# Patient Record
Sex: Female | Born: 1935 | Race: White | Hispanic: No | Marital: Married | Smoking: Never smoker
Health system: Southern US, Community
[De-identification: ages and names within clinical notes are randomized; demographics above are authoritative.]

## PROBLEM LIST (undated history)

## (undated) DIAGNOSIS — I341 Nonrheumatic mitral (valve) prolapse: Secondary | ICD-10-CM

## (undated) DIAGNOSIS — I499 Cardiac arrhythmia, unspecified: Secondary | ICD-10-CM

## (undated) DIAGNOSIS — E785 Hyperlipidemia, unspecified: Secondary | ICD-10-CM

## (undated) DIAGNOSIS — K5790 Diverticulosis of intestine, part unspecified, without perforation or abscess without bleeding: Secondary | ICD-10-CM

## (undated) DIAGNOSIS — K5792 Diverticulitis of intestine, part unspecified, without perforation or abscess without bleeding: Secondary | ICD-10-CM

## (undated) DIAGNOSIS — M1990 Unspecified osteoarthritis, unspecified site: Secondary | ICD-10-CM

## (undated) DIAGNOSIS — I480 Paroxysmal atrial fibrillation: Secondary | ICD-10-CM

## (undated) HISTORY — PX: CATARACT EXTRACTION, BILATERAL: SHX1313

## (undated) HISTORY — DX: Paroxysmal atrial fibrillation: I48.0

## (undated) HISTORY — PX: TRANSTHORACIC ECHOCARDIOGRAM: SHX275

---

## 1993-01-04 DIAGNOSIS — K5792 Diverticulitis of intestine, part unspecified, without perforation or abscess without bleeding: Secondary | ICD-10-CM

## 1993-01-04 HISTORY — DX: Diverticulitis of intestine, part unspecified, without perforation or abscess without bleeding: K57.92

## 1997-07-19 ENCOUNTER — Encounter: Admission: RE | Admit: 1997-07-19 | Discharge: 1997-10-17 | Payer: Self-pay | Admitting: Orthopaedic Surgery

## 1997-10-23 ENCOUNTER — Encounter: Admission: RE | Admit: 1997-10-23 | Discharge: 1998-01-21 | Payer: Self-pay | Admitting: Orthopaedic Surgery

## 1998-05-16 ENCOUNTER — Other Ambulatory Visit: Admission: RE | Admit: 1998-05-16 | Discharge: 1998-05-16 | Payer: Self-pay | Admitting: *Deleted

## 1999-05-19 ENCOUNTER — Other Ambulatory Visit: Admission: RE | Admit: 1999-05-19 | Discharge: 1999-05-19 | Payer: Self-pay | Admitting: *Deleted

## 2001-01-16 ENCOUNTER — Other Ambulatory Visit: Admission: RE | Admit: 2001-01-16 | Discharge: 2001-01-16 | Payer: Self-pay

## 2001-01-18 ENCOUNTER — Encounter: Admission: RE | Admit: 2001-01-18 | Discharge: 2001-01-18 | Payer: Self-pay

## 2001-03-08 ENCOUNTER — Encounter: Admission: RE | Admit: 2001-03-08 | Discharge: 2001-03-08 | Payer: Self-pay | Admitting: Obstetrics and Gynecology

## 2001-03-08 ENCOUNTER — Encounter: Payer: Self-pay | Admitting: Obstetrics and Gynecology

## 2003-01-07 ENCOUNTER — Emergency Department (HOSPITAL_COMMUNITY): Admission: EM | Admit: 2003-01-07 | Discharge: 2003-01-07 | Payer: Self-pay | Admitting: Family Medicine

## 2003-03-28 ENCOUNTER — Emergency Department (HOSPITAL_COMMUNITY): Admission: AD | Admit: 2003-03-28 | Discharge: 2003-03-28 | Payer: Self-pay | Admitting: Family Medicine

## 2003-09-17 ENCOUNTER — Other Ambulatory Visit: Admission: RE | Admit: 2003-09-17 | Discharge: 2003-09-17 | Payer: Self-pay | Admitting: Family

## 2004-01-26 ENCOUNTER — Emergency Department (HOSPITAL_COMMUNITY): Admission: EM | Admit: 2004-01-26 | Discharge: 2004-01-26 | Payer: Self-pay | Admitting: Family Medicine

## 2004-03-01 ENCOUNTER — Emergency Department (HOSPITAL_COMMUNITY): Admission: EM | Admit: 2004-03-01 | Discharge: 2004-03-01 | Payer: Self-pay | Admitting: Family Medicine

## 2004-07-31 ENCOUNTER — Emergency Department (HOSPITAL_COMMUNITY): Admission: EM | Admit: 2004-07-31 | Discharge: 2004-07-31 | Payer: Self-pay | Admitting: Family Medicine

## 2004-12-27 ENCOUNTER — Emergency Department (HOSPITAL_COMMUNITY): Admission: EM | Admit: 2004-12-27 | Discharge: 2004-12-27 | Payer: Self-pay | Admitting: Emergency Medicine

## 2005-01-30 ENCOUNTER — Emergency Department (HOSPITAL_COMMUNITY): Admission: EM | Admit: 2005-01-30 | Discharge: 2005-01-30 | Payer: Self-pay | Admitting: Emergency Medicine

## 2005-05-15 ENCOUNTER — Emergency Department (HOSPITAL_COMMUNITY): Admission: EM | Admit: 2005-05-15 | Discharge: 2005-05-15 | Payer: Self-pay | Admitting: Family Medicine

## 2007-04-30 ENCOUNTER — Emergency Department (HOSPITAL_COMMUNITY): Admission: EM | Admit: 2007-04-30 | Discharge: 2007-04-30 | Payer: Self-pay | Admitting: Family Medicine

## 2008-05-02 ENCOUNTER — Emergency Department (HOSPITAL_COMMUNITY): Admission: EM | Admit: 2008-05-02 | Discharge: 2008-05-02 | Payer: Self-pay | Admitting: Emergency Medicine

## 2008-07-21 ENCOUNTER — Emergency Department (HOSPITAL_COMMUNITY): Admission: EM | Admit: 2008-07-21 | Discharge: 2008-07-21 | Payer: Self-pay | Admitting: Emergency Medicine

## 2008-11-29 ENCOUNTER — Emergency Department (HOSPITAL_COMMUNITY): Admission: EM | Admit: 2008-11-29 | Discharge: 2008-11-29 | Payer: Self-pay | Admitting: Family Medicine

## 2010-04-07 ENCOUNTER — Encounter (HOSPITAL_COMMUNITY): Payer: Self-pay | Admitting: Radiology

## 2010-04-07 ENCOUNTER — Emergency Department (HOSPITAL_COMMUNITY)
Admission: EM | Admit: 2010-04-07 | Discharge: 2010-04-07 | Disposition: A | Discharge status: Home / Self Care | Payer: Medicare Other | Attending: Emergency Medicine | Admitting: Emergency Medicine

## 2010-04-07 ENCOUNTER — Emergency Department (HOSPITAL_COMMUNITY): Payer: Medicare Other

## 2010-04-07 DIAGNOSIS — N200 Calculus of kidney: Secondary | ICD-10-CM | POA: Insufficient documentation

## 2010-04-07 DIAGNOSIS — E7800 Pure hypercholesterolemia, unspecified: Secondary | ICD-10-CM | POA: Insufficient documentation

## 2010-04-07 DIAGNOSIS — R109 Unspecified abdominal pain: Secondary | ICD-10-CM | POA: Insufficient documentation

## 2010-04-07 HISTORY — DX: Diverticulitis of intestine, part unspecified, without perforation or abscess without bleeding: K57.92

## 2010-04-07 LAB — URINE MICROSCOPIC-ADD ON

## 2010-04-07 LAB — URINALYSIS, ROUTINE W REFLEX MICROSCOPIC
Leukocytes, UA: NEGATIVE
Nitrite: NEGATIVE
Specific Gravity, Urine: 1.018 (ref 1.005–1.030)
Urobilinogen, UA: 0.2 mg/dL (ref 0.0–1.0)

## 2010-04-08 LAB — URINE CULTURE
Colony Count: 25000
Culture  Setup Time: 201204030936

## 2010-04-12 LAB — POCT URINALYSIS DIP (DEVICE)
Glucose, UA: NEGATIVE mg/dL
Nitrite: NEGATIVE
Urobilinogen, UA: 0.2 mg/dL (ref 0.0–1.0)

## 2010-04-15 LAB — COMPREHENSIVE METABOLIC PANEL
AST: 33 U/L (ref 0–37)
Albumin: 3.6 g/dL (ref 3.5–5.2)
Calcium: 9.7 mg/dL (ref 8.4–10.5)
Chloride: 105 mEq/L (ref 96–112)
Creatinine, Ser: 0.78 mg/dL (ref 0.4–1.2)
GFR calc Af Amer: >60 mL/min (ref >60)
Total Protein: 6 g/dL (ref 6.0–8.3)

## 2010-04-15 LAB — DIFFERENTIAL
Eosinophils Relative: 1 % (ref 0–5)
Lymphocytes Relative: 14 % (ref 12–46)
Lymphs Abs: 0.9 10*3/uL (ref 0.7–4.0)
Monocytes Absolute: 0.6 10*3/uL (ref 0.1–1.0)
Monocytes Relative: 9 % (ref 3–12)

## 2010-04-15 LAB — CBC
MCV: 93 fL (ref 78.0–100.0)
Platelets: 178 10*3/uL (ref 150–400)
WBC: 6.5 10*3/uL (ref 4.0–10.5)

## 2010-04-15 LAB — URINALYSIS, ROUTINE W REFLEX MICROSCOPIC
Hgb urine dipstick: NEGATIVE
Protein, ur: NEGATIVE mg/dL
Urobilinogen, UA: 0.2 mg/dL (ref 0.0–1.0)

## 2011-01-11 DIAGNOSIS — E559 Vitamin D deficiency, unspecified: Secondary | ICD-10-CM | POA: Diagnosis not present

## 2011-01-11 DIAGNOSIS — Z0000 Encounter for general adult medical examination without abnormal findings: Secondary | ICD-10-CM | POA: Diagnosis not present

## 2011-01-11 DIAGNOSIS — E785 Hyperlipidemia, unspecified: Secondary | ICD-10-CM | POA: Diagnosis not present

## 2011-01-11 DIAGNOSIS — Z1331 Encounter for screening for depression: Secondary | ICD-10-CM | POA: Diagnosis not present

## 2011-03-24 DIAGNOSIS — L0291 Cutaneous abscess, unspecified: Secondary | ICD-10-CM | POA: Diagnosis not present

## 2011-04-21 DIAGNOSIS — H04129 Dry eye syndrome of unspecified lacrimal gland: Secondary | ICD-10-CM | POA: Diagnosis not present

## 2011-04-21 DIAGNOSIS — H43819 Vitreous degeneration, unspecified eye: Secondary | ICD-10-CM | POA: Diagnosis not present

## 2011-04-21 DIAGNOSIS — H259 Unspecified age-related cataract: Secondary | ICD-10-CM | POA: Diagnosis not present

## 2011-04-21 DIAGNOSIS — H02059 Trichiasis without entropian unspecified eye, unspecified eyelid: Secondary | ICD-10-CM | POA: Diagnosis not present

## 2011-06-15 DIAGNOSIS — Z1231 Encounter for screening mammogram for malignant neoplasm of breast: Secondary | ICD-10-CM | POA: Diagnosis not present

## 2011-06-17 DIAGNOSIS — R928 Other abnormal and inconclusive findings on diagnostic imaging of breast: Secondary | ICD-10-CM | POA: Diagnosis not present

## 2011-08-20 DIAGNOSIS — S92309A Fracture of unspecified metatarsal bone(s), unspecified foot, initial encounter for closed fracture: Secondary | ICD-10-CM | POA: Diagnosis not present

## 2011-08-27 DIAGNOSIS — S92309A Fracture of unspecified metatarsal bone(s), unspecified foot, initial encounter for closed fracture: Secondary | ICD-10-CM | POA: Diagnosis not present

## 2011-09-17 DIAGNOSIS — S92309A Fracture of unspecified metatarsal bone(s), unspecified foot, initial encounter for closed fracture: Secondary | ICD-10-CM | POA: Diagnosis not present

## 2011-09-23 DIAGNOSIS — Z23 Encounter for immunization: Secondary | ICD-10-CM | POA: Diagnosis not present

## 2011-10-12 DIAGNOSIS — S92309A Fracture of unspecified metatarsal bone(s), unspecified foot, initial encounter for closed fracture: Secondary | ICD-10-CM | POA: Diagnosis not present

## 2011-11-15 DIAGNOSIS — S92309A Fracture of unspecified metatarsal bone(s), unspecified foot, initial encounter for closed fracture: Secondary | ICD-10-CM | POA: Diagnosis not present

## 2012-01-10 DIAGNOSIS — N6009 Solitary cyst of unspecified breast: Secondary | ICD-10-CM | POA: Diagnosis not present

## 2012-01-17 DIAGNOSIS — E559 Vitamin D deficiency, unspecified: Secondary | ICD-10-CM | POA: Diagnosis not present

## 2012-01-17 DIAGNOSIS — H259 Unspecified age-related cataract: Secondary | ICD-10-CM | POA: Diagnosis not present

## 2012-01-17 DIAGNOSIS — M810 Age-related osteoporosis without current pathological fracture: Secondary | ICD-10-CM | POA: Diagnosis not present

## 2012-01-17 DIAGNOSIS — H52209 Unspecified astigmatism, unspecified eye: Secondary | ICD-10-CM | POA: Diagnosis not present

## 2012-01-17 DIAGNOSIS — H04129 Dry eye syndrome of unspecified lacrimal gland: Secondary | ICD-10-CM | POA: Diagnosis not present

## 2012-01-17 DIAGNOSIS — H43819 Vitreous degeneration, unspecified eye: Secondary | ICD-10-CM | POA: Diagnosis not present

## 2012-01-17 DIAGNOSIS — Z0000 Encounter for general adult medical examination without abnormal findings: Secondary | ICD-10-CM | POA: Diagnosis not present

## 2012-01-17 DIAGNOSIS — Z1331 Encounter for screening for depression: Secondary | ICD-10-CM | POA: Diagnosis not present

## 2012-01-17 DIAGNOSIS — E785 Hyperlipidemia, unspecified: Secondary | ICD-10-CM | POA: Diagnosis not present

## 2012-02-01 DIAGNOSIS — H25049 Posterior subcapsular polar age-related cataract, unspecified eye: Secondary | ICD-10-CM | POA: Diagnosis not present

## 2012-02-01 DIAGNOSIS — H269 Unspecified cataract: Secondary | ICD-10-CM | POA: Diagnosis not present

## 2012-02-01 DIAGNOSIS — H25019 Cortical age-related cataract, unspecified eye: Secondary | ICD-10-CM | POA: Diagnosis not present

## 2012-02-01 DIAGNOSIS — H2510 Age-related nuclear cataract, unspecified eye: Secondary | ICD-10-CM | POA: Diagnosis not present

## 2012-02-28 DIAGNOSIS — M810 Age-related osteoporosis without current pathological fracture: Secondary | ICD-10-CM | POA: Diagnosis not present

## 2012-03-07 DIAGNOSIS — H52209 Unspecified astigmatism, unspecified eye: Secondary | ICD-10-CM | POA: Diagnosis not present

## 2012-03-07 DIAGNOSIS — H269 Unspecified cataract: Secondary | ICD-10-CM | POA: Diagnosis not present

## 2012-03-07 DIAGNOSIS — H25049 Posterior subcapsular polar age-related cataract, unspecified eye: Secondary | ICD-10-CM | POA: Diagnosis not present

## 2012-03-07 DIAGNOSIS — H25039 Anterior subcapsular polar age-related cataract, unspecified eye: Secondary | ICD-10-CM | POA: Diagnosis not present

## 2012-03-07 DIAGNOSIS — H2510 Age-related nuclear cataract, unspecified eye: Secondary | ICD-10-CM | POA: Diagnosis not present

## 2012-04-04 DIAGNOSIS — M76899 Other specified enthesopathies of unspecified lower limb, excluding foot: Secondary | ICD-10-CM | POA: Diagnosis not present

## 2012-07-04 DIAGNOSIS — L821 Other seborrheic keratosis: Secondary | ICD-10-CM | POA: Diagnosis not present

## 2012-08-10 DIAGNOSIS — M47812 Spondylosis without myelopathy or radiculopathy, cervical region: Secondary | ICD-10-CM | POA: Diagnosis not present

## 2012-08-10 DIAGNOSIS — M542 Cervicalgia: Secondary | ICD-10-CM | POA: Diagnosis not present

## 2012-08-16 DIAGNOSIS — M47817 Spondylosis without myelopathy or radiculopathy, lumbosacral region: Secondary | ICD-10-CM | POA: Diagnosis not present

## 2012-08-16 DIAGNOSIS — M47812 Spondylosis without myelopathy or radiculopathy, cervical region: Secondary | ICD-10-CM | POA: Diagnosis not present

## 2012-08-21 ENCOUNTER — Encounter (HOSPITAL_COMMUNITY): Payer: Self-pay

## 2012-08-21 ENCOUNTER — Emergency Department (HOSPITAL_COMMUNITY)
Admission: EM | Admit: 2012-08-21 | Discharge: 2012-08-22 | Disposition: A | Discharge status: Home / Self Care | Payer: Medicare Other | Attending: Emergency Medicine | Admitting: Emergency Medicine

## 2012-08-21 DIAGNOSIS — R112 Nausea with vomiting, unspecified: Secondary | ICD-10-CM | POA: Diagnosis not present

## 2012-08-21 DIAGNOSIS — R197 Diarrhea, unspecified: Secondary | ICD-10-CM | POA: Insufficient documentation

## 2012-08-21 DIAGNOSIS — Z8719 Personal history of other diseases of the digestive system: Secondary | ICD-10-CM | POA: Insufficient documentation

## 2012-08-21 DIAGNOSIS — Z79899 Other long term (current) drug therapy: Secondary | ICD-10-CM | POA: Diagnosis not present

## 2012-08-21 DIAGNOSIS — R1110 Vomiting, unspecified: Secondary | ICD-10-CM

## 2012-08-21 DIAGNOSIS — R109 Unspecified abdominal pain: Secondary | ICD-10-CM | POA: Insufficient documentation

## 2012-08-21 DIAGNOSIS — Z7982 Long term (current) use of aspirin: Secondary | ICD-10-CM | POA: Insufficient documentation

## 2012-08-21 NOTE — ED Notes (Signed)
Pt complains of feeling nauseated around 730 and vomiting about 12 times since then and she states that she's had some diarrhea

## 2012-08-22 LAB — CBC WITH DIFFERENTIAL/PLATELET
Basophils Absolute: 0 10*3/uL (ref 0.0–0.1)
Basophils Relative: 0 % (ref 0–1)
Eosinophils Relative: 1 % (ref 0–5)
HCT: 41.4 % (ref 36.0–46.0)
MCHC: 34.3 g/dL (ref 30.0–36.0)
MCV: 93.5 fL (ref 78.0–100.0)
Monocytes Absolute: 0.5 10*3/uL (ref 0.1–1.0)
RDW: 12.8 % (ref 11.5–15.5)

## 2012-08-22 LAB — URINE MICROSCOPIC-ADD ON

## 2012-08-22 LAB — COMPREHENSIVE METABOLIC PANEL
AST: 36 U/L (ref 0–37)
Albumin: 3.8 g/dL (ref 3.5–5.2)
BUN: 28 mg/dL — ABNORMAL HIGH (ref 6–23)
CO2: 27 mEq/L (ref 19–32)
Calcium: 9.6 mg/dL (ref 8.4–10.5)
Creatinine, Ser: 0.83 mg/dL (ref 0.50–1.10)
GFR calc non Af Amer: 63 mL/min — ABNORMAL LOW (ref >90)

## 2012-08-22 LAB — URINALYSIS, ROUTINE W REFLEX MICROSCOPIC
Glucose, UA: NEGATIVE mg/dL
Nitrite: NEGATIVE
Protein, ur: NEGATIVE mg/dL

## 2012-08-22 LAB — LIPASE, BLOOD: Lipase: 84 U/L — ABNORMAL HIGH (ref 11–59)

## 2012-08-22 MED ORDER — DIPHENOXYLATE-ATROPINE 2.5-0.025 MG PO TABS
1.0000 | ORAL_TABLET | Freq: Once | ORAL | Status: AC
  Administered 2012-08-22: 1 via ORAL
  Filled 2012-08-22: qty 1

## 2012-08-22 MED ORDER — LOPERAMIDE HCL 2 MG PO CAPS: 2.0000 mg | ORAL_CAPSULE | Freq: Four times a day (QID) | ORAL | Status: DC | PRN

## 2012-08-22 MED ORDER — SODIUM CHLORIDE 0.9 % IV BOLUS (SEPSIS)
500.0000 mL | Freq: Once | INTRAVENOUS | Status: AC
  Administered 2012-08-22: 500 mL via INTRAVENOUS

## 2012-08-22 MED ORDER — ONDANSETRON HCL 4 MG/2ML IJ SOLN
4.0000 mg | Freq: Once | INTRAMUSCULAR | Status: AC
  Administered 2012-08-22: 4 mg via INTRAVENOUS
  Filled 2012-08-22: qty 2

## 2012-08-22 MED ORDER — ONDANSETRON HCL 4 MG PO TABS: 4.0000 mg | ORAL_TABLET | Freq: Four times a day (QID) | ORAL | Status: DC

## 2012-08-22 NOTE — ED Provider Notes (Signed)
CSN: [id number]     Arrival date & time 08/21/12  2336 History     First MD Initiated Contact with Patient 08/21/12 2354     Chief Complaint  Patient presents with  . Emesis   (Consider location/radiation/quality/duration/timing/severity/associated sxs/prior Treatment) HPI Comments: Patient comes to the ER for evaluation of nausea, vomiting and diarrhea. Patient reports onset of nausea and vomiting earlier today. She has vomited approximately 14 times. Patient also reports that she had onset of watery diarrhea this evening. Patient reports lower abdominal cramping prior to diarrhea, but the pain was relieved after the bowel movement. Currently no pain. She has not been able to eat or drink anything because of the nausea and vomiting. She has not had any fever.  Patient is a 79 y.o. female presenting with vomiting.  Emesis Associated symptoms: abdominal pain (resolved) and diarrhea     Past Medical History  Diagnosis Date  . Diverticulitis    History reviewed. No pertinent past surgical history. History reviewed. No pertinent family history. History  Substance Use Topics  . Smoking status: Never Smoker   . Smokeless tobacco: Not on file  . Alcohol Use: No   OB History   Grav Para Term Preterm Abortions TAB SAB Ect Mult Living                 Review of Systems  Constitutional: Negative for fever.  Gastrointestinal: Positive for nausea, vomiting, abdominal pain (resolved) and diarrhea.  All other systems reviewed and are negative.    Allergies  Amoxicillin; Ciprofloxacin; and Sulfa antibiotics  Home Medications   Current Outpatient Rx  Name  Route  Sig  Dispense  Refill  . aspirin 81 MG tablet   Oral   Take 81 mg by mouth daily.         . calcium carbonate (TUMS - DOSED IN MG ELEMENTAL CALCIUM) 500 MG chewable tablet   Oral   Chew 1 tablet by mouth daily.         . rosuvastatin (CRESTOR) 10 MG tablet   Oral   Take 10 mg by mouth daily.          BP  139/62  Pulse 73  Temp(Src) 98.4 F (36.9 C) (Oral)  Resp 20  Ht 5\' 3"  (1.6 m)  Wt 115 lb (52.164 kg)  BMI 20.38 kg/m2  SpO2 96% Physical Exam  Constitutional: She is oriented to person, place, and time. She appears well-developed and well-nourished. No distress.  HENT:  Head: Normocephalic and atraumatic.  Right Ear: Hearing normal.  Left Ear: Hearing normal.  Nose: Nose normal.  Mouth/Throat: Oropharynx is clear and moist and mucous membranes are normal.  Eyes: Conjunctivae and EOM are normal. Pupils are equal, round, and reactive to light.  Neck: Normal range of motion. Neck supple.  Cardiovascular: Regular rhythm, S1 normal and S2 normal.  Exam reveals no gallop and no friction rub.   No murmur heard. Pulmonary/Chest: Effort normal and breath sounds normal. No respiratory distress. She exhibits no tenderness.  Abdominal: Soft. Normal appearance and bowel sounds are normal. There is no hepatosplenomegaly. There is no tenderness. There is no rebound, no guarding, no tenderness at McBurney's point and negative Murphy's sign. No hernia.  Musculoskeletal: Normal range of motion.  Neurological: She is alert and oriented to person, place, and time. She has normal strength. No cranial nerve deficit or sensory deficit. Coordination normal. GCS eye subscore is 4. GCS verbal subscore is 5. GCS motor subscore is 6.  Skin: Skin is warm, dry and intact. No rash noted. No cyanosis.  Psychiatric: She has a normal mood and affect. Her speech is normal and behavior is normal. Thought content normal.    ED Course   Procedures (including critical care time)  Labs Reviewed  CBC WITH DIFFERENTIAL  COMPREHENSIVE METABOLIC PANEL  LIPASE, BLOOD  URINALYSIS, ROUTINE W REFLEX MICROSCOPIC   No results found.  Diagnosis: 1. Nausea and vomiting 2. Diarrhea  MDM  Patient presented with nausea, vomiting and diarrhea that began earlier today. She has an entirely benign, nontender abdominal exam. Lab  work is unremarkable. No suspicion for acute surgical process. Patient can be hydrated and administered medication with improvement. Patient will be discharged with continued symptomatic treatment, he'll primary doctor. Return to the ER if symptoms worsen.  Gilda Crease, MD 08/22/12 [phone]

## 2012-08-22 NOTE — ED Notes (Signed)
Pt is awake and alert, pleasant and cooperative. Patient denies pain, N/V Discharge vitals 126/50 HR 69 RR 16 and unlabored. Pt is advised to follow-up with PCP. Will continue to monitor for safety. Patient escorted to lobby without incident. T.Melvyn Neth RN

## 2012-08-24 DIAGNOSIS — R112 Nausea with vomiting, unspecified: Secondary | ICD-10-CM | POA: Diagnosis not present

## 2012-08-24 DIAGNOSIS — R197 Diarrhea, unspecified: Secondary | ICD-10-CM | POA: Diagnosis not present

## 2012-09-26 DIAGNOSIS — Z23 Encounter for immunization: Secondary | ICD-10-CM | POA: Diagnosis not present

## 2012-10-18 DIAGNOSIS — N949 Unspecified condition associated with female genital organs and menstrual cycle: Secondary | ICD-10-CM | POA: Diagnosis not present

## 2013-01-16 DIAGNOSIS — Z1231 Encounter for screening mammogram for malignant neoplasm of breast: Secondary | ICD-10-CM | POA: Diagnosis not present

## 2013-01-26 DIAGNOSIS — L659 Nonscarring hair loss, unspecified: Secondary | ICD-10-CM | POA: Diagnosis not present

## 2013-01-26 DIAGNOSIS — L989 Disorder of the skin and subcutaneous tissue, unspecified: Secondary | ICD-10-CM | POA: Diagnosis not present

## 2013-01-26 DIAGNOSIS — Z79899 Other long term (current) drug therapy: Secondary | ICD-10-CM | POA: Diagnosis not present

## 2013-01-26 DIAGNOSIS — E785 Hyperlipidemia, unspecified: Secondary | ICD-10-CM | POA: Diagnosis not present

## 2013-01-26 DIAGNOSIS — Z1331 Encounter for screening for depression: Secondary | ICD-10-CM | POA: Diagnosis not present

## 2013-02-05 DIAGNOSIS — J069 Acute upper respiratory infection, unspecified: Secondary | ICD-10-CM | POA: Diagnosis not present

## 2013-04-27 DIAGNOSIS — H52209 Unspecified astigmatism, unspecified eye: Secondary | ICD-10-CM | POA: Diagnosis not present

## 2013-04-27 DIAGNOSIS — Z961 Presence of intraocular lens: Secondary | ICD-10-CM | POA: Diagnosis not present

## 2013-04-27 DIAGNOSIS — H2640 Unspecified secondary cataract: Secondary | ICD-10-CM | POA: Diagnosis not present

## 2013-04-27 DIAGNOSIS — H43819 Vitreous degeneration, unspecified eye: Secondary | ICD-10-CM | POA: Diagnosis not present

## 2013-09-24 DIAGNOSIS — N952 Postmenopausal atrophic vaginitis: Secondary | ICD-10-CM | POA: Diagnosis not present

## 2013-09-24 DIAGNOSIS — N362 Urethral caruncle: Secondary | ICD-10-CM | POA: Diagnosis not present

## 2013-09-24 DIAGNOSIS — N950 Postmenopausal bleeding: Secondary | ICD-10-CM | POA: Diagnosis not present

## 2013-10-01 DIAGNOSIS — N952 Postmenopausal atrophic vaginitis: Secondary | ICD-10-CM | POA: Diagnosis not present

## 2013-10-01 DIAGNOSIS — N950 Postmenopausal bleeding: Secondary | ICD-10-CM | POA: Diagnosis not present

## 2013-11-09 DIAGNOSIS — R197 Diarrhea, unspecified: Secondary | ICD-10-CM | POA: Diagnosis not present

## 2013-11-09 DIAGNOSIS — K5792 Diverticulitis of intestine, part unspecified, without perforation or abscess without bleeding: Secondary | ICD-10-CM | POA: Diagnosis not present

## 2013-11-26 DIAGNOSIS — N952 Postmenopausal atrophic vaginitis: Secondary | ICD-10-CM | POA: Diagnosis not present

## 2014-02-08 DIAGNOSIS — Z803 Family history of malignant neoplasm of breast: Secondary | ICD-10-CM | POA: Diagnosis not present

## 2014-02-08 DIAGNOSIS — Z1231 Encounter for screening mammogram for malignant neoplasm of breast: Secondary | ICD-10-CM | POA: Diagnosis not present

## 2014-02-25 DIAGNOSIS — Z23 Encounter for immunization: Secondary | ICD-10-CM | POA: Diagnosis not present

## 2014-02-25 DIAGNOSIS — I499 Cardiac arrhythmia, unspecified: Secondary | ICD-10-CM | POA: Diagnosis not present

## 2014-02-25 DIAGNOSIS — Z1389 Encounter for screening for other disorder: Secondary | ICD-10-CM | POA: Diagnosis not present

## 2014-02-25 DIAGNOSIS — E780 Pure hypercholesterolemia: Secondary | ICD-10-CM | POA: Diagnosis not present

## 2014-02-25 DIAGNOSIS — M810 Age-related osteoporosis without current pathological fracture: Secondary | ICD-10-CM | POA: Diagnosis not present

## 2014-02-25 DIAGNOSIS — Z0000 Encounter for general adult medical examination without abnormal findings: Secondary | ICD-10-CM | POA: Diagnosis not present

## 2014-06-07 DIAGNOSIS — Z961 Presence of intraocular lens: Secondary | ICD-10-CM | POA: Diagnosis not present

## 2014-06-28 DIAGNOSIS — L570 Actinic keratosis: Secondary | ICD-10-CM | POA: Diagnosis not present

## 2014-06-28 DIAGNOSIS — X32XXXD Exposure to sunlight, subsequent encounter: Secondary | ICD-10-CM | POA: Diagnosis not present

## 2014-06-28 DIAGNOSIS — D0462 Carcinoma in situ of skin of left upper limb, including shoulder: Secondary | ICD-10-CM | POA: Diagnosis not present

## 2014-06-28 DIAGNOSIS — L821 Other seborrheic keratosis: Secondary | ICD-10-CM | POA: Diagnosis not present

## 2014-08-23 ENCOUNTER — Ambulatory Visit
Admission: RE | Admit: 2014-08-23 | Discharge: 2014-08-23 | Disposition: A | Discharge status: Home / Self Care | Payer: Medicare Other | Source: Ambulatory Visit | Attending: Internal Medicine | Admitting: Internal Medicine

## 2014-08-23 ENCOUNTER — Other Ambulatory Visit: Payer: Self-pay | Admitting: Internal Medicine

## 2014-08-23 DIAGNOSIS — R109 Unspecified abdominal pain: Principal | ICD-10-CM

## 2014-08-23 DIAGNOSIS — I700 Atherosclerosis of aorta: Secondary | ICD-10-CM | POA: Diagnosis not present

## 2014-08-23 DIAGNOSIS — R1030 Lower abdominal pain, unspecified: Secondary | ICD-10-CM | POA: Diagnosis not present

## 2014-08-23 DIAGNOSIS — G8929 Other chronic pain: Secondary | ICD-10-CM

## 2014-08-23 DIAGNOSIS — K5900 Constipation, unspecified: Secondary | ICD-10-CM | POA: Diagnosis not present

## 2014-08-23 DIAGNOSIS — R110 Nausea: Secondary | ICD-10-CM | POA: Diagnosis not present

## 2014-10-17 DIAGNOSIS — Z85828 Personal history of other malignant neoplasm of skin: Secondary | ICD-10-CM | POA: Diagnosis not present

## 2014-10-17 DIAGNOSIS — Z08 Encounter for follow-up examination after completed treatment for malignant neoplasm: Secondary | ICD-10-CM | POA: Diagnosis not present

## 2014-10-17 DIAGNOSIS — D0461 Carcinoma in situ of skin of right upper limb, including shoulder: Secondary | ICD-10-CM | POA: Diagnosis not present

## 2014-11-14 DIAGNOSIS — S29011A Strain of muscle and tendon of front wall of thorax, initial encounter: Secondary | ICD-10-CM | POA: Diagnosis not present

## 2014-12-02 DIAGNOSIS — N952 Postmenopausal atrophic vaginitis: Secondary | ICD-10-CM | POA: Diagnosis not present

## 2014-12-02 DIAGNOSIS — Z01419 Encounter for gynecological examination (general) (routine) without abnormal findings: Secondary | ICD-10-CM | POA: Diagnosis not present

## 2015-02-12 DIAGNOSIS — L820 Inflamed seborrheic keratosis: Secondary | ICD-10-CM | POA: Diagnosis not present

## 2015-02-12 DIAGNOSIS — X32XXXD Exposure to sunlight, subsequent encounter: Secondary | ICD-10-CM | POA: Diagnosis not present

## 2015-02-12 DIAGNOSIS — D225 Melanocytic nevi of trunk: Secondary | ICD-10-CM | POA: Diagnosis not present

## 2015-02-12 DIAGNOSIS — L570 Actinic keratosis: Secondary | ICD-10-CM | POA: Diagnosis not present

## 2015-02-27 DIAGNOSIS — R636 Underweight: Secondary | ICD-10-CM | POA: Diagnosis not present

## 2015-02-27 DIAGNOSIS — Z1389 Encounter for screening for other disorder: Secondary | ICD-10-CM | POA: Diagnosis not present

## 2015-02-27 DIAGNOSIS — E7800 Pure hypercholesterolemia, unspecified: Secondary | ICD-10-CM | POA: Diagnosis not present

## 2015-02-27 DIAGNOSIS — M810 Age-related osteoporosis without current pathological fracture: Secondary | ICD-10-CM | POA: Diagnosis not present

## 2015-03-11 DIAGNOSIS — Z1231 Encounter for screening mammogram for malignant neoplasm of breast: Secondary | ICD-10-CM | POA: Diagnosis not present

## 2015-03-13 DIAGNOSIS — Z1239 Encounter for other screening for malignant neoplasm of breast: Secondary | ICD-10-CM | POA: Diagnosis not present

## 2015-03-13 DIAGNOSIS — Z1231 Encounter for screening mammogram for malignant neoplasm of breast: Secondary | ICD-10-CM | POA: Diagnosis not present

## 2015-03-13 DIAGNOSIS — R922 Inconclusive mammogram: Secondary | ICD-10-CM | POA: Diagnosis not present

## 2015-04-03 DIAGNOSIS — M810 Age-related osteoporosis without current pathological fracture: Secondary | ICD-10-CM | POA: Diagnosis not present

## 2015-04-10 DIAGNOSIS — M810 Age-related osteoporosis without current pathological fracture: Secondary | ICD-10-CM | POA: Diagnosis not present

## 2015-05-08 DIAGNOSIS — H5310 Unspecified subjective visual disturbances: Secondary | ICD-10-CM | POA: Diagnosis not present

## 2015-06-09 DIAGNOSIS — H26493 Other secondary cataract, bilateral: Secondary | ICD-10-CM | POA: Diagnosis not present

## 2015-06-09 DIAGNOSIS — Z0100 Encounter for examination of eyes and vision without abnormal findings: Secondary | ICD-10-CM | POA: Diagnosis not present

## 2015-06-09 DIAGNOSIS — H5310 Unspecified subjective visual disturbances: Secondary | ICD-10-CM | POA: Diagnosis not present

## 2015-06-09 DIAGNOSIS — Z961 Presence of intraocular lens: Secondary | ICD-10-CM | POA: Diagnosis not present

## 2015-10-01 DIAGNOSIS — H938X1 Other specified disorders of right ear: Secondary | ICD-10-CM | POA: Diagnosis not present

## 2015-10-01 DIAGNOSIS — S81811A Laceration without foreign body, right lower leg, initial encounter: Secondary | ICD-10-CM | POA: Diagnosis not present

## 2015-12-10 DIAGNOSIS — L570 Actinic keratosis: Secondary | ICD-10-CM | POA: Diagnosis not present

## 2015-12-10 DIAGNOSIS — X32XXXD Exposure to sunlight, subsequent encounter: Secondary | ICD-10-CM | POA: Diagnosis not present

## 2016-02-20 ENCOUNTER — Emergency Department (HOSPITAL_COMMUNITY)
Admission: EM | Admit: 2016-02-20 | Discharge: 2016-02-20 | Disposition: A | Discharge status: Home / Self Care | Payer: Medicare Other | Attending: Emergency Medicine | Admitting: Emergency Medicine

## 2016-02-20 ENCOUNTER — Encounter (HOSPITAL_COMMUNITY): Payer: Self-pay | Admitting: Emergency Medicine

## 2016-02-20 ENCOUNTER — Emergency Department (HOSPITAL_COMMUNITY): Payer: Medicare Other

## 2016-02-20 DIAGNOSIS — Z79899 Other long term (current) drug therapy: Secondary | ICD-10-CM | POA: Diagnosis not present

## 2016-02-20 DIAGNOSIS — S0181XA Laceration without foreign body of other part of head, initial encounter: Secondary | ICD-10-CM | POA: Insufficient documentation

## 2016-02-20 DIAGNOSIS — Z7982 Long term (current) use of aspirin: Secondary | ICD-10-CM | POA: Diagnosis not present

## 2016-02-20 DIAGNOSIS — Y929 Unspecified place or not applicable: Secondary | ICD-10-CM | POA: Insufficient documentation

## 2016-02-20 DIAGNOSIS — R51 Headache: Secondary | ICD-10-CM | POA: Insufficient documentation

## 2016-02-20 DIAGNOSIS — W01198A Fall on same level from slipping, tripping and stumbling with subsequent striking against other object, initial encounter: Secondary | ICD-10-CM | POA: Diagnosis not present

## 2016-02-20 DIAGNOSIS — Y999 Unspecified external cause status: Secondary | ICD-10-CM | POA: Insufficient documentation

## 2016-02-20 DIAGNOSIS — W19XXXA Unspecified fall, initial encounter: Secondary | ICD-10-CM

## 2016-02-20 DIAGNOSIS — M542 Cervicalgia: Secondary | ICD-10-CM | POA: Diagnosis not present

## 2016-02-20 DIAGNOSIS — Y939 Activity, unspecified: Secondary | ICD-10-CM | POA: Diagnosis not present

## 2016-02-20 DIAGNOSIS — S199XXA Unspecified injury of neck, initial encounter: Secondary | ICD-10-CM | POA: Diagnosis not present

## 2016-02-20 DIAGNOSIS — S80212A Abrasion, left knee, initial encounter: Secondary | ICD-10-CM | POA: Diagnosis not present

## 2016-02-20 DIAGNOSIS — S0990XA Unspecified injury of head, initial encounter: Secondary | ICD-10-CM | POA: Diagnosis not present

## 2016-02-20 HISTORY — DX: Unspecified osteoarthritis, unspecified site: M19.90

## 2016-02-20 HISTORY — DX: Nonrheumatic mitral (valve) prolapse: I34.1

## 2016-02-20 LAB — CBC WITH DIFFERENTIAL/PLATELET
BASOS ABS: 0 10*3/uL (ref 0.0–0.1)
BASOS PCT: 1 %
EOS ABS: 0.1 10*3/uL (ref 0.0–0.7)
EOS PCT: 1 %
HCT: 41.1 % (ref 36.0–46.0)
HEMOGLOBIN: 13.7 g/dL (ref 12.0–15.0)
LYMPHS ABS: 1 10*3/uL (ref 0.7–4.0)
Lymphocytes Relative: 16 %
MCH: 30.9 pg (ref 26.0–34.0)
MCHC: 33.3 g/dL (ref 30.0–36.0)
MCV: 92.6 fL (ref 78.0–100.0)
Monocytes Absolute: 0.7 10*3/uL (ref 0.1–1.0)
Monocytes Relative: 11 %
NEUTROS PCT: 71 %
Neutro Abs: 4.6 10*3/uL (ref 1.7–7.7)
PLATELETS: 182 10*3/uL (ref 150–400)
RBC: 4.44 MIL/uL (ref 3.87–5.11)
RDW: 12.5 % (ref 11.5–15.5)
WBC: 6.4 10*3/uL (ref 4.0–10.5)

## 2016-02-20 MED ORDER — LIDOCAINE-EPINEPHRINE (PF) 2 %-1:200000 IJ SOLN
20.0000 mL | Freq: Once | INTRAMUSCULAR | Status: AC
  Administered 2016-02-20: 20 mL
  Filled 2016-02-20: qty 20

## 2016-02-20 MED ORDER — ACETAMINOPHEN 325 MG PO TABS
650.0000 mg | ORAL_TABLET | Freq: Once | ORAL | Status: AC
  Administered 2016-02-20: 650 mg via ORAL
  Filled 2016-02-20: qty 2

## 2016-02-20 NOTE — ED Triage Notes (Signed)
Pt stated that she tripped and fell on a neighbors side walk. L/knee stuck cement, abrasion noted. Face steuck cement, causing abrasions and laceration above l/eye. Pt takes one ASA 81 mg daily. Denies LOC. Denies dizziness.Eye glasses did not break on impact. Pt is alert, oriented and ambulatory. Denies pain.

## 2016-02-20 NOTE — Discharge Instructions (Signed)
Your CT scan of your neck showed abnormalities of the bone that will need to be followed up by Dr. Laurann Montana.

## 2016-02-20 NOTE — ED Notes (Signed)
Rees at bedside suturing another small area to head laceration to limit bleeding.

## 2016-02-20 NOTE — ED Notes (Signed)
Rees at bedside. 

## 2016-02-20 NOTE — ED Provider Notes (Signed)
[facility] DEPT Provider Note   CSN: [id number] Arrival date & time: 02/20/16  1126     History   Chief Complaint Chief Complaint  Patient presents with  . Fall  . Laceration    HPI Lisa Lang is a 83 y.o. female.  The history is provided by the patient. No language interpreter was used.    Lisa Lang is a 83 y.o. female who presents to the Emergency Department complaining of fall.  She was walking outside on concrete when she tripped over an uneven piece of concrete and fell, striking her left knee and then striking her face. No loss of consciousness. She was able to get back up and a bili back to her house. She reports significant bleeding from her facial wound. She takes an aspirin daily and her last dose was this morning. No additional blood thinners. She denies any headache, weakness, extremity pain, neck pain.  Past Medical History:  Diagnosis Date  . Arthritis   . Diverticulitis   . Mitral valve prolapse     There are no active problems to display for this patient.   Past Surgical History:  Procedure Laterality Date  . CATARACT EXTRACTION, BILATERAL      OB History    No data available       Home Medications    Prior to Admission medications   Medication Sig Start Date End Date Taking? Authorizing Provider  aspirin 81 MG tablet Take 81 mg by mouth daily.    Historical Provider, MD  calcium carbonate (OS-CAL - DOSED IN MG OF ELEMENTAL CALCIUM) 1250 MG tablet Take 1 tablet by mouth daily with breakfast.    Historical Provider, MD  calcium carbonate (TUMS - DOSED IN MG ELEMENTAL CALCIUM) 500 MG chewable tablet Chew 1 tablet by mouth daily.    Historical Provider, MD  cholestyramine Lucrezia Starch) 4 G packet Take 1 packet by mouth daily.    Historical Provider, MD  loperamide (IMODIUM) 2 MG capsule Take 1 capsule (2 mg total) by mouth 4 (four) times daily as needed for diarrhea or loose stools. 08/22/12   Orpah Greek, MD  Multiple  Vitamins-Minerals (MULTIVITAMIN PO) Take 1 tablet by mouth daily.    Historical Provider, MD  ondansetron (ZOFRAN) 4 MG tablet Take 1 tablet (4 mg total) by mouth every 6 (six) hours. 08/22/12   Orpah Greek, MD  rosuvastatin (CRESTOR) 10 MG tablet Take 10 mg by mouth daily.    Historical Provider, MD  Vitamin D, Ergocalciferol, (DRISDOL) 50000 UNITS CAPS capsule Take 50,000 Units by mouth every 14 (fourteen) days. Twice a month    Historical Provider, MD    Family History No family history on file.  Social History Social History  Substance Use Topics  . Smoking status: Never Smoker  . Smokeless tobacco: Never Used  . Alcohol use No     Allergies   Amoxicillin; Ciprofloxacin; and Sulfa antibiotics   Review of Systems Review of Systems  All other systems reviewed and are negative.    Physical Exam Updated Vital Signs BP 142/75 (BP Location: Right Arm)   Pulse (!) 123   Temp 97.9 F (36.6 C) (Oral)   Resp 20   Wt 105 lb (47.6 kg)   SpO2 95%   BMI 18.60 kg/m   Physical Exam  Constitutional: She is oriented to person, place, and time. She appears well-developed and well-nourished.  HENT:  Head: Normocephalic.  Irregular laceration to the left temple with maceration  Eyes: EOM are normal. Pupils are equal, round, and reactive to light.  Neck: Neck supple.  Cardiovascular: Regular rhythm.   No murmur heard. Tachycardic  Pulmonary/Chest: Effort normal and breath sounds normal. No respiratory distress.  Abdominal: Soft. There is no tenderness. There is no rebound and no guarding.  Musculoskeletal:  Abrasion over the left anterior knee with flexion and extension intact in the knee and no local tenderness.  Neurological: She is alert and oriented to person, place, and time. No cranial nerve deficit.  5 out of 5 strength in all 4 extremities.  Skin: Skin is warm and dry.  Psychiatric: She has a normal mood and affect. Her behavior is normal.  Nursing note and  vitals reviewed.    ED Treatments / Results  Labs (all labs ordered are listed, but only abnormal results are displayed) Labs Reviewed - No data to display  EKG  EKG Interpretation None       Radiology No results found.  Procedures Procedures (including critical care time) LACERATION REPAIR Performed by: Quintella Reichert Authorized by: Quintella Reichert Consent: Verbal consent obtained. Risks and benefits: risks, benefits and alternatives were discussed Consent given by: patient Patient identity confirmed: provided demographic data Prepped and Draped in normal sterile fashion Wound explored  Laceration Location: left forehead  Laceration Length: 2.5cm  No Foreign Bodies seen or palpated  Anesthesia: local infiltration  Local anesthetic: lidocaine 1% with epinephrine  Anesthetic total: 5 ml  Irrigation method: syringe Amount of cleaning: standard  Skin closure: 5-0 prolene  Number of sutures: 5  Technique: simple interrupted  Patient tolerance: Patient tolerated the procedure well with no immediate complications.   Medications Ordered in ED Medications  lidocaine-EPINEPHrine (XYLOCAINE W/EPI) 2 %-1:200000 (PF) injection 20 mL (not administered)     Initial Impression / Assessment and Plan / ED Course  I have reviewed the triage vital signs and the nursing notes.  Pertinent labs & imaging results that were available during my care of the patient were reviewed by me and considered in my medical decision making (see chart for details).     Patient here for evaluation of injuries following a mechanical fall. She had a large and irregular laceration to the left forehead that was repaired per procedure note. CT C-spine with lesions on her neck that are concerning for metastatic disease process. Patient currently is asymptomatic and is up-to-date with her preventative health screenings. Discussed with patient findings of CT scan and recommendation for close  follow-up with her PCP, she may require additional testing. D/w pt home care for her facial laceration as well as knee abrasion. Discussed return precautions. Recommend suture removal in 5-7 days.  Final Clinical Impressions(s) / ED Diagnoses   Final diagnoses:  Fall, initial encounter  Facial laceration, initial encounter    New Prescriptions New Prescriptions   No medications on file     Quintella Reichert, MD 02/20/16 1724

## 2016-02-20 NOTE — ED Notes (Signed)
Pt transported to CT ?

## 2016-02-20 NOTE — ED Notes (Signed)
Pt face, neck, hands, and arms cleaned post suturing.

## 2016-02-23 DIAGNOSIS — S0512XA Contusion of eyeball and orbital tissues, left eye, initial encounter: Secondary | ICD-10-CM | POA: Diagnosis not present

## 2016-02-23 DIAGNOSIS — R937 Abnormal findings on diagnostic imaging of other parts of musculoskeletal system: Secondary | ICD-10-CM | POA: Diagnosis not present

## 2016-02-23 DIAGNOSIS — W19XXXA Unspecified fall, initial encounter: Secondary | ICD-10-CM | POA: Diagnosis not present

## 2016-03-02 DIAGNOSIS — Z0000 Encounter for general adult medical examination without abnormal findings: Secondary | ICD-10-CM | POA: Diagnosis not present

## 2016-03-02 DIAGNOSIS — M810 Age-related osteoporosis without current pathological fracture: Secondary | ICD-10-CM | POA: Diagnosis not present

## 2016-03-02 DIAGNOSIS — E7800 Pure hypercholesterolemia, unspecified: Secondary | ICD-10-CM | POA: Diagnosis not present

## 2016-03-02 DIAGNOSIS — Z1389 Encounter for screening for other disorder: Secondary | ICD-10-CM | POA: Diagnosis not present

## 2016-05-14 DIAGNOSIS — Z1231 Encounter for screening mammogram for malignant neoplasm of breast: Secondary | ICD-10-CM | POA: Diagnosis not present

## 2016-05-18 ENCOUNTER — Emergency Department (HOSPITAL_COMMUNITY)
Admission: EM | Admit: 2016-05-18 | Discharge: 2016-05-19 | Disposition: A | Discharge status: Home / Self Care | Payer: Medicare Other | Attending: Emergency Medicine | Admitting: Emergency Medicine

## 2016-05-18 ENCOUNTER — Encounter (HOSPITAL_COMMUNITY): Payer: Self-pay

## 2016-05-18 DIAGNOSIS — Y93H2 Activity, gardening and landscaping: Secondary | ICD-10-CM | POA: Insufficient documentation

## 2016-05-18 DIAGNOSIS — Z7982 Long term (current) use of aspirin: Secondary | ICD-10-CM | POA: Insufficient documentation

## 2016-05-18 DIAGNOSIS — Y999 Unspecified external cause status: Secondary | ICD-10-CM | POA: Diagnosis not present

## 2016-05-18 DIAGNOSIS — Y92007 Garden or yard of unspecified non-institutional (private) residence as the place of occurrence of the external cause: Secondary | ICD-10-CM | POA: Diagnosis not present

## 2016-05-18 DIAGNOSIS — W268XXA Contact with other sharp object(s), not elsewhere classified, initial encounter: Secondary | ICD-10-CM | POA: Insufficient documentation

## 2016-05-18 DIAGNOSIS — Z79899 Other long term (current) drug therapy: Secondary | ICD-10-CM | POA: Insufficient documentation

## 2016-05-18 DIAGNOSIS — S81811A Laceration without foreign body, right lower leg, initial encounter: Secondary | ICD-10-CM | POA: Insufficient documentation

## 2016-05-18 NOTE — ED Notes (Signed)
Combat gauze placed per PA request

## 2016-05-18 NOTE — Discharge Instructions (Signed)
Return here as needed. Follow up with your primary doctor. Keep the area clean and dry.

## 2016-05-18 NOTE — ED Triage Notes (Signed)
PT C/O A LACERATION TO THE RIGHT LOWER LEG FROM A METAL CAN SINCE YESTERDAY. PT DENIES PAIN, BUT STS THE WOUND WILL NOT STOP BLEEDING.

## 2016-05-19 NOTE — ED Notes (Signed)
Pt ambulatory and independent at discharge.  Verbalized understanding of discharge instructions 

## 2016-05-24 NOTE — ED Provider Notes (Signed)
[facility] DEPT Provider Note   CSN: [id number] Arrival date & time: 05/18/16  2137     History   Chief Complaint Chief Complaint  Patient presents with  . Extremity Laceration    HPI Lisa Lang is a 83 y.o. female.  HPI Patient presents to the emergency department with a skin tear to the lower leg.  The patient states that it occurred yesterday while she was working in the yard.  She scraped against a metal bucket.  The patient states that he has been bleeding since that time.  She states that she had applied pressure without success stopping the bleeding.  Patient states nothing seems make the condition better or worse.  She states that she did not take any medications prior to arrival for her symptoms Past Medical History:  Diagnosis Date  . Arthritis   . Diverticulitis   . Mitral valve prolapse     There are no active problems to display for this patient.   Past Surgical History:  Procedure Laterality Date  . CATARACT EXTRACTION, BILATERAL      OB History    No data available       Home Medications    Prior to Admission medications   Medication Sig Start Date End Date Taking? Authorizing Provider  aspirin 81 MG tablet Take 81 mg by mouth daily.    [provider]  calcium carbonate (OS-CAL - DOSED IN MG OF ELEMENTAL CALCIUM) 1250 MG tablet Take 1 tablet by mouth daily with breakfast.    [provider]  calcium carbonate (TUMS - DOSED IN MG ELEMENTAL CALCIUM) 500 MG chewable tablet Chew 1 tablet by mouth daily.    [provider]  cholestyramine Lucrezia Starch) 4 G packet Take 1 packet by mouth daily.    [provider]  loperamide (IMODIUM) 2 MG capsule Take 1 capsule (2 mg total) by mouth 4 (four) times daily as needed for diarrhea or loose stools. 08/22/12   Orpah Greek, MD  Multiple Vitamins-Minerals (MULTIVITAMIN PO) Take 1 tablet by mouth daily.    [provider]  ondansetron (ZOFRAN) 4 MG tablet  Take 1 tablet (4 mg total) by mouth every 6 (six) hours. 08/22/12   Orpah Greek, MD  rosuvastatin (CRESTOR) 10 MG tablet Take 10 mg by mouth daily.    [provider]  Vitamin D, Ergocalciferol, (DRISDOL) 50000 UNITS CAPS capsule Take 50,000 Units by mouth every 14 (fourteen) days. Twice a month    [provider]    Family History History reviewed. No pertinent family history.  Social History Social History  Substance Use Topics  . Smoking status: Never Smoker  . Smokeless tobacco: Never Used  . Alcohol use No     Allergies   Amoxicillin; Ciprofloxacin; and Sulfa antibiotics   Review of Systems Review of Systems All other systems negative except as documented in the HPI. All pertinent positives and negatives as reviewed in the HPI.  Physical Exam Updated Vital Signs BP (!) 151/75 (BP Location: Left Arm)   Pulse 71   Temp 98 F (36.7 C) (Oral)   Resp 17   Ht 5' 3.5" (1.613 m)   Wt 105 lb (47.6 kg)   SpO2 97%   BMI 18.31 kg/m   Physical Exam  Constitutional: She is oriented to person, place, and time. She appears well-developed and well-nourished. No distress.  HENT:  Head: Normocephalic and atraumatic.  Eyes: Pupils are equal, round, and reactive to light.  Pulmonary/Chest:  Effort normal.  Neurological: She is alert and oriented to person, place, and time.  Skin: Skin is warm and dry.     Psychiatric: She has a normal mood and affect.  Nursing note and vitals reviewed.    ED Treatments / Results  Labs (all labs ordered are listed, but only abnormal results are displayed) Labs Reviewed - No data to display  EKG  EKG Interpretation None       Radiology No results found.  Procedures Procedures (including critical care time)  Medications Ordered in ED Medications - No data to display   Initial Impression / Assessment and Plan / ED Course  I have reviewed the triage vital signs and the nursing notes.  Pertinent labs  & imaging results that were available during my care of the patient were reviewed by me and considered in my medical decision making (see chart for details).     , Gauze is placed over there with a pressure dressing.  The bleeding has completely stopped.  The patient is advised follow-up with her primary care Dr. told keep the area clean and dry, apply pressure dressing on.  The patient was discharged, told to remove the dressing in the morning  Final Clinical Impressions(s) / ED Diagnoses   Final diagnoses:  Skin tear of right lower leg without complication, initial encounter    New Prescriptions Discharge Medication List as of 05/19/2016 12:00 AM       Dalia Heading, PA-C 05/24/16 0011    Varney Biles, MD 05/25/16 0177

## 2016-06-01 DIAGNOSIS — S81811A Laceration without foreign body, right lower leg, initial encounter: Secondary | ICD-10-CM | POA: Diagnosis not present

## 2016-06-08 DIAGNOSIS — H524 Presbyopia: Secondary | ICD-10-CM | POA: Diagnosis not present

## 2016-06-08 DIAGNOSIS — H26493 Other secondary cataract, bilateral: Secondary | ICD-10-CM | POA: Diagnosis not present

## 2016-08-11 DIAGNOSIS — K5790 Diverticulosis of intestine, part unspecified, without perforation or abscess without bleeding: Secondary | ICD-10-CM | POA: Diagnosis not present

## 2017-03-03 DIAGNOSIS — M810 Age-related osteoporosis without current pathological fracture: Secondary | ICD-10-CM | POA: Diagnosis not present

## 2017-03-03 DIAGNOSIS — E7800 Pure hypercholesterolemia, unspecified: Secondary | ICD-10-CM | POA: Diagnosis not present

## 2017-03-03 DIAGNOSIS — R0789 Other chest pain: Secondary | ICD-10-CM | POA: Diagnosis not present

## 2017-03-03 DIAGNOSIS — Z1389 Encounter for screening for other disorder: Secondary | ICD-10-CM | POA: Diagnosis not present

## 2017-03-03 DIAGNOSIS — Z0000 Encounter for general adult medical examination without abnormal findings: Secondary | ICD-10-CM | POA: Diagnosis not present

## 2017-03-04 ENCOUNTER — Telehealth (HOSPITAL_COMMUNITY): Payer: Self-pay | Admitting: Internal Medicine

## 2017-03-04 ENCOUNTER — Other Ambulatory Visit: Payer: Self-pay | Admitting: Internal Medicine

## 2017-03-04 DIAGNOSIS — R079 Chest pain, unspecified: Secondary | ICD-10-CM

## 2017-03-08 NOTE — Telephone Encounter (Signed)
User: Lisa Lang A Date/time: 03/04/17 2:14 PM  Comment: Called pt and lmsg for her to CB to get sch for echo.  Context:  Outcome: Left Message  Phone number: [phone] Phone Type: Home Phone  Comm. type: Telephone Call type: Outgoing  Contact: Barkley Boards P Relation to patient: Self

## 2017-03-11 ENCOUNTER — Other Ambulatory Visit: Payer: Self-pay

## 2017-03-11 ENCOUNTER — Ambulatory Visit (HOSPITAL_COMMUNITY): Payer: Medicare Other | Attending: Cardiology

## 2017-03-11 DIAGNOSIS — Z8249 Family history of ischemic heart disease and other diseases of the circulatory system: Secondary | ICD-10-CM | POA: Diagnosis not present

## 2017-03-11 DIAGNOSIS — I080 Rheumatic disorders of both mitral and aortic valves: Secondary | ICD-10-CM | POA: Insufficient documentation

## 2017-03-11 DIAGNOSIS — I272 Pulmonary hypertension, unspecified: Secondary | ICD-10-CM | POA: Insufficient documentation

## 2017-03-11 DIAGNOSIS — R079 Chest pain, unspecified: Secondary | ICD-10-CM | POA: Insufficient documentation

## 2017-03-11 DIAGNOSIS — E785 Hyperlipidemia, unspecified: Secondary | ICD-10-CM | POA: Insufficient documentation

## 2017-06-16 DIAGNOSIS — H26493 Other secondary cataract, bilateral: Secondary | ICD-10-CM | POA: Diagnosis not present

## 2017-06-16 DIAGNOSIS — H524 Presbyopia: Secondary | ICD-10-CM | POA: Diagnosis not present

## 2017-07-27 DIAGNOSIS — K122 Cellulitis and abscess of mouth: Secondary | ICD-10-CM | POA: Diagnosis not present

## 2017-08-26 DIAGNOSIS — D225 Melanocytic nevi of trunk: Secondary | ICD-10-CM | POA: Diagnosis not present

## 2017-08-26 DIAGNOSIS — L570 Actinic keratosis: Secondary | ICD-10-CM | POA: Diagnosis not present

## 2017-08-26 DIAGNOSIS — X32XXXD Exposure to sunlight, subsequent encounter: Secondary | ICD-10-CM | POA: Diagnosis not present

## 2017-08-26 DIAGNOSIS — D485 Neoplasm of uncertain behavior of skin: Secondary | ICD-10-CM | POA: Diagnosis not present

## 2017-08-26 DIAGNOSIS — D2261 Melanocytic nevi of right upper limb, including shoulder: Secondary | ICD-10-CM | POA: Diagnosis not present

## 2017-09-13 DIAGNOSIS — L988 Other specified disorders of the skin and subcutaneous tissue: Secondary | ICD-10-CM | POA: Diagnosis not present

## 2017-09-13 DIAGNOSIS — D485 Neoplasm of uncertain behavior of skin: Secondary | ICD-10-CM | POA: Diagnosis not present

## 2017-09-26 ENCOUNTER — Inpatient Hospital Stay (HOSPITAL_COMMUNITY)
Admission: EM | Admit: 2017-09-26 | Discharge: 2017-09-28 | DRG: 309 | Disposition: A | Discharge status: Home / Self Care | Payer: Medicare Other | Attending: Family Medicine | Admitting: Family Medicine

## 2017-09-26 ENCOUNTER — Emergency Department (HOSPITAL_COMMUNITY): Payer: Medicare Other

## 2017-09-26 ENCOUNTER — Encounter (HOSPITAL_COMMUNITY): Payer: Self-pay | Admitting: Emergency Medicine

## 2017-09-26 DIAGNOSIS — Z881 Allergy status to other antibiotic agents status: Secondary | ICD-10-CM

## 2017-09-26 DIAGNOSIS — R000 Tachycardia, unspecified: Secondary | ICD-10-CM | POA: Diagnosis present

## 2017-09-26 DIAGNOSIS — R0602 Shortness of breath: Secondary | ICD-10-CM | POA: Diagnosis not present

## 2017-09-26 DIAGNOSIS — E860 Dehydration: Secondary | ICD-10-CM | POA: Diagnosis present

## 2017-09-26 DIAGNOSIS — R7989 Other specified abnormal findings of blood chemistry: Secondary | ICD-10-CM | POA: Diagnosis present

## 2017-09-26 DIAGNOSIS — Z880 Allergy status to penicillin: Secondary | ICD-10-CM

## 2017-09-26 DIAGNOSIS — I481 Persistent atrial fibrillation: Secondary | ICD-10-CM | POA: Diagnosis not present

## 2017-09-26 DIAGNOSIS — I499 Cardiac arrhythmia, unspecified: Secondary | ICD-10-CM | POA: Diagnosis not present

## 2017-09-26 DIAGNOSIS — M1990 Unspecified osteoarthritis, unspecified site: Secondary | ICD-10-CM | POA: Diagnosis present

## 2017-09-26 DIAGNOSIS — Z79899 Other long term (current) drug therapy: Secondary | ICD-10-CM

## 2017-09-26 DIAGNOSIS — N83202 Unspecified ovarian cyst, left side: Secondary | ICD-10-CM | POA: Diagnosis present

## 2017-09-26 DIAGNOSIS — N83209 Unspecified ovarian cyst, unspecified side: Secondary | ICD-10-CM | POA: Diagnosis present

## 2017-09-26 DIAGNOSIS — Z7983 Long term (current) use of bisphosphonates: Secondary | ICD-10-CM

## 2017-09-26 DIAGNOSIS — N83292 Other ovarian cyst, left side: Secondary | ICD-10-CM | POA: Diagnosis not present

## 2017-09-26 DIAGNOSIS — I480 Paroxysmal atrial fibrillation: Secondary | ICD-10-CM | POA: Diagnosis present

## 2017-09-26 DIAGNOSIS — Z882 Allergy status to sulfonamides status: Secondary | ICD-10-CM

## 2017-09-26 DIAGNOSIS — I959 Hypotension, unspecified: Secondary | ICD-10-CM | POA: Diagnosis present

## 2017-09-26 DIAGNOSIS — K5730 Diverticulosis of large intestine without perforation or abscess without bleeding: Secondary | ICD-10-CM | POA: Diagnosis present

## 2017-09-26 DIAGNOSIS — R946 Abnormal results of thyroid function studies: Secondary | ICD-10-CM | POA: Diagnosis present

## 2017-09-26 DIAGNOSIS — E785 Hyperlipidemia, unspecified: Secondary | ICD-10-CM | POA: Diagnosis present

## 2017-09-26 DIAGNOSIS — Z8049 Family history of malignant neoplasm of other genital organs: Secondary | ICD-10-CM

## 2017-09-26 DIAGNOSIS — I4891 Unspecified atrial fibrillation: Secondary | ICD-10-CM | POA: Diagnosis present

## 2017-09-26 DIAGNOSIS — R42 Dizziness and giddiness: Secondary | ICD-10-CM | POA: Diagnosis not present

## 2017-09-26 DIAGNOSIS — I483 Typical atrial flutter: Secondary | ICD-10-CM | POA: Diagnosis not present

## 2017-09-26 DIAGNOSIS — R778 Other specified abnormalities of plasma proteins: Secondary | ICD-10-CM | POA: Diagnosis present

## 2017-09-26 DIAGNOSIS — Z7982 Long term (current) use of aspirin: Secondary | ICD-10-CM

## 2017-09-26 DIAGNOSIS — Z9842 Cataract extraction status, left eye: Secondary | ICD-10-CM

## 2017-09-26 DIAGNOSIS — I248 Other forms of acute ischemic heart disease: Secondary | ICD-10-CM | POA: Diagnosis not present

## 2017-09-26 DIAGNOSIS — R0902 Hypoxemia: Secondary | ICD-10-CM | POA: Diagnosis not present

## 2017-09-26 DIAGNOSIS — Z801 Family history of malignant neoplasm of trachea, bronchus and lung: Secondary | ICD-10-CM

## 2017-09-26 DIAGNOSIS — K828 Other specified diseases of gallbladder: Secondary | ICD-10-CM | POA: Diagnosis not present

## 2017-09-26 DIAGNOSIS — Z888 Allergy status to other drugs, medicaments and biological substances status: Secondary | ICD-10-CM

## 2017-09-26 DIAGNOSIS — I083 Combined rheumatic disorders of mitral, aortic and tricuspid valves: Secondary | ICD-10-CM | POA: Diagnosis present

## 2017-09-26 DIAGNOSIS — Z8249 Family history of ischemic heart disease and other diseases of the circulatory system: Secondary | ICD-10-CM

## 2017-09-26 DIAGNOSIS — I4892 Unspecified atrial flutter: Secondary | ICD-10-CM | POA: Diagnosis present

## 2017-09-26 DIAGNOSIS — K5790 Diverticulosis of intestine, part unspecified, without perforation or abscess without bleeding: Secondary | ICD-10-CM | POA: Diagnosis not present

## 2017-09-26 DIAGNOSIS — Z9841 Cataract extraction status, right eye: Secondary | ICD-10-CM

## 2017-09-26 DIAGNOSIS — I272 Pulmonary hypertension, unspecified: Secondary | ICD-10-CM | POA: Diagnosis present

## 2017-09-26 HISTORY — DX: Hyperlipidemia, unspecified: E78.5

## 2017-09-26 HISTORY — DX: Diverticulosis of intestine, part unspecified, without perforation or abscess without bleeding: K57.90

## 2017-09-26 LAB — I-STAT TROPONIN, ED: Troponin i, poc: 0.07 ng/mL (ref 0.00–0.08)

## 2017-09-26 LAB — COMPREHENSIVE METABOLIC PANEL
ALK PHOS: 85 U/L (ref 38–126)
ALT: 17 U/L (ref 0–44)
AST: 35 U/L (ref 15–41)
Albumin: 3.9 g/dL (ref 3.5–5.0)
Anion gap: 12 (ref 5–15)
BILIRUBIN TOTAL: 1.3 mg/dL — AB (ref 0.3–1.2)
BUN: 22 mg/dL (ref 8–23)
CALCIUM: 9.5 mg/dL (ref 8.9–10.3)
CO2: 25 mmol/L (ref 22–32)
Chloride: 102 mmol/L (ref 98–111)
Creatinine, Ser: 1.05 mg/dL — ABNORMAL HIGH (ref 0.44–1.00)
GFR, EST AFRICAN AMERICAN: 53 mL/min — AB (ref >60)
GFR, EST NON AFRICAN AMERICAN: 46 mL/min — AB (ref >60)
Glucose, Bld: 110 mg/dL — ABNORMAL HIGH (ref 70–99)
Potassium: 4.6 mmol/L (ref 3.5–5.1)
Sodium: 139 mmol/L (ref 135–145)
TOTAL PROTEIN: 6.9 g/dL (ref 6.5–8.1)

## 2017-09-26 LAB — TROPONIN I: Troponin I: 0.16 ng/mL (ref <0.03)

## 2017-09-26 LAB — CBC
HCT: 46.1 % — ABNORMAL HIGH (ref 36.0–46.0)
Hemoglobin: 14.3 g/dL (ref 12.0–15.0)
MCH: 30.4 pg (ref 26.0–34.0)
MCHC: 31 g/dL (ref 30.0–36.0)
MCV: 98.1 fL (ref 78.0–100.0)
PLATELETS: 192 10*3/uL (ref 150–400)
RBC: 4.7 MIL/uL (ref 3.87–5.11)
RDW: 12.6 % (ref 11.5–15.5)
WBC: 9.9 10*3/uL (ref 4.0–10.5)

## 2017-09-26 LAB — URINALYSIS, ROUTINE W REFLEX MICROSCOPIC
Bilirubin Urine: NEGATIVE
Glucose, UA: NEGATIVE mg/dL
Hgb urine dipstick: NEGATIVE
Ketones, ur: NEGATIVE mg/dL
LEUKOCYTES UA: NEGATIVE
Nitrite: NEGATIVE
PROTEIN: NEGATIVE mg/dL
Specific Gravity, Urine: 1.016 (ref 1.005–1.030)
pH: 8 (ref 5.0–8.0)

## 2017-09-26 LAB — LIPASE, BLOOD: LIPASE: 29 U/L (ref 11–51)

## 2017-09-26 MED ORDER — DILTIAZEM LOAD VIA INFUSION
10.0000 mg | Freq: Once | INTRAVENOUS | Status: AC
  Administered 2017-09-26: 10 mg via INTRAVENOUS
  Filled 2017-09-26: qty 10

## 2017-09-26 MED ORDER — SODIUM CHLORIDE 0.9 % IV BOLUS
500.0000 mL | Freq: Once | INTRAVENOUS | Status: AC
  Administered 2017-09-26: 500 mL via INTRAVENOUS

## 2017-09-26 MED ORDER — ACETAMINOPHEN 325 MG PO TABS: 650.0000 mg | ORAL_TABLET | ORAL | Status: DC | PRN

## 2017-09-26 MED ORDER — HEPARIN BOLUS VIA INFUSION
2450.0000 [IU] | Freq: Once | INTRAVENOUS | Status: AC
  Administered 2017-09-26: 2450 [IU] via INTRAVENOUS
  Filled 2017-09-26: qty 2450

## 2017-09-26 MED ORDER — HEPARIN (PORCINE) IN NACL 100-0.45 UNIT/ML-% IJ SOLN
700.0000 [IU]/h | INTRAMUSCULAR | Status: DC
  Administered 2017-09-26: 700 [IU]/h via INTRAVENOUS
  Filled 2017-09-26: qty 250

## 2017-09-26 MED ORDER — IOHEXOL 300 MG/ML  SOLN
100.0000 mL | Freq: Once | INTRAMUSCULAR | Status: AC | PRN
  Administered 2017-09-26: 100 mL via INTRAVENOUS

## 2017-09-26 MED ORDER — DILTIAZEM HCL-DEXTROSE 100-5 MG/100ML-% IV SOLN (PREMIX)
5.0000 mg/h | INTRAVENOUS | Status: DC
  Administered 2017-09-26: 5 mg/h via INTRAVENOUS
  Administered 2017-09-27: 2.5 mg/h via INTRAVENOUS
  Filled 2017-09-26 (×2): qty 100

## 2017-09-26 MED ORDER — ASPIRIN 81 MG PO CHEW
81.0000 mg | CHEWABLE_TABLET | Freq: Every day | ORAL | Status: DC
  Administered 2017-09-27: 81 mg via ORAL
  Filled 2017-09-26: qty 1

## 2017-09-26 MED ORDER — ONDANSETRON HCL 4 MG/2ML IJ SOLN: 4.0000 mg | Freq: Four times a day (QID) | INTRAMUSCULAR | Status: DC | PRN

## 2017-09-26 MED ORDER — SODIUM CHLORIDE 0.9 % IV SOLN
INTRAVENOUS | Status: DC
  Administered 2017-09-27: 01:00:00 via INTRAVENOUS

## 2017-09-26 MED ORDER — ATORVASTATIN CALCIUM 40 MG PO TABS
40.0000 mg | ORAL_TABLET | Freq: Every day | ORAL | Status: DC
  Administered 2017-09-27: 40 mg via ORAL
  Filled 2017-09-26: qty 1

## 2017-09-26 NOTE — ED Notes (Signed)
Patient transported to CT 

## 2017-09-26 NOTE — ED Triage Notes (Addendum)
Per EMS- the pt was at PCP from abdominal pain, she thought it was her diverticular pain. Pt also was having dizziness and had a HR of 120, EKG showed aflutter or afib. No palpitations or chest pain, negative for orthostatics. HR 110-140s. Denies abdominal pain or dizziness at present. 18G PIV to [place] placed by EMS. Pt rates pain 0/10 but when it comes it is to the LLQ. Has hx of mitral valve prolapse.

## 2017-09-26 NOTE — ED Provider Notes (Signed)
[facility] EMERGENCY DEPARTMENT Provider Note   CSN: [id number] Arrival date & time: 09/26/17  1244   History   Chief Complaint Chief Complaint  Patient presents with  . Abdominal Pain  . Atrial Flutter    HPI Lisa Lang is a 84 y.o. female with a hx of arthritis, mitral valve prolapse, and diverticulitis who arrives to the ED via EMS from PCP office due to abdominal pain and what appears to be new onset afib/aflutter. Patient states last night she went to bed feeling in her normal state of health, she woke up this AM with abdominal pain as well as some lightheadedness with standing. She states her abdominal pain is located in the LLQ, it is more with movement, alleviated with rest, and feels similar to prior diverticulitis, currently pain free laying in stretcher.  Denies fever, chills, dysuria, nausea, vomiting, diarrhea, melena, or hematochezia. She states she also noted some lightheadedness when transitioning from sitting to standing, resolved with sitting. She initially attributed this to being related to her abdominal pain. She states that she arrived to PCP office and there was concern for new onset afib/aflutter. Per EMS report patient has HR in the 110-140s, negative orthostatic vitals, and was transported to the ER for further evaluation. Patient denies chest pain, dyspnea, palpitations, or syncope throughout any point in time today including at present. Upon my assessment she has no complaints. She denies hx of afib/flutter. She is not anticoagulated, no prior head bleed/GI bleed. Denies leg pain/swelling, hemoptysis, recent surgery/trauma, recent long travel, hormone use, personal hx of cancer, or hx of DVT/PE.   HPI  Past Medical History:  Diagnosis Date  . Arthritis   . Diverticulitis   . Mitral valve prolapse     There are no active problems to display for this patient.   Past Surgical History:  Procedure Laterality Date  . CATARACT EXTRACTION,  BILATERAL       OB History   None      Home Medications    Prior to Admission medications   Medication Sig Start Date End Date Taking? Authorizing Provider  aspirin 81 MG tablet Take 81 mg by mouth daily.    [provider]  calcium carbonate (OS-CAL - DOSED IN MG OF ELEMENTAL CALCIUM) 1250 MG tablet Take 1 tablet by mouth daily with breakfast.    [provider]  calcium carbonate (TUMS - DOSED IN MG ELEMENTAL CALCIUM) 500 MG chewable tablet Chew 1 tablet by mouth daily.    [provider]  cholestyramine Lucrezia Starch) 4 G packet Take 1 packet by mouth daily.    [provider]  loperamide (IMODIUM) 2 MG capsule Take 1 capsule (2 mg total) by mouth 4 (four) times daily as needed for diarrhea or loose stools. 08/22/12   Orpah Greek, MD  Multiple Vitamins-Minerals (MULTIVITAMIN PO) Take 1 tablet by mouth daily.    [provider]  ondansetron (ZOFRAN) 4 MG tablet Take 1 tablet (4 mg total) by mouth every 6 (six) hours. 08/22/12   Orpah Greek, MD  rosuvastatin (CRESTOR) 10 MG tablet Take 10 mg by mouth daily.    [provider]  Vitamin D, Ergocalciferol, (DRISDOL) 50000 UNITS CAPS capsule Take 50,000 Units by mouth every 14 (fourteen) days. Twice a month    [provider]    Family History No family history on file.  Social History Social History   Tobacco Use  . Smoking status: Never Smoker  . Smokeless  tobacco: Never Used  Substance Use Topics  . Alcohol use: No  . Drug use: No    Allergies   Amoxicillin; Ciprofloxacin; Lidocaine; and Sulfa antibiotics   Review of Systems Review of Systems  Constitutional: Negative for chills and fever.  Respiratory: Negative for shortness of breath.   Cardiovascular: Negative for chest pain, palpitations and leg swelling.  Gastrointestinal: Positive for abdominal pain (resolved at present). Negative for blood in stool, constipation, diarrhea, nausea and  vomiting.  Genitourinary: Negative for dysuria.  Neurological: Positive for light-headedness (resolved at present). Negative for syncope, speech difficulty, weakness and numbness.  All other systems reviewed and are negative.  Physical Exam Updated Vital Signs BP 105/70 (BP Location: Right Arm)   Pulse (!) 107   Temp 97.8 F (36.6 C) (Oral)   Resp 16   SpO2 100%   Physical Exam  Constitutional: She appears well-developed and well-nourished.  Non-toxic appearance. No distress.  HENT:  Head: Normocephalic and atraumatic.  Eyes: Conjunctivae are normal. Right eye exhibits no discharge. Left eye exhibits no discharge.  Neck: Neck supple.  Cardiovascular: An irregularly irregular rhythm present. Tachycardia present.  Pulmonary/Chest: Effort normal and breath sounds normal. No respiratory distress. She has no wheezes. She has no rhonchi. She has no rales.  Respiration even and unlabored  Abdominal: Soft. She exhibits no distension. There is tenderness in the left lower quadrant. There is no rigidity, no rebound, no guarding, no CVA tenderness, no tenderness at McBurney's point and negative Murphy's sign.  Musculoskeletal: She exhibits no edema or tenderness.  Neurological: She is alert.  Clear speech.   Skin: Skin is warm and dry. No rash noted.  Psychiatric: She has a normal mood and affect. Her behavior is normal.  Nursing note and vitals reviewed.   ED Treatments / Results  Labs (all labs ordered are listed, but only abnormal results are displayed) Labs Reviewed  COMPREHENSIVE METABOLIC PANEL - Abnormal; Notable for the following components:      Result Value   Glucose, Bld 110 (*)    Creatinine, Ser 1.05 (*)    Total Bilirubin 1.3 (*)    GFR calc non Af Amer 46 (*)    GFR calc Af Amer 53 (*)    All other components within normal limits  CBC - Abnormal; Notable for the following components:   HCT 46.1 (*)    All other components within normal limits  URINALYSIS, ROUTINE W  REFLEX MICROSCOPIC - Abnormal; Notable for the following components:   Color, Urine STRAW (*)    All other components within normal limits  LIPASE, BLOOD  TROPONIN I  TROPONIN I  TROPONIN I  I-STAT TROPONIN, ED    EKG EKG Interpretation  Date/Time:  Monday September 26 2017 14:06:46 EDT Ventricular Rate:  119 PR Interval:    QRS Duration: 88 QT Interval:  344 QTC Calculation: 486 R Axis:   90 Text Interpretation:  Atrial flutter Anteroseptal infarct, age indeterminate No STEMI.  Confirmed by Nanda Quinton [phone]) on 09/26/2017 2:12:37 PM   Radiology Dg Chest 2 View  Result Date: 09/26/2017 CLINICAL DATA:  Shortness of breath. EXAM: CHEST - 2 VIEW COMPARISON:  Radiographs of May 02, 2008. FINDINGS: Stable cardiomediastinal silhouette. No pneumothorax or pleural effusion is noted. Both lungs are clear. The visualized skeletal structures are unremarkable. IMPRESSION: No active cardiopulmonary disease. Electronically Signed   By: Marijo Conception, M.D.   On: 09/26/2017 13:28   US Transvaginal Non-ob  Result Date: 09/26/2017 CLINICAL DATA:  Left lower quadrant pain, no prior surgery, cyst seen on same day CT. EXAM: TRANSABDOMINAL AND TRANSVAGINAL ULTRASOUND OF PELVIS TECHNIQUE: Both transabdominal and transvaginal ultrasound examinations of the pelvis were performed. Transabdominal technique was performed for global imaging of the pelvis including uterus, ovaries, adnexal regions, and pelvic cul-de-sac. It was necessary to proceed with endovaginal exam following the transabdominal exam to visualize the ovaries. COMPARISON:  Pelvic CT from earlier on the same day. FINDINGS: Uterus Measurements: 7.5 x 2.1 x 4.2 cm. No fibroids or other mass visualized. Endometrium Thickness: 2.1 mm.  No focal abnormality visualized. Right ovary Unable to be visualized due to adjacent bowel bowel gas. Left ovary There is an anechoic left adnexal cyst likely of ovarian etiology measuring 3.9 x 2.9 x 3 cm. It is  anechoic in appearance without mural nodularity nor septations. What appears represent ovarian tissue measures at least 3.6 x 4.9 cm AP by transverse dimension. Endovaginal imaging for better characterization was attempted but was unsuccessful due to patient pain and difficulty advancing the probe. Other findings No abnormal free fluid. IMPRESSION: Anechoic simple appearing left adnexal cyst likely of ovarian etiology without worrisome features measuring 3.9 x 2.9 x 3 cm. Consensus guidelines recommend annual ultrasound evaluation given size of the cyst, postmenopausal status of the patient and without concerning features identified. Electronically Signed   By: Ashley Royalty M.D.   On: 09/26/2017 19:15   US Pelvis Complete  Result Date: 09/26/2017 CLINICAL DATA:  Left lower quadrant pain, no prior surgery, cyst seen on same day CT. EXAM: TRANSABDOMINAL AND TRANSVAGINAL ULTRASOUND OF PELVIS TECHNIQUE: Both transabdominal and transvaginal ultrasound examinations of the pelvis were performed. Transabdominal technique was performed for global imaging of the pelvis including uterus, ovaries, adnexal regions, and pelvic cul-de-sac. It was necessary to proceed with endovaginal exam following the transabdominal exam to visualize the ovaries. COMPARISON:  Pelvic CT from earlier on the same day. FINDINGS: Uterus Measurements: 7.5 x 2.1 x 4.2 cm. No fibroids or other mass visualized. Endometrium Thickness: 2.1 mm.  No focal abnormality visualized. Right ovary Unable to be visualized due to adjacent bowel bowel gas. Left ovary There is an anechoic left adnexal cyst likely of ovarian etiology measuring 3.9 x 2.9 x 3 cm. It is anechoic in appearance without mural nodularity nor septations. What appears represent ovarian tissue measures at least 3.6 x 4.9 cm AP by transverse dimension. Endovaginal imaging for better characterization was attempted but was unsuccessful due to patient pain and difficulty advancing the probe. Other  findings No abnormal free fluid. IMPRESSION: Anechoic simple appearing left adnexal cyst likely of ovarian etiology without worrisome features measuring 3.9 x 2.9 x 3 cm. Consensus guidelines recommend annual ultrasound evaluation given size of the cyst, postmenopausal status of the patient and without concerning features identified. Electronically Signed   By: Ashley Royalty M.D.   On: 09/26/2017 19:15   Ct Abdomen Pelvis W Contrast  Result Date: 09/26/2017 CLINICAL DATA:  84 year old with lower abdominal pain. History of diverticulosis. EXAM: CT ABDOMEN AND PELVIS WITH CONTRAST TECHNIQUE: Multidetector CT imaging of the abdomen and pelvis was performed using the standard protocol following bolus administration of intravenous contrast. CONTRAST:  142mL OMNIPAQUE IOHEXOL 300 MG/ML  SOLN COMPARISON:  CT 04/07/2010 FINDINGS: Lower chest: Stable 4 mm pleural-based nodule in left lower lobe. Otherwise, the lung bases are clear. Hepatobiliary: Common bile duct is mildly dilated measuring up to 8 mm. No clear evidence for an obstructing stone or lesion. Gallbladder is mildly distended without inflammatory  changes. Normal appearance of the liver. Portal venous system is patent. Pancreas: Minimal dilatation of the main pancreatic duct without inflammatory changes. No suspicious pancreatic lesion. Spleen: Normal in size without focal abnormality. Adrenals/Urinary Tract: Normal adrenal glands. Normal appearance both kidneys. Urinary bladder is unremarkable. Stomach/Bowel: Wall thickening involving the sigmoid colon on sequence 3, image 55. Wall thickening has not significantly changed since 2012. No evidence for acute colonic inflammation. Mild wall thickening of the stomach but the stomach is also decompressed. No gross abnormality to the small bowel. Vascular/Lymphatic: Atherosclerotic calcifications in the thoracic and abdominal aorta. Main visceral arteries are patent. No significant lymph node enlargement. Prominent  bilateral ovarian veins, left side greater than right. Reproductive: Low-density structure involving the left adnexa has enlarged since 2012. This structure measures 3.6 x 2.9 cm and previously measured up to 1.6 cm. No gross abnormality to the uterus or right adnexa. Other: Negative for free fluid.  Negative for free air. Musculoskeletal: Chronic disc space narrowing at L5-S1. No suspicious bone lesions. IMPRESSION: 1. No acute abnormality in the abdomen or pelvis. 2. Wall thickening of the sigmoid colon appears chronic and compatible with diverticulosis. No evidence for acute colonic inflammation 3. **An incidental finding of potential clinical significance has been found. Enlargement of a low-density left adnexal structure. This is probably a cystic structure. Recommend non-emergent pelvic ultrasound to confirm that this is a simple cystic structure.** 4. Biliary dilatation without evidence for an obstructing stone, lesion or gallbladder inflammation. 5. Nonspecific gastric wall thickening which may be related to under distention. 6.  Aortic Atherosclerosis (ICD10-I70.0). Electronically Signed   By: Markus Daft M.D.   On: 09/26/2017 16:19    Procedures Procedures (including critical care time) CRITICAL CARE Performed by: Kennith Maes   Total critical care time: 35 minutes  Critical care time was exclusive of separately billable procedures and treating other patients.  Critical care was necessary to treat or prevent imminent or life-threatening deterioration.  Critical care was time spent personally by me on the following activities: development of treatment plan with patient and/or surrogate as well as nursing, discussions with consultants, evaluation of patient's response to treatment, examination of patient, obtaining history from patient or surrogate, ordering and performing treatments and interventions, ordering and review of laboratory studies, ordering and review of radiographic  studies, pulse oximetry and re-evaluation of patient's condition. Medications Ordered in ED Medications  diltiazem (CARDIZEM) 1 mg/mL load via infusion 10 mg (10 mg Intravenous Bolus from Bag 09/26/17 1444)    And  diltiazem (CARDIZEM) 100 mg in dextrose 5% 157mL (1 mg/mL) infusion (5 mg/hr Intravenous New Bag/Given 09/26/17 1443)  heparin ADULT infusion 100 units/mL (25000 units/248mL sodium chloride 0.45%) (has no administration in time range)  heparin bolus via infusion 2,450 Units (has no administration in time range)  iohexol (OMNIPAQUE) 300 MG/ML solution 100 mL (100 mLs Intravenous Contrast Given 09/26/17 1527)  sodium chloride 0.9 % bolus 500 mL (500 mLs Intravenous New Bag/Given 09/26/17 1637)    Initial Impression / Assessment and Plan / ED Course  I have reviewed the triage vital signs and the nursing notes.  Pertinent labs & imaging results that were available during my care of the patient were reviewed by me and considered in my medical decision making (see chart for details).   Patient presents to the emergency department from PCP office due to abdominal pain with what appears to be new onset atrial fibrillation versus atrial flutter with variable block.  Patient nontoxic-appearing, no apparent distress,  resting comfortably.  Patient is tachycardic with heart rate ranging in the 120s, EKG unclear with atrial fib vs. Flutter with variable block.  Patient not currently hypotensive.  She does not have any specific/clear symptoms related to abnormal heart rhythm, unclear onset.  Will attempt to slow rate with Cardizem load and infusion with reassessment. CHA2DS2 VASc Score of 3 for age and sex, she has no prior hx of traumatic head bleed, hemorrhagic stroke, or GI bleeds, will likely require anticoagulation.   Regarding her abdominal discomfort she does have tenderness in the left lower quadrant with known diverticulosis, suspect this is diverticulitis in origin, however given the fact that  she has new onset A. Fib/flutter feel this warrants further evaluation with CT abdomen pelvis with contrast.  No peritoneal signs on exam.  Work-up thus far is fairly reassuring.  Her initial troponin is negative.  She is without leukocytosis or anemia.  Renal function slightly elevated compared to prior on record creatinine of 1.05, however last labs on chart review are from 4 years ago.  Urinalysis without infection. Lipase and LFTs WNL. Chest x-ray negative for acute cardiopulmonary disease, no evidence of pneumothorax, effusion, or infiltrate.  CT scan without findings of acute colonic inflammation. There is an in an incidental finding of potential clinical significance has been found- enlargement of a low-density left adnexal structure. This is probably a cystic structure. Recommend non-emergent pelvic ultrasound to confirm that this is a simple cystic structure- given this is area of patient's pain will obtain ultrasound in the ER. Additional findings of biliary dilatation without evidence for an obstructing stone, lesion or gallbladder inflammation, nonspecific gastric wall thickening which may be related to under distention, and aortic atherosclerosis. Ultrasound ordered and pending  16:00: Patient HR ranging 100-115 on Cardizem infusion at rate of 5, pressures are a bit soft, will order 500 cc of fluids and continue to monitor.   18:56: Spoke with radiology- patient unable to tolerate transvaginal exam therefore unable to obtain doppler US, this was discontinued.   Ultrasound with anechoic simple appearing left adnexal cyst likely of ovarian etiology without worrisome features measuring 3.9 x 2.9 x 3 cm. Consensus guidelines recommend annual ultrasound evaluation given size of the cyst, postmenopausal status of the patient and without concerning features identified.  Patient HR 95-115, remains irregularly irregular, will order heparin per pharmacy and consult hospitalist service for admission.     I discussed results and treatment plan with patient and her family at bedside, provided opportunity for questions, confirmed understanding and in agreement with plan.   20:20: CONSULT: Discussed with hospitalist Dr. Roel Cluck- accepts admission.   Findings and plan of care discussed with supervising physician Dr. Laverta Baltimore who is in agreement.    Final Clinical Impressions(s) / ED Diagnoses   Final diagnoses:  Atrial fibrillation with RVR [facility])  Cyst of left ovary    ED Discharge Orders    None       Leafy Kindle 09/26/17 2033    Margette Fast, MD 09/27/17 1115

## 2017-09-26 NOTE — Progress Notes (Signed)
ANTICOAGULATION CONSULT NOTE - Initial Consult  Pharmacy Consult for heparin Indication: atrial fibrillation  Allergies  Allergen Reactions  . Amoxicillin   . Ciprofloxacin   . Lidocaine   . Sulfa Antibiotics     Patient Measurements:   Heparin Dosing Weight: 49  Vital Signs: Temp: 97.8 F (36.6 C) (09/23 1250) Temp Source: Oral (09/23 1250) BP: 105/66 (09/23 1636) Pulse Rate: 115 (09/23 1636)  Labs: Recent Labs    09/26/17 1255  HGB 14.3  HCT 46.1*  PLT 192  CREATININE 1.05*    CrCl cannot be calculated (Unknown ideal weight.).   Medical History: Past Medical History:  Diagnosis Date  . Arthritis   . Diverticulitis   . Mitral valve prolapse     Medications:    Assessment: 84 yo F admitted with abdominal pain, found to have new onset Afib.  Pharmacy consulted for heparin dosing. CBC WNL.  Not on anticoagulatoin PTA. No bleeding per patient   Goal of Therapy:  Heparin level 0.3-0.7 units/ml Monitor platelets by anticoagulation protocol: Yes   Plan:  Heparin bolus 2450 units IV, then 700 units/hr IV infusion 8 hr Heparin level Daily heparin level, CBC  Harrietta Guardian, PharmD PGY1 Pharmacy Resident 09/26/2017    8:27 PM

## 2017-09-26 NOTE — ED Notes (Signed)
Patient transported to Ultrasound 

## 2017-09-26 NOTE — H&P (Addendum)
Lisa Lang:[id number] DOB: 07-29-1927 DOA: 09/26/2017     PCP: Lavone Orn, MD   Outpatient Specialists:  NONE    Patient arrived to ER on 09/26/17 at 1244  Patient coming from: home Lives With family husband     Chief Complaint:  Chief Complaint  Patient presents with  . Abdominal Pain  . Atrial Flutter    HPI: Lisa Lang is a 84 y.o. female with medical history significant of mitral valve prolapse, diverticulosis/litis,   Presented with LLQ Abd pain went to PCP HR 110-140 orthostatics were negative in the office. PCP suspected a.fib and EMS was called to transfer to ER.  Denies any chest  pain no palpitations. She endorses lightheadedness when standing. ABD pain was worse with movement.  NO fever or chills. No N/V/D, denies palpitations.  No hx of prior GI bleeds no blood in stool no black stools.  NO recent travel   Regarding pertinent Chronic problems: Hyperlipidemia on Crestor   While in ER: A.fib/flutter with RVR HR 120's initially now down to 90's on Diltiazem drip and heparin drip CT abd ovarian  Cyst will need to have follow up.  The following Work up has been ordered so far:  Orders Placed This Encounter  Procedures  . DG Chest 2 View  . CT Abdomen Pelvis W Contrast  . US Pelvis Complete  . US Transvaginal Non-OB  . Lipase, blood  . Comprehensive metabolic panel  . CBC  . Urinalysis, Routine w reflex microscopic  . Diet NPO time specified  . Saline Lock IV, Maintain IV access  . heparin per pharmacy consult  . Consult to hospitalist  . I-Stat Troponin, ED (not at [facility])  . ED EKG  . EKG 12-Lead  . Repeat EKG  . EKG 12-Lead     Following Medications were ordered in ER: Medications  diltiazem (CARDIZEM) 1 mg/mL load via infusion 10 mg (10 mg Intravenous Bolus from Bag 09/26/17 1444)    And  diltiazem (CARDIZEM) 100 mg in dextrose 5% 148mL (1 mg/mL) infusion (5 mg/hr Intravenous New Bag/Given 09/26/17 1443)  heparin ADULT infusion 100  units/mL (25000 units/261mL sodium chloride 0.45%) (has no administration in time range)  heparin bolus via infusion 2,450 Units (has no administration in time range)  iohexol (OMNIPAQUE) 300 MG/ML solution 100 mL (100 mLs Intravenous Contrast Given 09/26/17 1527)  sodium chloride 0.9 % bolus 500 mL (500 mLs Intravenous New Bag/Given 09/26/17 1637)    Significant initial  Findings: Abnormal Labs Reviewed  COMPREHENSIVE METABOLIC PANEL - Abnormal; Notable for the following components:      Result Value   Glucose, Bld 110 (*)    Creatinine, Ser 1.05 (*)    Total Bilirubin 1.3 (*)    GFR calc non Af Amer 46 (*)    GFR calc Af Amer 53 (*)    All other components within normal limits  CBC - Abnormal; Notable for the following components:   HCT 46.1 (*)    All other components within normal limits  URINALYSIS, ROUTINE W REFLEX MICROSCOPIC - Abnormal; Notable for the following components:   Color, Urine STRAW (*)    All other components within normal limits     Na 139 K 4.6  Cr   Up from baseline see below Lab Results  Component Value Date   CREATININE 1.05 (H) 09/26/2017   CREATININE 0.83 08/22/2012   CREATININE 0.78 05/02/2008      WBC  9.9  HG/HCT  Stable     Component Value Date/Time   HGB 14.3 09/26/2017 1255   HCT 46.1 (H) 09/26/2017 1255       Troponin (Point of Care Test) Recent Labs    09/26/17 1308  TROPIPOC 0.07       UA   NO UTI    CXR -  NON acute  CTabd/pelvis - left adnexal cyst likely of ovarian etiology without worrisome features measuring 3.9 x 2.9 x 3 cm  [place] - Anechoic simple appearing left adnexal cyst likely of ovarian etiology without worrisome features measuring 3.9 x 2.9 x 3 cm.  ECG:  Personally reviewed by me showing: HR : 119 Rhythm: A.fib/flutter   no evidence of ischemic changes QTC 486       ED Triage Vitals  Enc Vitals Group     BP 09/26/17 1250 105/70     Pulse Rate 09/26/17 1250 (!) 107     Resp 09/26/17 1250 16       Temp 09/26/17 1250 97.8 F (36.6 C)     Temp Source 09/26/17 1250 Oral     SpO2 09/26/17 1250 100 %     Weight 09/26/17 2000 108 lb (49 kg)     Height 09/26/17 2000 5\' 2"  (1.575 m)     Head Circumference --      Peak Flow --      Pain Score 09/26/17 1252 0     Pain Loc --      Pain Edu? --      Excl. in [place]? --   TMAX(24)@       Latest  Blood pressure 105/66, pulse (!) 115, temperature 97.8 F (36.6 C), temperature source Oral, resp. rate 16, height 5\' 2"  (1.575 m), weight 49 kg, SpO2 96 %.     Hospitalist was called for admission for new onset A.fib/flutter   Review of Systems:    Pertinent positives include:  abdominal pain,  Constitutional:  No weight loss, night sweats, Fevers, chills, fatigue, weight loss  HEENT:  No headaches, Difficulty swallowing,Tooth/dental problems,Sore throat,  No sneezing, itching, ear ache, nasal congestion, post nasal drip,  Cardio-vascular:  No chest pain, Orthopnea, PND, anasarca, dizziness, palpitations.no Bilateral lower extremity swelling  GI:  No heartburn, indigestion, nausea, vomiting, diarrhea, change in bowel habits, loss of appetite, melena, blood in stool, hematemesis Resp:  no shortness of breath at rest. No dyspnea on exertion, No excess mucus, no productive cough, No non-productive cough, No coughing up of blood.No change in color of mucus.No wheezing. Skin:  no rash or lesions. No jaundice GU:  no dysuria, change in color of urine, no urgency or frequency. No straining to urinate.  No flank pain.  Musculoskeletal:  No joint pain or no joint swelling. No decreased range of motion. No back pain.  Psych:  No change in mood or affect. No depression or anxiety. No memory loss.  Neuro: no localizing neurological complaints, no tingling, no weakness, no double vision, no gait abnormality, no slurred speech, no confusion  All systems reviewed and apart from [place] all are negative  Past Medical History:   Past Medical  History:  Diagnosis Date  . Arthritis   . Diverticulitis   . Mitral valve prolapse       Past Surgical History:  Procedure Laterality Date  . CATARACT EXTRACTION, BILATERAL      Social History:  Ambulatory  independently       reports that she has never smoked. She has never used  smokeless tobacco. She reports that she does not drink alcohol or use drugs.   Family History:   Family History  Problem Relation Age of Onset  . Cervical cancer Mother   . CAD Father   . Lung cancer Sister   . Stroke Neg Hx   . Diabetes Neg Hx     Allergies: Allergies  Allergen Reactions  . Amoxicillin   . Ciprofloxacin   . Lidocaine   . Sulfa Antibiotics      Prior to Admission medications   Medication Sig Start Date End Date Taking? Authorizing Provider  alendronate (FOSAMAX) 70 MG tablet Take 1 tablet by mouth once a week. 09/10/17  Yes [provider]  aspirin 81 MG tablet Take 81 mg by mouth daily.   Yes [provider]  atorvastatin (LIPITOR) 40 MG tablet Take 40 mg by mouth at bedtime. 09/05/17  Yes [provider]  calcium carbonate (OS-CAL - DOSED IN MG OF ELEMENTAL CALCIUM) 1250 MG tablet Take 1 tablet by mouth daily with breakfast.   Yes [provider]  Multiple Vitamins-Minerals (MULTIVITAMIN PO) Take 1 tablet by mouth daily.   Yes [provider]  Vitamin D, Ergocalciferol, 2000 units CAPS Take 2,000 Units by mouth daily. Twice a month    Yes [provider]  loperamide (IMODIUM) 2 MG capsule Take 1 capsule (2 mg total) by mouth 4 (four) times daily as needed for diarrhea or loose stools. Patient not taking: Reported on 09/26/2017 08/22/12   Orpah Greek, MD  ondansetron (ZOFRAN) 4 MG tablet Take 1 tablet (4 mg total) by mouth every 6 (six) hours. Patient not taking: Reported on 09/26/2017 08/22/12   Orpah Greek, MD   Physical Exam: Blood pressure 105/66, pulse (!) 115, temperature 97.8 F (36.6 C),  temperature source Oral, resp. rate 16, height 5\' 2"  (1.575 m), weight 49 kg, SpO2 96 %. 1. General:  in No Acute distress  well  -appearing 2. Psychological: Alert and  Oriented 3. Head/ENT:    Dry Mucous Membranes                          Head Non traumatic, neck supple                          Poor Dentition 4. SKIN:   decreased Skin turgor,  Skin clean Dry and intact no rash 5. Heart: Regular rate and rhythm mild Murmur, no Rub or gallop 6. Lungs:   no wheezes or crackles   7. Abdomen: Soft, non-tender, Non distended   bowel sounds present 8. Lower extremities: no clubbing, cyanosis, or  edema 9. Neurologically Grossly intact, moving all 4 extremities equally   10. MSK: Normal range of motion   LABS:     Recent Labs  Lab 09/26/17 1255  WBC 9.9  HGB 14.3  HCT 46.1*  MCV 98.1  PLT 035   Basic Metabolic Panel: Recent Labs  Lab 09/26/17 1255  NA 139  K 4.6  CL 102  CO2 25  GLUCOSE 110*  BUN 22  CREATININE 1.05*  CALCIUM 9.5      Recent Labs  Lab 09/26/17 1255  AST 35  ALT 17  ALKPHOS 85  BILITOT 1.3*  PROT 6.9  ALBUMIN 3.9   Recent Labs  Lab 09/26/17 1255  LIPASE 29   No results for input(s): AMMONIA in the last 168 hours.  HbA1C: No results for input(s): HGBA1C in the last 72 hours. CBG: No results for input(s): GLUCAP in the last 168 hours.    Urine analysis:    Component Value Date/Time   COLORURINE STRAW (A) 09/26/2017 1545   APPEARANCEUR CLEAR 09/26/2017 1545   LABSPEC 1.016 09/26/2017 1545   PHURINE 8.0 09/26/2017 1545   GLUCOSEU NEGATIVE 09/26/2017 1545   HGBUR NEGATIVE 09/26/2017 1545   BILIRUBINUR NEGATIVE 09/26/2017 1545   KETONESUR NEGATIVE 09/26/2017 1545   PROTEINUR NEGATIVE 09/26/2017 1545   UROBILINOGEN 0.2 08/22/2012 0056   NITRITE NEGATIVE 09/26/2017 1545   LEUKOCYTESUR NEGATIVE 09/26/2017 1545       Cultures:    Component Value Date/Time   SDES URINE, CLEAN CATCH 04/07/2010 0600   SPECREQUEST NONE 04/07/2010  0600   CULT  04/07/2010 0600    Multiple bacterial morphotypes present, none predominant. Suggest appropriate recollection if clinically indicated.   REPTSTATUS 04/08/2010 FINAL 04/07/2010 0600     Radiological Exams on Admission: Dg Chest 2 View  Result Date: 09/26/2017 CLINICAL DATA:  Shortness of breath. EXAM: CHEST - 2 VIEW COMPARISON:  Radiographs of May 02, 2008. FINDINGS: Stable cardiomediastinal silhouette. No pneumothorax or pleural effusion is noted. Both lungs are clear. The visualized skeletal structures are unremarkable. IMPRESSION: No active cardiopulmonary disease. Electronically Signed   By: Marijo Conception, M.D.   On: 09/26/2017 13:28   US Transvaginal Non-ob  Result Date: 09/26/2017 CLINICAL DATA:  Left lower quadrant pain, no prior surgery, cyst seen on same day CT. EXAM: TRANSABDOMINAL AND TRANSVAGINAL ULTRASOUND OF PELVIS TECHNIQUE: Both transabdominal and transvaginal ultrasound examinations of the pelvis were performed. Transabdominal technique was performed for global imaging of the pelvis including uterus, ovaries, adnexal regions, and pelvic cul-de-sac. It was necessary to proceed with endovaginal exam following the transabdominal exam to visualize the ovaries. COMPARISON:  Pelvic CT from earlier on the same day. FINDINGS: Uterus Measurements: 7.5 x 2.1 x 4.2 cm. No fibroids or other mass visualized. Endometrium Thickness: 2.1 mm.  No focal abnormality visualized. Right ovary Unable to be visualized due to adjacent bowel bowel gas. Left ovary There is an anechoic left adnexal cyst likely of ovarian etiology measuring 3.9 x 2.9 x 3 cm. It is anechoic in appearance without mural nodularity nor septations. What appears represent ovarian tissue measures at least 3.6 x 4.9 cm AP by transverse dimension. Endovaginal imaging for better characterization was attempted but was unsuccessful due to patient pain and difficulty advancing the probe. Other findings No abnormal free fluid.  IMPRESSION: Anechoic simple appearing left adnexal cyst likely of ovarian etiology without worrisome features measuring 3.9 x 2.9 x 3 cm. Consensus guidelines recommend annual ultrasound evaluation given size of the cyst, postmenopausal status of the patient and without concerning features identified. Electronically Signed   By: Ashley Royalty M.D.   On: 09/26/2017 19:15   US Pelvis Complete  Result Date: 09/26/2017 CLINICAL DATA:  Left lower quadrant pain, no prior surgery, cyst seen on same day CT. EXAM: TRANSABDOMINAL AND TRANSVAGINAL ULTRASOUND OF PELVIS TECHNIQUE: Both transabdominal and transvaginal ultrasound examinations of the pelvis were performed. Transabdominal technique was performed for global imaging of the pelvis including uterus, ovaries, adnexal regions, and pelvic cul-de-sac. It was necessary to proceed with endovaginal exam following the transabdominal exam to visualize the ovaries. COMPARISON:  Pelvic CT from earlier on the same day. FINDINGS: Uterus Measurements: 7.5 x 2.1 x 4.2 cm. No fibroids or other mass visualized. Endometrium Thickness: 2.1 mm.  No focal abnormality  visualized. Right ovary Unable to be visualized due to adjacent bowel bowel gas. Left ovary There is an anechoic left adnexal cyst likely of ovarian etiology measuring 3.9 x 2.9 x 3 cm. It is anechoic in appearance without mural nodularity nor septations. What appears represent ovarian tissue measures at least 3.6 x 4.9 cm AP by transverse dimension. Endovaginal imaging for better characterization was attempted but was unsuccessful due to patient pain and difficulty advancing the probe. Other findings No abnormal free fluid. IMPRESSION: Anechoic simple appearing left adnexal cyst likely of ovarian etiology without worrisome features measuring 3.9 x 2.9 x 3 cm. Consensus guidelines recommend annual ultrasound evaluation given size of the cyst, postmenopausal status of the patient and without concerning features identified.  Electronically Signed   By: Ashley Royalty M.D.   On: 09/26/2017 19:15   Ct Abdomen Pelvis W Contrast  Result Date: 09/26/2017 CLINICAL DATA:  84 year old with lower abdominal pain. History of diverticulosis. EXAM: CT ABDOMEN AND PELVIS WITH CONTRAST TECHNIQUE: Multidetector CT imaging of the abdomen and pelvis was performed using the standard protocol following bolus administration of intravenous contrast. CONTRAST:  131mL OMNIPAQUE IOHEXOL 300 MG/ML  SOLN COMPARISON:  CT 04/07/2010 FINDINGS: Lower chest: Stable 4 mm pleural-based nodule in left lower lobe. Otherwise, the lung bases are clear. Hepatobiliary: Common bile duct is mildly dilated measuring up to 8 mm. No clear evidence for an obstructing stone or lesion. Gallbladder is mildly distended without inflammatory changes. Normal appearance of the liver. Portal venous system is patent. Pancreas: Minimal dilatation of the main pancreatic duct without inflammatory changes. No suspicious pancreatic lesion. Spleen: Normal in size without focal abnormality. Adrenals/Urinary Tract: Normal adrenal glands. Normal appearance both kidneys. Urinary bladder is unremarkable. Stomach/Bowel: Wall thickening involving the sigmoid colon on sequence 3, image 55. Wall thickening has not significantly changed since 2012. No evidence for acute colonic inflammation. Mild wall thickening of the stomach but the stomach is also decompressed. No gross abnormality to the small bowel. Vascular/Lymphatic: Atherosclerotic calcifications in the thoracic and abdominal aorta. Main visceral arteries are patent. No significant lymph node enlargement. Prominent bilateral ovarian veins, left side greater than right. Reproductive: Low-density structure involving the left adnexa has enlarged since 2012. This structure measures 3.6 x 2.9 cm and previously measured up to 1.6 cm. No gross abnormality to the uterus or right adnexa. Other: Negative for free fluid.  Negative for free air.  Musculoskeletal: Chronic disc space narrowing at L5-S1. No suspicious bone lesions. IMPRESSION: 1. No acute abnormality in the abdomen or pelvis. 2. Wall thickening of the sigmoid colon appears chronic and compatible with diverticulosis. No evidence for acute colonic inflammation 3. **An incidental finding of potential clinical significance has been found. Enlargement of a low-density left adnexal structure. This is probably a cystic structure. Recommend non-emergent pelvic ultrasound to confirm that this is a simple cystic structure.** 4. Biliary dilatation without evidence for an obstructing stone, lesion or gallbladder inflammation. 5. Nonspecific gastric wall thickening which may be related to under distention. 6.  Aortic Atherosclerosis (ICD10-I70.0). Electronically Signed   By: Markus Daft M.D.   On: 09/26/2017 16:19    Chart has been reviewed    Assessment/Plan   84 y.o. female with medical history significant of mitral valve prolapse, diverticulosis/litis,  Admitted for new onset A.fib/flutter   Present on Admission: . Atrial fibrillation with RVR (HCC) -  - Admit to step down on Cardizem drip       CHA2D-VASC score 3  Will start on  Heparin,                  Check TSH      Cycle cardiac enzymes      Obtain ECHO      Cardiology consult in AM  . Ovarian cyst - will need to follow up as an outpatient.  Elevated troponin - likely demand ischemia no chest pain, no ischemic ECg changes, ECHO in AM and CArdiology consult  Other plan as per orders.  DVT prophylaxis: heparin    Code Status:  FULL CODE as per patient   I had personally discussed CODE STATUS with patient     Family Communication:   Family not  at  Bedside    Disposition Plan:        To home once workup is complete and patient is stable                      Consults called: email cardiology    Admission status:    Obs    Level of care     SDU tele indefinitely please discontinue once patient no longer  qualifies       Rafiel Mecca 09/26/2017, 9:04 PM    [facility]  Pager [phone]   after 2 AM please page floor coverage PA If 7AM-7PM, please contact the day team taking care of the patient  [email or web address]  Password [email or web address]

## 2017-09-27 ENCOUNTER — Encounter (HOSPITAL_COMMUNITY): Payer: Self-pay | Admitting: Physician Assistant

## 2017-09-27 ENCOUNTER — Other Ambulatory Visit: Payer: Self-pay

## 2017-09-27 ENCOUNTER — Ambulatory Visit (HOSPITAL_COMMUNITY): Payer: Medicare Other

## 2017-09-27 DIAGNOSIS — I248 Other forms of acute ischemic heart disease: Secondary | ICD-10-CM | POA: Diagnosis present

## 2017-09-27 DIAGNOSIS — Z880 Allergy status to penicillin: Secondary | ICD-10-CM | POA: Diagnosis not present

## 2017-09-27 DIAGNOSIS — Z8049 Family history of malignant neoplasm of other genital organs: Secondary | ICD-10-CM | POA: Diagnosis not present

## 2017-09-27 DIAGNOSIS — R748 Abnormal levels of other serum enzymes: Secondary | ICD-10-CM

## 2017-09-27 DIAGNOSIS — Z801 Family history of malignant neoplasm of trachea, bronchus and lung: Secondary | ICD-10-CM | POA: Diagnosis not present

## 2017-09-27 DIAGNOSIS — R946 Abnormal results of thyroid function studies: Secondary | ICD-10-CM | POA: Diagnosis present

## 2017-09-27 DIAGNOSIS — Z7983 Long term (current) use of bisphosphonates: Secondary | ICD-10-CM | POA: Diagnosis not present

## 2017-09-27 DIAGNOSIS — Z9841 Cataract extraction status, right eye: Secondary | ICD-10-CM | POA: Diagnosis not present

## 2017-09-27 DIAGNOSIS — I361 Nonrheumatic tricuspid (valve) insufficiency: Secondary | ICD-10-CM | POA: Diagnosis not present

## 2017-09-27 DIAGNOSIS — I340 Nonrheumatic mitral (valve) insufficiency: Secondary | ICD-10-CM | POA: Diagnosis not present

## 2017-09-27 DIAGNOSIS — N83202 Unspecified ovarian cyst, left side: Secondary | ICD-10-CM | POA: Diagnosis not present

## 2017-09-27 DIAGNOSIS — Z79899 Other long term (current) drug therapy: Secondary | ICD-10-CM | POA: Diagnosis not present

## 2017-09-27 DIAGNOSIS — I4891 Unspecified atrial fibrillation: Secondary | ICD-10-CM

## 2017-09-27 DIAGNOSIS — I4892 Unspecified atrial flutter: Secondary | ICD-10-CM | POA: Diagnosis present

## 2017-09-27 DIAGNOSIS — R778 Other specified abnormalities of plasma proteins: Secondary | ICD-10-CM | POA: Diagnosis present

## 2017-09-27 DIAGNOSIS — R000 Tachycardia, unspecified: Secondary | ICD-10-CM | POA: Diagnosis present

## 2017-09-27 DIAGNOSIS — R7989 Other specified abnormal findings of blood chemistry: Secondary | ICD-10-CM

## 2017-09-27 DIAGNOSIS — E785 Hyperlipidemia, unspecified: Secondary | ICD-10-CM | POA: Diagnosis present

## 2017-09-27 DIAGNOSIS — E860 Dehydration: Secondary | ICD-10-CM | POA: Diagnosis present

## 2017-09-27 DIAGNOSIS — I481 Persistent atrial fibrillation: Secondary | ICD-10-CM | POA: Diagnosis present

## 2017-09-27 DIAGNOSIS — I272 Pulmonary hypertension, unspecified: Secondary | ICD-10-CM | POA: Diagnosis present

## 2017-09-27 DIAGNOSIS — I480 Paroxysmal atrial fibrillation: Secondary | ICD-10-CM | POA: Diagnosis present

## 2017-09-27 DIAGNOSIS — I959 Hypotension, unspecified: Secondary | ICD-10-CM

## 2017-09-27 DIAGNOSIS — Z7982 Long term (current) use of aspirin: Secondary | ICD-10-CM | POA: Diagnosis not present

## 2017-09-27 DIAGNOSIS — Z882 Allergy status to sulfonamides status: Secondary | ICD-10-CM | POA: Diagnosis not present

## 2017-09-27 DIAGNOSIS — Z8249 Family history of ischemic heart disease and other diseases of the circulatory system: Secondary | ICD-10-CM | POA: Diagnosis not present

## 2017-09-27 DIAGNOSIS — M1990 Unspecified osteoarthritis, unspecified site: Secondary | ICD-10-CM | POA: Diagnosis present

## 2017-09-27 DIAGNOSIS — Z888 Allergy status to other drugs, medicaments and biological substances status: Secondary | ICD-10-CM | POA: Diagnosis not present

## 2017-09-27 DIAGNOSIS — I083 Combined rheumatic disorders of mitral, aortic and tricuspid valves: Secondary | ICD-10-CM | POA: Diagnosis present

## 2017-09-27 DIAGNOSIS — I341 Nonrheumatic mitral (valve) prolapse: Secondary | ICD-10-CM | POA: Diagnosis not present

## 2017-09-27 DIAGNOSIS — Z9842 Cataract extraction status, left eye: Secondary | ICD-10-CM | POA: Diagnosis not present

## 2017-09-27 DIAGNOSIS — Z881 Allergy status to other antibiotic agents status: Secondary | ICD-10-CM | POA: Diagnosis not present

## 2017-09-27 LAB — CBC
HCT: 45.4 % (ref 36.0–46.0)
Hemoglobin: 14.5 g/dL (ref 12.0–15.0)
MCH: 31.2 pg (ref 26.0–34.0)
MCHC: 31.9 g/dL (ref 30.0–36.0)
MCV: 97.6 fL (ref 78.0–100.0)
Platelets: 207 10*3/uL (ref 150–400)
RBC: 4.65 MIL/uL (ref 3.87–5.11)
RDW: 12.8 % (ref 11.5–15.5)
WBC: 9.4 10*3/uL (ref 4.0–10.5)

## 2017-09-27 LAB — LIPID PANEL
Cholesterol: 221 mg/dL — ABNORMAL HIGH (ref 0–200)
HDL: 56 mg/dL (ref >40)
LDL Cholesterol: 152 mg/dL — ABNORMAL HIGH (ref 0–99)
Total CHOL/HDL Ratio: 3.9 RATIO
Triglycerides: 66 mg/dL (ref <150)
VLDL: 13 mg/dL (ref 0–40)

## 2017-09-27 LAB — TROPONIN I
TROPONIN I: 0.14 ng/mL — AB (ref <0.03)
TROPONIN I: 0.12 ng/mL — AB (ref <0.03)

## 2017-09-27 LAB — BASIC METABOLIC PANEL
ANION GAP: 12 (ref 5–15)
BUN: 13 mg/dL (ref 8–23)
CALCIUM: 8.8 mg/dL — AB (ref 8.9–10.3)
CO2: 22 mmol/L (ref 22–32)
CREATININE: 0.85 mg/dL (ref 0.44–1.00)
Chloride: 105 mmol/L (ref 98–111)
GFR calc Af Amer: >60 mL/min (ref >60)
GFR, EST NON AFRICAN AMERICAN: 59 mL/min — AB (ref >60)
GLUCOSE: 96 mg/dL (ref 70–99)
Potassium: 3.7 mmol/L (ref 3.5–5.1)
Sodium: 139 mmol/L (ref 135–145)

## 2017-09-27 LAB — HEPARIN LEVEL (UNFRACTIONATED)
HEPARIN UNFRACTIONATED: 0.59 [IU]/mL (ref 0.30–0.70)
HEPARIN UNFRACTIONATED: 0.55 [IU]/mL (ref 0.30–0.70)

## 2017-09-27 LAB — ECHOCARDIOGRAM COMPLETE
HEIGHTINCHES: 62 in
Weight: 1728 oz

## 2017-09-27 LAB — MAGNESIUM: MAGNESIUM: 1.9 mg/dL (ref 1.7–2.4)

## 2017-09-27 LAB — TSH: TSH: 6.839 u[IU]/mL — AB (ref 0.350–4.500)

## 2017-09-27 MED ORDER — APIXABAN 2.5 MG PO TABS
2.5000 mg | ORAL_TABLET | Freq: Two times a day (BID) | ORAL | Status: DC
  Administered 2017-09-27 – 2017-09-28 (×3): 2.5 mg via ORAL
  Filled 2017-09-27 (×4): qty 1

## 2017-09-27 MED ORDER — METOPROLOL TARTRATE 5 MG/5ML IV SOLN: 2.5000 mg | Freq: Four times a day (QID) | INTRAVENOUS | Status: DC | PRN

## 2017-09-27 MED ORDER — METOPROLOL TARTRATE 12.5 MG HALF TABLET
12.5000 mg | ORAL_TABLET | Freq: Four times a day (QID) | ORAL | Status: DC
  Administered 2017-09-27 – 2017-09-28 (×4): 12.5 mg via ORAL
  Filled 2017-09-27 (×4): qty 1

## 2017-09-27 MED ORDER — DIGOXIN 0.25 MG/ML IJ SOLN
0.1250 mg | Freq: Once | INTRAMUSCULAR | Status: AC
  Administered 2017-09-27: 0.125 mg via INTRAVENOUS
  Filled 2017-09-27: qty 2

## 2017-09-27 MED ORDER — SODIUM CHLORIDE 0.9 % IV SOLN
INTRAVENOUS | Status: AC
  Administered 2017-09-27: 10:00:00 via INTRAVENOUS

## 2017-09-27 MED ORDER — SODIUM CHLORIDE 0.9 % IV BOLUS
500.0000 mL | Freq: Once | INTRAVENOUS | Status: AC
  Administered 2017-09-27: 500 mL via INTRAVENOUS

## 2017-09-27 MED ORDER — METOPROLOL TARTRATE 5 MG/5ML IV SOLN
5.0000 mg | Freq: Four times a day (QID) | INTRAVENOUS | Status: DC
  Administered 2017-09-27: 5 mg via INTRAVENOUS
  Filled 2017-09-27: qty 5

## 2017-09-27 MED ORDER — SODIUM CHLORIDE 0.9 % IV SOLN
INTRAVENOUS | Status: DC
  Administered 2017-09-27 – 2017-09-28 (×2): via INTRAVENOUS

## 2017-09-27 MED ORDER — APIXABAN 5 MG PO TABS
5.0000 mg | ORAL_TABLET | Freq: Two times a day (BID) | ORAL | Status: DC
  Filled 2017-09-27: qty 1

## 2017-09-27 NOTE — ED Notes (Addendum)
Pt placed on hospital bed

## 2017-09-27 NOTE — Consult Note (Addendum)
Cardiology Consultation:   Patient ID: Lisa Lang; [id number]; [birth year]   Admit date: 09/26/2017 Date of Consult: 09/27/2017  Primary Care Provider: Lavone Orn, MD Primary Cardiologist: new, Dr Ellyn Hack Primary Electrophysiologist:  none   Patient Profile:   Lisa Lang is a 84 y.o. female with a hx of HLD, MVP, OA, diverticulitis, palpitations w/ PVCs on ECG in the setting of dehydration, who is being seen today for the evaluation of rapid atrial fib at the request of Dr Lisa Lang.  History of Present Illness:   Ms. Lisa Lang was in her USOH till yesterday. She had abdomnnal pain, but no N&V or diarrhea. She felt a little light-headed when she first got up in the morning, but not presyncopal or in danger of falling.   No awareness of tachycardia or irregular HR. No palpitations, no awareness of rapid or irregular HR. No chest pain or sob.   She went to her family physician yesterday because of the abdominal pain, her heart rate was elevated at up to 140 and she was in atrial fibrillation.  When her abdomen was evaluated, she had a left adnexal cyst, repeat evaluation in a year.  Her abdominal pain has improved.  She has gotten IV fluids for pressure support.  She was started on IV Cardizem.  She got a 0.25 mg dose of IV digoxin.  She also got metoprolol 5 mg IV.  Heart rate control has improved.  The IV Cardizem has been discontinued.  Heart rate is currently 90-100 most of the time.  She is resting quietly.  She currently feels well.  She is completely asymptomatic.   Past Medical History:  Diagnosis Date  . Arthritis   . Diverticulitis 1995   And 2007  . Diverticulosis   . Hyperlipidemia LDL goal <100   . Mitral valve prolapse    Past Surgical History:  Procedure Laterality Date  . CATARACT EXTRACTION, BILATERAL       Prior to Admission medications   Medication Sig Start Date End Date Taking? Authorizing Provider  alendronate (FOSAMAX) 70 MG tablet Take 1 tablet by  mouth once a week. 09/10/17  Yes [provider]  aspirin 81 MG tablet Take 81 mg by mouth daily.   Yes [provider]  atorvastatin (LIPITOR) 40 MG tablet Take 40 mg by mouth at bedtime. 09/05/17  Yes [provider]  calcium carbonate (OS-CAL - DOSED IN MG OF ELEMENTAL CALCIUM) 1250 MG tablet Take 1 tablet by mouth daily with breakfast.   Yes [provider]  Multiple Vitamins-Minerals (MULTIVITAMIN PO) Take 1 tablet by mouth daily.   Yes [provider]  Vitamin D, Ergocalciferol, 2000 units CAPS Take 2,000 Units by mouth daily. Twice a month    Yes [provider]  loperamide (IMODIUM) 2 MG capsule Take 1 capsule (2 mg total) by mouth 4 (four) times daily as needed for diarrhea or loose stools. Patient not taking: Reported on 09/26/2017 08/22/12   Orpah Greek, MD  ondansetron (ZOFRAN) 4 MG tablet Take 1 tablet (4 mg total) by mouth every 6 (six) hours. Patient not taking: Reported on 09/26/2017 08/22/12   Orpah Greek, MD    Inpatient Medications: Scheduled Meds: . atorvastatin  40 mg Oral QHS  . metoprolol tartrate  5 mg Intravenous Q6H   Continuous Infusions: . sodium chloride 100 mL/hr at 09/27/17 1011  . sodium chloride    . heparin 700 Units/hr (09/26/17 2035)   PRN Meds: acetaminophen, ondansetron (ZOFRAN) IV  Allergies:    Allergies  Allergen Reactions  . Amoxicillin   . Ciprofloxacin   . Lidocaine   . Sulfa Antibiotics     Social History:   Social History   Socioeconomic History  . Marital status: Married    Spouse name: Not on file  . Number of children: Not on file  . Years of education: Not on file  . Highest education level: Not on file  Occupational History  . Occupation: Retired  Scientific laboratory technician  . Financial resource strain: Not on file  . Food insecurity:    Worry: Not on file    Inability: Not on file  . Transportation needs:    Medical: Not on file    Non-medical: Not on file    Tobacco Use  . Smoking status: Never Smoker  . Smokeless tobacco: Never Used  Substance and Sexual Activity  . Alcohol use: No  . Drug use: No  . Sexual activity: Not on file  Lifestyle  . Physical activity:    Days per week: Not on file    Minutes per session: Not on file  . Stress: Not on file  Relationships  . Social connections:    Talks on phone: Not on file    Gets together: Not on file    Attends religious service: Not on file    Active member of club or organization: Not on file    Attends meetings of clubs or organizations: Not on file    Relationship status: Not on file  . Intimate partner violence:    Fear of current or ex partner: Not on file    Emotionally abused: Not on file    Physically abused: Not on file    Forced sexual activity: Not on file  Other Topics Concern  . Not on file  Social History Narrative   Pt lives w/ husband, she is his caregiver, he has dementia.    Family History:   Family History  Problem Relation Age of Onset  . Cervical cancer Mother   . CAD Father   . Lung cancer Sister   . Stroke Neg Hx   . Diabetes Neg Hx    Family Status:  Family Status  Relation Name Status  . Mother  Deceased  . Father  Deceased  . Sister  Deceased  . Neg Hx  (Not Specified)    ROS:  Please see the history of present illness.  All other ROS reviewed and negative.     Physical Exam/Data:   Vitals:   09/27/17 1115 09/27/17 1145 09/27/17 1215 09/27/17 1230  BP: 113/74 119/71 118/74 118/77  Pulse: (!) 102 (!) 117 (!) 117 (!) 121  Resp: 12 16 18 15   Temp:      TempSrc:      SpO2: 95% 97% 95% 95%  Weight:      Height:        Intake/Output Summary (Last 24 hours) at 09/27/2017 1416 Last data filed at 09/27/2017 1249 Gross per 24 hour  Intake 1858.75 ml  Output -  Net 1858.75 ml   Filed Weights   09/26/17 2000  Weight: 49 kg   Body mass index is 19.75 kg/m.  General:  Well nourished, well developed, in no acute distress HEENT:  normal Lymph: no adenopathy Neck: no JVD Endocrine:  No thryomegaly Vascular: No carotid bruits; 4/4 extremity pulses 2+, without bruits  Cardiac:  normal S1, S2; irregular rate and rhythm; 2/6 murmur  Lungs:  clear to  auscultation bilaterally, no wheezing, rhonchi, few scattered rales  Abd: soft, nontender, no hepatomegaly  Ext: no edema Musculoskeletal:  No deformities, BUE and BLE strength normal and equal Skin: warm and dry  Neuro:  CNs 2-12 intact, no focal abnormalities noted Psych:  Normal affect   EKG:  The EKG was personally reviewed and demonstrates: 9/23 EMS ECG with atrial fib, heart rate 121.  ER ECG, 9/23 is coarse atrial fib versus atypical atrial flutter, heart rate 120 Telemetry:  Telemetry was personally reviewed and demonstrates: Atrial fib and possible atrial flutter.  Relevant CV Studies:  ECHO: 03/11/2017 - Left ventricle: The cavity size was normal. Wall thickness was   normal. Systolic function was normal. The estimated ejection   fraction was in the range of 60% to 65%. Wall motion was normal;   there were no regional wall motion abnormalities. Doppler   parameters are consistent with abnormal left ventricular   relaxation (grade 1 diastolic dysfunction). Doppler parameters   are consistent with high ventricular filling pressure. - Aortic valve: There was mild regurgitation. - Mitral valve: Prolapse, involving the anterior leaflet and the   posterior leaflet. There was mild regurgitation. - Left atrium: The atrium was mildly dilated. - Right atrium: The atrium was mildly dilated. - Pulmonary arteries: Systolic pressure was mildly increased.  Impressions:  - Normal LV sysolic function; mild diastolic dysfunction; mild AI;   bileaflet MVP with mild MR; mild biatrial enlargement; mild TR   with mild pulmonary hypertension.  Laboratory Data:  Chemistry Recent Labs  Lab 09/26/17 1255 09/27/17 0829  NA 139 139  K 4.6 3.7  CL 102 105  CO2 25 22   GLUCOSE 110* 96  BUN 22 13  CREATININE 1.05* 0.85  CALCIUM 9.5 8.8*  GFRNONAA 46* 59*  GFRAA 53* >60  ANIONGAP 12 12    Lab Results  Component Value Date   ALT 17 09/26/2017   AST 35 09/26/2017   ALKPHOS 85 09/26/2017   BILITOT 1.3 (H) 09/26/2017   Hematology Recent Labs  Lab 09/26/17 1255 09/27/17 0829  WBC 9.9 9.4  RBC 4.70 4.65  HGB 14.3 14.5  HCT 46.1* 45.4  MCV 98.1 97.6  MCH 30.4 31.2  MCHC 31.0 31.9  RDW 12.6 12.8  PLT 192 207   Cardiac Enzymes Recent Labs  Lab 09/26/17 2030 09/27/17 0423 09/27/17 0829  TROPONINI 0.16* 0.14* 0.12*    Recent Labs  Lab 09/26/17 1308  TROPIPOC 0.07    TSH:  Lab Results  Component Value Date   TSH 6.839 (H) 09/27/2017   Lipids: Lab Results  Component Value Date   CHOL 221 (H) 09/27/2017   HDL 56 09/27/2017   LDLCALC 152 (H) 09/27/2017   TRIG 66 09/27/2017   CHOLHDL 3.9 09/27/2017    Radiology/Studies:  Dg Chest 2 View  Result Date: 09/26/2017 CLINICAL DATA:  Shortness of breath. EXAM: CHEST - 2 VIEW COMPARISON:  Radiographs of May 02, 2008. FINDINGS: Stable cardiomediastinal silhouette. No pneumothorax or pleural effusion is noted. Both lungs are clear. The visualized skeletal structures are unremarkable. IMPRESSION: No active cardiopulmonary disease. Electronically Signed   By: Marijo Conception, M.D.   On: 09/26/2017 13:28   US Transvaginal Non-ob  Result Date: 09/26/2017 CLINICAL DATA:  Left lower quadrant pain, no prior surgery, cyst seen on same day CT. EXAM: TRANSABDOMINAL AND TRANSVAGINAL ULTRASOUND OF PELVIS TECHNIQUE: Both transabdominal and transvaginal ultrasound examinations of the pelvis were performed. Transabdominal technique was performed for global imaging of the  pelvis including uterus, ovaries, adnexal regions, and pelvic cul-de-sac. It was necessary to proceed with endovaginal exam following the transabdominal exam to visualize the ovaries. COMPARISON:  Pelvic CT from earlier on the same day.  FINDINGS: Uterus Measurements: 7.5 x 2.1 x 4.2 cm. No fibroids or other mass visualized. Endometrium Thickness: 2.1 mm.  No focal abnormality visualized. Right ovary Unable to be visualized due to adjacent bowel bowel gas. Left ovary There is an anechoic left adnexal cyst likely of ovarian etiology measuring 3.9 x 2.9 x 3 cm. It is anechoic in appearance without mural nodularity nor septations. What appears represent ovarian tissue measures at least 3.6 x 4.9 cm AP by transverse dimension. Endovaginal imaging for better characterization was attempted but was unsuccessful due to patient pain and difficulty advancing the probe. Other findings No abnormal free fluid. IMPRESSION: Anechoic simple appearing left adnexal cyst likely of ovarian etiology without worrisome features measuring 3.9 x 2.9 x 3 cm. Consensus guidelines recommend annual ultrasound evaluation given size of the cyst, postmenopausal status of the patient and without concerning features identified. Electronically Signed   By: Ashley Royalty M.D.   On: 09/26/2017 19:15   US Pelvis Complete  Result Date: 09/26/2017 CLINICAL DATA:  Left lower quadrant pain, no prior surgery, cyst seen on same day CT. EXAM: TRANSABDOMINAL AND TRANSVAGINAL ULTRASOUND OF PELVIS TECHNIQUE: Both transabdominal and transvaginal ultrasound examinations of the pelvis were performed. Transabdominal technique was performed for global imaging of the pelvis including uterus, ovaries, adnexal regions, and pelvic cul-de-sac. It was necessary to proceed with endovaginal exam following the transabdominal exam to visualize the ovaries. COMPARISON:  Pelvic CT from earlier on the same day. FINDINGS: Uterus Measurements: 7.5 x 2.1 x 4.2 cm. No fibroids or other mass visualized. Endometrium Thickness: 2.1 mm.  No focal abnormality visualized. Right ovary Unable to be visualized due to adjacent bowel bowel gas. Left ovary There is an anechoic left adnexal cyst likely of ovarian etiology  measuring 3.9 x 2.9 x 3 cm. It is anechoic in appearance without mural nodularity nor septations. What appears represent ovarian tissue measures at least 3.6 x 4.9 cm AP by transverse dimension. Endovaginal imaging for better characterization was attempted but was unsuccessful due to patient pain and difficulty advancing the probe. Other findings No abnormal free fluid. IMPRESSION: Anechoic simple appearing left adnexal cyst likely of ovarian etiology without worrisome features measuring 3.9 x 2.9 x 3 cm. Consensus guidelines recommend annual ultrasound evaluation given size of the cyst, postmenopausal status of the patient and without concerning features identified. Electronically Signed   By: Ashley Royalty M.D.   On: 09/26/2017 19:15   Ct Abdomen Pelvis W Contrast  Result Date: 09/26/2017 CLINICAL DATA:  84 year old with lower abdominal pain. History of diverticulosis. EXAM: CT ABDOMEN AND PELVIS WITH CONTRAST TECHNIQUE: Multidetector CT imaging of the abdomen and pelvis was performed using the standard protocol following bolus administration of intravenous contrast. CONTRAST:  13mL OMNIPAQUE IOHEXOL 300 MG/ML  SOLN COMPARISON:  CT 04/07/2010 FINDINGS: Lower chest: Stable 4 mm pleural-based nodule in left lower lobe. Otherwise, the lung bases are clear. Hepatobiliary: Common bile duct is mildly dilated measuring up to 8 mm. No clear evidence for an obstructing stone or lesion. Gallbladder is mildly distended without inflammatory changes. Normal appearance of the liver. Portal venous system is patent. Pancreas: Minimal dilatation of the main pancreatic duct without inflammatory changes. No suspicious pancreatic lesion. Spleen: Normal in size without focal abnormality. Adrenals/Urinary Tract: Normal adrenal glands. Normal appearance both  kidneys. Urinary bladder is unremarkable. Stomach/Bowel: Wall thickening involving the sigmoid colon on sequence 3, image 55. Wall thickening has not significantly changed since  2012. No evidence for acute colonic inflammation. Mild wall thickening of the stomach but the stomach is also decompressed. No gross abnormality to the small bowel. Vascular/Lymphatic: Atherosclerotic calcifications in the thoracic and abdominal aorta. Main visceral arteries are patent. No significant lymph node enlargement. Prominent bilateral ovarian veins, left side greater than right. Reproductive: Low-density structure involving the left adnexa has enlarged since 2012. This structure measures 3.6 x 2.9 cm and previously measured up to 1.6 cm. No gross abnormality to the uterus or right adnexa. Other: Negative for free fluid.  Negative for free air. Musculoskeletal: Chronic disc space narrowing at L5-S1. No suspicious bone lesions. IMPRESSION: 1. No acute abnormality in the abdomen or pelvis. 2. Wall thickening of the sigmoid colon appears chronic and compatible with diverticulosis. No evidence for acute colonic inflammation 3. **An incidental finding of potential clinical significance has been found. Enlargement of a low-density left adnexal structure. This is probably a cystic structure. Recommend non-emergent pelvic ultrasound to confirm that this is a simple cystic structure.** 4. Biliary dilatation without evidence for an obstructing stone, lesion or gallbladder inflammation. 5. Nonspecific gastric wall thickening which may be related to under distention. 6.  Aortic Atherosclerosis (ICD10-I70.0). Electronically Signed   By: Markus Daft M.D.   On: 09/26/2017 16:19    Assessment and Plan:   Active Problems: 1.   Atrial fibrillation with RVR (HCC) -TSH is mildly elevated, do not feel that this is significantly contributing - MVP may be contributing, explained that this is also a disease of aging. - HR was easily controlled with low dose Cardizem, BB and dig. - Can give her another 0.25 mg dig and start 0.125 mg qd tomorrow - Metoprolol 12.5 mg qid for now, consolidate to 25 mg bid if BP tolerates -  CHA2DS2-VASc = 3 (age x 2, female) - Start Eliquis per pharmacy, can DC heparin - Atria are only mildly dilated on echo, she may convert spontaneously.  If not, consider cardioversion in 3 weeks as outpatient  2.  Elevated troponin: - EF is normal on echo with no wall motion abnormalities -Patient has a good activity and regularly climbs stairs without chest pain or shortness of breath - Feel the elevated troponin is demand ischemia secondary to elevated heart rate. -Reserve ischemic evaluation for recurrent symptoms  Otherwise, per IM.  From a cardiology standpoint, possible DC in a.m. if she continues to do well.  Okay for telemetry, do not think stepdown needed   Ovarian cyst   Elevated troponin   A-fib (HCC)    Signed, Rosaria Ferries, PA-C  09/27/2017 2:16 PM   I have seen, examined and evaluated the patient this PM along with Rosaria Ferries, PA-C.  After reviewing all the available data and chart, we discussed the patients laboratory, study & physical findings as well as symptoms in detail. I agree with her findings, examination as well as impression recommendations as per our discussion.     Very pleasant healthy 84 year old woman who presents here now with persistent atrial fibrillation that is likely new diagnosis.  She was initially RVR and did not tolerate diltiazem drip.  She has been treated in the ER with IV digoxin as well as metoprolol.  Has not tolerated significant rate control because of hypotension. Her main symptom was dizziness and lightheadedness, no chest tightness pressure or significant dyspnea.  Nothing  therefore to suggest heart failure.  I discussed with her the pathophysiology of atrial fibrillation and the concept of anticoagulation.  Based on her age and weight we will use 2.5 mg Eliquis twice daily. Plan would be to repeat challenge with IV digoxin and then start low-dose oral beta-blocker therapy can be consolidated once her rate controls or she  converts. Since we are not sure how long she has been in A. fib, would not be safe to cardiovert without a TEE.  I think we will try to hold off on cardioversion unless she becomes overly symptomatic.  Thankfully, her echocardiogram was relatively normal.  Therefore, the elevated troponin is probably more related to demand ischemia from A. fib and hypotension.  We will avoid ischemic evaluation in the absence of any active anginal symptoms.  Will follow-up in AM    Glenetta Hew, M.D., M.S. Interventional Cardiologist   Pager # [phone] Phone # [phone] [address]. [place], [place] [address]    For questions or updates, please contact [facility] Please consult [email or web address] for contact info under Cardiology/STEMI.

## 2017-09-27 NOTE — Progress Notes (Signed)
  Echocardiogram 2D Echocardiogram has been performed.  Lisa Lang M 09/27/2017, 10:09 AM

## 2017-09-27 NOTE — CV Procedure (Signed)
Echocardiogram not completed, patient wanted to eat breakfast first. Echo will be re-attempted  later this morning.  Darlina Sicilian RDCS

## 2017-09-27 NOTE — Progress Notes (Signed)
[facility] TEAM 1 - Stepdown/ICU TEAM  JAVERIA BRISKI  FGH:[id number] DOB: 19-Feb-1927 DOA: 09/26/2017 PCP: Lavone Orn, MD    Brief Narrative:  84 y.o. female with a hx of mitral valve prolapse and diverticulosis/litis who presented to her PCP w/ abdom pain and was found to be in Afib w/ RVR and was sent to the ED.  In the ED Afib was confirmed w/ a HR of 120.  CTabd noted an ovarian cyst.    Significant Events: 9/23 admit  9/24 TTE  Subjective: Resting comfortably in bed.  Denies cp, sob, n/v, or abdom pain at this time. Relates no awareness of palpitations or tachycardia.    Assessment & Plan:  Newly diagnosed Afib w/ RVR CHA2D-VASC score 3 - TSH is not suppressed - appears mildly volume depleted - correct volume depletion and follow - check Mg - currently off cardizem due to persisting hypotension - being dosed w/ digoxin x1 but do not intend to utilize this long term - will as Cardiology to see to determine if attempts at rapid return to NSR are warranted - check Mg  Elevated troponin Pattern not c/w ACS and is likely rate related - no chest pain whatsoever - no DOE - no indication for further evaluation   L Ovarian cyst - abdom pain anechoic left adnexal cyst likely of ovarian etiology measuring 3.9 x 2.9 x 3 cm noted on CT abdom and further defined on f/u US - annual f/u imaging is suggested   Mitral Valve Prolapse w/ mild regurg per TTE March 2019 TTE pending to re-evaluate, though I doubt this will have changed signif since her last TTE March 2019  DVT prophylaxis: IV heparin Code Status: FULL CODE Family Communication: no family present at time of exam  Disposition Plan: SDU (hypotension and persisting tachycardia)  Consultants:  none  Antimicrobials:  none   Objective: Blood pressure (!) 90/54, pulse (!) 117, temperature (!) 97.5 F (36.4 C), temperature source Oral, resp. rate (!) 21, height 5\' 2"  (1.575 m), weight 49 kg, SpO2 94 %.  Intake/Output Summary (Last  24 hours) at 09/27/2017 1035 Last data filed at 09/26/2017 1738 Gross per 24 hour  Intake 500 ml  Output -  Net 500 ml   Filed Weights   09/26/17 2000  Weight: 49 kg    Examination: General: No acute respiratory distress Lungs: Clear to auscultation bilaterally without wheezes or crackles Cardiovascular: irreg irreg w/ varying rates from 90-130 during exam  Abdomen: Nontender, nondistended, soft, bowel sounds positive, no rebound, no ascites, no appreciable mass Extremities: No significant cyanosis, clubbing, or edema bilateral lower extremities  CBC: Recent Labs  Lab 09/26/17 1255  WBC 9.9  HGB 14.3  HCT 46.1*  MCV 98.1  PLT 678   Basic Metabolic Panel: Recent Labs  Lab 09/26/17 1255 09/27/17 0829  NA 139 139  K 4.6 3.7  CL 102 105  CO2 25 22  GLUCOSE 110* 96  BUN 22 13  CREATININE 1.05* 0.85  CALCIUM 9.5 8.8*   GFR: Estimated Creatinine Clearance: 35.4 mL/min (by C-G formula based on SCr of 0.85 mg/dL).  Liver Function Tests: Recent Labs  Lab 09/26/17 1255  AST 35  ALT 17  ALKPHOS 85  BILITOT 1.3*  PROT 6.9  ALBUMIN 3.9   Recent Labs  Lab 09/26/17 1255  LIPASE 29    Cardiac Enzymes: Recent Labs  Lab 09/26/17 2030 09/27/17 0423 09/27/17 0829  TROPONINI 0.16* 0.14* 0.12*    Scheduled Meds: .  aspirin  81 mg Oral Daily  . atorvastatin  40 mg Oral QHS  . digoxin  0.125 mg Intravenous Once   Continuous Infusions: . sodium chloride 100 mL/hr at 09/27/17 1011  . heparin 700 Units/hr (09/26/17 2035)  . sodium chloride 500 mL (09/27/17 1012)     LOS: 0 days   Cherene Altes, MD [facility] Office  [phone] Pager - Text Page per Amion  If 7PM-7AM, please contact night-coverage per Amion 09/27/2017, 10:35 AM

## 2017-09-27 NOTE — Progress Notes (Addendum)
ANTICOAGULATION CONSULT NOTE - Follow Up Consult  Pharmacy Consult for apixaban Indication: atrial fibrillation  Labs: Recent Labs    09/26/17 1255 09/26/17 2030 09/27/17 0423 09/27/17 0829 09/27/17 1159  HGB 14.3  --   --  14.5  --   HCT 46.1*  --   --  45.4  --   PLT 192  --   --  207  --   HEPARINUNFRC  --   --  0.59  --  0.55  CREATININE 1.05*  --   --  0.85  --   TROPONINI  --  0.16* 0.14* 0.12*  --     Assessment:  84yo female therapeutic currently on IV heparin with initial dosing for new Afib. Initial HL this AM was therapeutic. Now transitioning patient to apixaban. H/H and Plt wnl. SCr 0.85. Wt 49 kg.   Plan: -Stop IV heparin  -Start apixaban 2.5 mg twice daily -Monitor CBC and s/s of bleeding   Albertina Parr, PharmD., BCPS Clinical Pharmacist Clinical phone for 09/27/17 until 11pm: [phone] If after 3:30pm, please refer to [facility] for unit-specific pharmacist

## 2017-09-27 NOTE — ED Notes (Signed)
Paged md about SBP 90s. Turned cardizem down to 2.5. Will cont to mon BP

## 2017-09-27 NOTE — Progress Notes (Signed)
ANTICOAGULATION CONSULT NOTE - Follow Up Consult  Pharmacy Consult for heparin Indication: atrial fibrillation  Labs: Recent Labs    09/26/17 1255 09/26/17 2030 09/27/17 0423  HGB 14.3  --   --   HCT 46.1*  --   --   PLT 192  --   --   HEPARINUNFRC  --   --  0.59  CREATININE 1.05*  --   --   TROPONINI  --  0.16*  --     Assessment/Plan:  84yo female therapeutic on heparin with initial dosing for new Afib. Will continue gtt at current rate and confirm stable with additional level.   Wynona Neat, PharmD, BCPS  09/27/2017,5:19 AM

## 2017-09-27 NOTE — ED Notes (Signed)
This RN paged attending re: BP and HR with Cardiazem infusing at 5mg /hr

## 2017-09-28 DIAGNOSIS — I341 Nonrheumatic mitral (valve) prolapse: Secondary | ICD-10-CM

## 2017-09-28 DIAGNOSIS — I340 Nonrheumatic mitral (valve) insufficiency: Secondary | ICD-10-CM

## 2017-09-28 DIAGNOSIS — N83202 Unspecified ovarian cyst, left side: Secondary | ICD-10-CM

## 2017-09-28 LAB — COMPREHENSIVE METABOLIC PANEL
ALT: 15 U/L (ref 0–44)
ANION GAP: 7 (ref 5–15)
AST: 29 U/L (ref 15–41)
Albumin: 2.8 g/dL — ABNORMAL LOW (ref 3.5–5.0)
Alkaline Phosphatase: 64 U/L (ref 38–126)
BUN: 16 mg/dL (ref 8–23)
CO2: 25 mmol/L (ref 22–32)
Calcium: 8.3 mg/dL — ABNORMAL LOW (ref 8.9–10.3)
Chloride: 110 mmol/L (ref 98–111)
Creatinine, Ser: 0.99 mg/dL (ref 0.44–1.00)
GFR calc non Af Amer: 49 mL/min — ABNORMAL LOW (ref >60)
GFR, EST AFRICAN AMERICAN: 57 mL/min — AB (ref >60)
Glucose, Bld: 98 mg/dL (ref 70–99)
POTASSIUM: 4.1 mmol/L (ref 3.5–5.1)
Sodium: 142 mmol/L (ref 135–145)
Total Bilirubin: 1.1 mg/dL (ref 0.3–1.2)
Total Protein: 5.2 g/dL — ABNORMAL LOW (ref 6.5–8.1)

## 2017-09-28 LAB — CBC
HCT: 39.6 % (ref 36.0–46.0)
Hemoglobin: 12.6 g/dL (ref 12.0–15.0)
MCH: 31.3 pg (ref 26.0–34.0)
MCHC: 31.8 g/dL (ref 30.0–36.0)
MCV: 98.3 fL (ref 78.0–100.0)
PLATELETS: 162 10*3/uL (ref 150–400)
RBC: 4.03 MIL/uL (ref 3.87–5.11)
RDW: 12.7 % (ref 11.5–15.5)
WBC: 6 10*3/uL (ref 4.0–10.5)

## 2017-09-28 MED ORDER — METOPROLOL TARTRATE 25 MG PO TABS: 25.0000 mg | ORAL_TABLET | Freq: Two times a day (BID) | ORAL | 0 refills | Status: DC

## 2017-09-28 MED ORDER — APIXABAN 2.5 MG PO TABS: 2.5000 mg | ORAL_TABLET | Freq: Two times a day (BID) | ORAL | 0 refills | Status: DC

## 2017-09-28 MED ORDER — METOPROLOL TARTRATE 25 MG PO TABS: 25.0000 mg | ORAL_TABLET | Freq: Two times a day (BID) | ORAL | Status: DC

## 2017-09-28 NOTE — Progress Notes (Addendum)
Progress Note  Patient Name: Lisa Lang Date of Encounter: 09/28/2017  Primary Cardiologist: Glenetta Hew, MD    Subjective   Doing much better this morning. Spontaneous conversion back to NSR overnight. No symptoms.   Inpatient Medications    Scheduled Meds: . apixaban  2.5 mg Oral BID  . atorvastatin  40 mg Oral QHS  . metoprolol tartrate  12.5 mg Oral Q6H   Continuous Infusions: . sodium chloride 50 mL/hr at 09/28/17 0629   PRN Meds: acetaminophen, metoprolol tartrate, ondansetron (ZOFRAN) IV   Vital Signs    Vitals:   09/27/17 1809 09/27/17 2004 09/28/17 0017 09/28/17 0626  BP: 111/68 112/62 (!) 103/58 (!) 102/51  Pulse: (!) 118 87  69  Resp: (!) 24 20  (!) 25  Temp:  98.6 F (37 C)    TempSrc:  Oral    SpO2: 97% 95%    Weight:    49.9 kg  Height:        Intake/Output Summary (Last 24 hours) at 09/28/2017 0725 Last data filed at 09/28/2017 0650 Gross per 24 hour  Intake 1958.75 ml  Output 700 ml  Net 1258.75 ml   Filed Weights   09/26/17 2000 09/28/17 0626  Weight: 49 kg 49.9 kg    Telemetry    NSR 60s-70s - Personally Reviewed  ECG    Admit EKG Afib/flutter- Personally Reviewed  Physical Exam   GEN: Elderly, thin WF, in No acute distress.   Neck: No JVD Cardiac: RRR, no murmurs, rubs, or gallops.  Respiratory: Clear to auscultation bilaterally. GI: Soft, nontender, non-distended  MS: No edema; No deformity. Neuro:  Nonfocal  Psych: Normal affect   Labs    Chemistry Recent Labs  Lab 09/26/17 1255 09/27/17 0829 09/28/17 0413  NA 139 139 142  K 4.6 3.7 4.1  CL 102 105 110  CO2 25 22 25   GLUCOSE 110* 96 98  BUN 22 13 16   CREATININE 1.05* 0.85 0.99  CALCIUM 9.5 8.8* 8.3*  PROT 6.9  --  5.2*  ALBUMIN 3.9  --  2.8*  AST 35  --  29  ALT 17  --  15  ALKPHOS 85  --  64  BILITOT 1.3*  --  1.1  GFRNONAA 46* 59* 49*  GFRAA 53* >60 57*  ANIONGAP 12 12 7      Hematology Recent Labs  Lab 09/26/17 1255 09/27/17 0829  09/28/17 0413  WBC 9.9 9.4 6.0  RBC 4.70 4.65 4.03  HGB 14.3 14.5 12.6  HCT 46.1* 45.4 39.6  MCV 98.1 97.6 98.3  MCH 30.4 31.2 31.3  MCHC 31.0 31.9 31.8  RDW 12.6 12.8 12.7  PLT 192 207 162    Cardiac Enzymes Recent Labs  Lab 09/26/17 2030 09/27/17 0423 09/27/17 0829  TROPONINI 0.16* 0.14* 0.12*    Recent Labs  Lab 09/26/17 1308  TROPIPOC 0.07     BNPNo results for input(s): BNP, PROBNP in the last 168 hours.   DDimer No results for input(s): DDIMER in the last 168 hours.   Radiology    Dg Chest 2 View  Result Date: 09/26/2017 CLINICAL DATA:  Shortness of breath. EXAM: CHEST - 2 VIEW COMPARISON:  Radiographs of May 02, 2008. FINDINGS: Stable cardiomediastinal silhouette. No pneumothorax or pleural effusion is noted. Both lungs are clear. The visualized skeletal structures are unremarkable. IMPRESSION: No active cardiopulmonary disease. Electronically Signed   By: Marijo Conception, M.D.   On: 09/26/2017 13:28   US Transvaginal Non-ob  Result Date: 09/26/2017 CLINICAL DATA:  Left lower quadrant pain, no prior surgery, cyst seen on same day CT. EXAM: TRANSABDOMINAL AND TRANSVAGINAL ULTRASOUND OF PELVIS TECHNIQUE: Both transabdominal and transvaginal ultrasound examinations of the pelvis were performed. Transabdominal technique was performed for global imaging of the pelvis including uterus, ovaries, adnexal regions, and pelvic cul-de-sac. It was necessary to proceed with endovaginal exam following the transabdominal exam to visualize the ovaries. COMPARISON:  Pelvic CT from earlier on the same day. FINDINGS: Uterus Measurements: 7.5 x 2.1 x 4.2 cm. No fibroids or other mass visualized. Endometrium Thickness: 2.1 mm.  No focal abnormality visualized. Right ovary Unable to be visualized due to adjacent bowel bowel gas. Left ovary There is an anechoic left adnexal cyst likely of ovarian etiology measuring 3.9 x 2.9 x 3 cm. It is anechoic in appearance without mural nodularity nor  septations. What appears represent ovarian tissue measures at least 3.6 x 4.9 cm AP by transverse dimension. Endovaginal imaging for better characterization was attempted but was unsuccessful due to patient pain and difficulty advancing the probe. Other findings No abnormal free fluid. IMPRESSION: Anechoic simple appearing left adnexal cyst likely of ovarian etiology without worrisome features measuring 3.9 x 2.9 x 3 cm. Consensus guidelines recommend annual ultrasound evaluation given size of the cyst, postmenopausal status of the patient and without concerning features identified. Electronically Signed   By: Ashley Royalty M.D.   On: 09/26/2017 19:15   US Pelvis Complete  Result Date: 09/26/2017 CLINICAL DATA:  Left lower quadrant pain, no prior surgery, cyst seen on same day CT. EXAM: TRANSABDOMINAL AND TRANSVAGINAL ULTRASOUND OF PELVIS TECHNIQUE: Both transabdominal and transvaginal ultrasound examinations of the pelvis were performed. Transabdominal technique was performed for global imaging of the pelvis including uterus, ovaries, adnexal regions, and pelvic cul-de-sac. It was necessary to proceed with endovaginal exam following the transabdominal exam to visualize the ovaries. COMPARISON:  Pelvic CT from earlier on the same day. FINDINGS: Uterus Measurements: 7.5 x 2.1 x 4.2 cm. No fibroids or other mass visualized. Endometrium Thickness: 2.1 mm.  No focal abnormality visualized. Right ovary Unable to be visualized due to adjacent bowel bowel gas. Left ovary There is an anechoic left adnexal cyst likely of ovarian etiology measuring 3.9 x 2.9 x 3 cm. It is anechoic in appearance without mural nodularity nor septations. What appears represent ovarian tissue measures at least 3.6 x 4.9 cm AP by transverse dimension. Endovaginal imaging for better characterization was attempted but was unsuccessful due to patient pain and difficulty advancing the probe. Other findings No abnormal free fluid. IMPRESSION:  Anechoic simple appearing left adnexal cyst likely of ovarian etiology without worrisome features measuring 3.9 x 2.9 x 3 cm. Consensus guidelines recommend annual ultrasound evaluation given size of the cyst, postmenopausal status of the patient and without concerning features identified. Electronically Signed   By: Ashley Royalty M.D.   On: 09/26/2017 19:15   Ct Abdomen Pelvis W Contrast  Result Date: 09/26/2017 CLINICAL DATA:  84 year old with lower abdominal pain. History of diverticulosis. EXAM: CT ABDOMEN AND PELVIS WITH CONTRAST TECHNIQUE: Multidetector CT imaging of the abdomen and pelvis was performed using the standard protocol following bolus administration of intravenous contrast. CONTRAST:  15mL OMNIPAQUE IOHEXOL 300 MG/ML  SOLN COMPARISON:  CT 04/07/2010 FINDINGS: Lower chest: Stable 4 mm pleural-based nodule in left lower lobe. Otherwise, the lung bases are clear. Hepatobiliary: Common bile duct is mildly dilated measuring up to 8 mm. No clear evidence for an obstructing stone or lesion.  Gallbladder is mildly distended without inflammatory changes. Normal appearance of the liver. Portal venous system is patent. Pancreas: Minimal dilatation of the main pancreatic duct without inflammatory changes. No suspicious pancreatic lesion. Spleen: Normal in size without focal abnormality. Adrenals/Urinary Tract: Normal adrenal glands. Normal appearance both kidneys. Urinary bladder is unremarkable. Stomach/Bowel: Wall thickening involving the sigmoid colon on sequence 3, image 55. Wall thickening has not significantly changed since 2012. No evidence for acute colonic inflammation. Mild wall thickening of the stomach but the stomach is also decompressed. No gross abnormality to the small bowel. Vascular/Lymphatic: Atherosclerotic calcifications in the thoracic and abdominal aorta. Main visceral arteries are patent. No significant lymph node enlargement. Prominent bilateral ovarian veins, left side greater than  right. Reproductive: Low-density structure involving the left adnexa has enlarged since 2012. This structure measures 3.6 x 2.9 cm and previously measured up to 1.6 cm. No gross abnormality to the uterus or right adnexa. Other: Negative for free fluid.  Negative for free air. Musculoskeletal: Chronic disc space narrowing at L5-S1. No suspicious bone lesions. IMPRESSION: 1. No acute abnormality in the abdomen or pelvis. 2. Wall thickening of the sigmoid colon appears chronic and compatible with diverticulosis. No evidence for acute colonic inflammation 3. **An incidental finding of potential clinical significance has been found. Enlargement of a low-density left adnexal structure. This is probably a cystic structure. Recommend non-emergent pelvic ultrasound to confirm that this is a simple cystic structure.** 4. Biliary dilatation without evidence for an obstructing stone, lesion or gallbladder inflammation. 5. Nonspecific gastric wall thickening which may be related to under distention. 6.  Aortic Atherosclerosis (ICD10-I70.0). Electronically Signed   By: Markus Daft M.D.   On: 09/26/2017 16:19    Cardiac Studies   2D Echo 09/27/17 Study Conclusions  - Left ventricle: The cavity size was normal. There was moderate   concentric hypertrophy. Systolic function was normal. The   estimated ejection fraction was in the range of 60% to 65%. Wall   motion was normal; there were no regional wall motion   abnormalities. The study is not technically sufficient to allow   evaluation of LV diastolic function. Doppler parameters are   consistent with high ventricular filling pressure. - Aortic valve: Moderately calcified annulus. Trileaflet; mildly   thickened, moderately calcified leaflets. There was moderate   regurgitation. Regurgitation pressure half-time: 389 ms. - Mitral valve: Calcified annulus. Diffuse thickening of the   anterior leaflet, consistent with myxomatous proliferation.   Moderate,  holosystolicprolapse, involving the anterior leaflet   and the posterior leaflet. There was mild to moderate   regurgitation directed eccentrically and toward the free wall. - Right atrium: The atrium was severely dilated. - Tricuspid valve: There was mild-moderate regurgitation. - Pulmonic valve: There was trivial regurgitation. - Pulmonary arteries: PA peak pressure: 39 mm Hg (S). Left atrium:  The atrium was normal in size.  Patient Profile     84 y.o. female with a hx of HLD, MVP, OA, diverticulitis, palpitations w/ PVCs on ECG in the setting of dehydration, who is being seen today for the evaluation of rapid atrial fib at the request of Dr Thereasa Solo.  Assessment & Plan    1. Atrial Fibrillation w/ RVR: spontaneous conversion back to NSR overnight. HR in the 60s-70s. BP in the 765Y systolic. Asymptomatic.  TSH low. H/H and WBC WNL. K normal. SCr/BUN normal. CXR and UA negative. Continue rate control with low dose metoprolol -converted from 12.5 mg 4 times daily to 25 mg BID.  Eliquis  initiated for stroke prophylaxis given CHA2DS2 VASc score > 3 (Age >23 and female sex), low dose 2.5 mg BID given age >69 and wt < 60 kg. Continue on discharge. Stop home ASA. We will arrange post hospital f/u in our office with f/u BMP and CBC in a week.   2. Elevated Troponin: mildly elevated but relatively flat trend, 0.16>>0.14>>0.12. Echo shows normal LVEF and normal wall motion. No CP. Suspect demand ischemia from rapid afib and hypotension. No plans for ischemic evaluation at this time, given absence of chest pain.   3. Valvular Disease: - can be followed outpatient. Defer to primary cardiologist. Essentially asymptomatic until in Afib.  Aortic Insuffiencey: moderate on echo. Normal Aortic root size  MVP/ MR: moderate MVP and mild to moderate MR noted on echo   Plan for now will be to monitor symptoms & reassess Echo in ~6-12 months  TR: mild to moderate   4. HLD: LDL elevated at 152 mg/dL. She is  on Lipitor 40 mg and reports full compliance. No h/o CAD. Will defer to Dr. Ellyn Hack further titration to 80 mg in this elderly pt. We also discussed low fat diet.   5. Abnormal Thyroid Function Test: TSH elevated at 6.83. Will defer further work-up/ management to primary team. Echo shows no effusion.      Signed, Lyda Jester, PA-C  09/28/2017, 7:25 AM     I have seen, examined and evaluated the patient this PM along with Ellen Henri, PA-C.  After reviewing all the available data and chart, we discussed the patients laboratory, study & physical findings as well as symptoms in detail. I agree with her findings, examination as well as impression recommendations as per our discussion.    Attending adjustments noted in italics.   Feels well after spontaneous conversion to sinus rhythm.  Rate borderline, but still stable. Consolidated beta-blocker to 25 mg twice daily metoprolol. Started on Eliquis.  Aspirin stopped.  Valve disease is somewhat concerning, but as she is relatively asymptomatic 84 years old, I think we will simply follow and recheck echocardiogram in 6 to 12 months.  She has not had any signs or symptoms of heart failure issues prior to this.  I do suspect that the reason why her troponin levels are elevated in the setting of A. fib RVR that was prolonged.  I think she stable for discharge from a cardiac standpoint.  [facility] will sign off.   Medication Recommendations: Consolidate metoprolol r 25 mg p.o. twice daily; Eliquis 2.5 mg twice daily Other recommendations (labs, testing, etc): None prior to follow-up Follow up as an outpatient: Will be arranged at [facility] [place] with Glenetta Hew, MD or APP      Glenetta Hew, M.D., M.S. Interventional Cardiologist   Pager # [phone] Phone # [phone] [address] [place], [place] [address]  For questions or updates, please contact [place] Please consult [email or web address] for  contact info under

## 2017-09-28 NOTE — Plan of Care (Signed)
  Problem: Education: Goal: Knowledge of General Education information will improve Description Including pain rating scale, medication(s)/side effects and non-pharmacologic comfort measures Outcome: Progressing Note:  POC reviewed with pt.   

## 2017-09-28 NOTE — Care Management (Signed)
09-28-17  BENEFIT CHECK :  # 1.  S/W  MONIA  @ [employer] # [phone]  ELIQUIS  2.5 MG BID COVER- YES CO--PAY- $ 28.00   TIER- 1 DRUG PRIOR APPROVAL- NO  APIXABAN; NONE FORMULARY  NO DEDUCTIBLE  PREFERRED PHARMACY : YES [facility] AND [facility] 90 DAY SUPPLY  FOR M/O $ 24.00 TWO REFILL AT RETAIL THEN M/O

## 2017-09-28 NOTE — Progress Notes (Signed)
Pt converted to NSR with rate of 70 overnight.

## 2017-09-28 NOTE — Discharge Summary (Signed)
Physician Discharge Summary  Lisa Lang  [facility]:[id number]  DOB: [birth year]  DOA: 09/26/2017 PCP: Lavone Orn, MD  Admit date: 09/26/2017 Discharge date: 09/28/2017  Admitted From: Home  Disposition: Home   Recommendations for Outpatient Follow-up:  1. Follow up with PCP in 1 week  2. Follow up with cardiology, they will arrange appointment.  Discharge Condition: Stable CODE STATUS: Full code Diet recommendation: Heart Healthy   Brief/Interim Summary: For full details see H&P/Progress note, but in brief, Lisa Lang is a 84 year old female with medical history of mitral bladder prolapse and diverticulosis who was sent by her PCP after being evaluated for abdominal pain and found to be in A. fib with RVR.  Upon ED evaluation EKG confirmed A. fib with RVR, CT of the abdomen shows ovarian cyst and patient was admitted with working diagnosis of new A. fib.  Cardiology was consulted, patient spontaneously converted to NSR.  Troponins were found to be elevated however felt to be demand ischemia and no ischemic work-up was recommended at this point.  Patient clinically improved was deemed stable for discharge and follow-up with PCP.  Subjective: She is seen and examined, she is comfortably in bed, denies chest pain, shortness of breath, abdominal pain and palpitations.  Remains in normal sinus rhythm.  No acute events overnight.  Discharge Diagnoses/Hospital Course:  A. fib with RVR Patient initially started on Cardizem drip, subsequently spontaneously converted to NSR, cardiology was consulted.  Patient was started on low-dose metoprolol, will discharge on 25 mg twice daily.  Patient was also started on Eliquis for stroke prophylaxis ChadsVasc score > 3.  Cardiogram was obtained and shows EF of 60 to 65% with no wall motion abnormality.  Patient currently doing well and asymptomatic.  Patient deemed stable for discharge and follow-up with cardiology as an outpatient.  Elevated  troponin Pattern not consistent with ACS unlikely to be demand ischemia from A. fib.  Echocardiogram reassuring. Cardiology did not recommend further ischemic work-up at this point.  Patient asymptomatic.  Left ovarian cyst Felt to be causing abdominal pain, CT of the abdomen shows anechoic left adnexal cyst likely ovarian from etiology measuring 3.9 x 2.9 x 3 cm.  Recommended yearly follow-up.  Advised to follow-up with PCP  Mitral valve prolapse with mild regurgitation Relative asymptomatic, per cardiology recommending to check echocardiogram in 6 to 12 months.  No signs of heart failure at this point.  Follow-up as an outpatient.  On the day of the discharge the patient's vitals were stable, and no other acute medical condition were reported by patient. the patient was felt safe to be discharge to home.   Discharge Instructions  You were cared for by a hospitalist during your hospital stay. If you have any questions about your discharge medications or the care you received while you were in the hospital after you are discharged, you can call the unit and asked to speak with the hospitalist on call if the hospitalist that took care of you is not available. Once you are discharged, your primary care physician will handle any further medical issues. Please note that NO REFILLS for any discharge medications will be authorized once you are discharged, as it is imperative that you return to your primary care physician (or establish a relationship with a primary care physician if you do not have one) for your aftercare needs so that they can reassess your need for medications and monitor your lab values.  Discharge Instructions    Call MD for:  difficulty breathing, headache or visual disturbances   Complete by:  As directed    Call MD for:  extreme fatigue   Complete by:  As directed    Call MD for:  hives   Complete by:  As directed    Call MD for:  persistant dizziness or light-headedness    Complete by:  As directed    Call MD for:  persistant nausea and vomiting   Complete by:  As directed    Call MD for:  redness, tenderness, or signs of infection (pain, swelling, redness, odor or green/yellow discharge around incision site)   Complete by:  As directed    Call MD for:  severe uncontrolled pain   Complete by:  As directed    Call MD for:  temperature >100.4   Complete by:  As directed    Diet - low sodium heart healthy   Complete by:  As directed    Increase activity slowly   Complete by:  As directed      Allergies as of 09/28/2017      Reactions   Amoxicillin    Ciprofloxacin    Lidocaine    Sulfa Antibiotics       Medication List    STOP taking these medications   aspirin 81 MG tablet   loperamide 2 MG capsule Commonly known as:  IMODIUM   ondansetron 4 MG tablet Commonly known as:  ZOFRAN     TAKE these medications   alendronate 70 MG tablet Commonly known as:  FOSAMAX Take 1 tablet by mouth once a week.   apixaban 2.5 MG Tabs tablet Commonly known as:  ELIQUIS Take 1 tablet (2.5 mg total) by mouth 2 (two) times daily.   atorvastatin 40 MG tablet Commonly known as:  LIPITOR Take 40 mg by mouth at bedtime.   calcium carbonate 1250 (500 Ca) MG tablet Commonly known as:  OS-CAL - dosed in mg of elemental calcium Take 1 tablet by mouth daily with breakfast.   metoprolol tartrate 25 MG tablet Commonly known as:  LOPRESSOR Take 1 tablet (25 mg total) by mouth 2 (two) times daily.   MULTIVITAMIN PO Take 1 tablet by mouth daily.   Vitamin D (Ergocalciferol) 2000 units Caps Take 2,000 Units by mouth daily. Twice a month      Follow-up Information    Lavone Orn, MD. Schedule an appointment as soon as possible for a visit in 1 week(s).   Specialty:  Internal Medicine Why:  Hospital follow-up Contact information: [address] [place] [address] [phone]        Leonie Man, MD. Call in 3 day(s).    Specialty:  Cardiology Why:  Call in the next 3 days if you have not received an appointment from cardiology office. Contact information: [address] [place] [address] [phone]          Allergies  Allergen Reactions  . Amoxicillin   . Ciprofloxacin   . Lidocaine   . Sulfa Antibiotics     Consultations: Cardiology   Procedures/Studies: Dg Chest 2 View  Result Date: 09/26/2017 CLINICAL DATA:  Shortness of breath. EXAM: CHEST - 2 VIEW COMPARISON:  Radiographs of May 02, 2008. FINDINGS: Stable cardiomediastinal silhouette. No pneumothorax or pleural effusion is noted. Both lungs are clear. The visualized skeletal structures are unremarkable. IMPRESSION: No active cardiopulmonary disease. Electronically Signed   By: Marijo Conception, M.D.   On: 09/26/2017 13:28  US Transvaginal Non-ob  Result Date: 09/26/2017 CLINICAL DATA:  Left lower quadrant pain, no prior surgery, cyst seen on same day CT. EXAM: TRANSABDOMINAL AND TRANSVAGINAL ULTRASOUND OF PELVIS TECHNIQUE: Both transabdominal and transvaginal ultrasound examinations of the pelvis were performed. Transabdominal technique was performed for global imaging of the pelvis including uterus, ovaries, adnexal regions, and pelvic cul-de-sac. It was necessary to proceed with endovaginal exam following the transabdominal exam to visualize the ovaries. COMPARISON:  Pelvic CT from earlier on the same day. FINDINGS: Uterus Measurements: 7.5 x 2.1 x 4.2 cm. No fibroids or other mass visualized. Endometrium Thickness: 2.1 mm.  No focal abnormality visualized. Right ovary Unable to be visualized due to adjacent bowel bowel gas. Left ovary There is an anechoic left adnexal cyst likely of ovarian etiology measuring 3.9 x 2.9 x 3 cm. It is anechoic in appearance without mural nodularity nor septations. What appears represent ovarian tissue measures at least 3.6 x 4.9 cm AP by transverse dimension. Endovaginal imaging for better  characterization was attempted but was unsuccessful due to patient pain and difficulty advancing the probe. Other findings No abnormal free fluid. IMPRESSION: Anechoic simple appearing left adnexal cyst likely of ovarian etiology without worrisome features measuring 3.9 x 2.9 x 3 cm. Consensus guidelines recommend annual ultrasound evaluation given size of the cyst, postmenopausal status of the patient and without concerning features identified. Electronically Signed   By: Ashley Royalty M.D.   On: 09/26/2017 19:15   US Pelvis Complete  Result Date: 09/26/2017 CLINICAL DATA:  Left lower quadrant pain, no prior surgery, cyst seen on same day CT. EXAM: TRANSABDOMINAL AND TRANSVAGINAL ULTRASOUND OF PELVIS TECHNIQUE: Both transabdominal and transvaginal ultrasound examinations of the pelvis were performed. Transabdominal technique was performed for global imaging of the pelvis including uterus, ovaries, adnexal regions, and pelvic cul-de-sac. It was necessary to proceed with endovaginal exam following the transabdominal exam to visualize the ovaries. COMPARISON:  Pelvic CT from earlier on the same day. FINDINGS: Uterus Measurements: 7.5 x 2.1 x 4.2 cm. No fibroids or other mass visualized. Endometrium Thickness: 2.1 mm.  No focal abnormality visualized. Right ovary Unable to be visualized due to adjacent bowel bowel gas. Left ovary There is an anechoic left adnexal cyst likely of ovarian etiology measuring 3.9 x 2.9 x 3 cm. It is anechoic in appearance without mural nodularity nor septations. What appears represent ovarian tissue measures at least 3.6 x 4.9 cm AP by transverse dimension. Endovaginal imaging for better characterization was attempted but was unsuccessful due to patient pain and difficulty advancing the probe. Other findings No abnormal free fluid. IMPRESSION: Anechoic simple appearing left adnexal cyst likely of ovarian etiology without worrisome features measuring 3.9 x 2.9 x 3 cm. Consensus guidelines  recommend annual ultrasound evaluation given size of the cyst, postmenopausal status of the patient and without concerning features identified. Electronically Signed   By: Ashley Royalty M.D.   On: 09/26/2017 19:15   Ct Abdomen Pelvis W Contrast  Result Date: 09/26/2017 CLINICAL DATA:  84 year old with lower abdominal pain. History of diverticulosis. EXAM: CT ABDOMEN AND PELVIS WITH CONTRAST TECHNIQUE: Multidetector CT imaging of the abdomen and pelvis was performed using the standard protocol following bolus administration of intravenous contrast. CONTRAST:  151mL OMNIPAQUE IOHEXOL 300 MG/ML  SOLN COMPARISON:  CT 04/07/2010 FINDINGS: Lower chest: Stable 4 mm pleural-based nodule in left lower lobe. Otherwise, the lung bases are clear. Hepatobiliary: Common bile duct is mildly dilated measuring up to 8 mm. No clear evidence for an  obstructing stone or lesion. Gallbladder is mildly distended without inflammatory changes. Normal appearance of the liver. Portal venous system is patent. Pancreas: Minimal dilatation of the main pancreatic duct without inflammatory changes. No suspicious pancreatic lesion. Spleen: Normal in size without focal abnormality. Adrenals/Urinary Tract: Normal adrenal glands. Normal appearance both kidneys. Urinary bladder is unremarkable. Stomach/Bowel: Wall thickening involving the sigmoid colon on sequence 3, image 55. Wall thickening has not significantly changed since 2012. No evidence for acute colonic inflammation. Mild wall thickening of the stomach but the stomach is also decompressed. No gross abnormality to the small bowel. Vascular/Lymphatic: Atherosclerotic calcifications in the thoracic and abdominal aorta. Main visceral arteries are patent. No significant lymph node enlargement. Prominent bilateral ovarian veins, left side greater than right. Reproductive: Low-density structure involving the left adnexa has enlarged since 2012. This structure measures 3.6 x 2.9 cm and previously  measured up to 1.6 cm. No gross abnormality to the uterus or right adnexa. Other: Negative for free fluid.  Negative for free air. Musculoskeletal: Chronic disc space narrowing at L5-S1. No suspicious bone lesions. IMPRESSION: 1. No acute abnormality in the abdomen or pelvis. 2. Wall thickening of the sigmoid colon appears chronic and compatible with diverticulosis. No evidence for acute colonic inflammation 3. **An incidental finding of potential clinical significance has been found. Enlargement of a low-density left adnexal structure. This is probably a cystic structure. Recommend non-emergent pelvic ultrasound to confirm that this is a simple cystic structure.** 4. Biliary dilatation without evidence for an obstructing stone, lesion or gallbladder inflammation. 5. Nonspecific gastric wall thickening which may be related to under distention. 6.  Aortic Atherosclerosis (ICD10-I70.0). Electronically Signed   By: Markus Daft M.D.   On: 09/26/2017 16:19   ECHO 9/24 ------------------------------------------------------------------- Study Conclusions  - Left ventricle: The cavity size was normal. There was moderate   concentric hypertrophy. Systolic function was normal. The   estimated ejection fraction was in the range of 60% to 65%. Wall   motion was normal; there were no regional wall motion   abnormalities. The study is not technically sufficient to allow   evaluation of LV diastolic function. Doppler parameters are   consistent with high ventricular filling pressure. - Aortic valve: Moderately calcified annulus. Trileaflet; mildly   thickened, moderately calcified leaflets. There was moderate   regurgitation. Regurgitation pressure half-time: 389 ms. - Mitral valve: Calcified annulus. Diffuse thickening of the   anterior leaflet, consistent with myxomatous proliferation.   Moderate, holosystolicprolapse, involving the anterior leaflet   and the posterior leaflet. There was mild to moderate    regurgitation directed eccentrically and toward the free wall. - Right atrium: The atrium was severely dilated. - Tricuspid valve: There was mild-moderate regurgitation. - Pulmonic valve: There was trivial regurgitation. - Pulmonary arteries: PA peak pressure: 39 mm Hg (S).   Discharge Exam: Vitals:   09/28/17 1151 09/28/17 1322  BP: 110/60 (!) 108/56  Pulse: 67 (!) 53  Resp:  (!) 22  Temp:  97.7 F (36.5 C)  SpO2:  98%   Vitals:   09/28/17 0626 09/28/17 0817 09/28/17 1151 09/28/17 1322  BP: (!) 102/51  110/60 (!) 108/56  Pulse: 69 65 67 (!) 53  Resp: (!) 25   (!) 22  Temp:  98 F (36.7 C)  97.7 F (36.5 C)  TempSrc:  Oral  Oral  SpO2:  92%  98%  Weight: 49.9 kg     Height:        General: Pt is alert, awake, not  in acute distress Cardiovascular: RRR, S1/S2 +, no rubs, no gallops Respiratory: CTA bilaterally, no wheezing, no rhonchi Abdominal: Soft Extremities: no edema    The results of significant diagnostics from this hospitalization (including imaging, microbiology, ancillary and laboratory) are listed below for reference.     Microbiology: No results found for this or any previous visit (from the past 240 hour(s)).   Labs: BNP (last 3 results) No results for input(s): BNP in the last 8760 hours. Basic Metabolic Panel: Recent Labs  Lab 09/26/17 1255 09/27/17 0829 09/28/17 0413  NA 139 139 142  K 4.6 3.7 4.1  CL 102 105 110  CO2 25 22 25   GLUCOSE 110* 96 98  BUN 22 13 16   CREATININE 1.05* 0.85 0.99  CALCIUM 9.5 8.8* 8.3*  MG  --  1.9  --    Liver Function Tests: Recent Labs  Lab 09/26/17 1255 09/28/17 0413  AST 35 29  ALT 17 15  ALKPHOS 85 64  BILITOT 1.3* 1.1  PROT 6.9 5.2*  ALBUMIN 3.9 2.8*   Recent Labs  Lab 09/26/17 1255  LIPASE 29   No results for input(s): AMMONIA in the last 168 hours. CBC: Recent Labs  Lab 09/26/17 1255 09/27/17 0829 09/28/17 0413  WBC 9.9 9.4 6.0  HGB 14.3 14.5 12.6  HCT 46.1* 45.4 39.6  MCV 98.1  97.6 98.3  PLT 192 207 162   Cardiac Enzymes: Recent Labs  Lab 09/26/17 2030 09/27/17 0423 09/27/17 0829  TROPONINI 0.16* 0.14* 0.12*   BNP: Invalid input(s): POCBNP CBG: No results for input(s): GLUCAP in the last 168 hours. D-Dimer No results for input(s): DDIMER in the last 72 hours. Hgb A1c No results for input(s): HGBA1C in the last 72 hours. Lipid Profile Recent Labs    09/27/17 0423  CHOL 221*  HDL 56  LDLCALC 152*  TRIG 66  CHOLHDL 3.9   Thyroid function studies Recent Labs    09/27/17 0423  TSH 6.839*   Anemia work up No results for input(s): VITAMINB12, FOLATE, FERRITIN, TIBC, IRON, RETICCTPCT in the last 72 hours. Urinalysis    Component Value Date/Time   COLORURINE STRAW (A) 09/26/2017 [place] 09/26/2017 1545   LABSPEC 1.016 09/26/2017 1545   PHURINE 8.0 09/26/2017 1545   GLUCOSEU NEGATIVE 09/26/2017 1545   HGBUR NEGATIVE 09/26/2017 1545   BILIRUBINUR NEGATIVE 09/26/2017 1545   KETONESUR NEGATIVE 09/26/2017 1545   PROTEINUR NEGATIVE 09/26/2017 1545   UROBILINOGEN 0.2 08/22/2012 0056   NITRITE NEGATIVE 09/26/2017 1545   LEUKOCYTESUR NEGATIVE 09/26/2017 1545   Sepsis Labs Invalid input(s): PROCALCITONIN,  WBC,  LACTICIDVEN Microbiology No results found for this or any previous visit (from the past 240 hour(s)).  Time coordinating discharge: 32 minutes  SIGNED:  Chipper Oman, MD  [facility] 09/28/2017, 3:16 PM  Pager please text page via  [email or web address]  Note - This record has been created using [employer]. Chart creation errors have been sought, but may not always have been located. Such creation errors do not reflect on the standard of medical care.

## 2017-09-29 NOTE — Consult Note (Signed)
            [facility] CM Primary Care Navigator  09/29/2017  Lisa Lang 1927/03/30 [id number]   Went to seepatient at the bedside to identify possible discharge needs butshe wasalready dischargedhomeper staff report.  Per MD note,patient was sent by her primary care provider after being evaluated for abdominal pain and found to be in atrial fibrillation with rapid ventricular response. She was found with left ovarian cyst felt to be causing abdominal pain. Cardiology was consulted.  Primary care provider's office is listed as providing transition of care (TOC) follow-up.   Patient has discharge instruction to follow-up withprimary care provider in 1 week and cardiology follow-up (office will arrange appointment).    For additional questions please contact:  Edwena Felty A. Nashid Pellum, BSN, RN-BC [facility] PRIMARY CARE Navigator Cell: [phone]

## 2017-10-06 DIAGNOSIS — I480 Paroxysmal atrial fibrillation: Secondary | ICD-10-CM | POA: Diagnosis not present

## 2017-10-06 DIAGNOSIS — Z23 Encounter for immunization: Secondary | ICD-10-CM | POA: Diagnosis not present

## 2017-10-10 ENCOUNTER — Other Ambulatory Visit: Payer: Self-pay | Admitting: Internal Medicine

## 2017-10-10 DIAGNOSIS — N83202 Unspecified ovarian cyst, left side: Secondary | ICD-10-CM

## 2017-10-14 ENCOUNTER — Ambulatory Visit
Admission: RE | Admit: 2017-10-14 | Discharge: 2017-10-14 | Disposition: A | Discharge status: Home / Self Care | Payer: Medicare Other | Source: Ambulatory Visit | Attending: Internal Medicine | Admitting: Internal Medicine

## 2017-10-14 DIAGNOSIS — N83202 Unspecified ovarian cyst, left side: Secondary | ICD-10-CM

## 2017-10-14 DIAGNOSIS — N838 Other noninflammatory disorders of ovary, fallopian tube and broad ligament: Secondary | ICD-10-CM | POA: Diagnosis not present

## 2017-10-29 ENCOUNTER — Telehealth: Payer: Self-pay | Admitting: Internal Medicine

## 2017-10-29 ENCOUNTER — Other Ambulatory Visit: Payer: Self-pay | Admitting: Cardiology

## 2017-10-29 MED ORDER — APIXABAN 2.5 MG PO TABS: 2.5000 mg | ORAL_TABLET | Freq: Two times a day (BID) | ORAL | 0 refills | Status: DC

## 2017-10-29 NOTE — Telephone Encounter (Signed)
Cardiology Moonlighter Note  Received page from patient.  Patient stating that she ran out of her metoprolol and apixaban yesterday.  Has not taken any doses since that time.  These were both new medications prescribed to her after her recent hospitalization in September 2019.  She is requesting refill on these.  She does not have outpatient follow-up until the first week of November.  The pharmacy that the patient is requesting to pick up medications from is currently closed.  The patient understands this and states that she does not want to go to a 24-hour pharmacy because she does not drive after dark.  It appears as though her apixaban was prescribed electronically by 1 of her outpatient providers today.  I contacted her local [facility] and called in a 30-day prescriptions for both metoprolol tartrate and apixaban at her current doses.  The patient confirmed that she will pick up these medications tomorrow morning.  Marcie Mowers, MD Cardiology Fellow, PGY-6

## 2017-11-04 DIAGNOSIS — I480 Paroxysmal atrial fibrillation: Secondary | ICD-10-CM

## 2017-11-04 HISTORY — DX: Paroxysmal atrial fibrillation: I48.0

## 2017-11-06 DIAGNOSIS — R05 Cough: Secondary | ICD-10-CM | POA: Diagnosis not present

## 2017-11-09 ENCOUNTER — Ambulatory Visit (INDEPENDENT_AMBULATORY_CARE_PROVIDER_SITE_OTHER): Payer: Medicare Other | Admitting: Cardiology

## 2017-11-09 ENCOUNTER — Encounter: Payer: Self-pay | Admitting: Cardiology

## 2017-11-09 VITALS — BP 112/60 | HR 68 | Ht 63.0 in | Wt 102.6 lb

## 2017-11-09 DIAGNOSIS — E785 Hyperlipidemia, unspecified: Secondary | ICD-10-CM | POA: Insufficient documentation

## 2017-11-09 DIAGNOSIS — R5383 Other fatigue: Secondary | ICD-10-CM

## 2017-11-09 DIAGNOSIS — I480 Paroxysmal atrial fibrillation: Secondary | ICD-10-CM

## 2017-11-09 DIAGNOSIS — I341 Nonrheumatic mitral (valve) prolapse: Secondary | ICD-10-CM | POA: Insufficient documentation

## 2017-11-09 MED ORDER — APIXABAN 2.5 MG PO TABS: 2.5000 mg | ORAL_TABLET | Freq: Two times a day (BID) | ORAL | 3 refills | Status: DC

## 2017-11-09 MED ORDER — METOPROLOL TARTRATE 25 MG PO TABS: ORAL_TABLET | ORAL | 3 refills | Status: DC

## 2017-11-09 NOTE — Patient Instructions (Signed)
Medication Instructions:   decrease morning metoprolol tartrate to 12.5 mg , the evening dose to 25 mg. May take an extra 12.5 mg in th emorning if needed for palpations   If you need a refill on your cardiac medications before your next appointment, please call your pharmacy.   Lab work: Not needed If you have labs (blood work) drawn today and your tests are completely normal, you will receive your results only by: [name] Kitchen MyChart Message (if you have MyChart) OR . A paper copy in the mail If you have any lab test that is abnormal or we need to change your treatment, we will call you to review the results.  Testing/Procedures: Not needed  Follow-Up: At [facility], you and your health needs are our priority.  As part of our continuing mission to provide you with exceptional heart care, we have created designated Provider Care Teams.  These Care Teams include your primary Cardiologist (physician) and Advanced Practice Providers (APPs -  Physician Assistants and Nurse Practitioners) who all work together to provide you with the care you need, when you need it. You will need a follow up appointment in 6 months.  Please call our office 2 months in advance to schedule this appointment.  You may see Glenetta Hew, MD or one of the following Advanced Practice Providers on your designated Care Team:   Rosaria Ferries, PA-C . Jory Sims, DNP, ANP  Any Other Special Instructions Will Be Listed Below (If Applicable).

## 2017-11-09 NOTE — Progress Notes (Signed)
PCP: Lavone Orn, MD  Clinic Note: Chief Complaint  Patient presents with  . Hospitalization Follow-up    Feels tired in the day  . Atrial Fibrillation    No breakthrough symptoms.    HPI: Lisa Lang is a 84 y.o. female with a PMH HLD, MVP, OA, diverticulitis, palpitations w/ PVCs on ECG in the setting of dehydration below who presents today for hospital f/u with new Dx of Afib-RVR.  MAKENZEY NANNI was last seen in consultation on 9/23-25 hospital admission.  Recent Hospitalizations:   9/23-25/2019: -> ER for abdominal pain (? Ovarian Cyst on CT) --> also noted to be in Afib RVR (140s).  Spontaneous Conversion to NSR.  Mildly elevated Troponin levels (0.16).Rudie Meyer VASc score > 3 (Age >85 and female sex), --> Eliquis & Metoprolol   Studies Personally Reviewed - (if available, images/films reviewed: From Epic Chart or Care Everywhere)  ECHO 03/2017: Normal LVET (60-65%).  Gr 1 DD. Mild AI.  Mild MR with bileaflet MVP.  Mild pulm HTN.  ECHO 09/27/2017: Normal LVEF w/o RWMA. Diastolic dysfxn with elevated LVEDP.  AoV sclerosis with Mod AI.  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  Severe RA dilation.    Interval History: Lisa Lang presents here today overall doing fairly well.  She is been doing fine since her discharge.  Not really noticing complaints at all.  Has not had any further recurrence of rapid irregular heartbeats palpitations.  What she notes is being a little bit tired and fatigued during the day.  No chest pain or pressure with rest or exertion.  No PND, orthopnea or edema.  No palpitations, lightheadedness, dizziness, weakness or syncope/near syncope. No TIA/amaurosis fugax symptoms. No melena, hematochezia, hematuria, or epstaxis. No claudication.  ROS: A comprehensive was performed. Review of Systems  Constitutional: Positive for malaise/fatigue (Feels tired during the day). Negative for chills and fever.  HENT: Negative for congestion  and nosebleeds.   Respiratory: Negative for cough, shortness of breath and wheezing.   Gastrointestinal: Negative for blood in stool and melena.  Genitourinary: Negative for hematuria.  Musculoskeletal: Negative for back pain and falls.  Neurological: Negative for dizziness, focal weakness, weakness and headaches.  Endo/Heme/Allergies: Does not bruise/bleed easily.  Psychiatric/Behavioral: Negative for memory loss. The patient is not nervous/anxious and does not have insomnia.   All other systems reviewed and are negative.  I have reviewed and (if needed) personally updated the patient's problem list, medications, allergies, past medical and surgical history, social and family history.   Past Medical History:  Diagnosis Date  . Arthritis   . Diverticulitis 1995   And 2007  . Diverticulosis   . Hyperlipidemia LDL goal <100   . Mitral valve prolapse    Bileaflet MVP with mild-mod MR.    Past Surgical History:  Procedure Laterality Date  . CATARACT EXTRACTION, BILATERAL    . TRANSTHORACIC ECHOCARDIOGRAM  3/'19, 9/'19   03/2017: Normal LVET (60-65%).  Gr 1 DD. Mild AI.  Mild MR with bileaflet MVP.  Mild pulm HTN; ECHO 09/27/2017: Normal LVEF w/o RWMA. Diastolic dysfxn with elevated LVEDP.  AoV sclerosis with Mod AI.  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  Severe RA dilation.      Current Meds  Medication Sig  . alendronate (FOSAMAX) 70 MG tablet Take 1 tablet by mouth once a week.  [name] Kitchen apixaban (ELIQUIS) 2.5 MG TABS tablet Take 1 tablet (2.5 mg total) by mouth 2 (  two) times daily.  [name] Kitchen atorvastatin (LIPITOR) 40 MG tablet Take 40 mg by mouth at bedtime.  . benzonatate (TESSALON) 200 MG capsule Take 200 mg by mouth 3 (three) times daily.  . calcium carbonate (OS-CAL - DOSED IN MG OF ELEMENTAL CALCIUM) 1250 MG tablet Take 1 tablet by mouth daily with breakfast.  . metoprolol tartrate (LOPRESSOR) 25 MG tablet Take 12.5 mg in the morning , and 25 mg in the evening, may take  an additional 12.5 mg tablet if needed daily for palpations  . Multiple Vitamins-Minerals (MULTIVITAMIN PO) Take 1 tablet by mouth daily.  . Vitamin D, Ergocalciferol, 2000 units CAPS Take 2,000 Units by mouth daily. Twice a month   . [DISCONTINUED] apixaban (ELIQUIS) 2.5 MG TABS tablet Take 1 tablet (2.5 mg total) by mouth 2 (two) times daily.  . [DISCONTINUED] metoprolol tartrate (LOPRESSOR) 25 MG tablet Take 1 tablet (25 mg total) by mouth 2 (two) times daily.    Allergies  Allergen Reactions  . Amoxicillin   . Ciprofloxacin   . Lidocaine   . Sulfa Antibiotics     Social History   Tobacco Use  . Smoking status: Never Smoker  . Smokeless tobacco: Never Used  Substance Use Topics  . Alcohol use: No  . Drug use: No   Social History   Social History Narrative   Pt lives w/ husband, she is his caregiver, he has dementia.    family history includes CAD in her father; Cervical cancer in her mother; Lung cancer in her sister.  Wt Readings from Last 3 Encounters:  11/09/17 102 lb 9.6 oz (46.5 kg)  09/28/17 109 lb 14.4 oz (49.9 kg)  05/18/16 105 lb (47.6 kg)    PHYSICAL EXAM BP 112/60   Pulse 68   Ht 5\' 3"  (1.6 m)   Wt 102 lb 9.6 oz (46.5 kg)   BMI 18.17 kg/m  Physical Exam  Constitutional: She is oriented to person, place, and time. She appears well-developed and well-nourished. No distress.  Relatively healthy-appearing for age.  HENT:  Head: Normocephalic and atraumatic.  Neck: Normal range of motion. Neck supple. No JVD present.  Cardiovascular: Normal rate, regular rhythm, intact distal pulses and normal pulses.  Occasional extrasystoles are present. PMI is not displaced. Exam reveals an opening snap. Exam reveals no gallop and no friction rub.  Murmur heard.  Medium-pitched blowing holosystolic murmur is present at the lower left sternal border and apex. Pulmonary/Chest: Effort normal and breath sounds normal. No respiratory distress. She has no wheezes. She has  no rales.  Abdominal: Soft. Bowel sounds are normal. She exhibits no distension. There is no tenderness. There is no rebound.  Musculoskeletal: Normal range of motion. She exhibits no edema.  Neurological: She is alert and oriented to person, place, and time.  Psychiatric: She has a normal mood and affect. Her behavior is normal. Judgment and thought content normal.  Vitals reviewed.    Adult ECG Report  Rate: 68 ;  Rhythm: normal sinus rhythm and premature atrial contractions (PAC); rightward axis of 92 degrees.  Cannot rule out anteroseptal MI, age undetermined.  Incomplete RBBB.  Narrative Interpretation: Rhythm is now sinus with PACs as opposed to A. fib.   Other studies Reviewed: H additional studies/ records that were reviewed today include:  Recent Labs:   Lab Results  Component Value Date   CREATININE 0.99 09/28/2017   BUN 16 09/28/2017   NA 142 09/28/2017   K 4.1 09/28/2017   CL 110 09/28/2017  CO2 25 09/28/2017    Lab Results  Component Value Date   WBC 6.0 09/28/2017   HGB 12.6 09/28/2017   HCT 39.6 09/28/2017   MCV 98.3 09/28/2017   PLT 162 09/28/2017     ASSESSMENT / PLAN: Problem List Items Addressed This Visit    Fatigue due to treatment    Notes fatigue secondary to being on beta-blocker.  Will reduce morning dose to see if this will allow more heart rate responsiveness.      MVP (mitral valve prolapse) (Chronic)    Relatively asymptomatic from her MVP.  Consider rechecking echocardiogram next year just to reassess, but if stable would only address if symptoms warrant.      Relevant Medications   metoprolol tartrate (LOPRESSOR) 25 MG tablet   apixaban (ELIQUIS) 2.5 MG TABS tablet   Other Relevant Orders   EKG 12-Lead (Completed)   PAF (paroxysmal atrial fibrillation) (HCC) - Primary (Chronic)    Thankfully, no breakthrough episodes.  I am little bit concerned with her fatigue so we will back off on her morning dose of metoprolol.  This patients  CHA2DS2-VASc Score and unadjusted Ischemic Stroke Rate (% per year) is equal to 4.8 % stroke rate/year from a score of 4  Above score calculated as 1 point each if present [CHF, HTN, DM, Vascular=MI/PAD/Aortic Plaque, Age if 65-74, or Female] Above score calculated as 2 points each if present [Age > 75, or Stroke/TIA/TE]  --On rate based Eliquis dose.       Relevant Medications   metoprolol tartrate (LOPRESSOR) 25 MG tablet   apixaban (ELIQUIS) 2.5 MG TABS tablet   Other Relevant Orders   EKG 12-Lead (Completed)      I spent a total of 25 minutes with the patient and chart review. >  50% of the time was spent in direct patient consultation.   Current medicines are reviewed at length with the patient today.  (+/- concerns) a little tired during the day  The following changes have been made:  reduce AM dose of BB.   Patient Instructions  Medication Instructions:   decrease morning metoprolol tartrate to 12.5 mg , the evening dose to 25 mg. May take an extra 12.5 mg in th emorning if needed for palpations   If you need a refill on your cardiac medications before your next appointment, please call your pharmacy.   Lab work: Not needed If you have labs (blood work) drawn today and your tests are completely normal, you will receive your results only by: [name] Kitchen MyChart Message (if you have MyChart) OR . A paper copy in the mail If you have any lab test that is abnormal or we need to change your treatment, we will call you to review the results.  Testing/Procedures: Not needed  Follow-Up: At [facility], you and your health needs are our priority.  As part of our continuing mission to provide you with exceptional heart care, we have created designated Provider Care Teams.  These Care Teams include your primary Cardiologist (physician) and Advanced Practice Providers (APPs -  Physician Assistants and Nurse Practitioners) who all work together to provide you with the care you need, when  you need it. You will need a follow up appointment in 6 months.  Please call our office 2 months in advance to schedule this appointment.  You may see Glenetta Hew, MD or one of the following Advanced Practice Providers on your designated Care Team:   Rosaria Ferries, PA-C . Jory Sims,  DNP, ANP  Any Other Special Instructions Will Be Listed Below (If Applicable).    Studies Ordered:   Orders Placed This Encounter  Procedures  . EKG 12-Lead      Glenetta Hew, M.D., M.S. Interventional Cardiologist   Pager # [phone] Phone # [phone] [address] [place], [place] [address]   Thank you for choosing Heartcare at [facility]!!

## 2017-11-14 ENCOUNTER — Encounter: Payer: Self-pay | Admitting: Cardiology

## 2017-11-14 DIAGNOSIS — R5383 Other fatigue: Secondary | ICD-10-CM | POA: Insufficient documentation

## 2017-11-14 NOTE — Assessment & Plan Note (Signed)
Thankfully, no breakthrough episodes.  I am little bit concerned with her fatigue so we will back off on her morning dose of metoprolol.  This patients CHA2DS2-VASc Score and unadjusted Ischemic Stroke Rate (% per year) is equal to 4.8 % stroke rate/year from a score of 4  Above score calculated as 1 point each if present [CHF, HTN, DM, Vascular=MI/PAD/Aortic Plaque, Age if 65-74, or Female] Above score calculated as 2 points each if present [Age > 75, or Stroke/TIA/TE]  --On rate based Eliquis dose.

## 2017-11-14 NOTE — Assessment & Plan Note (Signed)
Relatively asymptomatic from her MVP.  Consider rechecking echocardiogram next year just to reassess, but if stable would only address if symptoms warrant.

## 2017-11-14 NOTE — Assessment & Plan Note (Signed)
Notes fatigue secondary to being on beta-blocker.  Will reduce morning dose to see if this will allow more heart rate responsiveness.

## 2018-02-21 DIAGNOSIS — R609 Edema, unspecified: Secondary | ICD-10-CM | POA: Diagnosis not present

## 2018-03-06 ENCOUNTER — Other Ambulatory Visit: Payer: Self-pay | Admitting: Internal Medicine

## 2018-03-06 DIAGNOSIS — N839 Noninflammatory disorder of ovary, fallopian tube and broad ligament, unspecified: Secondary | ICD-10-CM | POA: Diagnosis not present

## 2018-03-06 DIAGNOSIS — E7800 Pure hypercholesterolemia, unspecified: Secondary | ICD-10-CM | POA: Diagnosis not present

## 2018-03-06 DIAGNOSIS — Z1389 Encounter for screening for other disorder: Secondary | ICD-10-CM | POA: Diagnosis not present

## 2018-03-06 DIAGNOSIS — Z0000 Encounter for general adult medical examination without abnormal findings: Secondary | ICD-10-CM | POA: Diagnosis not present

## 2018-03-06 DIAGNOSIS — M810 Age-related osteoporosis without current pathological fracture: Secondary | ICD-10-CM

## 2018-03-06 DIAGNOSIS — R609 Edema, unspecified: Secondary | ICD-10-CM | POA: Diagnosis not present

## 2018-03-06 DIAGNOSIS — I480 Paroxysmal atrial fibrillation: Secondary | ICD-10-CM | POA: Diagnosis not present

## 2018-03-13 ENCOUNTER — Encounter: Payer: Self-pay | Admitting: Cardiology

## 2018-03-13 ENCOUNTER — Ambulatory Visit (INDEPENDENT_AMBULATORY_CARE_PROVIDER_SITE_OTHER): Payer: Medicare Other | Admitting: Cardiology

## 2018-03-13 VITALS — BP 137/71 | HR 65 | Ht 63.0 in | Wt 113.2 lb

## 2018-03-13 DIAGNOSIS — I341 Nonrheumatic mitral (valve) prolapse: Secondary | ICD-10-CM

## 2018-03-13 DIAGNOSIS — R5383 Other fatigue: Secondary | ICD-10-CM | POA: Diagnosis not present

## 2018-03-13 DIAGNOSIS — E785 Hyperlipidemia, unspecified: Secondary | ICD-10-CM

## 2018-03-13 DIAGNOSIS — I480 Paroxysmal atrial fibrillation: Secondary | ICD-10-CM

## 2018-03-13 NOTE — Progress Notes (Signed)
PCP: Lavone Orn, MD  Clinic Note: Chief Complaint  Patient presents with  . Follow-up    No complaints  . Atrial Fibrillation    Nothing prolonged short spells    HPI: Lisa Lang is a 85 y.o. female with a recent Dx of PAF (along with a PMH of HLD, MVP, OA, diverticulitis, palpitations w/ PVCs on ECG) for 3 month f/u.   Lisa Lang was initially seen in consultation on 9/23-25 hospital admission. -->  Lisa Lang abdominal pain at that time with ovarian cyst.  No to be in A. fib RVR spontaneously converted to sinus rhythm.  Mildly elevated Troponin levels (0.16).Rudie Meyer VASc score > 3 (Age >24 and female sex), --> Eliquis & Metoprolol   I last saw Lisa Lang on November 09, 2017--Lisa Lang was doing very well with no issues since discharge.  No recurrent episodes of A. fib.  No bleeding issues on Lisa Lang DOAC.  I had Lisa Lang reduce Lisa Lang morning dose of beta-blocker to help avoid dizziness and fatigue.  Recent Hospitalizations:   None  Studies Personally Reviewed - (if available, images/films reviewed: From Epic Chart or Care Everywhere)  None  Interval History: Lisa Lang presents here today feeling great.  Lisa Lang actually states that shortly after I saw Lisa Lang, Lisa Lang husband finally died after prolonged course of worsening dementia and failure to thrive.  He fell, hit his head and ended up with his subarachnoid hemorrhage that he did not recover from.  Interestingly, Lisa Lang feels much more relieved after his death and that Lisa Lang now no longer has the responsibility of being the primary caregiver and can start living back for herself.  Lisa Lang misses Lisa Lang husband, but he has been a far cry from Lisa Lang husband for several years and I think Lisa Lang is handling the loss very well. Lisa Lang said that Lisa Lang is no longer really tired in the day.  Lisa Lang still takes a nap every now and then, but has been feeling better overall.  Lisa Lang says that every now and then Lisa Lang has had a couple spells of tachycardia.  One that Lisa Lang can mentions where Lisa Lang  had some palpitations neck be feeling for couple minutes but nothing like half an hour or anything longer.  Since then Lisa Lang is not really had any tachycardia spells. Lisa Lang is otherwise asymptomatic from a cardiac standpoint.  No chest pain or pressure with rest or exertion.  No PND, orthopnea or edema.  No syncope or near syncope, but does occasionally noticed orthostatic dizziness.  No claudication.  No bleeding issues on low-dose Eliquis.   ROS: A comprehensive was performed. Review of Systems  Constitutional: Negative for chills, fever, malaise/fatigue (Feels tired during the day) and weight loss (finally putting on some weight).  HENT: Negative for congestion and nosebleeds.   Respiratory: Negative for cough, shortness of breath and wheezing.   Gastrointestinal: Negative for blood in stool and melena.  Genitourinary: Negative for hematuria.  Musculoskeletal: Negative for back pain and falls.  Neurological: Negative for dizziness, focal weakness, weakness and headaches.  Endo/Heme/Allergies: Does not bruise/bleed easily.  Psychiatric/Behavioral: Negative for memory loss. The patient is not nervous/anxious and does not have insomnia.   All other systems reviewed and are negative.  I have reviewed and (if needed) personally updated the patient's problem list, medications, allergies, past medical and surgical history, social and family history.   Past Medical History:  Diagnosis Date  . Arthritis   . Diverticulitis 1995   And 2007  . Diverticulosis   .  Hyperlipidemia LDL goal <100   . Mitral valve prolapse    Bileaflet MVP with mild-mod MR.  [name] Kitchen PAF (paroxysmal atrial fibrillation) ([place]) 11/2017   CHA2DS2 VASc score > 3 (Age >2 and female sex), --> Eliquis & Metoprolol   Tingling  Past Surgical History:  Procedure Laterality Date  . CATARACT EXTRACTION, BILATERAL    . TRANSTHORACIC ECHOCARDIOGRAM  3/'19, 9/'19   03/2017: Normal LVET (60-65%).  Gr 1 DD. Mild AI.  Mild MR with bileaflet  MVP.  Mild pulm HTN; ECHO 09/27/2017: Normal LVEF w/o RWMA. Diastolic dysfxn with elevated LVEDP.  AoV sclerosis with Mod AI.  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  Severe RA dilation.      Current Meds  Medication Sig  . alendronate (FOSAMAX) 70 MG tablet Take 1 tablet by mouth once a week.  [name] Kitchen apixaban (ELIQUIS) 2.5 MG TABS tablet Take 1 tablet (2.5 mg total) by mouth 2 (two) times daily.  [name] Kitchen atorvastatin (LIPITOR) 40 MG tablet Take 40 mg by mouth at bedtime.  . calcium carbonate (OS-CAL - DOSED IN MG OF ELEMENTAL CALCIUM) 1250 MG tablet Take 1 tablet by mouth daily with breakfast.  . metoprolol tartrate (LOPRESSOR) 25 MG tablet Take 12.5 mg in the morning , and 25 mg in the evening, may take an additional 12.5 mg tablet if needed daily for palpations  . Multiple Vitamins-Minerals (MULTIVITAMIN PO) Take 1 tablet by mouth daily.  . Vitamin D, Ergocalciferol, 2000 units CAPS Take 2,000 Units by mouth daily. Twice a month     Allergies  Allergen Reactions  . Amoxicillin   . Ciprofloxacin   . Lidocaine   . Sulfa Antibiotics     Social History   Tobacco Use  . Smoking status: Never Smoker  . Smokeless tobacco: Never Used  Substance Use Topics  . Alcohol use: No  . Drug use: No   Social History   Social History Narrative   Recently widowed November 2019--Lisa Lang had been the primary caregiver for Lisa Lang husband who had progressively worsening dementia.   Now lives alone and is planning to move into a retirement community potentially.    family history includes CAD in Lisa Lang father; Cervical cancer in Lisa Lang mother; Lung cancer in Lisa Lang sister.  Wt Readings from Last 3 Encounters:  03/13/18 113 lb 3.2 oz (51.3 kg)  11/09/17 102 lb 9.6 oz (46.5 kg)  09/28/17 109 lb 14.4 oz (49.9 kg)    PHYSICAL EXAM BP 137/71   Pulse 65   Ht 5\' 3"  (1.6 m)   Wt 113 lb 3.2 oz (51.3 kg)   SpO2 99%   BMI 20.05 kg/m  Physical Exam  Constitutional: Lisa Lang is oriented to person, place, and  time. Lisa Lang appears well-developed and well-nourished. No distress.  Relatively healthy-appearing for age.  HENT:  Head: Normocephalic and atraumatic.  Neck: Normal range of motion. Neck supple. No JVD present.  Cardiovascular: Normal rate, regular rhythm, S1 normal, intact distal pulses and normal pulses. Frequent extrasystoles are present. PMI is not displaced. Exam reveals an opening snap. Exam reveals no gallop and no friction rub.  Murmur heard.  Harsh crescendo-decrescendo early systolic murmur is present at the upper right sternal border radiating to the neck. High-pitched blowing holosystolic murmur is also present at the apex. Widely split S2  Pulmonary/Chest: Effort normal and breath sounds normal. No respiratory distress. Lisa Lang has no wheezes. Lisa Lang has no rales.  Abdominal: Soft. Bowel sounds are normal. Lisa Lang exhibits no distension. There is  no abdominal tenderness. There is no rebound.  No HJR.  Musculoskeletal: Normal range of motion.        General: No edema.  Neurological: Lisa Lang is alert and oriented to person, place, and time.  Psychiatric: Lisa Lang has a normal mood and affect. Lisa Lang behavior is normal. Judgment and thought content normal.  Vitals reviewed.    Adult ECG Report Not checked   Other studies Reviewed: H additional studies/ records that were reviewed today include:  Recent Labs:   Lab Results  Component Value Date   CREATININE 0.99 09/28/2017   BUN 16 09/28/2017   NA 142 09/28/2017   K 4.1 09/28/2017   CL 110 09/28/2017   CO2 25 09/28/2017    Lab Results  Component Value Date   WBC 6.0 09/28/2017   HGB 12.6 09/28/2017   HCT 39.6 09/28/2017   MCV 98.3 09/28/2017   PLT 162 09/28/2017    ASSESSMENT / PLAN: Problem List Items Addressed This Visit    Fatigue due to treatment    Better with reduced dose of metoprolol in the morning.      Hyperlipidemia LDL goal <100    Lisa Lang is on atorvastatin as of Lisa Lang most recent labs did not seem to be working very well.   If next set of labs looks the same, I would just simply stop the statin tolerating Lisa Lang hyperlipidemia.      MVP (mitral valve prolapse) (Chronic)    Echo did show moderate mitral prolapse but did not comment much by mitral regurgitation.  Probably reevaluate around this time next year to see if there is any worsening, but otherwise would not recheck..  Could be related to Lisa Lang palpitations.      PAF (paroxysmal atrial fibrillation) - CHA2DS2Vasc = 4. Low dose Eliquis - Primary (Chronic)    Maybe a couple shorter spells, does not sound like A. fib.  Remains on low-dose metoprolol with 12.5 mg in the morning and 25 mg.  Lisa Lang is not having to use any as needed doses.           I spent a total of 25 minutes with the patient and chart review. >  50% of the time was spent in direct patient consultation.   Current medicines are reviewed at length with the patient today.  (+/- concerns) a little tired during the day  The following changes have been made:  reduce AM dose of BB.   Patient Instructions  Medication Instructions:  NO CHANGES EXCEPT -- IF YOU HAVE IN EXTRA HEARTBEAT  MAY TAKE AN ADDITIONAL METOPROLOL - IF DOES NT  SUBSIDE CALL OFFICE FOR MORE INSTRUCTIONS If you need a refill on your cardiac medications before your next appointment, please call your pharmacy.   Lab work: Not needed If you have labs (blood work) drawn today and your tests are completely normal, you will receive your results only by: [name] Kitchen MyChart Message (if you have MyChart) OR . A paper copy in the mail If you have any lab test that is abnormal or we need to change your treatment, we will call you to review the results.  Testing/Procedures: Not needed  Follow-Up: At [facility], you and your health needs are our priority.  As part of our continuing mission to provide you with exceptional heart care, we have created designated Provider Care Teams.  These Care Teams include your primary Cardiologist (physician)  and Advanced Practice Providers (APPs -  Physician Assistants and Nurse Practitioners) who all work together  to provide you with the care you need, when you need it. You will need a follow up appointment in 6 months SEPT 2021.  Please call our office 2 months in advance to schedule this appointment.  You may see Glenetta Hew, MD or one of the following Advanced Practice Providers on your designated Care Team:   Rosaria Ferries, PA-C . Jory Sims, DNP, ANP  Any Other Special Instructions Will Be Listed Below (If Applicable).   Studies Ordered:   No orders of the defined types were placed in this encounter.     Glenetta Hew, M.D., M.S. Interventional Cardiologist   Pager # [phone] Phone # [phone] [address] [place], [place] [address]   Thank you for choosing Heartcare at [facility]!!

## 2018-03-13 NOTE — Patient Instructions (Signed)
Medication Instructions:  NO CHANGES EXCEPT -- IF YOU HAVE IN EXTRA HEARTBEAT  MAY TAKE AN ADDITIONAL METOPROLOL - IF DOES NT  SUBSIDE CALL OFFICE FOR MORE INSTRUCTIONS If you need a refill on your cardiac medications before your next appointment, please call your pharmacy.   Lab work: Not needed If you have labs (blood work) drawn today and your tests are completely normal, you will receive your results only by: [name] Kitchen MyChart Message (if you have MyChart) OR . A paper copy in the mail If you have any lab test that is abnormal or we need to change your treatment, we will call you to review the results.  Testing/Procedures: Not needed  Follow-Up: At [facility], you and your health needs are our priority.  As part of our continuing mission to provide you with exceptional heart care, we have created designated Provider Care Teams.  These Care Teams include your primary Cardiologist (physician) and Advanced Practice Providers (APPs -  Physician Assistants and Nurse Practitioners) who all work together to provide you with the care you need, when you need it. You will need a follow up appointment in 6 months SEPT 2021.  Please call our office 2 months in advance to schedule this appointment.  You may see Glenetta Hew, MD or one of the following Advanced Practice Providers on your designated Care Team:   Rosaria Ferries, PA-C . Jory Sims, DNP, ANP  Any Other Special Instructions Will Be Listed Below (If Applicable).

## 2018-03-15 ENCOUNTER — Encounter: Payer: Self-pay | Admitting: Cardiology

## 2018-03-15 NOTE — Assessment & Plan Note (Signed)
She is on atorvastatin as of her most recent labs did not seem to be working very well.  If next set of labs looks the same, I would just simply stop the statin tolerating her hyperlipidemia.

## 2018-03-15 NOTE — Assessment & Plan Note (Signed)
Echo did show moderate mitral prolapse but did not comment much by mitral regurgitation.  Probably reevaluate around this time next year to see if there is any worsening, but otherwise would not recheck..  Could be related to her palpitations.

## 2018-03-15 NOTE — Assessment & Plan Note (Signed)
Better with reduced dose of metoprolol in the morning.

## 2018-03-15 NOTE — Assessment & Plan Note (Signed)
Maybe a couple shorter spells, does not sound like A. fib.  Remains on low-dose metoprolol with 12.5 mg in the morning and 25 mg.  She is not having to use any as needed doses.

## 2018-04-03 ENCOUNTER — Other Ambulatory Visit: Payer: Medicare Other

## 2018-05-02 DIAGNOSIS — L820 Inflamed seborrheic keratosis: Secondary | ICD-10-CM | POA: Diagnosis not present

## 2018-06-19 DIAGNOSIS — H26493 Other secondary cataract, bilateral: Secondary | ICD-10-CM | POA: Diagnosis not present

## 2018-06-19 DIAGNOSIS — H5201 Hypermetropia, right eye: Secondary | ICD-10-CM | POA: Diagnosis not present

## 2018-06-19 DIAGNOSIS — H5212 Myopia, left eye: Secondary | ICD-10-CM | POA: Diagnosis not present

## 2018-08-23 ENCOUNTER — Telehealth: Payer: Self-pay | Admitting: *Deleted

## 2018-08-23 NOTE — Telephone Encounter (Signed)
Left message for patient to call and reschedule 10/02/18 10:40 am appointment with Dr. Ellyn Hack (Dr. Ellyn Hack is out of the office)

## 2018-10-02 ENCOUNTER — Ambulatory Visit: Payer: Medicare Other | Admitting: Cardiology

## 2018-10-17 ENCOUNTER — Other Ambulatory Visit: Payer: Self-pay | Admitting: Cardiology

## 2018-10-19 ENCOUNTER — Ambulatory Visit (INDEPENDENT_AMBULATORY_CARE_PROVIDER_SITE_OTHER): Payer: Medicare Other | Admitting: Cardiology

## 2018-10-19 ENCOUNTER — Other Ambulatory Visit: Payer: Self-pay

## 2018-10-19 ENCOUNTER — Encounter: Payer: Self-pay | Admitting: Cardiology

## 2018-10-19 VITALS — BP 120/68 | HR 60 | Ht 63.0 in | Wt 116.0 lb

## 2018-10-19 DIAGNOSIS — I341 Nonrheumatic mitral (valve) prolapse: Secondary | ICD-10-CM | POA: Diagnosis not present

## 2018-10-19 DIAGNOSIS — I480 Paroxysmal atrial fibrillation: Secondary | ICD-10-CM | POA: Diagnosis not present

## 2018-10-19 DIAGNOSIS — R5383 Other fatigue: Secondary | ICD-10-CM | POA: Diagnosis not present

## 2018-10-19 NOTE — Progress Notes (Signed)
PCP: Lavone Orn, MD  Clinic Note: Chief Complaint  Patient presents with  . Follow-up    Doing well  . Atrial Fibrillation    No recurrence     HPI:   Lisa Lang is a 85 y.o. female with a relatively recent diagnosis of A. fib (along with hyperlipidemia, & " MVP") who presents today for delayed 25-month follow-up for PAF.   Lisa Lang was initially seen in consultation on 9/23-25 hospital admission. -->    She was admitted for significant abdominal pain at that time with ovarian cyst.  No to be in A. fib RVR spontaneously converted to sinus rhythm.  Mildly elevated Troponin levels (0.16).Lisa Lang VASc score>3(Age >16 and female sex), --> Eliquis & Metoprolol   I last saw her on November 09, 2017--she was doing very well with no issues since discharge.  No recurrent episodes of A. fib.  No bleeding issues on her DOAC.  I had her reduce her morning dose of beta-blocker to help avoid dizziness and fatigue.  Lisa Lang was last seen on in March 2020 just prior to COVID-19 shutdown.  She indicated that her husband had just died after prolonged episode of dementia.  Apparently suffered a fall with subarachnoid hemorrhage and did not recover.  She actually seems relieved that he is now no longer suffering with dementia and that she does not have the burden of being his caregiver --indicating that he has been a far cry from his normal self.  Recent Hospitalizations: none  Reviewed  CV studies:   The following studies were reviewed today: (if available, images/films reviewed: From Epic Chart or Care Everywhere) . none:  Interval History:   Lisa Lang returns today overall doing quite well.  She seems to be very stable and happy in good spirits.  Her only complaint is having some seasonal allergies with some congestion and coughing. She has not had any symptoms to suggest any recurrence of atrial fibrillation.  She is more active has more energy now without the burden  of being the caregiver for her husband.  She seems to be somewhat liberated.  She does pretty well getting out and about doing her shopping online having them delivered.  She does go for walks and does gardening etc.  Cardiovascular review of symptoms: no chest pain or dyspnea on exertion positive for - Mild end of day swelling negative for - irregular heartbeat, orthopnea, palpitations, paroxysmal nocturnal dyspnea, rapid heart rate, shortness of breath or TIA/amaurosis fugax, syncope/near syncope, claudication  The patient does not have symptoms concerning for COVID-19 infection (fever, chills, cough, or new shortness of breath).  The patient is practicing social distancing. ++ Masking. Has groceries delivered.   REVIEWED OF SYSTEMS   ROS: A comprehensive was performed. Review of Systems  Constitutional: Negative for malaise/fatigue and weight loss.  HENT: Positive for congestion (From allergies). Negative for nosebleeds.   Respiratory: Positive for cough (Related to postnasal drip, allergies). Negative for shortness of breath.   Gastrointestinal: Negative for blood in stool, heartburn, melena, nausea and vomiting.  Genitourinary: Negative for hematuria.  Musculoskeletal: Negative for falls and joint pain.  Neurological: Negative for dizziness.  Endo/Heme/Allergies: Positive for environmental allergies.  Psychiatric/Behavioral: Negative.    I have reviewed and (if needed) personally updated the patient's problem list, medications, allergies, past medical and surgical history, social and family history.   PAST MEDICAL HISTORY  Past Medical History:  Diagnosis Date  . Arthritis   . Diverticulitis 1995   And 2007  . Diverticulosis   . Hyperlipidemia LDL goal <100   . Mitral valve prolapse    Bileaflet MVP with mild-mod MR.  [name] Kitchen PAF (paroxysmal atrial fibrillation) ([place]) 11/2017   CHA2DS2 VASc score > 3 (Age >20 and female sex), --> Eliquis & Metoprolol      PAST SURGICAL  HISTORY   Past Surgical History:  Procedure Laterality Date  . CATARACT EXTRACTION, BILATERAL    . TRANSTHORACIC ECHOCARDIOGRAM  3/'19, 9/'19   03/2017: Normal LVET (60-65%).  Gr 1 DD. Mild AI.  Mild MR with bileaflet MVP.  Mild pulm HTN; ECHO 09/27/2017: Normal LVEF w/o RWMA. Diastolic dysfxn with elevated LVEDP.  AoV sclerosis with Mod AI.  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  Severe RA dilation.       MEDICATIONS/ALLERGIES   Current Meds  Medication Sig  . alendronate (FOSAMAX) 70 MG tablet Take 1 tablet by mouth once a week.  [name] Kitchen atorvastatin (LIPITOR) 40 MG tablet Take 40 mg by mouth at bedtime.  . calcium carbonate (OS-CAL - DOSED IN MG OF ELEMENTAL CALCIUM) 1250 MG tablet Take 1 tablet by mouth daily with breakfast.  . ELIQUIS 2.5 MG TABS tablet TAKE 1 TABLET TWICE A DAY  . metoprolol tartrate (LOPRESSOR) 25 MG tablet TAKE AS INSTRUCTED BY YOUR PRESCRIBER  . Multiple Vitamins-Minerals (MULTIVITAMIN PO) Take 1 tablet by mouth daily.  . Vitamin D, Ergocalciferol, 2000 units CAPS Take 2,000 Units by mouth daily. Twice a month     Allergies  Allergen Reactions  . Amoxicillin   . Ciprofloxacin   . Lidocaine   . Sulfa Antibiotics      SOCIAL HISTORY/FAMILY HISTORY   Social History   Tobacco Use  . Smoking status: Never Smoker  . Smokeless tobacco: Never Used  Substance Use Topics  . Alcohol use: No  . Drug use: No   Social History   Social History Narrative   Recently widowed November 2019--she had been the primary caregiver for her husband who had progressively worsening dementia.   Now lives alone and is planning to move into a retirement community potentially.    family history includes CAD in her father; Cervical cancer in her mother; Lung cancer in her sister.   OBJCTIVE -PE, EKG, labs   Wt Readings from Last 3 Encounters:  10/19/18 116 lb (52.6 kg)  03/13/18 113 lb 3.2 oz (51.3 kg)  11/09/17 102 lb 9.6 oz (46.5 kg)    Physical Exam:  BP 120/68   Pulse 60   Ht 5\' 3"  (1.6 m)   Wt 116 lb (52.6 kg)   SpO2 97%   BMI 20.55 kg/m  Physical Exam  Constitutional: She is oriented to person, place, and time. She appears well-developed and well-nourished. No distress.  HENT:  Head: Normocephalic and atraumatic.  Neck: Normal range of motion. Neck supple. No hepatojugular reflux and no JVD present. Carotid bruit is not present.  Cardiovascular: Normal rate, normal heart sounds and intact distal pulses.  No extrasystoles are present. PMI is not displaced. Exam reveals no gallop and no friction rub.  No murmur heard. Pulmonary/Chest: Effort normal and breath sounds normal. No respiratory distress. She has no wheezes. She has no rales.  Musculoskeletal: Normal range of motion.        General: No edema.  Neurological: She is alert and oriented to person, place, and time.  Psychiatric:  She has a normal mood and affect. Her behavior is normal. Judgment and thought content normal.  Vitals reviewed.   Adult ECG Report none  Recent Labs:  03/06/2018: TC 256, HDL of 58, LDL 172, TG 128.  CR 0.94  ASSESSMENT/PLAN    Problem List Items Addressed This Visit    PAF (paroxysmal atrial fibrillation) - CHA2DS2Vasc = 4. Low dose Eliquis - Primary (Chronic)    Probably triggered episode that occurred while she was sick and she has not had any further recurrence. Remains on Eliquis at low-dose with no complications. Remains on low-dose beta-blocker.  Continue to monitor for recurrence.  No signs of bradycardia.      Relevant Orders   EKG 12-Lead (Completed)   MVP (mitral valve prolapse) (Chronic)    Moderate moderate growth valve prolapse but no significant regurgitation noted on the most recent echo.  In the absence of any significant murmur.  We will simply monitor.      Fatigue due to treatment    No longer an issue since we reduced her beta-blocker dose.  I think a lot of it had to do with the fact that she is now getting more  rest after her husband passed.          COVID-19 Education: The signs and symptoms of COVID-19 were discussed with the patient and how to seek care for testing (follow up with PCP or arrange E-visit).   The importance of social distancing was discussed today.  I spent a total of 57minutes with the patient and chart review. >  50% of the time was spent in direct patient consultation.  Additional time spent with chart review (studies, outside notes, etc): 3 Total Time: 18 min   Current medicines are reviewed at length with the patient today.  (+/- concerns) none   Patient Instructions / Medication Changes & Studies & Tests Ordered   Patient Instructions  Medication Instructions:  No changes   Lab Work:  Not needed  Testing/Procedures:   not needed Follow-Up: At [facility], you and your health needs are our priority.  As part of our continuing mission to provide you with exceptional heart care, we have created designated Provider Care Teams.  These Care Teams include your primary Cardiologist (physician) and Advanced Practice Providers (APPs -  Physician Assistants and Nurse Practitioners) who all work together to provide you with the care you need, when you need it.  Your next appointment:   12 months  The format for your next appointment:   In Person  Provider:   Glenetta Hew, MD  Other Instructions     Studies Ordered:   Orders Placed This Encounter  Procedures  . EKG 12-Lead     Glenetta Hew, M.D., M.S. Interventional Cardiologist   Pager # [phone] Phone # [phone] [address] [place], [place] [address]   Thank you for choosing [facility]!!

## 2018-10-19 NOTE — Patient Instructions (Addendum)
Medication Instructions:  No changes   Lab Work:  Not needed  Testing/Procedures:   not needed Follow-Up: At [facility], you and your health needs are our priority.  As part of our continuing mission to provide you with exceptional heart care, we have created designated Provider Care Teams.  These Care Teams include your primary Cardiologist (physician) and Advanced Practice Providers (APPs -  Physician Assistants and Nurse Practitioners) who all work together to provide you with the care you need, when you need it.  Your next appointment:   12 months  The format for your next appointment:   In Person  Provider:   Glenetta Hew, MD  Other Instructions

## 2018-10-21 ENCOUNTER — Encounter: Payer: Self-pay | Admitting: Cardiology

## 2018-10-21 NOTE — Assessment & Plan Note (Signed)
Probably triggered episode that occurred while she was sick and she has not had any further recurrence. Remains on Eliquis at low-dose with no complications. Remains on low-dose beta-blocker.  Continue to monitor for recurrence.  No signs of bradycardia.

## 2018-10-21 NOTE — Assessment & Plan Note (Signed)
No longer an issue since we reduced her beta-blocker dose.  I think a lot of it had to do with the fact that she is now getting more rest after her husband passed.

## 2018-10-21 NOTE — Assessment & Plan Note (Signed)
Moderate moderate growth valve prolapse but no significant regurgitation noted on the most recent echo.  In the absence of any significant murmur.  We will simply monitor.

## 2018-11-09 ENCOUNTER — Emergency Department (HOSPITAL_COMMUNITY): Payer: Medicare Other

## 2018-11-09 ENCOUNTER — Other Ambulatory Visit: Payer: Self-pay

## 2018-11-09 ENCOUNTER — Emergency Department (HOSPITAL_COMMUNITY)
Admission: EM | Admit: 2018-11-09 | Discharge: 2018-11-09 | Disposition: A | Discharge status: Home / Self Care | Payer: Medicare Other | Attending: Emergency Medicine | Admitting: Emergency Medicine

## 2018-11-09 ENCOUNTER — Encounter (HOSPITAL_COMMUNITY): Payer: Self-pay | Admitting: Emergency Medicine

## 2018-11-09 DIAGNOSIS — W19XXXA Unspecified fall, initial encounter: Secondary | ICD-10-CM | POA: Diagnosis not present

## 2018-11-09 DIAGNOSIS — Y9301 Activity, walking, marching and hiking: Secondary | ICD-10-CM | POA: Insufficient documentation

## 2018-11-09 DIAGNOSIS — S60512A Abrasion of left hand, initial encounter: Secondary | ICD-10-CM | POA: Diagnosis not present

## 2018-11-09 DIAGNOSIS — Z79899 Other long term (current) drug therapy: Secondary | ICD-10-CM | POA: Diagnosis not present

## 2018-11-09 DIAGNOSIS — W010XXA Fall on same level from slipping, tripping and stumbling without subsequent striking against object, initial encounter: Secondary | ICD-10-CM | POA: Diagnosis not present

## 2018-11-09 DIAGNOSIS — R220 Localized swelling, mass and lump, head: Secondary | ICD-10-CM | POA: Diagnosis not present

## 2018-11-09 DIAGNOSIS — S01112A Laceration without foreign body of left eyelid and periocular area, initial encounter: Secondary | ICD-10-CM

## 2018-11-09 DIAGNOSIS — Z7901 Long term (current) use of anticoagulants: Secondary | ICD-10-CM | POA: Diagnosis not present

## 2018-11-09 DIAGNOSIS — I959 Hypotension, unspecified: Secondary | ICD-10-CM | POA: Diagnosis not present

## 2018-11-09 DIAGNOSIS — Y999 Unspecified external cause status: Secondary | ICD-10-CM | POA: Diagnosis not present

## 2018-11-09 DIAGNOSIS — S80212A Abrasion, left knee, initial encounter: Secondary | ICD-10-CM | POA: Insufficient documentation

## 2018-11-09 DIAGNOSIS — Y9289 Other specified places as the place of occurrence of the external cause: Secondary | ICD-10-CM | POA: Insufficient documentation

## 2018-11-09 DIAGNOSIS — I6782 Cerebral ischemia: Secondary | ICD-10-CM | POA: Diagnosis not present

## 2018-11-09 DIAGNOSIS — S0993XA Unspecified injury of face, initial encounter: Secondary | ICD-10-CM | POA: Diagnosis present

## 2018-11-09 DIAGNOSIS — S80211A Abrasion, right knee, initial encounter: Secondary | ICD-10-CM | POA: Diagnosis not present

## 2018-11-09 DIAGNOSIS — S60511A Abrasion of right hand, initial encounter: Secondary | ICD-10-CM | POA: Diagnosis not present

## 2018-11-09 DIAGNOSIS — S51012A Laceration without foreign body of left elbow, initial encounter: Secondary | ICD-10-CM | POA: Diagnosis not present

## 2018-11-09 DIAGNOSIS — T07XXXA Unspecified multiple injuries, initial encounter: Secondary | ICD-10-CM

## 2018-11-09 MED ORDER — BACITRACIN ZINC 500 UNIT/GM EX OINT
TOPICAL_OINTMENT | Freq: Two times a day (BID) | CUTANEOUS | Status: DC
  Filled 2018-11-09: qty 1.8

## 2018-11-09 MED ORDER — LIDOCAINE-EPINEPHRINE (PF) 2 %-1:200000 IJ SOLN
10.0000 mL | Freq: Once | INTRAMUSCULAR | Status: AC
  Administered 2018-11-09: 10 mL
  Filled 2018-11-09: qty 10

## 2018-11-09 NOTE — ED Provider Notes (Signed)
  Face-to-face evaluation   History: She is here for evaluation of injury, she tripped on an uneven sidewalk, falling forward, injuring her left hand and forehead.  She denies loss of consciousness, neck or back pain.  Physical exam: Alert elderly female, calm and cooperative.  Moves arms and legs easily.  No significant limitation of movement of the left hand or wrist.  There are abrasions on the palm of the left hand.  There is a gaping laceration of her left eyebrow.  She is lucid.  Medical screening examination/treatment/procedure(s) were conducted as a shared visit with non-physician practitioner(s) and myself.  I personally evaluated the patient during the encounter    Daleen Bo, MD 11/13/18 [phone]

## 2018-11-09 NOTE — ED Notes (Signed)
Wound irrigated.

## 2018-11-09 NOTE — Discharge Instructions (Addendum)
You were seen in the ER for fall   CT of head does not show signs of trauma or injuries  Laceration was repaired with 4 sutures  You reported tetanus was up to date. Your sutures need to come out within 5-7 days.   The original dressing should be left in place for 24 hours.  If your laceration is small enough, you can remove original dressing after 24 hours after which laceration can be opened to air. Laceration can then be gently cleaned with mild soap and water after 24 hours of laceration repair to prevent crusting over the suture knots. An antibiotic ointment can be applied to the wound as well, twice daily until suture removal. If your laceration is large or can be contaminated you can cover it with a dressing throughout the day.   You may shower or wash the wound with soap and water. Avoid prolonged soaking of stitches including swimming in chlorinated water, pools, hot tubs. Do not swim or soak in natural bodies of water because of a potential increased risk of infection.   Return for swelling, pain, redness, pus, fevers.

## 2018-11-09 NOTE — ED Provider Notes (Signed)
[facility] DEPT Provider Note   CSN: [id number] Arrival date & time: 11/09/18  1506     History   Chief Complaint Chief Complaint  Patient presents with  . Fall    HPI Lisa Lang is a 85 y.o. female brought to the ER by EMS for evaluation of mechanical fall that occurred immediately PTA.  Patient was on her usual walk when she tripped over the side of a curb and fell forward landing on her palms and knees.  She bumped the left side of her face/eyebrow and has a laceration there.  She was able to stand up on her own and has been ambulatory since.  She is on Eliquis for previous history of atrial fibrillation.  Denies any other physical injuries from the fall.  Denies any significant headache, vision changes, neck pain, chest pain, abdominal pain or back pain, other extremity injury.  No interventions.  No alleviating factors.     HPI  Past Medical History:  Diagnosis Date  . Arthritis   . Diverticulitis 1995   And 2007  . Diverticulosis   . Hyperlipidemia LDL goal <100   . Mitral valve prolapse    Bileaflet MVP with mild-mod MR.  [name] Kitchen PAF (paroxysmal atrial fibrillation) ([place]) 11/2017   CHA2DS2 VASc score > 3 (Age >62 and female sex), --> Eliquis & Metoprolol     Patient Active Problem List   Diagnosis Date Noted  . Fatigue due to treatment 11/14/2017  . Hyperlipidemia LDL goal <100 11/09/2017  . MVP (mitral valve prolapse) 11/09/2017  . PAF (paroxysmal atrial fibrillation) - CHA2DS2Vasc = 4. Low dose Eliquis 09/27/2017  . Ovarian cyst 09/26/2017    Past Surgical History:  Procedure Laterality Date  . CATARACT EXTRACTION, BILATERAL    . TRANSTHORACIC ECHOCARDIOGRAM  3/'19, 9/'19   03/2017: Normal LVET (60-65%).  Gr 1 DD. Mild AI.  Mild MR with bileaflet MVP.  Mild pulm HTN; ECHO 09/27/2017: Normal LVEF w/o RWMA. Diastolic dysfxn with elevated LVEDP.  AoV sclerosis with Mod AI.  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  Severe  RA dilation.       OB History   No obstetric history on file.      Home Medications    Prior to Admission medications   Medication Sig Start Date End Date Taking? Authorizing Provider  alendronate (FOSAMAX) 70 MG tablet Take 1 tablet by mouth once a week. 09/10/17   [provider]  atorvastatin (LIPITOR) 40 MG tablet Take 40 mg by mouth at bedtime. 09/05/17   [provider]  calcium carbonate (OS-CAL - DOSED IN MG OF ELEMENTAL CALCIUM) 1250 MG tablet Take 1 tablet by mouth daily with breakfast.    [provider]  ELIQUIS 2.5 MG TABS tablet TAKE 1 TABLET TWICE A DAY 10/17/18   Leonie Man, MD  metoprolol tartrate (LOPRESSOR) 25 MG tablet TAKE AS INSTRUCTED BY YOUR PRESCRIBER 10/17/18   Leonie Man, MD  Multiple Vitamins-Minerals (MULTIVITAMIN PO) Take 1 tablet by mouth daily.    [provider]  Vitamin D, Ergocalciferol, 2000 units CAPS Take 2,000 Units by mouth daily. Twice a month     [provider]    Family History Family History  Problem Relation Age of Onset  . Cervical cancer Mother   . CAD Father   . Lung cancer Sister   . Stroke Neg Hx   . Diabetes Neg Hx     Social History Social  History   Tobacco Use  . Smoking status: Never Smoker  . Smokeless tobacco: Never Used  Substance Use Topics  . Alcohol use: No  . Drug use: No     Allergies   Amoxicillin, Ciprofloxacin, Lidocaine, and Sulfa antibiotics   Review of Systems Review of Systems  Skin: Positive for wound.  Hematological: Bruises/bleeds easily.  All other systems reviewed and are negative.    Physical Exam Updated Vital Signs BP (!) 145/57   Pulse 69   Temp 98.2 F (36.8 C) (Oral)   Resp 17   SpO2 97%   Physical Exam Vitals signs and nursing note reviewed.  Constitutional:      General: She is not in acute distress.    Appearance: She is well-developed.     Comments: NAD.  HENT:     Head: Normocephalic. Laceration present.      Comments: 2 cm laceration immediately below the left eyebrow, locally tender with surrounding bruising but no significant edema.  No surrounding tenderness, crepitus.    Right Ear: External ear normal.     Left Ear: External ear normal.     Nose: Nose normal.     Comments: No nasal bone tenderness.  Midface is stable and nontender.    Mouth/Throat:     Comments: No intraoral or tongue injury or bleed.  Dentition is stable, lower dentures noted. Eyes:     General: No scleral icterus.    Conjunctiva/sclera: Conjunctivae normal.  Neck:     Musculoskeletal: Normal range of motion and neck supple.     Comments: No midline or paraspinal muscle tenderness.  Forward motion of the neck without pain. Cardiovascular:     Rate and Rhythm: Normal rate and regular rhythm.     Heart sounds: Normal heart sounds.  Pulmonary:     Effort: Pulmonary effort is normal.     Breath sounds: Normal breath sounds.  Musculoskeletal: Normal range of motion.        General: Signs of injury present. No deformity.     Comments: TL spine: No midline or paraspinal muscle tenderness. Abrasion noted in thenar/hypothenar prominences bilaterally, locally tender.  Full range of motion of wrists without pain.  No scaphoid tenderness.  No distal forearm tenderness bilaterally. Small abrasions bilateral patellas without significant tenderness.  Full range of motion of knees without pain.  No joint laxity.  No focal bony tenderness to knees.  Skin:    General: Skin is warm and dry.     Capillary Refill: Capillary refill takes less than 2 seconds.  Neurological:     Mental Status: She is alert and oriented to person, place, and time.     Comments:  Mental Status: Patient is awake, alert, oriented to person, place, year, and situation. Patient is able to give a clear and coherent history.  Speech is fluent and clear without dysarthria or aphasia.   Cranial Nerves: I not tested. II - XII intact bilaterally   Motor: Strength  5/5 in upper/lower extremities. Sensation to light touch intact in face, upper/lower extremities.  No pronator drift. No leg drop.  Cerebellar: No ataxia with finger to nose. Sits up on bed without truncal sway.  Psychiatric:        Behavior: Behavior normal.        Thought Content: Thought content normal.        Judgment: Judgment normal.      ED Treatments / Results  Labs (all labs ordered are listed, but only abnormal  results are displayed) Labs Reviewed - No data to display  EKG None  Radiology Ct Head Wo Contrast  Result Date: 11/09/2018 CLINICAL DATA:  Left eyebrow laceration EXAM: CT HEAD WITHOUT CONTRAST TECHNIQUE: Contiguous axial images were obtained from the base of the skull through the vertex without intravenous contrast. COMPARISON:  CT brain 05/02/2008 FINDINGS: Brain: No acute territorial infarction, hemorrhage, or intracranial mass. Moderate atrophy. Minimal small vessel ischemic change of the white matter. Stable ventricle size Vascular: No hyperdense vessels.  Carotid vascular calcification Skull: Normal. Negative for fracture or focal lesion. Sinuses/Orbits: Mucosal thickening in the ethmoid sinuses Other: Left periorbital soft tissue swelling IMPRESSION: 1. No CT evidence for acute intracranial abnormality 2. Atrophy and mild small vessel ischemic change of the white matter Electronically Signed   By: Donavan Foil M.D.   On: 11/09/2018 18:16    Procedures .[name] KitchenLaceration Repair  Date/Time: 11/10/2018 12:52 AM Performed by: Kinnie Feil, PA-C Authorized by: Kinnie Feil, PA-C   Consent:    Consent obtained:  Verbal   Consent given by:  Patient   Risks discussed:  Infection, need for additional repair, pain, poor cosmetic result and poor wound healing   Alternatives discussed:  No treatment and delayed treatment Universal protocol:    Procedure explained and questions answered to patient or proxy's satisfaction: yes     Relevant documents present and  verified: yes     Test results available and properly labeled: yes     Imaging studies available: yes     Required blood products, implants, devices, and special equipment available: yes     Site/side marked: yes     Immediately prior to procedure, a time out was called: yes     Patient identity confirmed:  Verbally with patient Anesthesia (see MAR for exact dosages):    Anesthesia method:  Local infiltration   Local anesthetic:  Lidocaine 2% WITH epi Laceration details:    Location:  Face   Face location:  L eyebrow   Length (cm):  2 Pre-procedure details:    Preparation:  Patient was prepped and draped in usual sterile fashion and imaging obtained to evaluate for foreign bodies Exploration:    Hemostasis achieved with:  Epinephrine and direct pressure   Wound exploration: wound explored through full range of motion and entire depth of wound probed and visualized     Wound extent: no foreign bodies/material noted, no muscle damage noted, no nerve damage noted, no underlying fracture noted and no vascular damage noted     Contaminated: no   Treatment:    Area cleansed with:  Betadine   Amount of cleaning:  Standard   Irrigation solution:  Sterile saline   Irrigation method:  Tap   Visualized foreign bodies/material removed: no   Skin repair:    Repair method:  Sutures   Suture size:  4-0   Wound skin closure material used: ethilon.   Suture technique:  Simple interrupted   Number of sutures:  4 Approximation:    Approximation:  Close Post-procedure details:    Dressing:  Antibiotic ointment and non-adherent dressing   Patient tolerance of procedure:  Tolerated well, no immediate complications   (including critical care time)  Medications Ordered in ED Medications  lidocaine-EPINEPHrine (XYLOCAINE W/EPI) 2 %-1:200000 (PF) injection 10 mL (10 mLs Infiltration Given by Other 11/09/18 1602)     Initial Impression / Assessment and Plan / ED Course  I have reviewed the triage  vital signs and the nursing  notes.  Pertinent labs & imaging results that were available during my care of the patient were reviewed by me and considered in my medical decision making (see chart for details).  85 yo F here after mechanical fall.   Exam reveals left eyebrow laceration with surrounding contusion, otherwise exam benign not revealing other signs to suggest significant CTL spine, extremity injury.   CT obtained negative for traumatic injuries.   Laceration repaired in ER without immediate complications. Full sensation and movement of affected face before laceration repair. Full movement of affected face after procedure.   Appropriate for dc with tylenol, ice, wound care instructions and PCP f/u for suture removal.  Return precautions discussed. Patient in agreement, ambulatory in ER.   Final Clinical Impressions(s) / ED Diagnoses   Final diagnoses:  Fall, initial encounter  Laceration of left eyebrow, initial encounter  Multiple abrasions    ED Discharge Orders    None       Kinnie Feil, PA-C 11/10/18 2300    Daleen Bo, MD 11/13/18 [id number]

## 2018-11-09 NOTE — ED Triage Notes (Signed)
Per EMS, patient from home, reports trip and fall outside, laceration above left eye. Denies LOC. Takes Eliquis. Denies pain.

## 2018-11-09 NOTE — ED Notes (Signed)
Pt was verbalized discharge instructions. Pt had no further questions at this time. NAD,

## 2018-11-10 DIAGNOSIS — S60222A Contusion of left hand, initial encounter: Secondary | ICD-10-CM | POA: Diagnosis not present

## 2018-11-10 DIAGNOSIS — S60519A Abrasion of unspecified hand, initial encounter: Secondary | ICD-10-CM | POA: Diagnosis not present

## 2018-11-10 DIAGNOSIS — S0512XA Contusion of eyeball and orbital tissues, left eye, initial encounter: Secondary | ICD-10-CM | POA: Diagnosis not present

## 2018-11-10 DIAGNOSIS — S0100XA Unspecified open wound of scalp, initial encounter: Secondary | ICD-10-CM | POA: Diagnosis not present

## 2019-01-25 ENCOUNTER — Ambulatory Visit: Payer: Medicare Other | Attending: Internal Medicine

## 2019-01-25 DIAGNOSIS — Z23 Encounter for immunization: Secondary | ICD-10-CM | POA: Insufficient documentation

## 2019-01-25 NOTE — Progress Notes (Signed)
   Covid-19 Vaccination Clinic  Name:  Lisa Lang    MRN: [id number] DOB: April 05, 1927  01/25/2019  [name] was observed post Covid-19 immunization for 15 minutes without incidence. She was provided with Vaccine Information Sheet and instruction to access the V-Safe system.   [name] was instructed to call 911 with any severe reactions post vaccine: [name] Kitchen Difficulty breathing  . Swelling of your face and throat  . A fast heartbeat  . A bad rash all over your body  . Dizziness and weakness    Immunizations Administered    Name Date Dose VIS Date Route   Pfizer COVID-19 Vaccine 01/25/2019  3:30 PM 0.3 mL 12/15/2018 Intramuscular   Manufacturer: [place]   Lot: [id number]   [place]: [id number]

## 2019-02-15 ENCOUNTER — Ambulatory Visit: Payer: Medicare Other | Attending: Internal Medicine

## 2019-02-15 DIAGNOSIS — Z23 Encounter for immunization: Secondary | ICD-10-CM | POA: Insufficient documentation

## 2019-02-15 NOTE — Progress Notes (Signed)
   Covid-19 Vaccination Clinic  Name:  Lisa Lang    MRN: [id number] DOB: 01/29/1927  02/15/2019  [name] was observed post Covid-19 immunization for 15 minutes without incidence. She was provided with Vaccine Information Sheet and instruction to access the V-Safe system.   [name] was instructed to call 911 with any severe reactions post vaccine: [name] Kitchen Difficulty breathing  . Swelling of your face and throat  . A fast heartbeat  . A bad rash all over your body  . Dizziness and weakness    Immunizations Administered    Name Date Dose VIS Date Route   Pfizer COVID-19 Vaccine 02/15/2019  4:43 PM 0.3 mL 12/15/2018 Intramuscular   Manufacturer: [place]   Lot: [id number]   [place]: [id number]

## 2019-03-08 DIAGNOSIS — M5441 Lumbago with sciatica, right side: Secondary | ICD-10-CM | POA: Diagnosis not present

## 2019-03-08 DIAGNOSIS — E7800 Pure hypercholesterolemia, unspecified: Secondary | ICD-10-CM | POA: Diagnosis not present

## 2019-03-08 DIAGNOSIS — M810 Age-related osteoporosis without current pathological fracture: Secondary | ICD-10-CM | POA: Diagnosis not present

## 2019-03-08 DIAGNOSIS — Z1389 Encounter for screening for other disorder: Secondary | ICD-10-CM | POA: Diagnosis not present

## 2019-03-08 DIAGNOSIS — Z5181 Encounter for therapeutic drug level monitoring: Secondary | ICD-10-CM | POA: Diagnosis not present

## 2019-03-08 DIAGNOSIS — Z0000 Encounter for general adult medical examination without abnormal findings: Secondary | ICD-10-CM | POA: Diagnosis not present

## 2019-03-08 DIAGNOSIS — I480 Paroxysmal atrial fibrillation: Secondary | ICD-10-CM | POA: Diagnosis not present

## 2019-05-01 DIAGNOSIS — C44319 Basal cell carcinoma of skin of other parts of face: Secondary | ICD-10-CM | POA: Diagnosis not present

## 2019-05-01 DIAGNOSIS — I781 Nevus, non-neoplastic: Secondary | ICD-10-CM | POA: Diagnosis not present

## 2019-05-01 DIAGNOSIS — L821 Other seborrheic keratosis: Secondary | ICD-10-CM | POA: Diagnosis not present

## 2019-06-22 DIAGNOSIS — H26493 Other secondary cataract, bilateral: Secondary | ICD-10-CM | POA: Diagnosis not present

## 2019-06-22 DIAGNOSIS — H524 Presbyopia: Secondary | ICD-10-CM | POA: Diagnosis not present

## 2019-08-21 DIAGNOSIS — Z85828 Personal history of other malignant neoplasm of skin: Secondary | ICD-10-CM | POA: Diagnosis not present

## 2019-08-21 DIAGNOSIS — Z08 Encounter for follow-up examination after completed treatment for malignant neoplasm: Secondary | ICD-10-CM | POA: Diagnosis not present

## 2019-08-21 DIAGNOSIS — L821 Other seborrheic keratosis: Secondary | ICD-10-CM | POA: Diagnosis not present

## 2019-08-21 DIAGNOSIS — D225 Melanocytic nevi of trunk: Secondary | ICD-10-CM | POA: Diagnosis not present

## 2019-08-21 DIAGNOSIS — C4441 Basal cell carcinoma of skin of scalp and neck: Secondary | ICD-10-CM | POA: Diagnosis not present

## 2019-10-02 DIAGNOSIS — Z08 Encounter for follow-up examination after completed treatment for malignant neoplasm: Secondary | ICD-10-CM | POA: Diagnosis not present

## 2019-10-02 DIAGNOSIS — Z85828 Personal history of other malignant neoplasm of skin: Secondary | ICD-10-CM | POA: Diagnosis not present

## 2019-10-17 ENCOUNTER — Ambulatory Visit: Payer: Medicare Other | Admitting: Cardiology

## 2019-11-06 ENCOUNTER — Ambulatory Visit: Payer: Medicare Other | Admitting: Cardiology

## 2019-11-08 ENCOUNTER — Other Ambulatory Visit: Payer: Self-pay

## 2019-11-08 ENCOUNTER — Encounter: Payer: Self-pay | Admitting: Cardiology

## 2019-11-08 ENCOUNTER — Ambulatory Visit (INDEPENDENT_AMBULATORY_CARE_PROVIDER_SITE_OTHER): Payer: Medicare Other | Admitting: Cardiology

## 2019-11-08 VITALS — BP 130/61 | HR 60 | Temp 97.5°F | Ht 61.0 in | Wt 109.0 lb

## 2019-11-08 DIAGNOSIS — I341 Nonrheumatic mitral (valve) prolapse: Secondary | ICD-10-CM | POA: Diagnosis not present

## 2019-11-08 DIAGNOSIS — I480 Paroxysmal atrial fibrillation: Secondary | ICD-10-CM

## 2019-11-08 DIAGNOSIS — R5383 Other fatigue: Secondary | ICD-10-CM

## 2019-11-08 MED ORDER — APIXABAN 2.5 MG PO TABS: 2.5000 mg | ORAL_TABLET | Freq: Two times a day (BID) | ORAL | 3 refills | Status: DC

## 2019-11-08 MED ORDER — METOPROLOL TARTRATE 25 MG PO TABS: ORAL_TABLET | ORAL | 3 refills | Status: DC

## 2019-11-08 NOTE — Progress Notes (Signed)
Primary Care Provider: Lavone Orn, MD Cardiologist: Glenetta Hew, MD Electrophysiologist: None  Clinic Note: Chief Complaint  Patient presents with  . Follow-up    Annual  . Atrial Fibrillation    Has not had any symptoms.    HPI:    Lisa Lang is a 86 y.o. female with PAROXYSMAL ATRIAL FIBRILLATION (and previous evaluation for hyperlipidemia and questionable mitral prolapse) below who presents today for annual follow-up.   Her initial consult was in September 2019 admitted with abdominal pain secondary to ovarian cyst.  Abdomen A. fib RVR and spontaneously converted to sinus rhythm.  CHA2DS2 VASc score>3(Age >75 and female sex), --> Eliquis & Metoprolol25 mg twice daily  Problem List Items Addressed This Visit    PAF (paroxysmal atrial fibrillation) - CHA2DS2Vasc = 4. Low dose Eliquis - Primary (Chronic)   Relevant Medications   metoprolol tartrate (LOPRESSOR) 25 MG tablet   apixaban (ELIQUIS) 2.5 MG TABS tablet   Other Relevant Orders   EKG 12-Lead (Completed)     Lisa Lang was last seen in October 2020-she was in great spirits.  Doing well.  Only bothered by seasonal use with congestion coughing.  No sensation of recurrence of A. fib.  Felt somewhat liberated after her husband passed away.  No longer having such responsibilities as his caregiver.  She was sad, but psychologically felt better.  Very active, but does not like heat.  Only mild end of day edema.  Recent Hospitalizations: None  Reviewed  CV studies:    The following studies were reviewed today: (if available, images/films reviewed: From Epic Chart or Care Everywhere) . None:   Interval History:   Lisa Lang returns here today for annual follow-up again doing great.  She is smiling as usual.  She says that every now and then she loses her footing or her balance.  She says she feels somewhat clumsy.  This is not related to loss of balance or any vertigo or dizziness.  She just has a hard  time sometimes figuring out what affects her feet.  She is active, and likes to go outside and doing yard work and gardening.  She just does not like going out in the heat.  She enjoys walking, but only when it is cool.  No sense of recurrence of A. fib.  I do not think she really would know what it feels like baseline her lack of knowledge that she was in A. fib while in hospital.  She denies any sudden onset dyspnea or chest discomfort.  No heart failure symptoms.  CV Review of Symptoms (Summary): no chest pain or dyspnea on exertion positive for - Mildly day swelling of the goes down with foot elevation.  Also somewhat unsteady gait negative for - chest pain, dyspnea on exertion, irregular heartbeat, orthopnea, palpitations, paroxysmal nocturnal dyspnea, rapid heart rate, shortness of breath or Lightheadedness or dizziness, syncope/near syncope or TIA/amaurosis fugax, claudication  The patient does not have symptoms concerning for COVID-19 infection (fever, chills, cough, or new shortness of breath).   REVIEWED OF SYSTEMS   Review of Systems  Constitutional: Negative for malaise/fatigue and weight loss.  HENT: Positive for congestion. Negative for nosebleeds.   Respiratory: Positive for cough (And sneezing).   Gastrointestinal: Negative for blood in stool and melena.  Genitourinary: Negative for hematuria.  Musculoskeletal: Negative for falls and joint pain.  Neurological: Negative for dizziness and headaches.  Endo/Heme/Allergies: Positive for environmental allergies (Has congestion and sneezing.).  I have reviewed and (if needed) personally updated the patient's problem list, medications, allergies, past medical and surgical history, social and family history.   PAST MEDICAL HISTORY   Past Medical History:  Diagnosis Date  . Arthritis   . Diverticulitis 1995   And 2007  . Diverticulosis   . Hyperlipidemia LDL goal <100   . Mitral valve prolapse    Bileaflet MVP with moderate  MR.  [name] Kitchen PAF (paroxysmal atrial fibrillation) ([place]) 11/2017   CHA2DS2 VASc score > 3 (Age >70 and female sex), --> Eliquis & Metoprolol     PAST SURGICAL HISTORY   Past Surgical History:  Procedure Laterality Date  . CATARACT EXTRACTION, BILATERAL    . TRANSTHORACIC ECHOCARDIOGRAM  3/'19, 9/'19   03/2017: Normal LVET (60-65%).  Gr 1 DD. Mild AI.  Mild MR with bileaflet MVP.  Mild pulm HTN; ECHO 09/27/2017: Normal LVEF w/o RWMA. Diastolic dysfxn with elevated LVEDP.  AoV sclerosis with Mod AI.  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  Severe RA dilation.     September 2019 echo: Diffuse thickening of anterior leaflet consistent with myxomatous perforation.  Moderate holosystolic prolapse involving anterior echo posterior leaflet.  Moderate mitral vegetation.  Immunization History  Administered Date(s) Administered  . Influenza, High Dose Seasonal PF 10/09/2018  . PFIZER SARS-COV-2 Vaccination 01/25/2019, 02/15/2019    MEDICATIONS/ALLERGIES   Current Meds  Medication Sig  . alendronate (FOSAMAX) 70 MG tablet Take 1 tablet by mouth once a week.  [name] Kitchen apixaban (ELIQUIS) 2.5 MG TABS tablet Take 1 tablet (2.5 mg total) by mouth 2 (two) times daily.  [name] Kitchen atorvastatin (LIPITOR) 40 MG tablet Take 40 mg by mouth at bedtime.  . calcium carbonate (OS-CAL - DOSED IN MG OF ELEMENTAL CALCIUM) 1250 MG tablet Take 1 tablet by mouth daily with breakfast.  . metoprolol tartrate (LOPRESSOR) 25 MG tablet TAKE 1 TABLET TWICE A DAY  . Multiple Vitamins-Minerals (MULTIVITAMIN PO) Take 1 tablet by mouth daily.  . Vitamin D, Ergocalciferol, 2000 units CAPS Take 2,000 Units by mouth daily. Twice a month     Allergies  Allergen Reactions  . Amoxicillin   . Ciprofloxacin   . Lidocaine   . Sulfa Antibiotics     SOCIAL HISTORY/FAMILY HISTORY   Reviewed in Epic:  Pertinent findings: no change - still active as ever  OBJCTIVE -PE, EKG, labs   Wt Readings from Last 3 Encounters:   11/08/19 109 lb (49.4 kg)  10/19/18 116 lb (52.6 kg)  03/13/18 113 lb 3.2 oz (51.3 kg)    Physical Exam: BP 130/61   Pulse 60   Temp (!) 97.5 F (36.4 C)   Ht 5\' 1"  (1.549 m)   Wt 109 lb (49.4 kg)   SpO2 94%   BMI 20.60 kg/m  Physical Exam Vitals reviewed.  Constitutional:      General: She is not in acute distress.    Appearance: Normal appearance. She is normal weight. She is not ill-appearing or toxic-appearing.  HENT:     Head: Normocephalic and atraumatic.  Neck:     Vascular: No carotid bruit or hepatojugular reflux.  Cardiovascular:     Rate and Rhythm: Normal rate and regular rhythm.  No extrasystoles are present.    Chest Wall: PMI is not displaced.     Pulses: Intact distal pulses.     Heart sounds: S1 normal. Heart sounds not distant. Murmur heard.  Medium-pitched blowing holosystolic murmur is present with a grade of  1/6 at the apex radiating to the back.  No gallop.   Pulmonary:     Effort: Pulmonary effort is normal. No respiratory distress.     Breath sounds: Normal breath sounds.  Chest:     Chest wall: No tenderness.  Musculoskeletal:        General: Swelling (Trivial) present. Normal range of motion.     Cervical back: Normal range of motion.  Skin:    General: Skin is warm and dry.  Neurological:     General: No focal deficit present.     Mental Status: She is alert. Mental status is at baseline.     Cranial Nerves: No cranial nerve deficit.     Adult ECG Report  Rate: 60 ;  Rhythm: normal sinus rhythm and Intubated right bundle branch block.  Otherwise normal axis, intervals and durations.;   Narrative Interpretation: Stable EKG  Recent Labs: Due to have labs checked by PCP in January. Lab Results  Component Value Date   CHOL 221 (H) 09/27/2017   HDL 56 09/27/2017   LDLCALC 152 (H) 09/27/2017   TRIG 66 09/27/2017   CHOLHDL 3.9 09/27/2017   Lab Results  Component Value Date   CREATININE 0.99 09/28/2017   BUN 16 09/28/2017   NA 142  09/28/2017   K 4.1 09/28/2017   CL 110 09/28/2017   CO2 25 09/28/2017   Lab Results  Component Value Date   TSH 6.839 (H) 09/27/2017    ASSESSMENT/PLAN    Problem List Items Addressed This Visit    PAF (paroxysmal atrial fibrillation) - CHA2DS2Vasc = 4. Low dose Eliquis - Primary (Chronic)    No recurrent symptoms.  Likely triggered by acute illness. We chose to use Eliquis for anticoagulation because he can use low-dose of there is low bleeding risk.  He does.  She has rheumatic mitral disease, and she and I both acknowledge that this is an off label treatment in this situation, simply was not studied.  She is doing well on low-dose Eliquis.  Rates are relatively well controlled on low-dose beta-blocker.  No changes for now.  No signs of bradycardia or chronotropic incompetence.  No bleeding.      Relevant Medications   metoprolol tartrate (LOPRESSOR) 25 MG tablet   apixaban (ELIQUIS) 2.5 MG TABS tablet   Other Relevant Orders   EKG 12-Lead (Completed)   MVP (mitral valve prolapse) (Chronic)    Last echo suggested moderate MR, but this is probably in the setting of A. fib.  I do not really hear much of a murmur.  She does have moderate MR and therefore needs to be followed up every couple years.  We will contact her to see if she would be interested in having a follow-up echocardiogram done in the next few months to assure no progression of disease.      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         COVID-19 Education: The signs and symptoms of COVID-19 were discussed with the patient and how to seek care for testing (follow up with PCP or arrange E-visit).   The importance of social distancing and COVID-19 vaccination was discussed today.  She  has had her booster shot The patient is practicing  social distancing & Masking.   I spent a total of 18 minutes with the patient spent in direct patient consultation.  Additional time spent with chart review  / charting (studies, outside notes, etc): 7 Total Time: 25 min   Current medicines are reviewed at length with the patient today.  (+/- concerns) n/a  This visit occurred during the SARS-CoV-2 public health emergency.  Safety protocols were in place, including screening questions prior to the visit, additional usage of staff PPE, and extensive cleaning of exam room while observing appropriate contact time as indicated for disinfecting solutions.  Notice: This dictation was prepared with Dragon dictation along with smaller phrase technology. Any transcriptional errors that result from this process are unintentional and may not be corrected upon review.  Patient Instructions / Medication Changes & Studies & Tests Ordered   Patient Instructions  Medication Instructions:  No changes *If you need a refill on your cardiac medications before your next appointment, please call your pharmacy*   Lab Work: Not needed If you have labs (blood work) drawn today and your tests are completely normal, you will receive your results only by: [name] Kitchen MyChart Message (if you have MyChart) OR . A paper copy in the mail If you have any lab test that is abnormal or we need to change your treatment, we will call you to review the results.   Testing/Procedures: Not needed   Follow-Up: At [facility], you and your health needs are our priority.  As part of our continuing mission to provide you with exceptional heart care, we have created designated Provider Care Teams.  These Care Teams include your primary Cardiologist (physician) and Advanced Practice Providers (APPs -  Physician Assistants and Nurse Practitioners) who all work together to provide you with the care you need, when you need it.  We  recommend signing up for the patient portal called "MyChart".  Sign up information is provided on this After Visit Summary.  MyChart is used to connect with patients for Virtual Visits (Telemedicine).  Patients are able to view lab/test results, encounter notes, upcoming appointments, etc.  Non-urgent messages can be sent to your provider as well.   To learn more about what you can do with MyChart, go to [email or web address].    Your next appointment:   12 month(s)  The format for your next appointment:   In Person  Provider:   Glenetta Hew, MD Studies Ordered:   Orders Placed This Encounter  Procedures  . EKG 12-Lead     Glenetta Hew, M.D., M.S. Interventional Cardiologist   Pager # [phone] Phone # [phone] [address] [place], [place] [address]   Thank you for choosing Heartcare at [facility]!!

## 2019-11-08 NOTE — Patient Instructions (Addendum)

## 2019-11-18 ENCOUNTER — Encounter: Payer: Self-pay | Admitting: Cardiology

## 2019-11-18 NOTE — Assessment & Plan Note (Signed)
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

## 2019-11-18 NOTE — Assessment & Plan Note (Signed)
No recurrent symptoms.  Likely triggered by acute illness. We chose to use Eliquis for anticoagulation because he can use low-dose of there is low bleeding risk.  He does.  She has rheumatic mitral disease, and she and I both acknowledge that this is an off label treatment in this situation, simply was not studied.  She is doing well on low-dose Eliquis.  Rates are relatively well controlled on low-dose beta-blocker.  No changes for now.  No signs of bradycardia or chronotropic incompetence.  No bleeding.

## 2019-11-18 NOTE — Assessment & Plan Note (Addendum)
Last echo suggested moderate MR, but this is probably in the setting of A. fib.  I do not really hear much of a murmur.  She does have moderate MR and therefore needs to be followed up every couple years.  We will contact her to see if she would be interested in having a follow-up echocardiogram done in the next few months to assure no progression of disease.

## 2019-11-22 ENCOUNTER — Telehealth: Payer: Self-pay | Admitting: *Deleted

## 2019-11-22 DIAGNOSIS — I480 Paroxysmal atrial fibrillation: Secondary | ICD-10-CM

## 2019-11-22 DIAGNOSIS — I341 Nonrheumatic mitral (valve) prolapse: Secondary | ICD-10-CM

## 2019-11-22 NOTE — Telephone Encounter (Signed)
Patient return call. RN discussed Dr Ellyn Hack would like to  Recheck echo.  patient is agreement . Aware scheduler will call her.

## 2019-11-22 NOTE — Telephone Encounter (Signed)
Called  And left message for patient to return call to discuss - scheduling a echo  From last visit 11/08/19   To monitor your mitral valve  If stable will probably not need to check

## 2019-11-22 NOTE — Telephone Encounter (Signed)
-----   Message from Leonie Man, MD sent at 11/18/2019  6:12 PM EST ----- Regarding: Forgot to order f/u Echo for Mitral Valve Prolapse & MR. Ivin Booty - I sent your her note.    Totally forgot to order Echo.     Glenetta Hew, MD

## 2019-12-11 DIAGNOSIS — Z7901 Long term (current) use of anticoagulants: Secondary | ICD-10-CM | POA: Diagnosis not present

## 2019-12-11 DIAGNOSIS — Z87898 Personal history of other specified conditions: Secondary | ICD-10-CM | POA: Diagnosis not present

## 2019-12-12 ENCOUNTER — Other Ambulatory Visit: Payer: Self-pay

## 2019-12-12 ENCOUNTER — Ambulatory Visit (HOSPITAL_COMMUNITY): Payer: Medicare Other | Attending: Internal Medicine

## 2019-12-12 DIAGNOSIS — I480 Paroxysmal atrial fibrillation: Secondary | ICD-10-CM | POA: Diagnosis not present

## 2019-12-12 DIAGNOSIS — I341 Nonrheumatic mitral (valve) prolapse: Secondary | ICD-10-CM | POA: Insufficient documentation

## 2019-12-12 HISTORY — PX: TRANSTHORACIC ECHOCARDIOGRAM: SHX275

## 2019-12-12 LAB — ECHOCARDIOGRAM COMPLETE
AR max vel: 1.45 cm2
AV Area VTI: 1.79 cm2
AV Area mean vel: 1.56 cm2
AV Mean grad: 7 mmHg
AV Peak grad: 15.1 mmHg
Ao pk vel: 1.94 m/s
Area-P 1/2: 2.79 cm2
MV M vel: 5.75 m/s
MV Peak grad: 132.3 mmHg
P 1/2 time: 475 msec
Radius: 0.6 cm
S' Lateral: 2.3 cm

## 2019-12-16 ENCOUNTER — Emergency Department (HOSPITAL_COMMUNITY)
Admission: EM | Admit: 2019-12-16 | Discharge: 2019-12-16 | Disposition: A | Discharge status: Home / Self Care | Payer: Medicare Other | Attending: Emergency Medicine | Admitting: Emergency Medicine

## 2019-12-16 ENCOUNTER — Encounter (HOSPITAL_COMMUNITY): Payer: Self-pay | Admitting: Emergency Medicine

## 2019-12-16 DIAGNOSIS — R040 Epistaxis: Secondary | ICD-10-CM | POA: Insufficient documentation

## 2019-12-16 DIAGNOSIS — R58 Hemorrhage, not elsewhere classified: Secondary | ICD-10-CM | POA: Diagnosis not present

## 2019-12-16 DIAGNOSIS — I10 Essential (primary) hypertension: Secondary | ICD-10-CM | POA: Diagnosis not present

## 2019-12-16 DIAGNOSIS — I480 Paroxysmal atrial fibrillation: Secondary | ICD-10-CM | POA: Insufficient documentation

## 2019-12-16 DIAGNOSIS — Z7901 Long term (current) use of anticoagulants: Secondary | ICD-10-CM | POA: Insufficient documentation

## 2019-12-16 LAB — CBC
HCT: 40.8 % (ref 36.0–46.0)
Hemoglobin: 13.4 g/dL (ref 12.0–15.0)
MCH: 31.9 pg (ref 26.0–34.0)
MCHC: 32.8 g/dL (ref 30.0–36.0)
MCV: 97.1 fL (ref 80.0–100.0)
Platelets: 210 10*3/uL (ref 150–400)
RBC: 4.2 MIL/uL (ref 3.87–5.11)
RDW: 12.7 % (ref 11.5–15.5)
WBC: 12.3 10*3/uL — ABNORMAL HIGH (ref 4.0–10.5)
nRBC: 0 % (ref 0.0–0.2)

## 2019-12-16 LAB — BASIC METABOLIC PANEL
Anion gap: 8 (ref 5–15)
BUN: 30 mg/dL — ABNORMAL HIGH (ref 8–23)
CO2: 27 mmol/L (ref 22–32)
Calcium: 9.3 mg/dL (ref 8.9–10.3)
Chloride: 106 mmol/L (ref 98–111)
Creatinine, Ser: 0.86 mg/dL (ref 0.44–1.00)
GFR, Estimated: >60 mL/min (ref >60)
Glucose, Bld: 108 mg/dL — ABNORMAL HIGH (ref 70–99)
Potassium: 4.2 mmol/L (ref 3.5–5.1)
Sodium: 141 mmol/L (ref 135–145)

## 2019-12-16 MED ORDER — TRANEXAMIC ACID FOR EPISTAXIS
500.0000 mg | Freq: Once | TOPICAL | Status: AC
  Administered 2019-12-16: 500 mg via TOPICAL
  Filled 2019-12-16: qty 10

## 2019-12-16 MED ORDER — CEPHALEXIN 500 MG PO CAPS: 500.0000 mg | ORAL_CAPSULE | Freq: Two times a day (BID) | ORAL | 0 refills | Status: AC

## 2019-12-16 MED ORDER — OXYMETAZOLINE HCL 0.05 % NA SOLN: 1.0000 | Freq: Once | NASAL | Status: DC

## 2019-12-16 MED ORDER — OXYMETAZOLINE HCL 0.05 % NA SOLN
3.0000 | Freq: Once | NASAL | Status: AC
  Administered 2019-12-16: 3 via NASAL
  Filled 2019-12-16: qty 30

## 2019-12-16 MED ORDER — ACETAMINOPHEN 325 MG PO TABS
650.0000 mg | ORAL_TABLET | Freq: Once | ORAL | Status: AC
  Administered 2019-12-16: 650 mg via ORAL
  Filled 2019-12-16: qty 2

## 2019-12-16 NOTE — Discharge Instructions (Addendum)
Please hold your Eliquis for 2 days and then resume it.  Please make sure to follow-up with the ear nose and throat doctor whose phone number I have provided in the discharge paperwork within 2 days. Take the antibiotic as directed until finished. Return to the ER if you have significant nose bleeding again.

## 2019-12-16 NOTE — ED Triage Notes (Signed)
Per EMS, patient from home, nosebleed started this morning. Hx of same x2 weeks ago. Uncontrolled with EMS x40 minutes.

## 2019-12-16 NOTE — ED Provider Notes (Addendum)
[facility] DEPT Provider Note   CSN: [id number] Arrival date & time: 12/16/19  1047     History Chief Complaint  Patient presents with  . Epistaxis    Lisa Lang is a 86 y.o. female.  HPI 86 year old female with a history of hyperlipidemia, mitral valve prolapse, paroxysmal A. fib on Eliquis, arthritis presents to the ER with complaints of epistaxis.  States that she had a similar episode of epistaxis about 2 weeks ago, however this resolved on its own.  Has been bleeding out of the right nare without relief.  Denies any dizziness, syncope.  EMS was unable to control x40 minutes.    Past Medical History:  Diagnosis Date  . Arthritis   . Diverticulitis 1995   And 2007  . Diverticulosis   . Hyperlipidemia LDL goal <100   . Mitral valve prolapse    Bileaflet MVP with moderate MR.  [name] Kitchen PAF (paroxysmal atrial fibrillation) ([place]) 11/2017   CHA2DS2 VASc score > 3 (Age >28 and female sex), --> Eliquis & Metoprolol     Patient Active Problem List   Diagnosis Date Noted  . Fatigue due to treatment 11/14/2017  . Hyperlipidemia LDL goal <100 11/09/2017  . MVP (mitral valve prolapse) 11/09/2017  . PAF (paroxysmal atrial fibrillation) - CHA2DS2Vasc = 4. Low dose Eliquis 09/27/2017  . Ovarian cyst 09/26/2017    Past Surgical History:  Procedure Laterality Date  . CATARACT EXTRACTION, BILATERAL    . TRANSTHORACIC ECHOCARDIOGRAM  3/'19, 9/'19   03/2017: Normal LVET (60-65%).  Gr 1 DD. Mild AI.  Mild MR with bileaflet MVP.  Mild pulm HTN; ECHO 09/27/2017: Normal LVEF w/o RWMA. Diastolic dysfxn with elevated LVEDP.  AoV sclerosis with Mod AI.  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  Severe RA dilation.       OB History   No obstetric history on file.     Family History  Problem Relation Age of Onset  . Cervical cancer Mother   . CAD Father   . Lung cancer Sister   . Stroke Neg Hx   . Diabetes Neg Hx     Social  History   Tobacco Use  . Smoking status: Never Smoker  . Smokeless tobacco: Never Used  Substance Use Topics  . Alcohol use: No  . Drug use: No    Home Medications Prior to Admission medications   Medication Sig Start Date End Date Taking? Authorizing Provider  alendronate (FOSAMAX) 70 MG tablet Take 1 tablet by mouth once a week. 09/10/17   [provider]  apixaban (ELIQUIS) 2.5 MG TABS tablet Take 1 tablet (2.5 mg total) by mouth 2 (two) times daily. 11/08/19   Leonie Man, MD  atorvastatin (LIPITOR) 40 MG tablet Take 40 mg by mouth at bedtime. 09/05/17   [provider]  calcium carbonate (OS-CAL - DOSED IN MG OF ELEMENTAL CALCIUM) 1250 MG tablet Take 1 tablet by mouth daily with breakfast.    [provider]  cephALEXin (KEFLEX) 500 MG capsule Take 1 capsule (500 mg total) by mouth 2 (two) times daily for 7 days. 12/16/19 12/23/19  Garald Balding, PA-C  metoprolol tartrate (LOPRESSOR) 25 MG tablet TAKE AS INSTRUCTED BY YOUR PRESCRIBER 11/08/19   Leonie Man, MD  Multiple Vitamins-Minerals (MULTIVITAMIN PO) Take 1 tablet by mouth daily.    [provider]  Vitamin D, Ergocalciferol, 2000 units CAPS Take 2,000 Units by mouth daily. Twice a month  [provider]    Allergies    Amoxicillin, Ciprofloxacin, Lidocaine, and Sulfa antibiotics  Review of Systems   Review of Systems  Constitutional: Negative for chills, fatigue and fever.  HENT: Positive for nosebleeds. Negative for sinus pain, sore throat and trouble swallowing.   Neurological: Negative for dizziness and weakness.    Physical Exam Updated Vital Signs BP (!) 131/58 (BP Location: Right Arm)   Pulse 72   Temp 97.8 F (36.6 C) (Oral)   Resp 18   SpO2 96%   Physical Exam Vitals and nursing note reviewed.  Constitutional:      General: She is not in acute distress.    Appearance: She is well-developed and well-nourished.  HENT:     Head: Normocephalic and  atraumatic.     Comments: Right nare with evidence of consistent bleeding. Poor visualization due to volume of blood. No overt bleeding in the posterior oropharynx. No evidence of bleeding from left nare      Mouth/Throat:     Mouth: Mucous membranes are moist.     Pharynx: No oropharyngeal exudate or posterior oropharyngeal erythema.  Eyes:     Conjunctiva/sclera: Conjunctivae normal.  Cardiovascular:     Rate and Rhythm: Normal rate and regular rhythm.     Heart sounds: No murmur heard.   Pulmonary:     Effort: Pulmonary effort is normal. No respiratory distress.     Breath sounds: Normal breath sounds.  Abdominal:     Palpations: Abdomen is soft.     Tenderness: There is no abdominal tenderness.  Musculoskeletal:        General: No edema. Normal range of motion.     Cervical back: Neck supple.  Skin:    General: Skin is warm and dry.  Neurological:     General: No focal deficit present.     Mental Status: She is alert.  Psychiatric:        Mood and Affect: Mood and affect normal.     ED Results / Procedures / Treatments   Labs (all labs ordered are listed, but only abnormal results are displayed) Labs Reviewed  CBC - Abnormal; Notable for the following components:      Result Value   WBC 12.3 (*)    All other components within normal limits  BASIC METABOLIC PANEL - Abnormal; Notable for the following components:   Glucose, Bld 108 (*)    BUN 30 (*)    All other components within normal limits    EKG None  Radiology No results found.  Procedures .Epistaxis Management  Date/Time: 12/16/2019 3:32 PM Performed by: Garald Balding, PA-C Authorized by: Garald Balding, PA-C   Consent:    Consent obtained:  Verbal   Risks discussed:  Bleeding, infection, nasal injury and pain   Alternatives discussed:  No treatment, alternative treatment and observation Universal protocol:    Patient identity confirmed:  Verbally with patient Anesthesia:    Anesthesia  method:  None Procedure details:    Treatment site:  R anterior   Treatment method:  Nasal balloon and thrombin   Treatment complexity:  Limited   Treatment episode: initial   Post-procedure details:    Assessment:  Bleeding decreased   Procedure completion:  Tolerated   (including critical care time)  Medications Ordered in ED Medications  oxymetazoline (AFRIN) 0.05 % nasal spray 3 spray (3 sprays Each Nare Given 12/16/19 1115)  tranexamic acid (CYKLOKAPRON) 1000 MG/10ML topical solution 500 mg (500 mg Topical  Given by Other 12/16/19 1115)  acetaminophen (TYLENOL) tablet 650 mg (650 mg Oral Given 12/16/19 1224)  tranexamic acid (CYKLOKAPRON) 1000 MG/10ML topical solution 500 mg (500 mg Topical Given by Other 12/16/19 1402)    ED Course  I have reviewed the triage vital signs and the nursing notes.  Pertinent labs & imaging results that were available during my care of the patient were reviewed by me and considered in my medical decision making (see chart for details).    MDM Rules/Calculators/A&P                          86 year old female with complaints of nosebleed.  Poor visibility secondary to bleeding out of the right nare.  Administered Afrin, topical thromboxane for approximately 10 minutes, with no relief in bleeding.  Rhino Rocket was administered by my supervising physician Dr. Wyvonnia Dusky.  On reevaluation, bleeding improved because continued to steadily ooze.  Administered another dose of topical thromboxane.  Bleeding subsided.  Patient was instructed to hold Eliquis for 2 days and then resume.  We will start her on some Keflex as per discussion with Dr. Wyvonnia Dusky, refer to ENT.  Patient had a strict return precautions if bleeding resumes, worsens.  She voices understanding and is agreeable.  Basic lab work with reassuring hemoglobin of 13.4, no other significant abnormalities noted. Stable for discharge at this time   This was a shared visit with my supervising physician Dr.  Wyvonnia Dusky who independently saw and evaluated the patient & provided guidance in evaluation/management/disposition ,in agreement with care  Final Clinical Impression(s) / ED Diagnoses Final diagnoses:  Epistaxis    Rx / DC Orders ED Discharge Orders         Ordered    cephALEXin (KEFLEX) 500 MG capsule  2 times daily        12/16/19 1529              Lyndel Safe 12/16/19 1535    Ezequiel Essex, MD 12/17/19 [phone]

## 2019-12-17 ENCOUNTER — Telehealth: Payer: Self-pay | Admitting: *Deleted

## 2019-12-17 NOTE — Telephone Encounter (Signed)
The Lisa Lang has been notified of the result and verbalized understanding.  All questions (if any) were answered.  Appointment schedule for Jan 14, 2020 at 11:40 am. Lisa Lang verbalized understanding.  Lisa Lang wanted Dr Ellyn Hack to know she had   Very bad nose bleed  That required her to be taken to the ER by EMT. Lisa Lang's nose was packed and ordered to hold Eliquis for 2 days. She will be going to [facility] to have her nose unpancked on Thursday. She wanted Dr Ellyn Hack to be aware.  Raiford Simmonds, RN 12/17/2019 4:16 PM

## 2019-12-17 NOTE — Telephone Encounter (Signed)
-----   Message from Leonie Man, MD sent at 12/15/2019  6:37 PM EST ----- Echocardiogram shows stable ejection fraction in the normal range of 60 to 65%.  There is no wall motion abnormalities.  Relatively normal filling pattern for age. Right ventricle appears to be working well.  Both atria are mildly dilated which is not abnormal.  The mitral valve does appear to be myxomatous (stretched out/patulous) with moderate to severe regurgitation.  There is no narrowing.  Both leaflets do prolapse.  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  The aortic valve has moderate calcification with moderate regurgitation (back leaking) but only mild stenosis/narrowing.  Despite all these findings, the right atrial pressures are still still relatively normal.  Compared to last study left ventricle stable.  Aortic valve is stable. The mitral regurgitation does seem to have gotten worse.  Is now moderate to severe as opposed to just moderate.  Tricuspid valve back leaking is now moderate as opposed to mild to moderate.  This does show that there has been progression of disease.  If we are going to think about it, this may be the time to start thinking about whether or not we consider referral for valve procedure.  Perhaps we should have her scheduled to come in to see me (ordered virtual) in January.  Glenetta Hew, MD

## 2019-12-18 NOTE — Telephone Encounter (Signed)
Thank you :)

## 2019-12-21 DIAGNOSIS — R040 Epistaxis: Secondary | ICD-10-CM | POA: Diagnosis not present

## 2019-12-21 DIAGNOSIS — Z7901 Long term (current) use of anticoagulants: Secondary | ICD-10-CM | POA: Diagnosis not present

## 2020-01-14 ENCOUNTER — Other Ambulatory Visit: Payer: Self-pay

## 2020-01-14 ENCOUNTER — Encounter: Payer: Self-pay | Admitting: Cardiology

## 2020-01-14 ENCOUNTER — Ambulatory Visit (INDEPENDENT_AMBULATORY_CARE_PROVIDER_SITE_OTHER): Payer: Medicare Other | Admitting: Cardiology

## 2020-01-14 VITALS — BP 154/70 | HR 60 | Ht 63.0 in | Wt 110.8 lb

## 2020-01-14 DIAGNOSIS — I340 Nonrheumatic mitral (valve) insufficiency: Secondary | ICD-10-CM | POA: Diagnosis not present

## 2020-01-14 DIAGNOSIS — E785 Hyperlipidemia, unspecified: Secondary | ICD-10-CM

## 2020-01-14 DIAGNOSIS — I341 Nonrheumatic mitral (valve) prolapse: Secondary | ICD-10-CM | POA: Diagnosis not present

## 2020-01-14 DIAGNOSIS — I480 Paroxysmal atrial fibrillation: Secondary | ICD-10-CM | POA: Diagnosis not present

## 2020-01-14 DIAGNOSIS — R5383 Other fatigue: Secondary | ICD-10-CM

## 2020-01-14 NOTE — Progress Notes (Signed)
Primary Care Provider: Kirby Funk, MD Cardiologist: Bryan Lemma, MD Electrophysiologist: None  Clinic Note: Chief Complaint  Patient presents with  . Mitral Valve Prolapse    With moderate to severe MR  . Mitral Regurgitation  . Atrial Fibrillation  . Follow-up    Echocardiogram results.  ---------------------------  Problem List Items Addressed This Visit    PAF (paroxysmal atrial fibrillation) - CHA2DS2Vasc = 4. Low dose Eliquis (Chronic)    Initial episode was during acute illness.  He has not had any symptoms to suggest recurrence of A. fib.  She does have PACs on exam and on EKG, but no clear indication that she has had further A. Fib.  Plan: Continue reduced dose Eliquis for anticoagulation given age. She is on low-dose beta-blocker tolerating relatively well.-Unable to titrate further because of bradycardia.      Relevant Orders   EKG 12-Lead (Completed)   Hyperlipidemia LDL goal <100 (Chronic)    Labs from March showed LDL of 163.  She is now on atorvastatin.  She will be having labs checked soon by PCP.  Would like to see LDL down close to 100, but acknowledges that would not want to be too over the aggressive      MVP (mitral valve prolapse) (Chronic)    Echo now shows severe bileaflet MVP with moderate-severe MR.  I do not think given her advanced age, the for surgical evaluation, especially in the light of that she is not having any symptoms of dyspnea or CHF.  I did discuss with Dr.Cooper whether or not she would potentially be a candidate for mitral valve clip. To summarize his response: Yes she would be a candidate, but if truly asymptomatic and no pulmonary pretension or LV dysfunction, recommendation would be to delay invasive procedure and avoid doing clip.  Indication would be to be more conservative given her advanced age.  He will be happy to see in consult if symptoms warrant.  I discussed the findings on the echocardiogram and I discussion with  Dr. Excell Seltzer with her.  She is clearly asymptomatic from a CHF standpoint.  Her EF is still preserved, and therefore she would prefer to avoid invasive procedures.  We will continue to monitor closely.  Consider rechecking echocardiogram next year depending on symptoms.  Technically, by guidelines, does not need SBE prophylaxis.      Relevant Orders   EKG 12-Lead (Completed)   Moderate to severe mitral regurgitation - Primary (Chronic)    Moderate to severe MR with MVP on echo.  Again symptoms discussed.  She is not symptomatic.  Per discussion with Dr. Excell Seltzer noted elsewhere, recommendation would be probably to continue with conservative management to include blood pressure and heart rate control.  MitraClip would be an option if she were to become symptomatic.      Relevant Orders   EKG 12-Lead (Completed)   Fatigue due to treatment    Had previously been on beta-blocker, stopped because of fatigue and bradycardia.  Now doing much better.        ======================================  HPI:    Lisa Lang is a 87 y.o. female with PAROXYSMAL ATRIAL FIBRILLATION, BILEAFLET MITRAL VALVE PROLAPSE WITH MODERATE TO SEVERE MR below who presents today for 37-month follow-up to discuss results of Echocardiogram..   September 2019-initial consult for A. fib RVR in the setting of abdominal pain secondary to ovarian cyst -> spontaneously converted to NSR.   CHA2DS2 VASc score>3(Age >75 and female sex), --> Eliquis &  Metoprolol25 mg twice daily  October 2020: Doing well.  Good spirits.  Seasonal allergies with congestion.  No sensation of A. fib.  Was liberated after her husband's death-no longer having caregiver responsibilities-sad but overall better.  Very active except that he.  Mild end of day edema.  HAILY CALEY was last seen on November 08, 2019 for annual follow-up.  She was still doing very well.  Occasionally losing her footing and having some balance issues--she describes it as  being clumsy.  Enjoys doing yard work and gardening as long as too high.  Likes walking in the cold weather.  No signs of A. fib.  No chest pain or dyspnea. -> 2D echo ordered-she now presents for follow-up.  Recent Hospitalizations: None   Reviewed  CV studies:    The following studies were reviewed today: (if available, images/films reviewed: From Epic Chart or Care Everywhere) . Echo 12/12/2019: EF 60 to 65%.  No R WMA.  Unable to assess diastolic pressures.  Normal RV pressures.  Mild biatrial enlargement.  Moderate aortic calcification - no AS.  Severe BI-Leaflet MVP with Mod-Severe MR . Reassuring features include stable EF greater than 60%, normal RV pressures.  Interval History:   AMSI GRIMLEY returns here today for 37-month follow-up still stating that she is doing well.  She is smiling and really has no major complaints.  Still has little balance issues, but is still active walking.  She says that maybe takes a little while to get going but when she does get going says he feels better than she does at rest.  She still has a somewhat unsteady gait and is fearful of falling.  She says she is very anxious today coming in because of the abnormal findings.  Blood pressure is usually not that high. She denies any lightheadedness or dizziness, syncope or near syncope, TIA or ossifications.  No CHF symptoms of PND, orthopnea or significant edema.  No rapid rate or palpitations.  To suggest recurrence of A. Fib.  CV Review of Symptoms (Summary): no chest pain or dyspnea on exertion positive for - Mildly day swelling of the goes down with foot elevation.  Somewhat unsteady gait.  Exercise intolerance actually improves with walking.  negative for - chest pain, dyspnea on exertion, irregular heartbeat, orthopnea, palpitations, paroxysmal nocturnal dyspnea, rapid heart rate, shortness of breath or Lightheadedness or dizziness, syncope/near syncope or TIA/amaurosis fugax, claudication  The patient  does not have symptoms concerning for COVID-19 infection (fever, chills, cough, or new shortness of breath).   REVIEWED OF SYSTEMS   Review of Systems  Constitutional: Negative for malaise/fatigue and weight loss.  HENT: Positive for congestion. Negative for nosebleeds.   Respiratory: Positive for cough (And sneezing).   Gastrointestinal: Negative for blood in stool and melena.  Genitourinary: Negative for hematuria.  Musculoskeletal: Negative for falls and joint pain.  Neurological: Negative for dizziness and headaches.  Endo/Heme/Allergies: Positive for environmental allergies (Has congestion and sneezing.).   I have reviewed and (if needed) personally updated the patient's problem list, medications, allergies, past medical and surgical history, social and family history.   PAST MEDICAL HISTORY   Past Medical History:  Diagnosis Date  . Arthritis   . Diverticulitis 1995   And 2007  . Hyperlipidemia LDL goal <100   . Mitral valve prolapse    Bileaflet MVP with moderate MR.  [name] Kitchen PAF (paroxysmal atrial fibrillation) ([place]) 11/2017   CHA2DS2 VASc score > 3 (Age >73 and female sex), -->  Eliquis & Metoprolol     PAST SURGICAL HISTORY   Past Surgical History:  Procedure Laterality Date  . CATARACT EXTRACTION, BILATERAL    . TRANSTHORACIC ECHOCARDIOGRAM  3/'19, 9/'19   03/2017: Normal LVET (60-65%).  Gr 1 DD. Mild AI.  Mild MR with bileaflet MVP.  Mild pulm HTN; ECHO 09/27/2017: Normal LVEF w/o RWMA. Diastolic dysfxn with elevated LVEDP.  AoV sclerosis with Mod AI.  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  Severe RA dilation.    . TRANSTHORACIC ECHOCARDIOGRAM  12/12/2019    EF 60 to 65%.  No R WMA.  Unable to assess diastolic pressures.  Normal RV pressures.  Mild biatrial enlargement.  Moderate aortic calcification - no AS.  Severe BI-Leaflet MVP with Mod-Severe MR   September 2019 echo: Diffuse thickening of anterior leaflet consistent with myxomatous  perforation.  Moderate holosystolic prolapse involving anterior echo posterior leaflet.  Moderate mitral vegetation.  Immunization History  Administered Date(s) Administered  . Influenza, High Dose Seasonal PF 10/09/2018  . PFIZER(Purple Top)SARS-COV-2 Vaccination 01/25/2019, 02/15/2019    MEDICATIONS/ALLERGIES   Current Meds  Medication Sig  . alendronate (FOSAMAX) 70 MG tablet Take 1 tablet by mouth once a week.  [name] Kitchen apixaban (ELIQUIS) 2.5 MG TABS tablet Take 1 tablet (2.5 mg total) by mouth 2 (two) times daily.  [name] Kitchen atorvastatin (LIPITOR) 40 MG tablet Take 40 mg by mouth at bedtime.  . calcium carbonate (OS-CAL - DOSED IN MG OF ELEMENTAL CALCIUM) 1250 MG tablet Take 1 tablet by mouth daily with breakfast.  . metoprolol tartrate (LOPRESSOR) 25 MG tablet TAKE 1 TABLET TWICE A DAY  . Multiple Vitamins-Minerals (MULTIVITAMIN PO) Take 1 tablet by mouth daily.  . Vitamin D, Ergocalciferol, 2000 units CAPS Take 2,000 Units by mouth daily. Twice a month     Allergies  Allergen Reactions  . Amoxicillin   . Ciprofloxacin   . Lidocaine   . Other Other (See Comments)  . Shellfish Allergy Other (See Comments)  . Sulfa Antibiotics     SOCIAL HISTORY/FAMILY HISTORY   Reviewed in Epic:  Pertinent findings: no change - still active as ever  OBJCTIVE -PE, EKG, labs   Wt Readings from Last 3 Encounters:  01/14/20 110 lb 12.8 oz (50.3 kg)  11/08/19 109 lb (49.4 kg)  10/19/18 116 lb (52.6 kg)    Physical Exam: BP (!) 154/70   Pulse 60   Ht 5\' 3"  (1.6 m)   Wt 110 lb 12.8 oz (50.3 kg)   BMI 19.63 kg/m  Physical Exam Vitals reviewed.  Constitutional:      General: She is not in acute distress.    Appearance: Normal appearance. She is normal weight. She is not ill-appearing or toxic-appearing.  HENT:     Head: Normocephalic and atraumatic.  Neck:     Vascular: No carotid bruit or hepatojugular reflux.  Cardiovascular:     Rate and Rhythm: Normal rate and regular rhythm.  No  extrasystoles are present.    Chest Wall: PMI is not displaced.     Pulses: Intact distal pulses.     Heart sounds: S1 normal. Heart sounds not distant. Murmur heard.   Medium-pitched blowing holosystolic murmur is present with a grade of 1/6 at the apex radiating to the back. No gallop.   Pulmonary:     Effort: Pulmonary effort is normal. No respiratory distress.     Breath sounds: Normal breath sounds.  Chest:     Chest wall: No  tenderness.  Musculoskeletal:        General: Swelling (Trivial) present. Normal range of motion.     Cervical back: Normal range of motion.  Skin:    General: Skin is warm and dry.  Neurological:     General: No focal deficit present.     Mental Status: She is alert. Mental status is at baseline.     Cranial Nerves: No cranial nerve deficit.     Adult ECG Report  Rate: 60 ;  Rhythm: normal sinus rhythm and Incomplete right bundle branch block.  Otherwise normal axis, intervals and durations.;   Narrative Interpretation: Stable EKG  Recent Labs: Due to have labs checked by PCP in January.  March 08, 2019-TC 251, TG 186, HDL 54, LDL 163.    December 16, 2019: Hgb 13.4, Cr 0.86, K+ 4.2.   =====================================================   COVID-19 Education: The signs and symptoms of COVID-19 were discussed with the patient and how to seek care for testing (follow up with PCP or arrange E-visit).   The importance of social distancing and COVID-19 vaccination was discussed today.  She has had her booster shot The patient is practicing social distancing & Masking.   I spent a total of 25minutes with the patient spent in direct patient consultation.  Additional time spent with chart review  / charting (studies, outside notes, etc): 15 Total Time: 60min   Current medicines are reviewed at length with the patient today.  (+/- concerns) n/a  This visit occurred during the SARS-CoV-2 public health emergency.  Safety protocols were in place,  including screening questions prior to the visit, additional usage of staff PPE, and extensive cleaning of exam room while observing appropriate contact time as indicated for disinfecting solutions.  Notice: This dictation was prepared with Dragon dictation along with smaller phrase technology. Any transcriptional errors that result from this process are unintentional and may not be corrected upon review.  Patient Instructions / Medication Changes & Studies & Tests Ordered   Patient Instructions  Medication Instructions:  No changes  *If you need a refill on your cardiac medications before your next appointment, please call your pharmacy*   Lab Work: Not needed    Testing/Procedures: Not needed   Follow-Up: At [facility], you and your health needs are our priority.  As part of our continuing mission to provide you with exceptional heart care, we have created designated Provider Care Teams.  These Care Teams include your primary Cardiologist (physician) and Advanced Practice Providers (APPs -  Physician Assistants and Nurse Practitioners) who all work together to provide you with the care you need, when you need it.     Your next appointment:   6 month(s)  The format for your next appointment:   In Person  Provider:   Glenetta Hew, MD   Other Instructions  Continue to monitor for any symptoms - exertional shortness of breath , or shortness of breath whiling down.  Rapid heart rate - like afib.  If these symptoms become more pronounce contact theo office. Studies Ordered:   Orders Placed This Encounter  Procedures  . EKG 12-Lead     Glenetta Hew, M.D., M.S. Interventional Cardiologist   Pager # [phone] Phone # [phone] [address]. [place], [place] [address]   Thank you for choosing [facility] at [facility]!!

## 2020-01-14 NOTE — Patient Instructions (Signed)
Medication Instructions:  No changes  *If you need a refill on your cardiac medications before your next appointment, please call your pharmacy*   Lab Work: Not needed    Testing/Procedures: Not needed   Follow-Up: At [facility], you and your health needs are our priority.  As part of our continuing mission to provide you with exceptional heart care, we have created designated Provider Care Teams.  These Care Teams include your primary Cardiologist (physician) and Advanced Practice Providers (APPs -  Physician Assistants and Nurse Practitioners) who all work together to provide you with the care you need, when you need it.     Your next appointment:   6 month(s)  The format for your next appointment:   In Person  Provider:   Glenetta Hew, MD   Other Instructions  Continue to monitor for any symptoms - exertional shortness of breath , or shortness of breath whiling down.  Rapid heart rate - like afib.  If these symptoms become more pronounce contact theo office.

## 2020-01-15 ENCOUNTER — Telehealth: Payer: Self-pay | Admitting: Cardiology

## 2020-01-15 NOTE — Telephone Encounter (Signed)
Patient called into office stating that she may require prophylactic antibiotics when having any dental procedures done from here on out. Patient states that she forgot to ask yesterday at her appointment  With Dr. Ellyn Hack if a dental cleaning would be included in the need for antibiotics. Patient states she is going to schedule her cleaning soon. Advised patient I would forward message to Dr. Ellyn Hack to review and advise. Patient verbalized understanding.

## 2020-01-15 NOTE — Telephone Encounter (Signed)
Lisa Lang is calling with some additional questions for the nurse about what Dr. Ellyn Hack advised her of yesterday during her appointment when it comes to dental clearance. Please advise.

## 2020-01-17 NOTE — Telephone Encounter (Signed)
RN asked question to D.O.D for patient .  Per Dr Martinique ,  SBE prophylaxis is not needed at present time- due to    Echo 12/12/2019: EF 60 to 65%.  No R WMA.  Unable to assess diastolic pressures.  Normal RV pressures.  Mild biatrial enlargement.  Moderate aortic calcification - no AS.  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     RN called patient - informed patient ,she did not need antibiotics. Patient states what should she do if she need any procedure from dentla office.   Rn informed patient to informed dental office at least week or more prior to nay  Procedures ,so Dental office can send an  official written clearance to be sent to them for their files.  patient voiced understanding.

## 2020-01-22 ENCOUNTER — Encounter: Payer: Self-pay | Admitting: Cardiology

## 2020-01-22 NOTE — Assessment & Plan Note (Signed)
Had previously been on beta-blocker, stopped because of fatigue and bradycardia.  Now doing much better.

## 2020-01-22 NOTE — Assessment & Plan Note (Signed)
Moderate to severe MR with MVP on echo.  Again symptoms discussed.  She is not symptomatic.  Per discussion with Dr. Burt Knack noted elsewhere, recommendation would be probably to continue with conservative management to include blood pressure and heart rate control.  MitraClip would be an option if she were to become symptomatic.

## 2020-01-22 NOTE — Assessment & Plan Note (Signed)
Labs from March showed LDL of 163.  She is now on atorvastatin.  She will be having labs checked soon by PCP.  Would like to see LDL down close to 100, but acknowledges that would not want to be too over the aggressive

## 2020-01-22 NOTE — Assessment & Plan Note (Addendum)
Echo now shows severe bileaflet MVP with moderate-severe MR.  I do not think given her advanced age, the for surgical evaluation, especially in the light of that she is not having any symptoms of dyspnea or CHF.  I did discuss with Dr.Cooper whether or not she would potentially be a candidate for mitral valve clip. To summarize his response: Yes she would be a candidate, but if truly asymptomatic and no pulmonary pretension or LV dysfunction, recommendation would be to delay invasive procedure and avoid doing clip.  Indication would be to be more conservative given her advanced age.  He will be happy to see in consult if symptoms warrant.  I discussed the findings on the echocardiogram and I discussion with Dr. Burt Knack with her.  She is clearly asymptomatic from a CHF standpoint.  Her EF is still preserved, and therefore she would prefer to avoid invasive procedures.  We will continue to monitor closely.  Consider rechecking echocardiogram next year depending on symptoms.  Technically, by guidelines, does not need SBE prophylaxis.

## 2020-01-22 NOTE — Assessment & Plan Note (Signed)
Initial episode was during acute illness.  He has not had any symptoms to suggest recurrence of A. fib.  She does have PACs on exam and on EKG, but no clear indication that she has had further A. Fib.  Plan: Continue reduced dose Eliquis for anticoagulation given age. She is on low-dose beta-blocker tolerating relatively well.-Unable to titrate further because of bradycardia.

## 2020-02-14 DIAGNOSIS — L03113 Cellulitis of right upper limb: Secondary | ICD-10-CM | POA: Diagnosis not present

## 2020-02-14 DIAGNOSIS — W5503XA Scratched by cat, initial encounter: Secondary | ICD-10-CM | POA: Diagnosis not present

## 2020-02-29 IMAGING — CT CT ABD-PELV W/ CM
2 of 5 series · 14 of 46 positions shown, 16 images · IV contrast (omnipaque)
Comparison: CT 04/07/2010

CLINICAL DATA: 89-year-old with lower abdominal pain. History of
diverticulosis.

EXAM:
CT ABDOMEN AND PELVIS WITH CONTRAST
TECHNIQUE: Multidetector CT imaging of the abdomen and pelvis was performed
using the standard protocol following bolus administration of
intravenous contrast.
CONTRAST:  100mL OMNIPAQUE IOHEXOL 300 MG/ML  SOLN

[Series 3: abdomen 5.0 · axial · 0.63mm/px · z∈[-334,+6]mm · 11 of 82 slices shown, 13 images]
[im 7/82  soft-tissue]
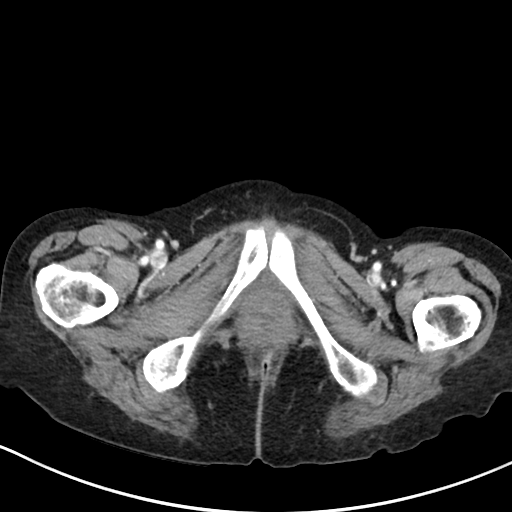
[im 7/82  bone]
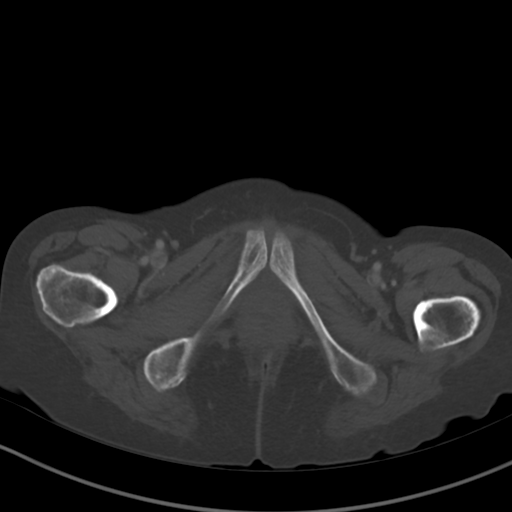
[im 14/82  soft-tissue]
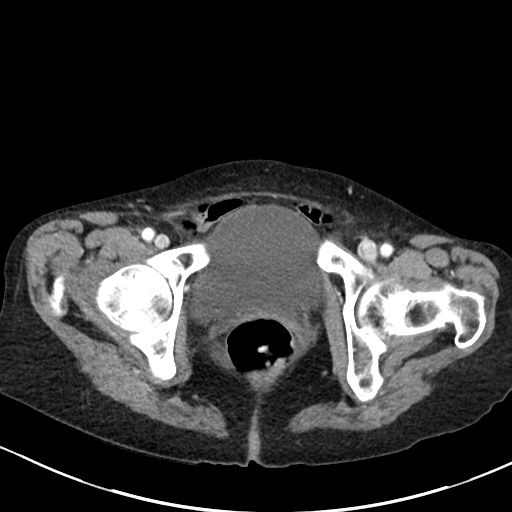
[im 21/82  soft-tissue]
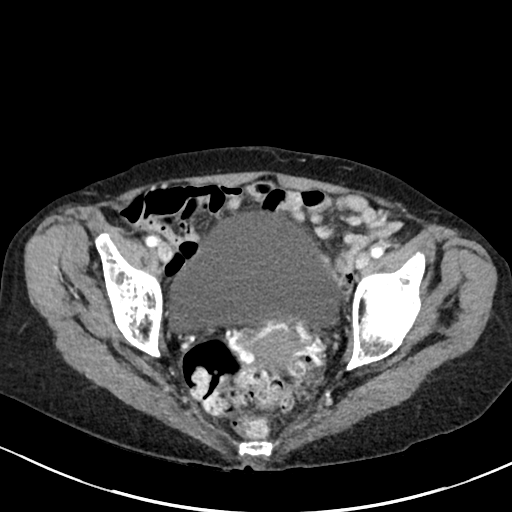
[im 28/82  soft-tissue]
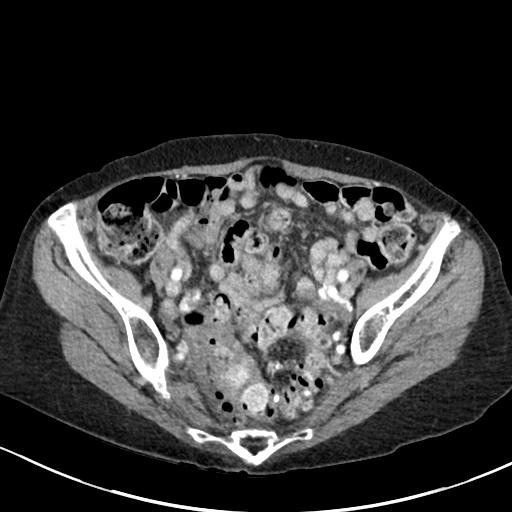
[im 34/82  soft-tissue]
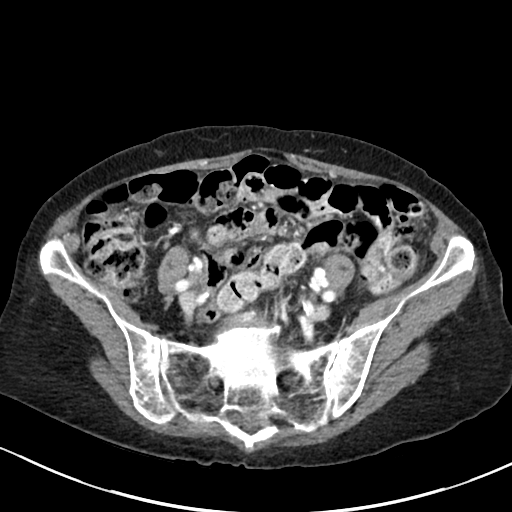
[im 41/82  soft-tissue]
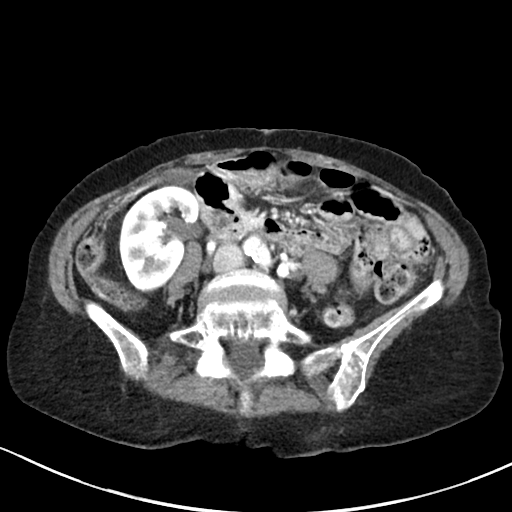
[im 48/82  soft-tissue]
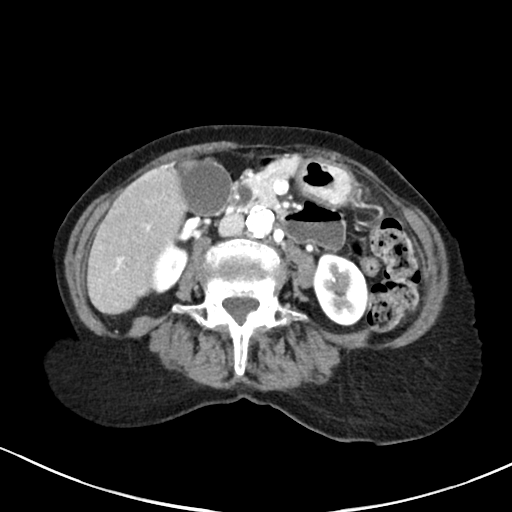
[im 55/82  soft-tissue]
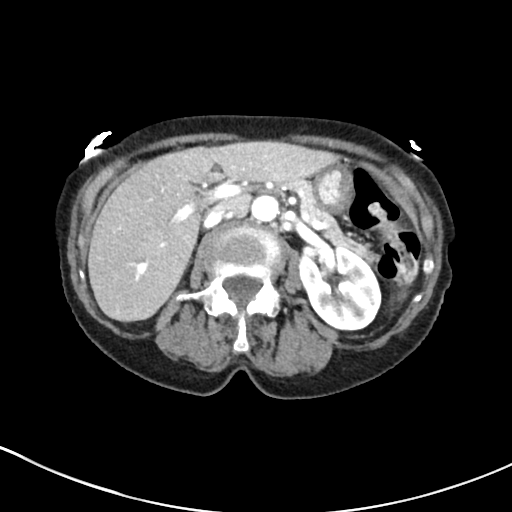
[im 61/82  soft-tissue]
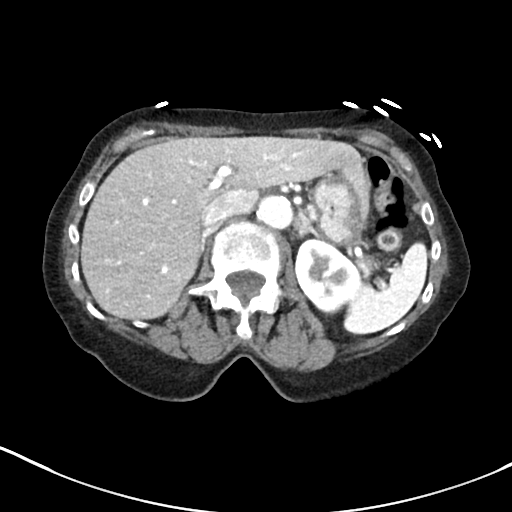
[im 61/82  bone]
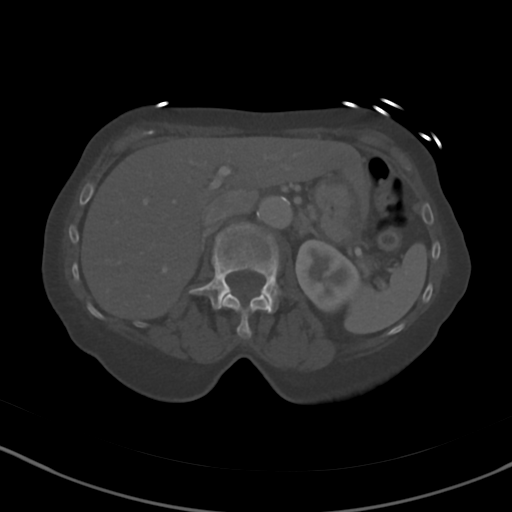
[im 68/82  soft-tissue]
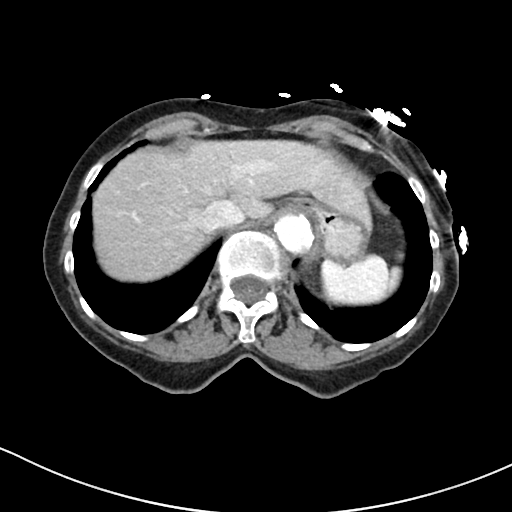
[im 75/82  soft-tissue]
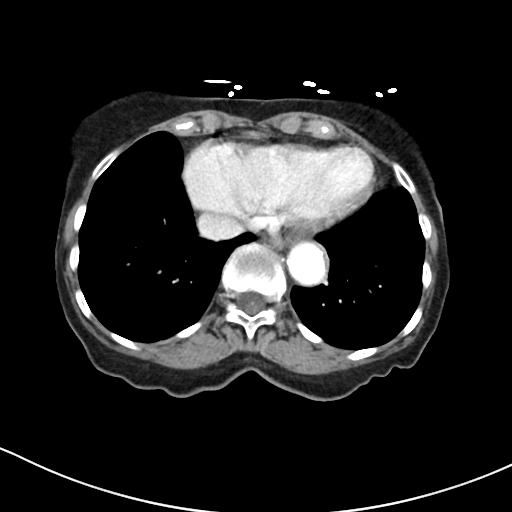

[Series 6: abdomen 3.0 mpr cor · coronal · 0.58mm/px · 3 of 75 slices shown]
[im 25/75  soft-tissue]
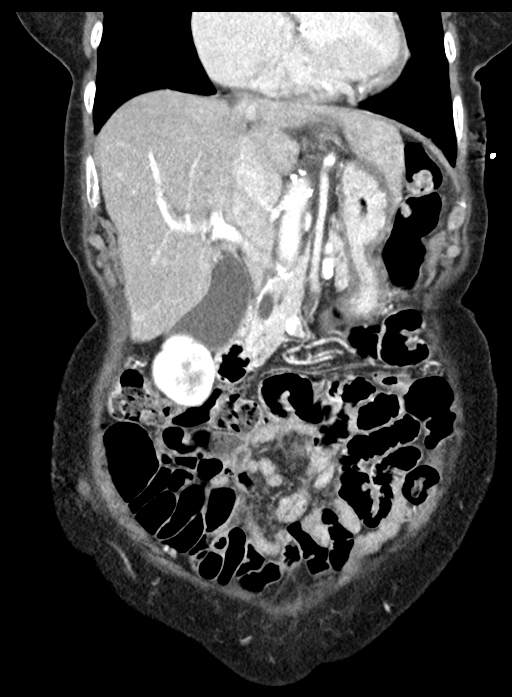
[im 33/75  soft-tissue]
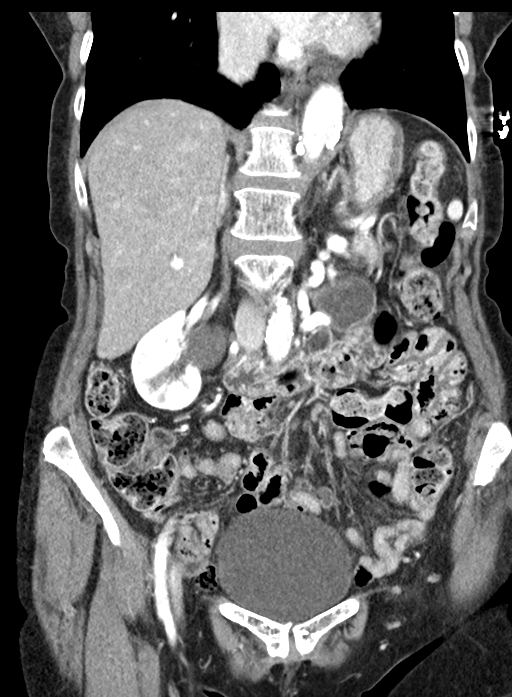
[im 42/75  soft-tissue]
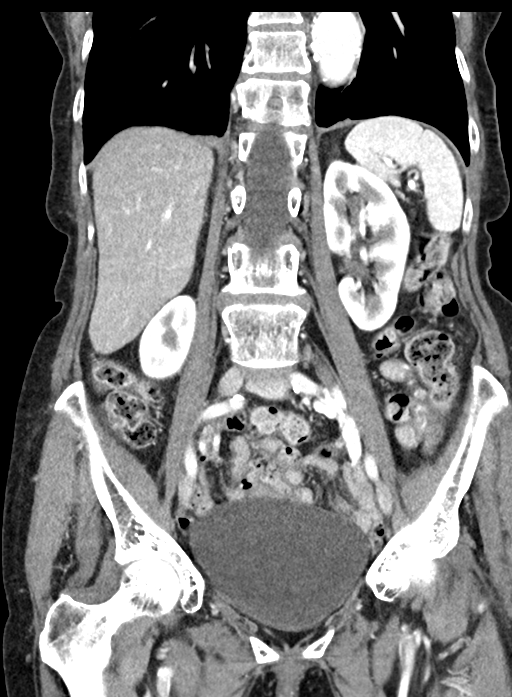

[14 of 46 positions shown; findings below may reference images not displayed]

FINDINGS: Lower chest: Stable 4 mm pleural-based nodule in left lower lobe.
Otherwise, the lung bases are clear.

Hepatobiliary: Common bile duct is mildly dilated measuring up to 8
mm. No clear evidence for an obstructing stone or lesion.
Gallbladder is mildly distended without inflammatory changes. Normal
appearance of the liver. Portal venous system is patent.

Pancreas: Minimal dilatation of the main pancreatic duct without
inflammatory changes. No suspicious pancreatic lesion.

Spleen: Normal in size without focal abnormality.

Adrenals/Urinary Tract: Normal adrenal glands. Normal appearance
both kidneys. Urinary bladder is unremarkable.

Stomach/Bowel: Wall thickening involving the sigmoid colon on
sequence 3, image 55. Wall thickening has not significantly changed
since 2762. No evidence for acute colonic inflammation. Mild wall
thickening of the stomach but the stomach is also decompressed. No
gross abnormality to the small bowel.

Vascular/Lymphatic: Atherosclerotic calcifications in the thoracic
and abdominal aorta. Main visceral arteries are patent. No
significant lymph node enlargement. Prominent bilateral ovarian
veins, left side greater than right.

Reproductive: Low-density structure involving the left adnexa has
enlarged since 2762. This structure measures 3.6 x 2.9 cm and
previously measured up to 1.6 cm. No gross abnormality to the uterus
or right adnexa.

Other: Negative for free fluid.  Negative for free air.

Musculoskeletal: Chronic disc space narrowing at L5-S1. No
suspicious bone lesions.
IMPRESSION: 1. No acute abnormality in the abdomen or pelvis.
2. Wall thickening of the sigmoid colon appears chronic and
compatible with diverticulosis. No evidence for acute colonic
inflammation
3. **An incidental finding of potential clinical significance has
been found. Enlargement of a low-density left adnexal structure.
This is probably a cystic structure. Recommend non-emergent pelvic
ultrasound to confirm that this is a simple cystic structure.**
4. Biliary dilatation without evidence for an obstructing stone,
lesion or gallbladder inflammation.
5. Nonspecific gastric wall thickening which may be related to under
distention.
6.  Aortic Atherosclerosis ([id number]).

## 2020-02-29 IMAGING — DX DG CHEST 2V
2 series · 2 of 2 positions shown · non-contrast
Comparison: Radiographs May 02, 2008.

CLINICAL DATA: Shortness of breath.

EXAM:
CHEST - 2 VIEW

[w chest pa]
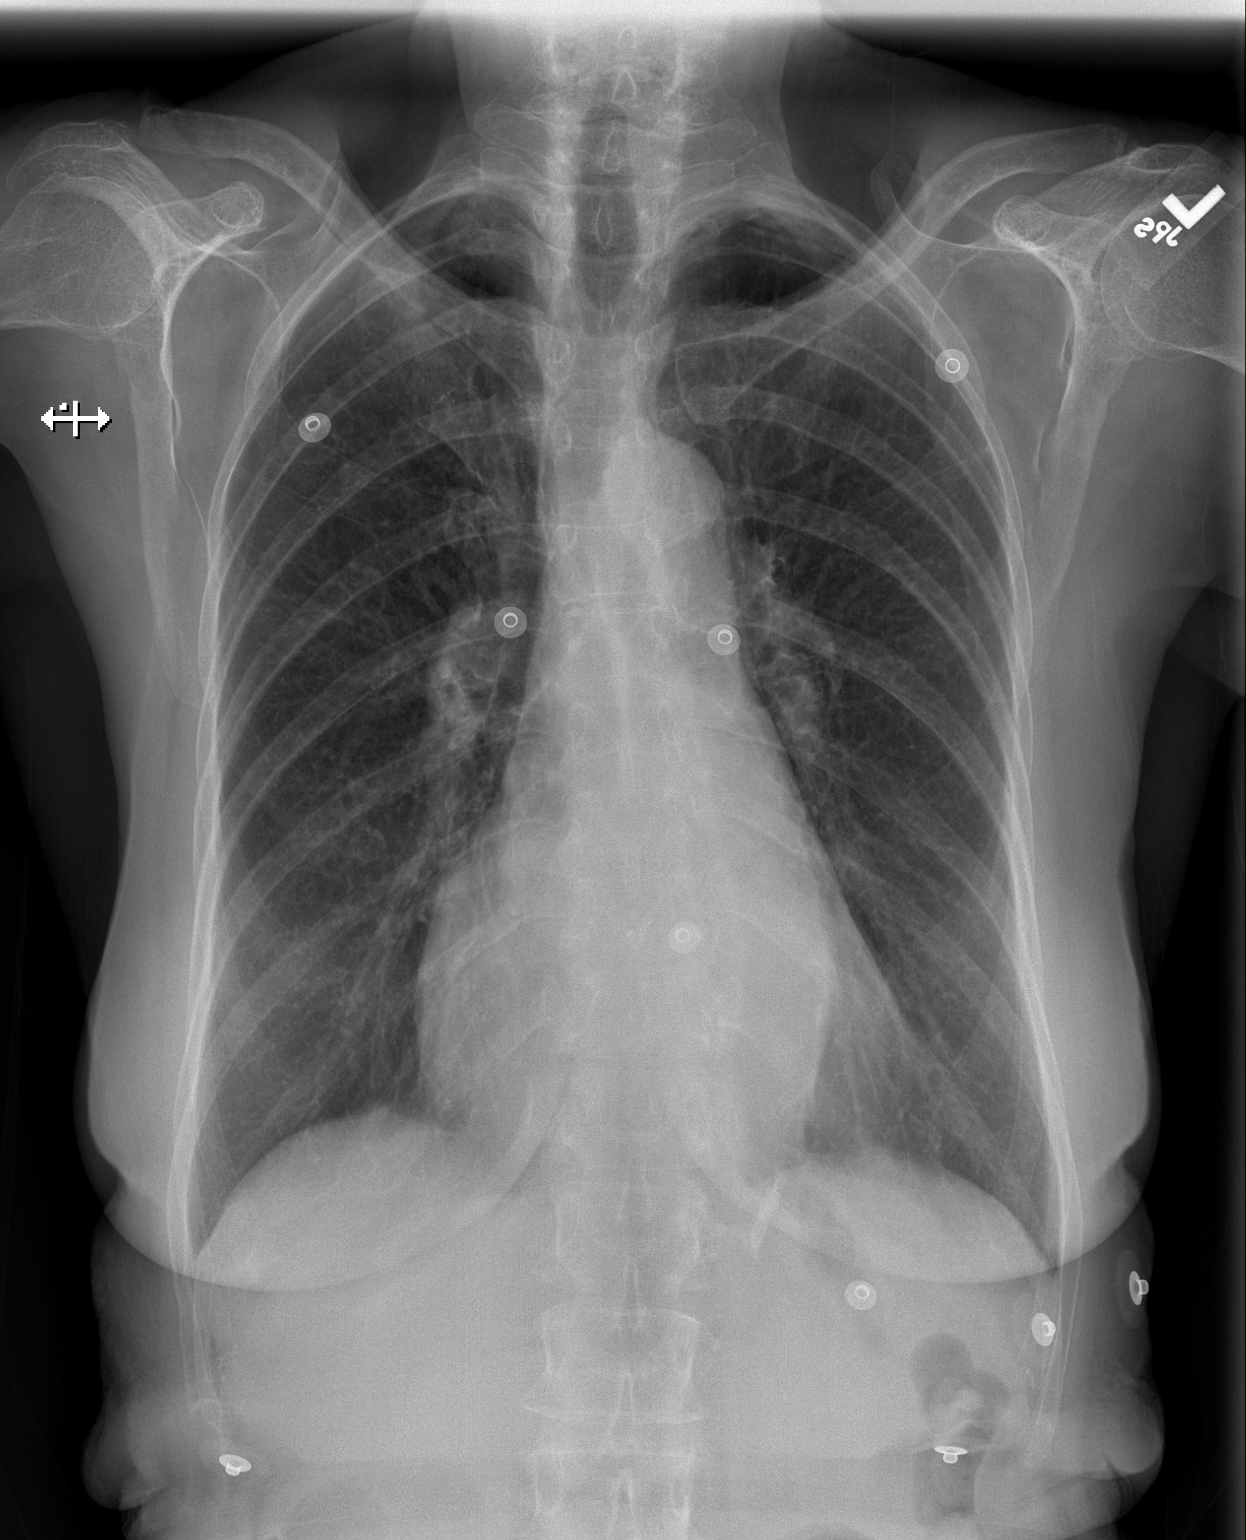

[w chest lat]
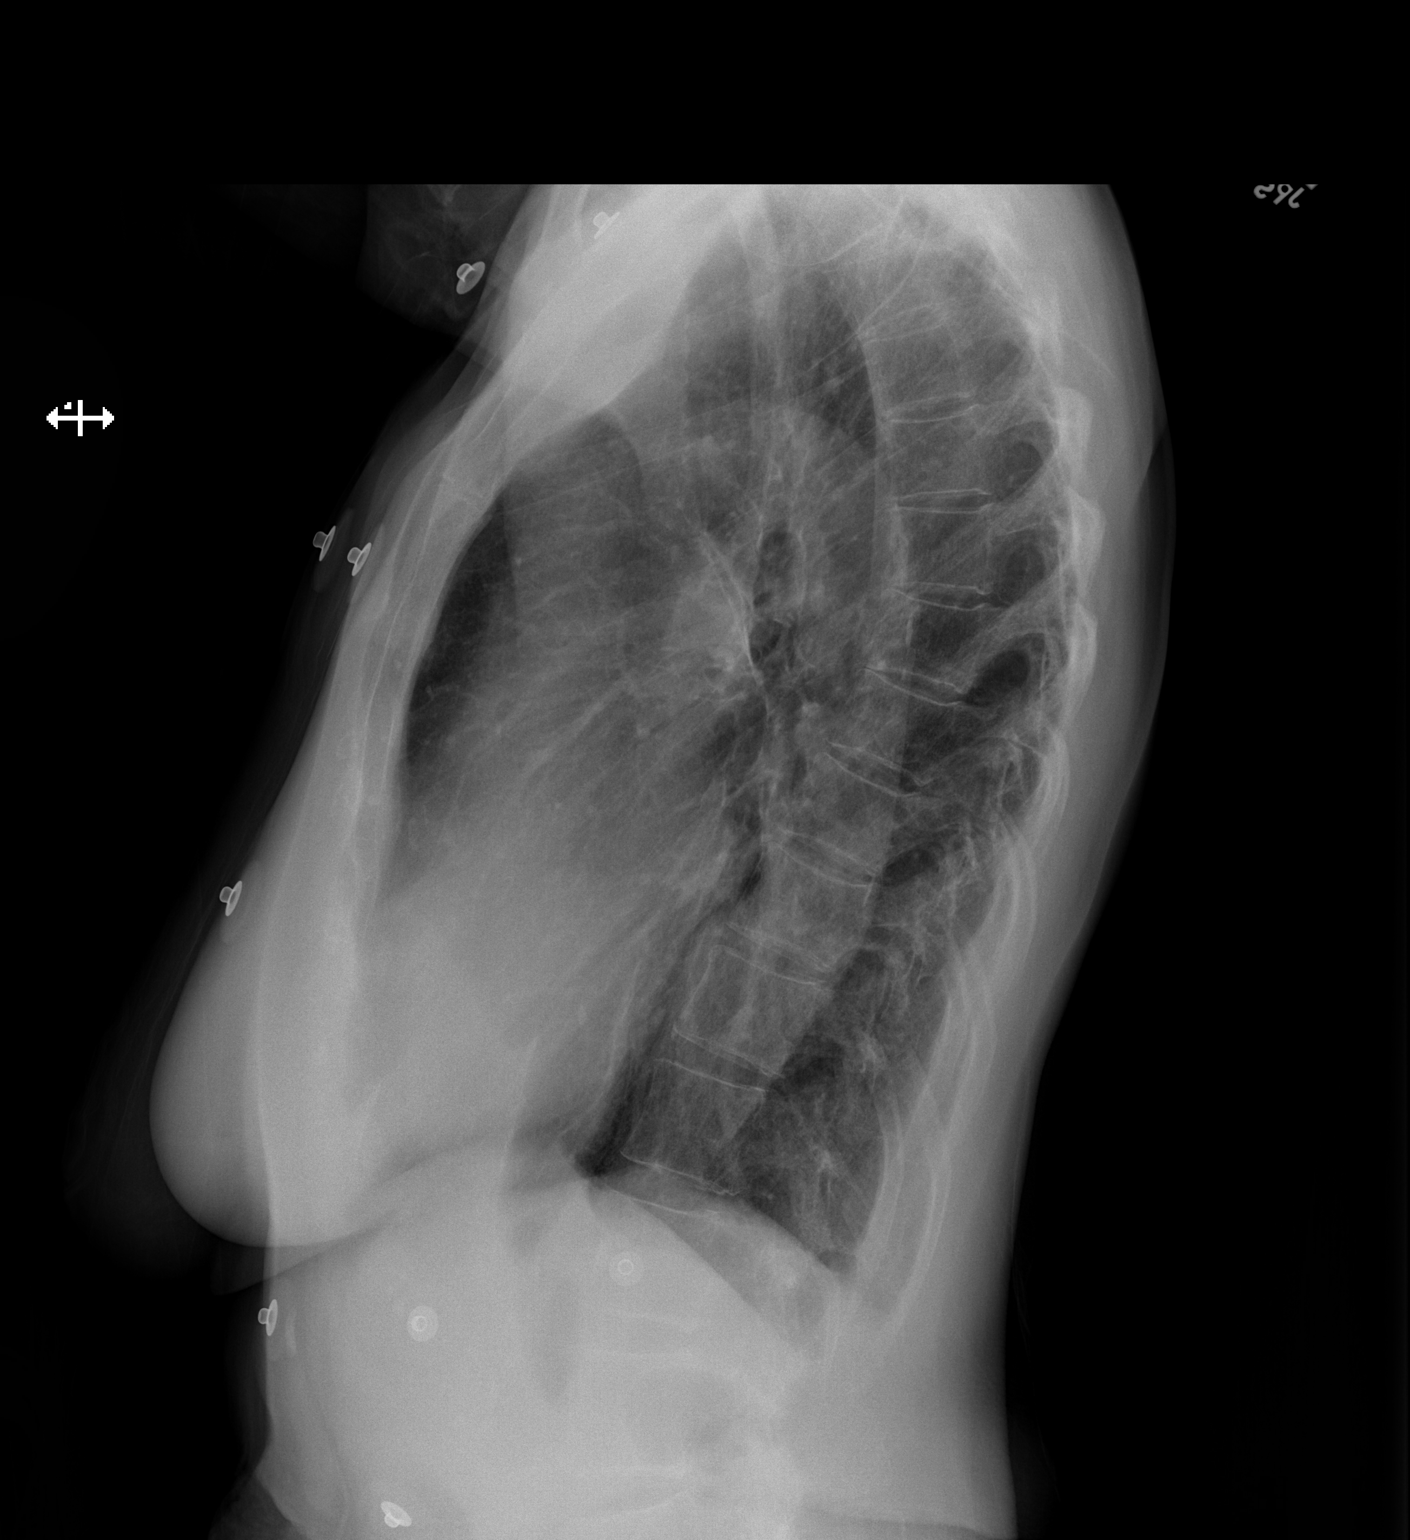

[2 of 2 positions shown; findings below may reference images not displayed]

FINDINGS: Stable cardiomediastinal silhouette. No pneumothorax or pleural
effusion is noted. Both lungs are clear. The visualized skeletal
structures are unremarkable.
IMPRESSION: No active cardiopulmonary disease.

## 2020-03-10 DIAGNOSIS — Z1389 Encounter for screening for other disorder: Secondary | ICD-10-CM | POA: Diagnosis not present

## 2020-03-10 DIAGNOSIS — I480 Paroxysmal atrial fibrillation: Secondary | ICD-10-CM | POA: Diagnosis not present

## 2020-03-10 DIAGNOSIS — Z0000 Encounter for general adult medical examination without abnormal findings: Secondary | ICD-10-CM | POA: Diagnosis not present

## 2020-03-10 DIAGNOSIS — R946 Abnormal results of thyroid function studies: Secondary | ICD-10-CM | POA: Diagnosis not present

## 2020-03-10 DIAGNOSIS — M810 Age-related osteoporosis without current pathological fracture: Secondary | ICD-10-CM | POA: Diagnosis not present

## 2020-03-10 DIAGNOSIS — E7800 Pure hypercholesterolemia, unspecified: Secondary | ICD-10-CM | POA: Diagnosis not present

## 2020-04-08 DIAGNOSIS — C4441 Basal cell carcinoma of skin of scalp and neck: Secondary | ICD-10-CM | POA: Diagnosis not present

## 2020-04-08 DIAGNOSIS — L304 Erythema intertrigo: Secondary | ICD-10-CM | POA: Diagnosis not present

## 2020-04-08 DIAGNOSIS — J029 Acute pharyngitis, unspecified: Secondary | ICD-10-CM | POA: Diagnosis not present

## 2020-04-17 DIAGNOSIS — R1032 Left lower quadrant pain: Secondary | ICD-10-CM | POA: Diagnosis not present

## 2020-04-24 DIAGNOSIS — K1370 Unspecified lesions of oral mucosa: Secondary | ICD-10-CM | POA: Diagnosis not present

## 2020-05-20 DIAGNOSIS — C4441 Basal cell carcinoma of skin of scalp and neck: Secondary | ICD-10-CM | POA: Diagnosis not present

## 2020-05-23 IMAGING — US US TRANSVAGINAL NON-OB
1 series · 13 of 25 positions shown · non-contrast
Comparison: Pelvic CT from earlier on the same day.

CLINICAL DATA: Left lower quadrant pain, no prior surgery, cyst
seen on same day CT.



[Series 1: us transvaginal non-ob · 0.20mm/px · 32 acquisitions, 13 frames shown]
[im 1/32]
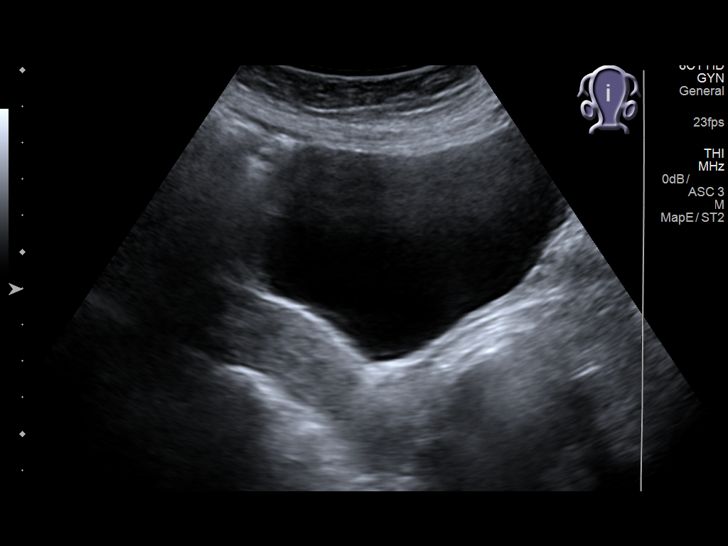
[im 3/32]
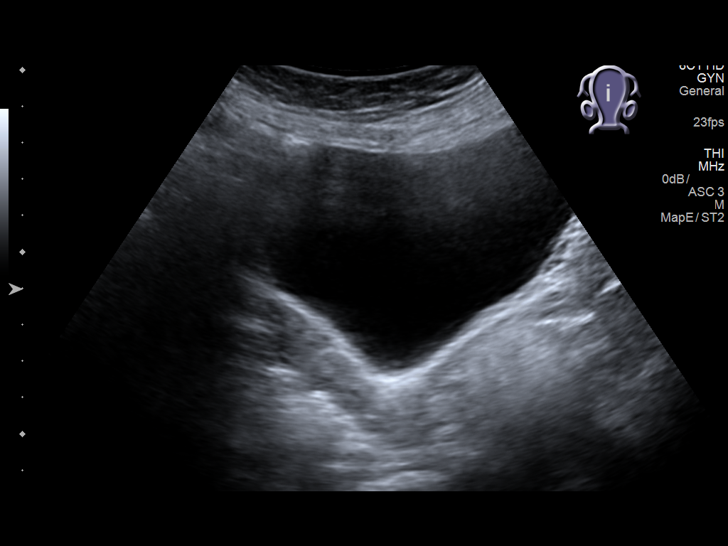
[im 6/32]
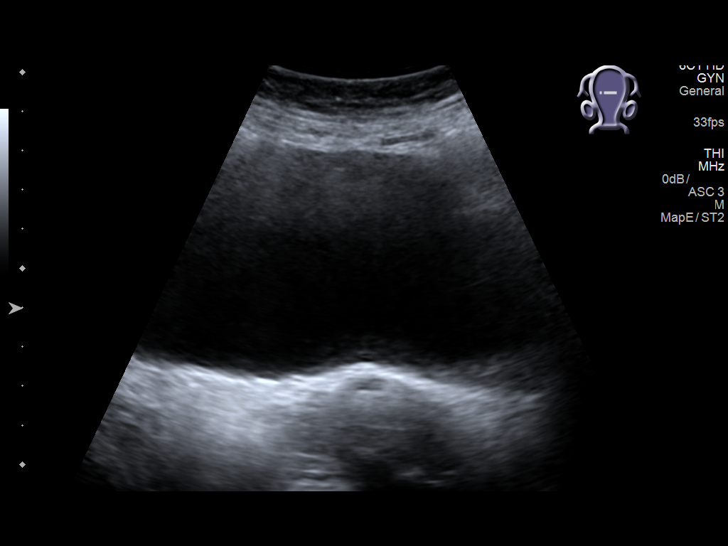
[im 8/32]
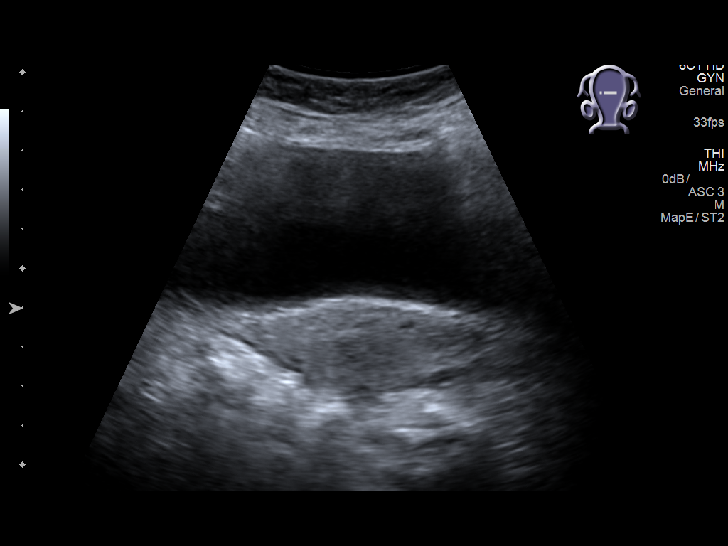
[im 11/32]
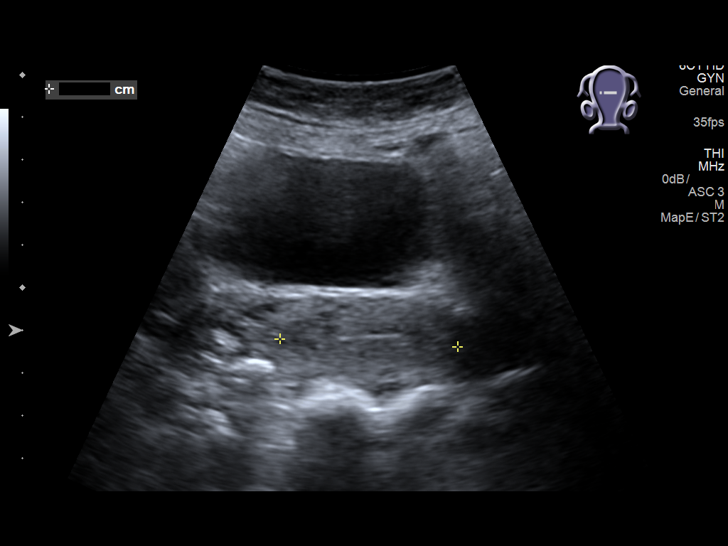
[im 13/32]
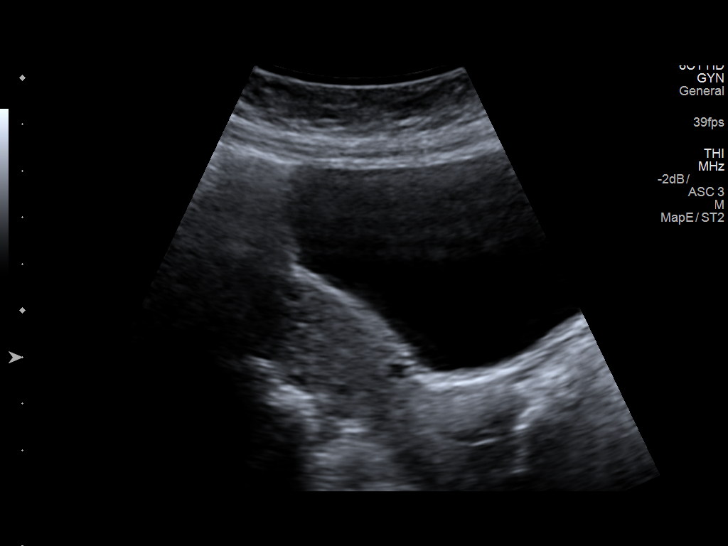
[im 16/32]
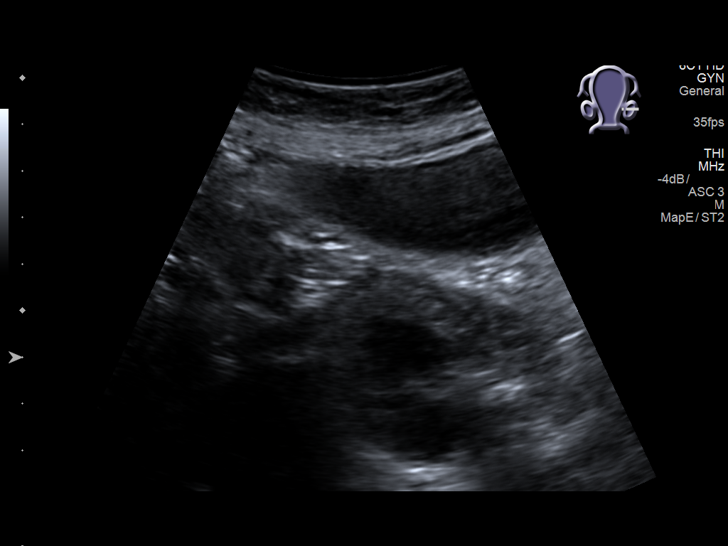
[im 19/32]
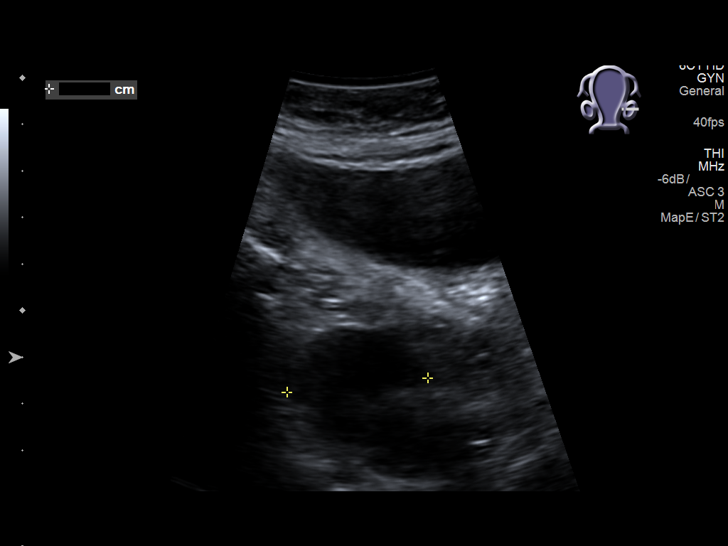
[im 21/32]
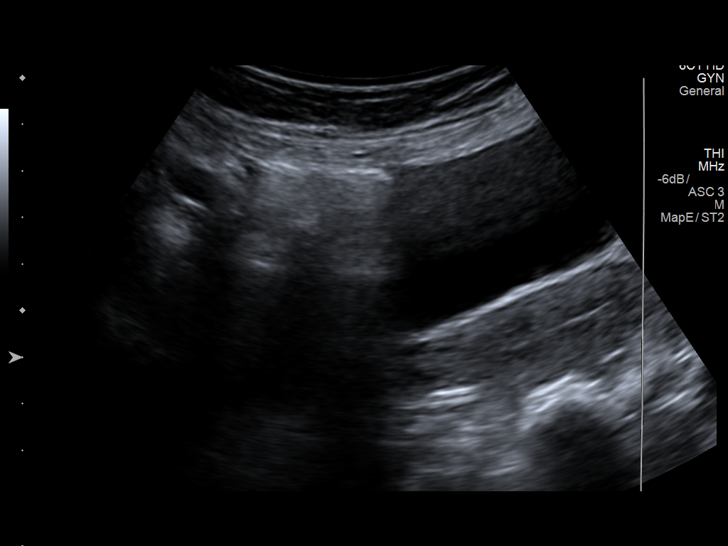
[im 24/32]
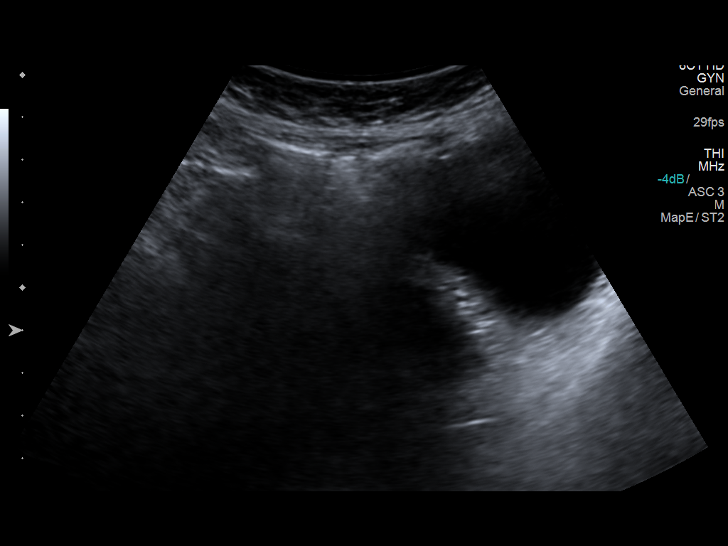
[im 26/32]
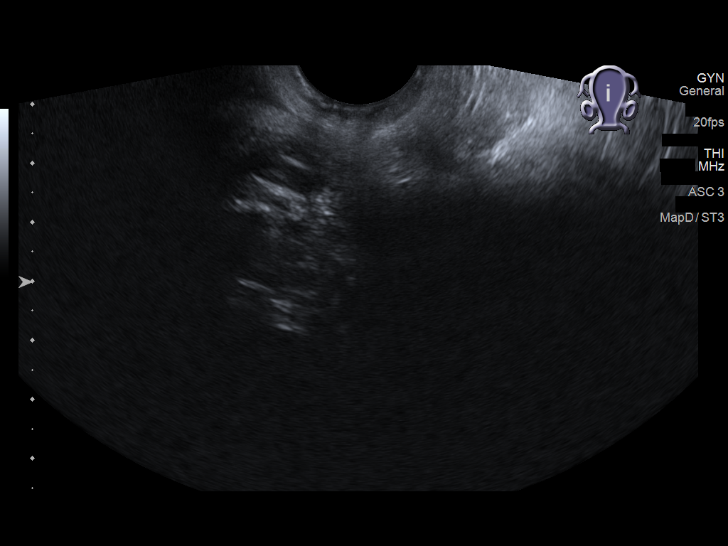
[im 29/32]
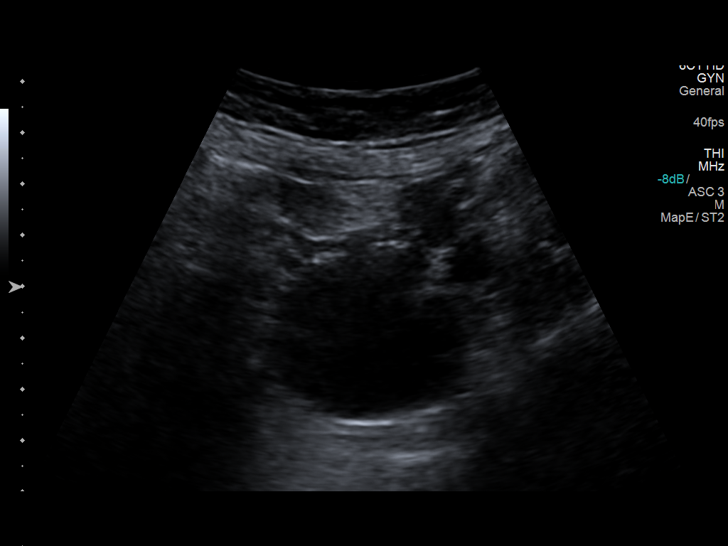
[im 32/32]
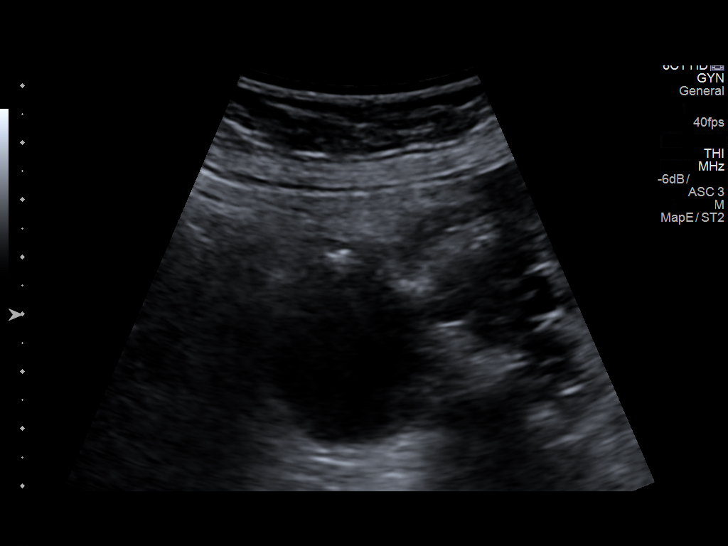

[13 of 25 positions shown; findings below may reference images not displayed]

FINDINGS: Uterus

Measurements: 7.5 x 2.1 x 4.2 cm. No fibroids or other mass
visualized.

Endometrium

Thickness: 2.1 mm.  No focal abnormality visualized.

Right ovary

Unable to be visualized due to adjacent bowel bowel gas.

Left ovary

There is an anechoic left adnexal cyst likely of ovarian etiology
measuring 3.9 x 2.9 x 3 cm. It is anechoic in appearance without
mural nodularity nor septations. What appears represent ovarian
tissue measures at least 3.6 x 4.9 cm AP by transverse dimension.
Endovaginal imaging for better characterization was attempted but
was unsuccessful due to patient pain and difficulty advancing the
probe.

Other findings

No abnormal free fluid.
IMPRESSION: Anechoic simple appearing left adnexal cyst likely of ovarian
etiology without worrisome features measuring 3.9 x 2.9 x 3 cm.
Consensus guidelines recommend annual ultrasound evaluation given
size of the cyst, postmenopausal status of the patient and without
concerning features identified.

## 2020-06-10 IMAGING — US US PELVIS COMPLETE
1 series · 13 of 13 positions shown · non-contrast
Comparison: CT of the abdomen and pelvis on 09/26/2017

CLINICAL DATA: Followup LEFT adnexal cyst seen on CT and
ultrasound.

EXAM:
TRANSABDOMINAL ULTRASOUND OF PELVIS
TECHNIQUE: Transabdominal ultrasound examination of the pelvis was performed
including evaluation of the uterus, ovaries, adnexal regions, and
pelvic cul-de-sac.

[Series 1: us pelvis complete · 0.19mm/px · 13 of 13 slices shown]
[im 1/13]
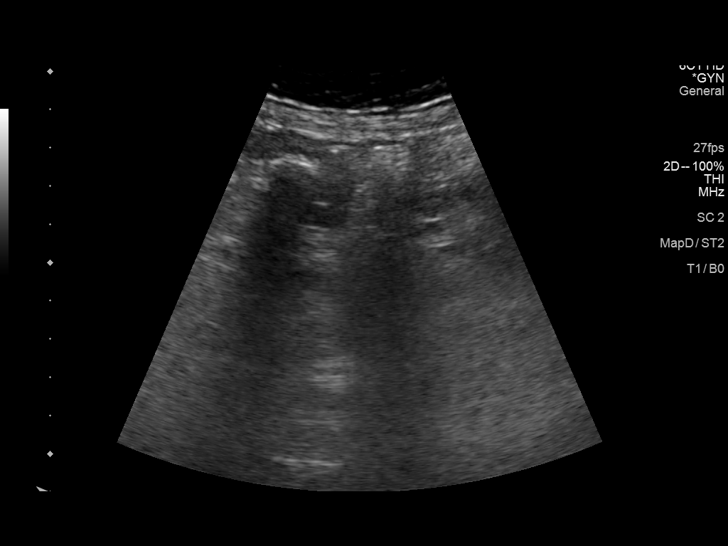
[im 2/13]
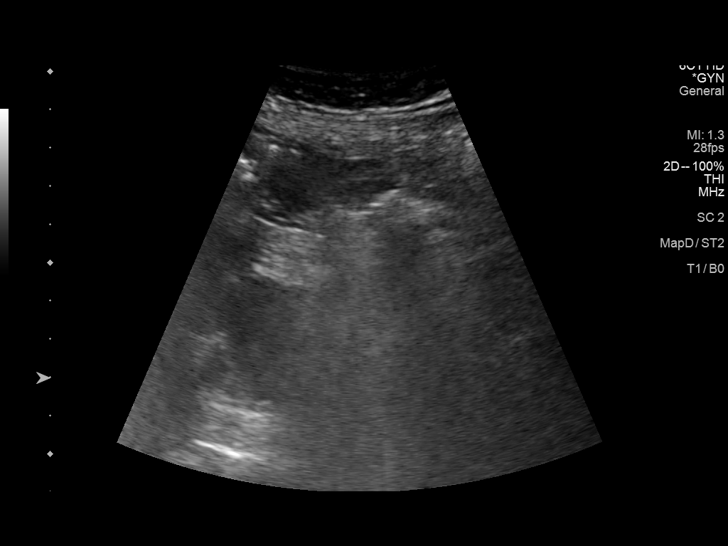
[im 3/13]
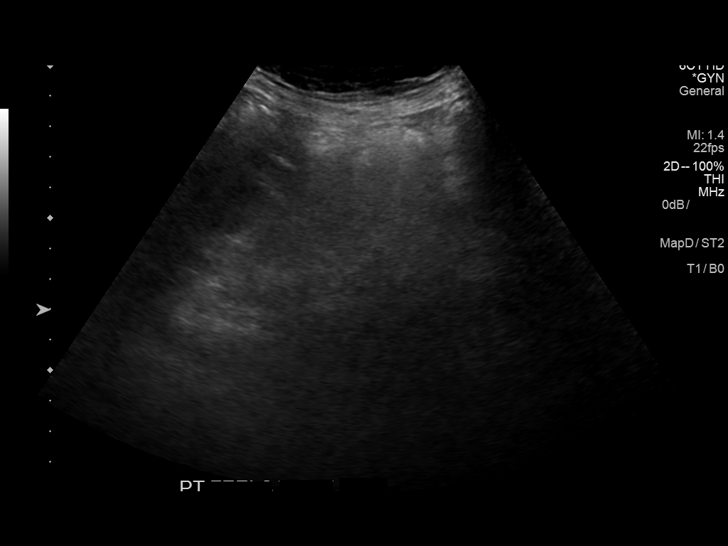
[im 4/13]
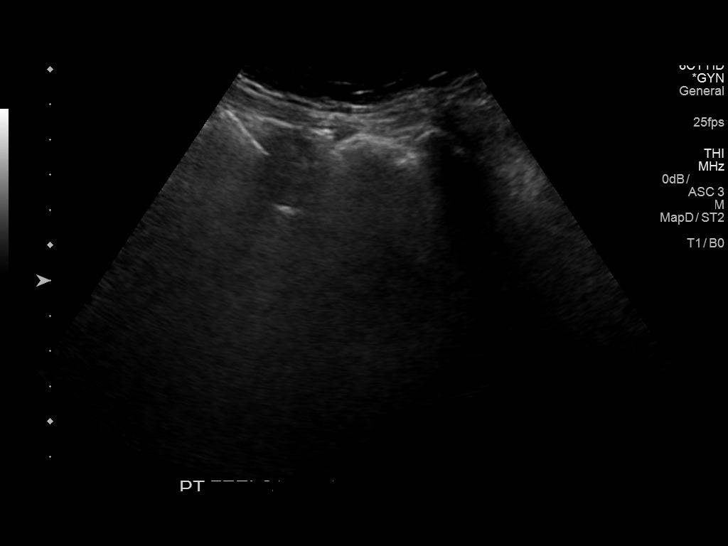
[im 5/13]
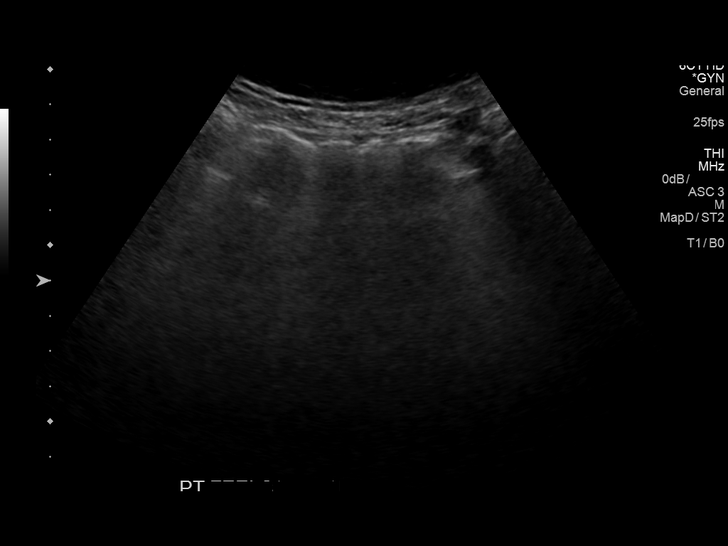
[im 6/13]
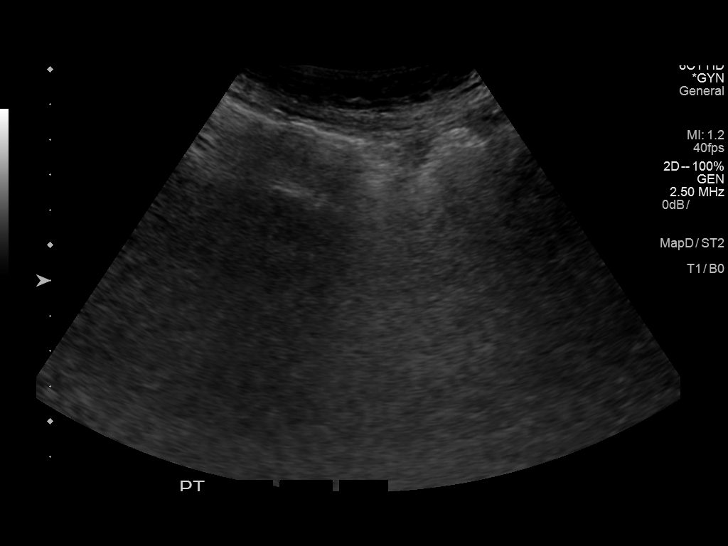
[im 7/13]
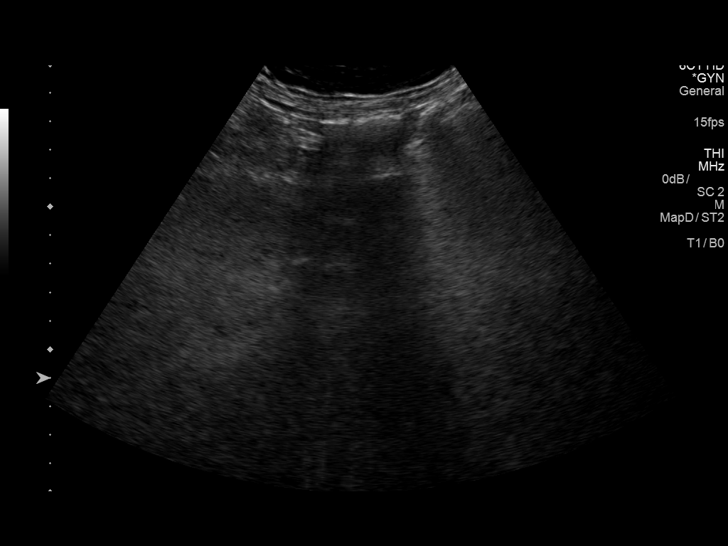
[im 8/13]
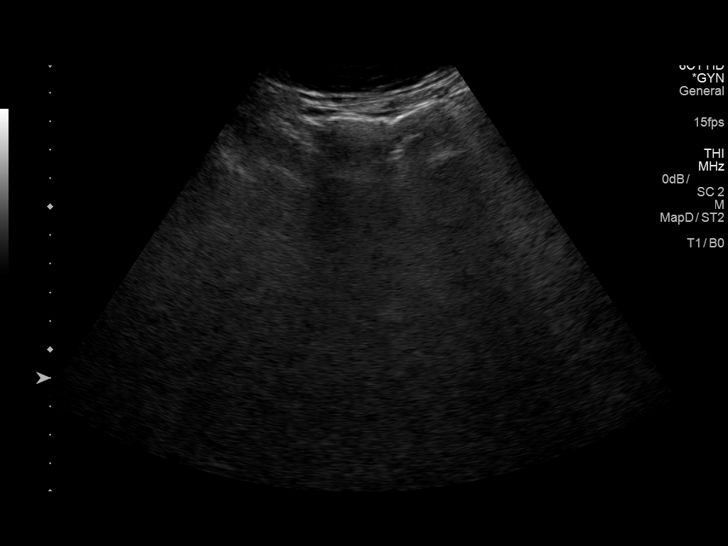
[im 9/13]
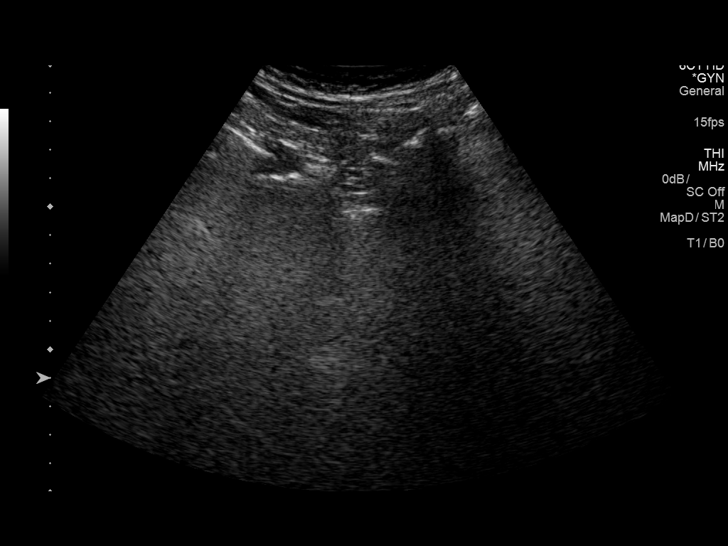
[im 10/13]
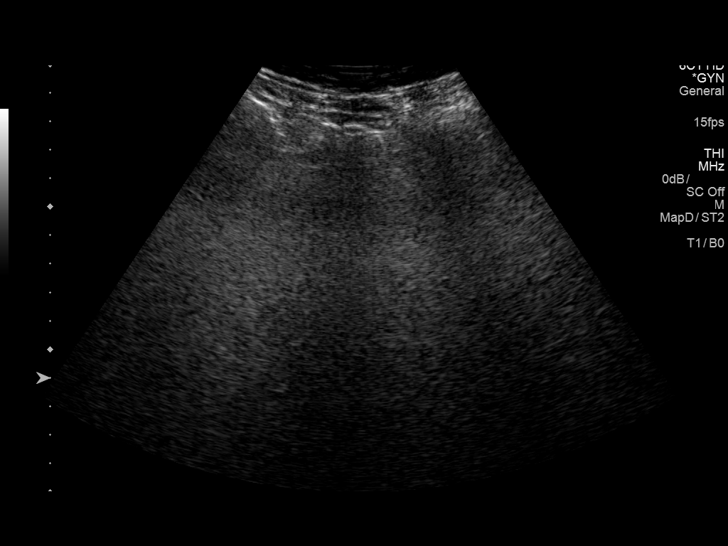
[im 11/13]
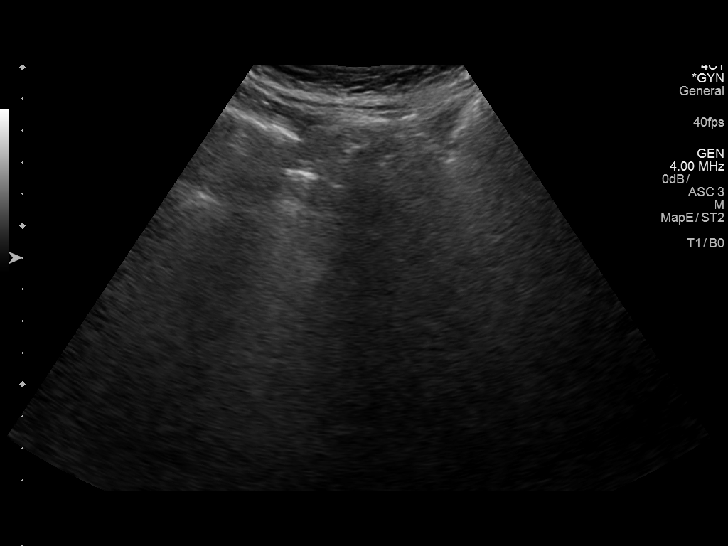
[im 12/13]
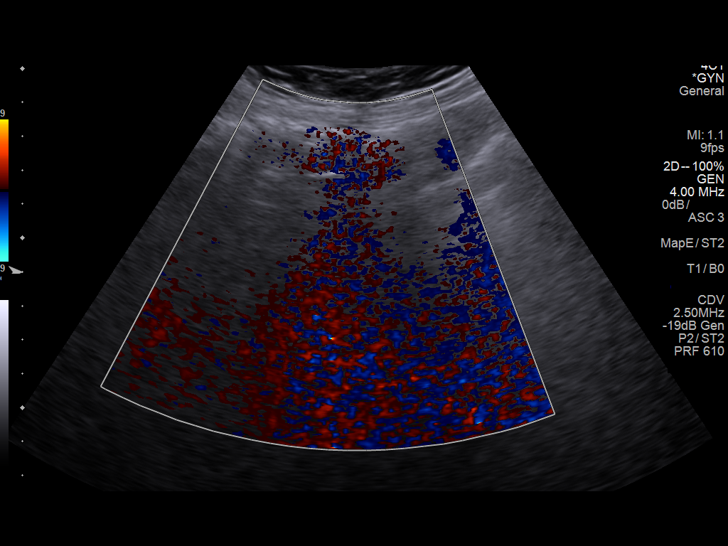
[im 13/13]
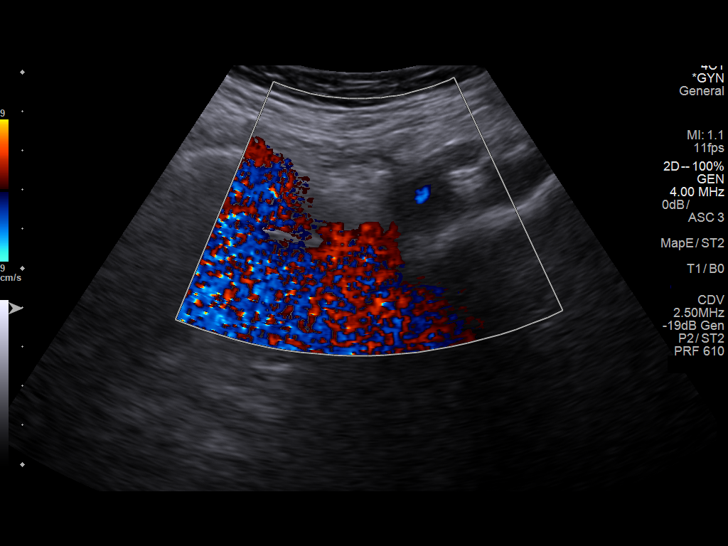

[13 of 13 positions shown; findings below may reference images not displayed]

FINDINGS: Uterus

Measurements: Not visualized on transabdominal evaluation..

Endometrium

Thickness: Not visualized.

Right ovary

Measurements: The ovary is not visualized, either absent or
obscured.

Left ovary

Measurements: The ovary is not visualized, either absent or
obscured.

Other findings: Study quality is degraded by significant overlying
bowel gas..
IMPRESSION: Nonvisualized uterus and ovaries on today's exam.

## 2020-06-24 DIAGNOSIS — Z961 Presence of intraocular lens: Secondary | ICD-10-CM | POA: Diagnosis not present

## 2020-06-24 DIAGNOSIS — H524 Presbyopia: Secondary | ICD-10-CM | POA: Diagnosis not present

## 2020-07-07 ENCOUNTER — Encounter: Payer: Self-pay | Admitting: Cardiology

## 2020-07-07 NOTE — Progress Notes (Addendum)
Primary Care Provider: Lavone Orn, MD Cardiologist: Glenetta Hew, MD Electrophysiologist: None  Clinic Note: Chief Complaint  Patient presents with   Follow-up    74-month   Mitral Regurgitation    No shortness of breath with rest or exertion, no PND, orthopnea with trivial edema.    ===================================  ASSESSMENT/PLAN   Problem List Items Addressed This Visit     PAF (paroxysmal atrial fibrillation) - CHA2DS2Vasc = 4. Low dose Eliquis - Primary (Chronic)    Thankfully, it does not sound like she has had another episode since her acute illness when it was initially diagnosed.  I would suspect that she would be quite symptomatic given her mitral prolapse, where she had to go back into A. Fib.  Plan:  Continue with 2.5 mg twice daily Eliquis for anticoagulation. Low-dose beta-blocker for rate control-unable to titrate further because of heart rate 62 bpm. Currently no plans for AAD, however if she were to have breakthrough spells, we would need to get her cardioverted quickly.       Relevant Orders   ECHOCARDIOGRAM COMPLETE   Fatigue due to treatment    Not on beta-blocker because of fatigue.  As long she is not having breakthrough spells of A. fib, were okay without rate control agent.       Hyperlipidemia LDL goal <100 (Chronic)    LDL not at goal, but she would prefer to avoid taking additional medications.  She is on 40 mg of atorvastatin.  Given her advanced age and lack of symptoms and other comorbidities I think simply continuing statin is reasonable but would not push further, which is commensurate with her wishes..       MVP (mitral valve prolapse) (Chronic)    Bileaflet MVP with moderate-severe MR on recent echo.  Due for repeat follow-up echo.  We reviewed symptoms of concern including heart failure symptoms, angina symptoms and recurrence of A. fib.  She is not having any of the symptoms.  She gets a little short of breath when she  overdoes things but not with routine activity.  Plan: Recheck 2D echo near the end of the year. Low threshold to consider referral to valve clinic-would likely refer to Dr. Burt Knack       Relevant Orders   ECHOCARDIOGRAM COMPLETE   Moderate to severe mitral regurgitation (Chronic)    For now, as long as she is not having further symptoms, continue conservative management and monitoring.  Shoot for blood pressure and rate control.  Hopefully she will not be in A. fib.  Recurrence of A. fib would be an indication to consider referral for potential mitral clip.  I have discussed her case with Dr. Burt Knack, and for now the plan is conservative management.       Relevant Orders   ECHOCARDIOGRAM COMPLETE    ===================================  HPI:    Lisa Lang is a 87 y.o. female with a PMH notable for PAROXYSMAL ATRIAL FIBRILLATION, BILEAFLET MVP w/ MODERATE-SEVERE MR who presents today for 98-month follow-up.  09/2017: Consult for A. fib RVR in setting of abdominal pain-ovarian cyst -> spontaneously converted to NSR. CHA2DS2-VASc score -> ~3 (Age 55, F 1) -> on Eliquis and metoprolol 25 mg twice daily.  Lisa Lang was last seen on 01/14/2020 to discuss results of her echocardiogram revealing stable EF of greater than 60% with normal RVP.  Stable bileaflet MVP and moderate to severe MR. ->  She indicated that she was doing well.  Smiling with  no complaints.  Maybe some mild Balance issues.  Takes longer to get things done, but otherwise no major issues.  Has a fear of falling, but has not had any falls.  Mild and today swelling.  Exercise intolerance is probably more related to her balance issues. -> Was having issues with allergies-congestion and cough/sneezing.  Recent Hospitalizations: none  Reviewed  CV studies:    The following studies were reviewed today: (if available, images/films reviewed: From Epic Chart or Care Everywhere) No recent studies:  Interval History:   Lisa Lang presents here today for follow-up indicating that she is doing relatively well.  She is still independent of her ADLs and is not having any significant dyspnea or lightheadedness, dizziness, chest pain.  She says that she just does not do well in the hot weather.  As long she does not go outside in the hot weather she feels relatively fine.  She says occasionally she will get dizzy if she stands up too quickly or stands up after bending over too fast.  But no near syncope or syncope.  Basically she says as long as she does not things at a nice slow and easy rate, she usually does fine.  When she tries to go too fast she may feel like she is losing her balance or may get more short of breath.  She is scared of walking too fast because of balance issues more so than dyspnea.  No PND, orthopnea with trivial edema.  Usually the edema is in the end of the day and goes down when she puts her feet up.  CV Review of Symptoms (Summary) Cardiovascular ROS: positive for - mild exertional dyspnea-more because she is concentrating on following. negative for - chest pain, edema, irregular heartbeat, orthopnea, palpitations, paroxysmal nocturnal dyspnea, rapid heart rate, shortness of breath, or some lightheadedness/dizziness but no syncope or near CP, TIA/amaurosis fugax or claudication  REVIEWED OF SYSTEMS   Review of Systems  Constitutional:  Negative for malaise/fatigue and weight loss.  HENT:  Negative for nosebleeds.   Respiratory:  Negative for cough and shortness of breath.   Cardiovascular:        Per HPI  Gastrointestinal:  Negative for blood in stool, constipation and melena.  Genitourinary:  Negative for hematuria.  Musculoskeletal:  Positive for joint pain. Negative for falls.  Neurological:  Positive for dizziness (Per HPI). Negative for focal weakness and weakness.  Psychiatric/Behavioral:  Negative for depression and memory loss. The patient is not nervous/anxious and does not have  insomnia.    I have reviewed and (if needed) personally updated the patient's problem list, medications, allergies, past medical and surgical history, social and family history.   PAST MEDICAL HISTORY   Past Medical History:  Diagnosis Date   Arthritis    Diverticulitis 1995   And 2007   Hyperlipidemia LDL goal <100    Mitral valve prolapse    Bileaflet MVP with moderate MR.   PAF (paroxysmal atrial fibrillation) ([place]) 11/2017   CHA2DS2 VASc score > 3 (Age >68 and female sex), --> Eliquis & Metoprolol     PAST SURGICAL HISTORY   Past Surgical History:  Procedure Laterality Date   CATARACT EXTRACTION, BILATERAL     TRANSTHORACIC ECHOCARDIOGRAM  3/'19, 9/'19   03/2017: Normal LVET (60-65%).  Gr 1 DD. Mild AI.  Mild MR with bileaflet MVP.  Mild pulm HTN; ECHO 09/27/2017: Normal LVEF w/o RWMA. Diastolic dysfxn with elevated LVEDP.  AoV sclerosis with Mod AI.  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  Severe RA dilation.     TRANSTHORACIC ECHOCARDIOGRAM  12/12/2019    EF 60 to 65%.  No R WMA.  Unable to assess diastolic pressures.  Normal RV pressures.  Mild biatrial enlargement.  Moderate aortic calcification - no AS.  Severe BI-Leaflet MVP with Mod-Severe MR    Immunization History  Administered Date(s) Administered   Influenza, High Dose Seasonal PF 10/09/2018   PFIZER(Purple Top)SARS-COV-2 Vaccination 01/25/2019, 02/15/2019    MEDICATIONS/ALLERGIES   Current Meds  Medication Sig   apixaban (ELIQUIS) 2.5 MG TABS tablet Take 1 tablet (2.5 mg total) by mouth 2 (two) times daily.   atorvastatin (LIPITOR) 40 MG tablet Take 40 mg by mouth at bedtime.   calcium carbonate (OS-CAL - DOSED IN MG OF ELEMENTAL CALCIUM) 1250 MG tablet Take 1 tablet by mouth daily with breakfast.   metoprolol tartrate (LOPRESSOR) 25 MG tablet TAKE AS INSTRUCTED BY YOUR PRESCRIBER   Vitamin D, Ergocalciferol, 2000 units CAPS Take 2,000 Units by mouth daily. 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   Now lives alone and is planning to move into a retirement community potentially.  She handled his death well, and felt somewhat liberated having the stress of being caregiver removed.    OBJCTIVE -PE, EKG, labs   Wt Readings from Last 3 Encounters:  07/08/20 113 lb (51.3 kg)  01/14/20 110 lb 12.8 oz (50.3 kg)  11/08/19 109 lb (49.4 kg)    Physical Exam:  BP 130/64 (BP Location: Right Arm, Patient Position: Sitting, Cuff Size: Normal)   Pulse 62   Ht 5\' 3"  (1.6 m)   Wt 113 lb (51.3 kg)   SpO2 96%   BMI 20.02 kg/m  Physical Exam Constitutional:      General: She is not in acute distress.    Appearance: Normal appearance. She is normal weight. She is not toxic-appearing or diaphoretic.     Comments: Remarkably well appearing for her age.  HENT:     Head: Normocephalic and atraumatic.  Cardiovascular:     Rate and Rhythm: Normal rate and regular rhythm.     Pulses: Normal pulses.     Heart sounds: S1 normal and S2 normal. A midsystolic click. Murmur heard.  High-pitched blowing holosystolic murmur is present with a grade of 1/6 at the apex radiating to the back.    No friction rub. No gallop.  Pulmonary:     Effort: Pulmonary effort is normal. No respiratory distress.     Breath sounds: Normal breath sounds. No wheezing, rhonchi or rales.  Chest:     Chest wall: No tenderness.  Musculoskeletal:        General:  Swelling (Trivial bilateral ankle edema) present. Normal range of motion.     Cervical back: Normal range of motion and neck supple.  Skin:  General: Skin is warm and dry.  Neurological:     General: No focal deficit present.     Mental Status: She is alert and oriented to person, place, and time.  Psychiatric:        Mood and Affect: Mood normal.        Behavior: Behavior normal.        Thought Content: Thought content normal.        Judgment: Judgment normal.    Adult ECG Report  Recent Labs:   March 10, 2020-TC 249, TG 177, HDL 55, LDL 162.  TSH 3.1 April 17, 2020: Hgb 14.5. Cr 0.97, K+ 4.6.     Lab Results  Component Value Date   CREATININE 0.86 12/16/2019   BUN 30 (H) 12/16/2019   NA 141 12/16/2019   K 4.2 12/16/2019   CL 106 12/16/2019   CO2 27 12/16/2019   CBC Latest Ref Rng & Units 12/16/2019 09/28/2017 09/27/2017  WBC 4.0 - 10.5 K/uL 12.3(H) 6.0 9.4  Hemoglobin 12.0 - 15.0 g/dL 13.4 12.6 14.5  Hematocrit 36.0 - 46.0 % 40.8 39.6 45.4  Platelets 150 - 400 K/uL 210 162 207    Lab Results  Component Value Date   TSH 6.839 (H) 09/27/2017    ==================================================  COVID-19 Education: The signs and symptoms of COVID-19 were discussed with the patient and how to seek care for testing (follow up with PCP or arrange E-visit).    I spent a total of 20 minutes with the patient spent in direct patient consultation.  Additional time spent with chart review  / charting (studies, outside notes, etc): 14 min Total Time: 34 min  Current medicines are reviewed at length with the patient today.  (+/- concerns) n/a  This visit occurred during the SARS-CoV-2 public health emergency.  Safety protocols were in place, including screening questions prior to the visit, additional usage of staff PPE, and extensive cleaning of exam room while observing appropriate contact time as indicated for disinfecting solutions.  Notice: This dictation was prepared  with Dragon dictation along with smaller phrase technology. Any transcriptional errors that result from this process are unintentional and may not be corrected upon review.  Patient Instructions / Medication Changes & Studies & Tests Ordered   Patient Instructions  Medication Instructions:  No changes  *If you need a refill on your cardiac medications before your next appointment, please call your pharmacy*   Lab Work: Not needed   Testing/Procedures: Will be schedule for Dec 2022- at [place] has requested that you have an echocardiogram. Echocardiography is a painless test that uses sound waves to create images of your heart. It provides your doctor with information about the size and shape of your heart and how well your heart's chambers and valves are working. This procedure takes approximately one hour. There are no restrictions for this procedure.    Follow-Up: At [facility], you and your health needs are our priority.  As part of our continuing mission to provide you with exceptional heart care, we have created designated Provider Care Teams.  These Care Teams include your primary Cardiologist (physician) and Advanced Practice Providers (APPs -  Physician Assistants and Nurse Practitioners) who all work together to provide you with the care you need, when you need it.  We recommend signing up for the patient portal called "MyChart".  Sign up information is provided on this After Visit Summary.  MyChart is used to connect with  patients for Virtual Visits (Telemedicine).  Patients are able to view lab/test results, encounter notes, upcoming appointments, etc.  Non-urgent messages can be sent to your provider as well.   To learn more about what you can do with MyChart, go to [email or web address].    Your next appointment:   6 month(s)  The format for your next appointment:   In Person  Provider:   Glenetta Hew, MD   Other  Instructions Watch for any of these signs - worsening Shortness of breath  - especially walking or during activity Chest pain  Heart racing  Shortness of breath while laying down   Studies Ordered:   Orders Placed This Encounter  Procedures   ECHOCARDIOGRAM COMPLETE      Glenetta Hew, M.D., M.S. Interventional Cardiologist   Pager # [phone] Phone # [phone] [address] [place], [place] [address]   Thank you for choosing Heartcare at [facility]!!

## 2020-07-08 ENCOUNTER — Ambulatory Visit (INDEPENDENT_AMBULATORY_CARE_PROVIDER_SITE_OTHER): Payer: Medicare Other | Admitting: Cardiology

## 2020-07-08 ENCOUNTER — Encounter: Payer: Self-pay | Admitting: Cardiology

## 2020-07-08 ENCOUNTER — Other Ambulatory Visit: Payer: Self-pay

## 2020-07-08 VITALS — BP 130/64 | HR 62 | Ht 63.0 in | Wt 113.0 lb

## 2020-07-08 DIAGNOSIS — I340 Nonrheumatic mitral (valve) insufficiency: Secondary | ICD-10-CM

## 2020-07-08 DIAGNOSIS — I480 Paroxysmal atrial fibrillation: Secondary | ICD-10-CM

## 2020-07-08 DIAGNOSIS — I341 Nonrheumatic mitral (valve) prolapse: Secondary | ICD-10-CM | POA: Diagnosis not present

## 2020-07-08 DIAGNOSIS — R5383 Other fatigue: Secondary | ICD-10-CM | POA: Diagnosis not present

## 2020-07-08 DIAGNOSIS — E785 Hyperlipidemia, unspecified: Secondary | ICD-10-CM | POA: Diagnosis not present

## 2020-07-08 NOTE — Patient Instructions (Signed)
Medication Instructions:  No changes  *If you need a refill on your cardiac medications before your next appointment, please call your pharmacy*   Lab Work: Not needed   Testing/Procedures: Will be schedule for Dec 2022- at [place] has requested that you have an echocardiogram. Echocardiography is a painless test that uses sound waves to create images of your heart. It provides your doctor with information about the size and shape of your heart and how well your heart's chambers and valves are working. This procedure takes approximately one hour. There are no restrictions for this procedure.    Follow-Up: At [facility], you and your health needs are our priority.  As part of our continuing mission to provide you with exceptional heart care, we have created designated Provider Care Teams.  These Care Teams include your primary Cardiologist (physician) and Advanced Practice Providers (APPs -  Physician Assistants and Nurse Practitioners) who all work together to provide you with the care you need, when you need it.  We recommend signing up for the patient portal called "MyChart".  Sign up information is provided on this After Visit Summary.  MyChart is used to connect with patients for Virtual Visits (Telemedicine).  Patients are able to view lab/test results, encounter notes, upcoming appointments, etc.  Non-urgent messages can be sent to your provider as well.   To learn more about what you can do with MyChart, go to [email or web address].    Your next appointment:   6 month(s)  The format for your next appointment:   In Person  Provider:   Glenetta Hew, MD   Other Instructions Watch for any of these signs - worsening Shortness of breath  - especially walking or during activity Chest pain  Heart racing  Shortness of breath while laying down

## 2020-07-12 ENCOUNTER — Encounter: Payer: Self-pay | Admitting: Cardiology

## 2020-07-12 NOTE — Assessment & Plan Note (Signed)
For now, as long as she is not having further symptoms, continue conservative management and monitoring.  Shoot for blood pressure and rate control.  Hopefully she will not be in A. fib.  Recurrence of A. fib would be an indication to consider referral for potential mitral clip.  I have discussed her case with Dr. Burt Knack, and for now the plan is conservative management.

## 2020-07-12 NOTE — Assessment & Plan Note (Signed)
Thankfully, it does not sound like she has had another episode since her acute illness when it was initially diagnosed.  I would suspect that she would be quite symptomatic given her mitral prolapse, where she had to go back into A. Fib.  Plan:   Continue with 2.5 mg twice daily Eliquis for anticoagulation.  Low-dose beta-blocker for rate control-unable to titrate further because of heart rate 62 bpm.  Currently no plans for AAD, however if she were to have breakthrough spells, we would need to get her cardioverted quickly.

## 2020-07-12 NOTE — Assessment & Plan Note (Signed)
Not on beta-blocker because of fatigue.  As long she is not having breakthrough spells of A. fib, were okay without rate control agent.

## 2020-07-12 NOTE — Assessment & Plan Note (Signed)
Bileaflet MVP with moderate-severe MR on recent echo.  Due for repeat follow-up echo.  We reviewed symptoms of concern including heart failure symptoms, angina symptoms and recurrence of A. fib.  She is not having any of the symptoms.  She gets a little short of breath when she overdoes things but not with routine activity.  Plan: Recheck 2D echo near the end of the year. Low threshold to consider referral to valve clinic-would likely refer to Dr. Burt Knack

## 2020-07-12 NOTE — Assessment & Plan Note (Addendum)
LDL not at goal, but she would prefer to avoid taking additional medications.  She is on 40 mg of atorvastatin.  Given her advanced age and lack of symptoms and other comorbidities I think simply continuing statin is reasonable but would not push further, which is commensurate with her wishes.[name] Kitchen

## 2020-08-04 DIAGNOSIS — R1032 Left lower quadrant pain: Secondary | ICD-10-CM | POA: Diagnosis not present

## 2020-08-05 DIAGNOSIS — R1032 Left lower quadrant pain: Secondary | ICD-10-CM | POA: Diagnosis not present

## 2020-09-16 DIAGNOSIS — L820 Inflamed seborrheic keratosis: Secondary | ICD-10-CM | POA: Diagnosis not present

## 2020-09-16 DIAGNOSIS — C4442 Squamous cell carcinoma of skin of scalp and neck: Secondary | ICD-10-CM | POA: Diagnosis not present

## 2020-09-16 DIAGNOSIS — Z08 Encounter for follow-up examination after completed treatment for malignant neoplasm: Secondary | ICD-10-CM | POA: Diagnosis not present

## 2020-09-16 DIAGNOSIS — Z85828 Personal history of other malignant neoplasm of skin: Secondary | ICD-10-CM | POA: Diagnosis not present

## 2020-10-28 DIAGNOSIS — Z85828 Personal history of other malignant neoplasm of skin: Secondary | ICD-10-CM | POA: Diagnosis not present

## 2020-10-28 DIAGNOSIS — Z08 Encounter for follow-up examination after completed treatment for malignant neoplasm: Secondary | ICD-10-CM | POA: Diagnosis not present

## 2020-12-09 DIAGNOSIS — Z85828 Personal history of other malignant neoplasm of skin: Secondary | ICD-10-CM | POA: Diagnosis not present

## 2020-12-09 DIAGNOSIS — Z08 Encounter for follow-up examination after completed treatment for malignant neoplasm: Secondary | ICD-10-CM | POA: Diagnosis not present

## 2020-12-11 ENCOUNTER — Other Ambulatory Visit: Payer: Self-pay | Admitting: Cardiology

## 2020-12-11 NOTE — Telephone Encounter (Signed)
Prescription refill request for Eliquis received. Indication:Afib Last office visit:7/22 Scr:0.8 Age: 87 Weight:51.3 kg  Prescription refilled

## 2020-12-12 ENCOUNTER — Telehealth: Payer: Self-pay | Admitting: Cardiology

## 2020-12-12 ENCOUNTER — Other Ambulatory Visit: Payer: Self-pay

## 2020-12-12 MED ORDER — APIXABAN 2.5 MG PO TABS: 2.5000 mg | ORAL_TABLET | Freq: Two times a day (BID) | ORAL | 0 refills | Status: DC

## 2020-12-12 NOTE — Telephone Encounter (Signed)
See refill request below for Eliquis 2.5 mg

## 2020-12-12 NOTE — Telephone Encounter (Signed)
*  STAT* If patient is at the pharmacy, call can be transferred to refill team.   1. Which medications need to be refilled? (please list name of each medication and dose if known) apixaban (ELIQUIS) 2.5 MG TABS tablet  2. Which pharmacy/location (including street and city if local pharmacy) is medication to be sent to? [id number] - [place], [place] [address] AT [place]  3. Do they need a 30 day or 90 day supply? 14 day supply  Patient is out of medication

## 2020-12-19 ENCOUNTER — Other Ambulatory Visit: Payer: Self-pay

## 2020-12-19 ENCOUNTER — Telehealth: Payer: Self-pay | Admitting: Cardiology

## 2020-12-19 MED ORDER — APIXABAN 2.5 MG PO TABS: 2.5000 mg | ORAL_TABLET | Freq: Two times a day (BID) | ORAL | 1 refills | Status: DC

## 2020-12-19 NOTE — Telephone Encounter (Signed)
°*  STAT* If patient is at the pharmacy, call can be transferred to refill team.   1. Which medications need to be refilled? (please list name of each medication and dose if known)  apixaban (ELIQUIS) 2.5 MG TABS tablet  2. Which pharmacy/location (including street and city if local pharmacy) is medication to be sent to? [id number] - [place], [place] [address] AT [place]  3. Do they need a 30 day or 90 day supply? Needs an emergency supply [facility] is completely out. Requesting 7 day supply.     *STAT* If patient is at the pharmacy, call can be transferred to refill team.   1. Which medications need to be refilled? (please list name of each medication and dose if known)  metoprolol tartrate (LOPRESSOR) 25 MG tablet  2. Which pharmacy/location (including street and city if local pharmacy) is medication to be sent to? [facility] [place], [place]  3. Do they need a 30 day or 90 day supply? 90 day supply   Only has 11 tablets left.

## 2020-12-22 ENCOUNTER — Other Ambulatory Visit: Payer: Self-pay

## 2020-12-22 ENCOUNTER — Ambulatory Visit (HOSPITAL_COMMUNITY): Payer: Medicare Other | Attending: Cardiology

## 2020-12-22 DIAGNOSIS — I341 Nonrheumatic mitral (valve) prolapse: Secondary | ICD-10-CM | POA: Insufficient documentation

## 2020-12-22 DIAGNOSIS — I480 Paroxysmal atrial fibrillation: Secondary | ICD-10-CM | POA: Insufficient documentation

## 2020-12-22 DIAGNOSIS — I340 Nonrheumatic mitral (valve) insufficiency: Secondary | ICD-10-CM | POA: Insufficient documentation

## 2020-12-22 LAB — ECHOCARDIOGRAM COMPLETE
Area-P 1/2: 2.47 cm2
MV M vel: 4.64 m/s
MV Peak grad: 86.1 mmHg
P 1/2 time: 546 msec
Radius: 0.7 cm
S' Lateral: 2.3 cm

## 2020-12-24 ENCOUNTER — Other Ambulatory Visit: Payer: Self-pay | Admitting: Cardiology

## 2021-01-13 ENCOUNTER — Other Ambulatory Visit: Payer: Self-pay

## 2021-01-13 ENCOUNTER — Ambulatory Visit (INDEPENDENT_AMBULATORY_CARE_PROVIDER_SITE_OTHER): Payer: Medicare Other | Admitting: Cardiology

## 2021-01-13 ENCOUNTER — Encounter: Payer: Self-pay | Admitting: Cardiology

## 2021-01-13 VITALS — BP 148/82 | HR 65 | Ht 62.0 in | Wt 112.8 lb

## 2021-01-13 DIAGNOSIS — I341 Nonrheumatic mitral (valve) prolapse: Secondary | ICD-10-CM

## 2021-01-13 DIAGNOSIS — I480 Paroxysmal atrial fibrillation: Secondary | ICD-10-CM

## 2021-01-13 DIAGNOSIS — I340 Nonrheumatic mitral (valve) insufficiency: Secondary | ICD-10-CM | POA: Diagnosis not present

## 2021-01-13 DIAGNOSIS — E785 Hyperlipidemia, unspecified: Secondary | ICD-10-CM

## 2021-01-13 DIAGNOSIS — R5383 Other fatigue: Secondary | ICD-10-CM | POA: Diagnosis not present

## 2021-01-13 NOTE — Progress Notes (Addendum)
Primary Care Provider: Lavone Orn, MD Cardiologist: Glenetta Hew, MD Electrophysiologist: None  Clinic Note: Chief Complaint  Patient presents with   Follow-up    6 months-echo result   Atrial Fibrillation    Thankfully, no breakthrough   Mitral Valve Prolapse    With moderate-severe MR.  No significant symptoms.  (Echo results reviewed)    ===================================  ASSESSMENT/PLAN   Problem List Items Addressed This Visit       Cardiology Problems   PAF (paroxysmal atrial fibrillation) - CHA2DS2Vasc = 4. Low dose Eliquis (Chronic)    Paroxysmal Atrial Fibrillation NSR today. -Continue Eliquis and Metoprolol  Thankfully, she is maintaining sinus rhythm.  As such, she is not overly symptomatic with MVP/MR.  I fear that if she were to go into A. fib RVR, she could be pregnant symptomatic and in which case may need intervention. She would be a relatively urgent cardioversion, and if she has recurrence would warrant antiarrhythmic.      Relevant Orders   ECHOCARDIOGRAM COMPLETE   Hyperlipidemia LDL goal <100 (Chronic)    Hyperlipidemia LDL 162 in 03/2020. On Atorvastatin 40 mg.  -Continue statin -Almost due for repeat lipid panel  She is not always excited about taking additional medications.  She is tolerating atorvastatin, but not all that excited about taking additional medications.  Not unreasonable given her advanced age to avoid pushing further.      MVP (mitral valve prolapse) (Chronic)    Associated with significant mild regurgitation.  Currently asymptomatic with normal EF and wall motion.  LV size still acceptable  -We will ask Dr. Burt Knack and structural heart team to review      Relevant Orders   ECHOCARDIOGRAM COMPLETE   Moderate to severe mitral regurgitation - Primary (Chronic)    Bileaflet MVP with Moderate to Severe MR  Echo stable since Dec 2021. Patient is asymptomatic but has had atrial fibrillation in the past. Long-discussion  today regarding whether patient would want intervention. She states that she would want intervention should she become symptomatic.  -Monitor symptoms  -Return in 6 months -Repeat Echo in 1 year    I was present on her desires.  I think she would be willing to u ndergo MitraClip, probably would not want open mitral valve surgery. ->  I will forward her chart to Dr. Burt Knack evaluate echocardiogram-the question will be with this PM to be treated with MitraClip.  Low threshold to consider referral for baseline assessment & agreeable to this. 1 major contributing factor could be if she goes into A. fib or not.      Relevant Orders   EKG 12-Lead (Completed)   ECHOCARDIOGRAM COMPLETE     Other   Fatigue due to treatment    She was really intolerant of beta-blocker, as such, she is only to use beta-blocker PRN.  Thankfully, her rate is well controlled without beta-blockade.       ===================================  HPI:    Lisa Lang is a 88 y.o. female with a PMH below who presents today for follow up on moderate to severe mitral vegetation with severe prolapse of both leaflets.     Lisa Lang was last seen on 07/08/20  Recent Hospitalizations: None  Reviewed  CV studies:    The following studies were reviewed today: (if available, images/films reviewed: From Epic Chart or Care Everywhere) Echo 12/22/20: EF 60-65, grade 2 DD. mild left and right atrial dilation. Moderate to severe mitral vegetation with severe prolapse of  both lefeliets, without sig change compared to last year.    Interval History:   Lisa Lang presents today as stabilized he has been, denies any major cardiac issues.  Only occasional dyspnea.  Thankfully no signs or symptoms of recurrent A. fib.  Patient had repeat Echo on 12/22/20 with EF 60-65, grade 2 DD. mild left and right atrial dilation. Moderate to severe mitral vegetation with severe prolapse of both lefeliets, without significant change  compared to last year.   Since the last visit, she has not had any chest pain, palpitations, edema, rapid heart rate, PND, orthopnea, dizziness. She does intermittently get shortness of breath with exertion but this does not limit her daily activities.   Patient states that she would want intervention if she became symptomatic.   CV Review of Symptoms (Summary) Cardiovascular ROS: no chest pain or dyspnea on exertion negative for - chest pain, dyspnea on exertion, irregular heartbeat, palpitations, rapid heart rate, or shortness of breath  REVIEWED OF SYSTEMS   Review of Systems  Constitutional:  Negative for fever.  Respiratory:  Negative for shortness of breath.   Cardiovascular:  Negative for chest pain, palpitations and leg swelling.  Neurological:  Negative for dizziness and loss of consciousness.   I have reviewed and (if needed) personally updated the patient's problem list, medications, allergies, past medical and surgical history, social and family history.   PAST MEDICAL HISTORY   Past Medical History:  Diagnosis Date   Arthritis    Diverticulitis 1995   And 2007   Hyperlipidemia LDL goal <100    Mitral valve prolapse    Bileaflet MVP with moderate MR.   PAF (paroxysmal atrial fibrillation) ([place]) 11/2017   CHA2DS2 VASc score > 3 (Age >3 and female sex), --> Eliquis & Metoprolol     PAST SURGICAL HISTORY   Past Surgical History:  Procedure Laterality Date   CATARACT EXTRACTION, BILATERAL     TRANSTHORACIC ECHOCARDIOGRAM  3/'19, 9/'19   03/2017: Normal LVET (60-65%).  Gr 1 DD. Mild AI.  Mild MR with bileaflet MVP.  Mild pulm HTN; ECHO 09/27/2017: Normal LVEF w/o RWMA. Diastolic dysfxn with elevated LVEDP.  AoV sclerosis with Mod AI.  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  Severe RA dilation.     TRANSTHORACIC ECHOCARDIOGRAM  12/12/2019    EF 60 to 65%.  No R WMA.  Unable to assess diastolic pressures.  Normal RV pressures.  Mild  biatrial enlargement.  Moderate aortic calcification - no AS.  Severe BI-Leaflet MVP with Mod-Severe MR    Immunization History  Administered Date(s) Administered   Influenza, High Dose Seasonal PF 10/09/2018   PFIZER(Purple Top)SARS-COV-2 Vaccination 01/25/2019, 02/15/2019    MEDICATIONS/ALLERGIES   Current Meds  Medication Sig   apixaban (ELIQUIS) 2.5 MG TABS tablet Take 1 tablet (2.5 mg total) by mouth 2 (two) times daily.   atorvastatin (LIPITOR) 40 MG tablet Take 40 mg by mouth at bedtime.   calcium carbonate (OS-CAL - DOSED IN MG OF ELEMENTAL CALCIUM) 1250 MG tablet Take 1 tablet by mouth daily with breakfast.   metoprolol tartrate (LOPRESSOR) 25 MG tablet TAKE AS DIRECTED BY YOUR PRESCRIBER   Multiple Vitamins-Minerals (MULTIVITAMIN PO) Take 1 tablet by mouth daily.   Vitamin D, Ergocalciferol, 2000 units CAPS Take 2,000 Units by mouth daily. 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   Now lives alone and is planning to move into a retirement community potentially.  She handled his death well, and felt somewhat liberated having the stress of being caregiver removed.    OBJCTIVE -PE, EKG, labs   Wt Readings from Last 3 Encounters:  01/13/21 112 lb 12.8 oz (51.2 kg)  07/08/20 113 lb (51.3 kg)  01/14/20 110 lb 12.8 oz (50.3 kg)   Physical Exam: BP (!) 148/82    Pulse 65    Ht 5\' 2"  (1.575 m)    Wt 112 lb 12.8 oz (51.2 kg)    SpO2 99%    BMI  20.63 kg/m  Physical Exam Constitutional:      General: She is not in acute distress.    Appearance: Normal appearance. She is normal weight. She is not ill-appearing.  HENT:     Head: Normocephalic.  Cardiovascular:     Rate and Rhythm: Normal rate.     Pulses: Normal pulses.     Comments: With systolic murmur at aortic post. Holosystolic murmur at mitral post Pulmonary:     Effort: Pulmonary effort is normal. No respiratory distress.     Breath sounds: Normal breath sounds.  Musculoskeletal:        General: Normal range of motion.     Cervical back: Normal range of motion and neck supple. No rigidity.  Skin:    General: Skin is warm and dry.  Neurological:     General: No focal deficit present.     Mental Status: She is alert.  Psychiatric:        Mood and Affect: Mood normal.        Behavior: Behavior normal.   Cardiac exam performed with the resident describing combination of systolic ejection murmur as well as HSM.  Very difficult to hear midsystolic click.   Adult ECG Report  Rate: 65 ;  Rhythm: normal sinus rhythm  Narrative Interpretation: NSR with LAD and incomplete RBBB  Recent Labs:    Lab Results  Component Value Date   CHOL 221 (H) 09/27/2017   HDL 56 09/27/2017   LDLCALC 152 (H) 09/27/2017   TRIG 66 09/27/2017   CHOLHDL 3.9 09/27/2017   Lab Results  Component Value Date   CREATININE 0.86 12/16/2019   BUN 30 (H) 12/16/2019   NA 141 12/16/2019   K 4.2 12/16/2019   CL 106 12/16/2019   CO2 27 12/16/2019   CBC Latest Ref Rng & Units 12/16/2019 09/28/2017 09/27/2017  WBC 4.0 - 10.5 K/uL 12.3(H) 6.0 9.4  Hemoglobin 12.0 - 15.0 g/dL 13.4 12.6 14.5  Hematocrit 36.0 - 46.0 % 40.8 39.6 45.4  Platelets 150 - 400 K/uL 210 162 207    No results found for: HGBA1C Lab Results  Component Value Date   TSH 6.839 (H) 09/27/2017    =================================================  COVID-19 Education: The signs and symptoms of COVID-19 were discussed with the  patient and how to seek care for testing (follow up with PCP or arrange E-visit).  Resident-total of 20 minutes with the patient spent in direct patient consultation.  I spent an additional 10 minutes with the patient. Additional time spent with chart review  / charting (studies, outside notes, etc): 2 min per resident, 10 minutes attending Total Time: 42 min  Current medicines are reviewed at length with the patient today.   This visit occurred during the SARS-CoV-2 public health emergency.  Safety protocols were in place, including screening questions prior to the visit, additional usage of staff PPE, and extensive cleaning of exam room while observing appropriate contact time as indicated for disinfecting solutions.  Notice: This dictation was prepared with Dragon dictation along with smart phrase technology. Any transcriptional errors that result from this process are unintentional and may not be corrected upon review.  Studies Ordered:   Orders Placed This Encounter  Procedures   EKG 12-Lead   ECHOCARDIOGRAM COMPLETE    Patient Instructions / Medication Changes & Studies & Tests Ordered   Patient Instructions  Medication Instructions:  No changes  *If you need a refill on your cardiac medications before your next appointment, please call your pharmacy*   Lab Work:  Not needed   Testing/Procedures: Will be schedule in Dec 2023  Your physician has requested that you have an echocardiogram. Echocardiography is a painless test that uses sound waves to create images of your heart. It provides your doctor with information about the size and shape of your heart and how well your hearts chambers and valves are working. This procedure takes approximately one hour. There are no restrictions for this procedure.    Follow-Up: At [facility], you and your health needs are our priority.  As part of our continuing mission to provide you with exceptional heart care, we have  created designated Provider Care Teams.  These Care Teams include your primary Cardiologist (physician) and Advanced Practice Providers (APPs -  Physician Assistants and Nurse Practitioners) who all work together to provide you with the care you need, when you need it.     Your next appointment:   6  month(s)  July 2023  The format for your next appointment:   In Person  Provider:   Glenetta Hew, MD     Signed.  Sharion Settler PGY-2 Family Medicine      ATTENDING ATTESTATION  I have seen, examined and evaluated the patient along with the Resident Physician in clinic today.  I personally performed my own interview & exanimation.  After reviewing all the available data and chart, we discussed the patients laboratory, study & physical findings as well as symptoms in detail. I agree with her findings, examination as well as impression recommendations as per our discussion.    Attending adjustments int the full clinic noted annotated in [place].   Doing well overall.  No symptoms to suggest A. fib or CHF.  Relatively healthy otherwise.  Long talk about her desires for procedures going forward.  Would be acceptable to mitral clip if she became symptomatic, but not open surgery.    Glenetta Hew, M.D., M.S. Interventional Cardiologist   Pager # [phone] Phone # [phone] [address]. [place], [place] [address]    Thank you for choosing Heart[facility] at [facility]!!

## 2021-01-13 NOTE — Patient Instructions (Addendum)
Medication Instructions:  No changes  *If you need a refill on your cardiac medications before your next appointment, please call your pharmacy*   Lab Work:  Not needed   Testing/Procedures: Will be schedule in Dec 2023  Your physician has requested that you have an echocardiogram. Echocardiography is a painless test that uses sound waves to create images of your heart. It provides your doctor with information about the size and shape of your heart and how well your hearts chambers and valves are working. This procedure takes approximately one hour. There are no restrictions for this procedure.    Follow-Up: At [facility], you and your health needs are our priority.  As part of our continuing mission to provide you with exceptional heart care, we have created designated Provider Care Teams.  These Care Teams include your primary Cardiologist (physician) and Advanced Practice Providers (APPs -  Physician Assistants and Nurse Practitioners) who all work together to provide you with the care you need, when you need it.     Your next appointment:   6  month(s)  July 2023  The format for your next appointment:   In Person  Provider:   Glenetta Hew, MD

## 2021-01-19 NOTE — Assessment & Plan Note (Addendum)
Hyperlipidemia LDL 162 in 03/2020. On Atorvastatin 40 mg.  -Continue statin -Almost due for repeat lipid panel  She is not always excited about taking additional medications.  She is tolerating atorvastatin, but not all that excited about taking additional medications.  Not unreasonable given her advanced age to avoid pushing further.

## 2021-01-19 NOTE — Assessment & Plan Note (Signed)
Associated with significant mild regurgitation.  Currently asymptomatic with normal EF and wall motion.  LV size still acceptable  -We will ask Dr. Burt Knack and structural heart team to review

## 2021-01-19 NOTE — Assessment & Plan Note (Signed)
She was really intolerant of beta-blocker, as such, she is only to use beta-blocker PRN.  Thankfully, her rate is well controlled without beta-blockade.

## 2021-01-19 NOTE — Assessment & Plan Note (Signed)
Bileaflet MVP with Moderate to Severe MR  Echo stable since Dec 2021. Patient is asymptomatic but has had atrial fibrillation in the past. Long-discussion today regarding whether patient would want intervention. She states that she would want intervention should she become symptomatic.  -Monitor symptoms  -Return in 6 months -Repeat Echo in 1 year    I was present on her desires.  I think she would be willing to u ndergo MitraClip, probably would not want open mitral valve surgery. ->  I will forward her chart to Dr. Burt Knack evaluate echocardiogram-the question will be with this PM to be treated with MitraClip.  Low threshold to consider referral for baseline assessment & agreeable to this. 1 major contributing factor could be if she goes into A. fib or not.

## 2021-01-19 NOTE — Progress Notes (Addendum)
° ° °  ATTENDING ATTESTATION  I have seen, examined and evaluated the patient along with the Resident Physician in clinic today.  I personally performed my own interview & exanimation.  After reviewing all the available data and chart, we discussed the patients laboratory, study & physical findings as well as symptoms in detail. I agree with her findings, examination as well as impression recommendations as per our discussion.    Attending adjustments int the full clinic noted annotated in [place].   Moderate to severe mitral regurgitation Bileaflet MVP with Moderate to Severe MR  Echo stable since Dec 2021. Patient is asymptomatic but has had atrial fibrillation in the past. Long-discussion today regarding whether patient would want intervention. She states that she would want intervention should she become symptomatic.  -Monitor symptoms  -Return in 6 months -Repeat Echo in 1 year    I was present on her desires.  I think she would be willing to u ndergo MitraClip, probably would not want open mitral valve surgery. ->  I will forward her chart to Dr. Burt Knack evaluate echocardiogram-the question will be with this PM to be treated with MitraClip.  Low threshold to consider referral for baseline assessment & agreeable to this. 1 major contributing factor could be if she goes into A. fib or not.  MVP (mitral valve prolapse) Associated with significant mild regurgitation.  Currently asymptomatic with normal EF and wall motion.  LV size still acceptable  -We will ask Dr. Burt Knack and structural heart team to review  PAF (paroxysmal atrial fibrillation) - CHA2DS2Vasc = 4. Low dose Eliquis Paroxysmal Atrial Fibrillation NSR today. -Continue Eliquis and Metoprolol  Thankfully, she is maintaining sinus rhythm.  As such, she is not overly symptomatic with MVP/MR.  I fear that if she were to go into A. fib RVR, she could be pregnant symptomatic and in which case may need intervention. She would be a  relatively urgent cardioversion, and if she has recurrence would warrant antiarrhythmic.  Hyperlipidemia LDL goal <100 Hyperlipidemia LDL 162 in 03/2020. On Atorvastatin 40 mg.  -Continue statin -Almost due for repeat lipid panel  She is not always excited about taking additional medications.  She is tolerating atorvastatin, but not all that excited about taking additional medications.  Not unreasonable given her advanced age to avoid pushing further.  Fatigue due to treatment She was really intolerant of beta-blocker, as such, she is only to use beta-blocker PRN.  Thankfully, her rate is well controlled without beta-blockade.  Doing well overall.  No symptoms to suggest A. fib or CHF.  Relatively healthy otherwise.  Long talk about her desires for procedures going forward.  Would be acceptable to mitral clip if she became symptomatic, but not open surgery.  Follow-up in 6 months    Glenetta Hew, M.D., M.S. Interventional Cardiologist   Pager # [phone] Phone # [phone] [address] [place], [place] [address]   ADDENDUM: From Dr. Burt Knack: Hey Dr. Ellyn Hack. I looked at her images. She does have severe prolapse but I would guess MR is no more than 3+. My concern about her valve is that her anterior leaflet is extremely myxomatous/thickened. I don't feel confident that she would do well with Clip. If really concerned, TEE with one of the structural imagers would be next step and would better define MR severity and whether Clip could be considered. Let me know if you want me to see her to discuss this stuff.   Dr. Burt Knack

## 2021-01-19 NOTE — Assessment & Plan Note (Signed)
Paroxysmal Atrial Fibrillation NSR today. -Continue Eliquis and Metoprolol  Thankfully, she is maintaining sinus rhythm.  As such, she is not overly symptomatic with MVP/MR.  I fear that if she were to go into A. fib RVR, she could be pregnant symptomatic and in which case may need intervention. She would be a relatively urgent cardioversion, and if she has recurrence would warrant antiarrhythmic.

## 2021-02-17 DIAGNOSIS — D044 Carcinoma in situ of skin of scalp and neck: Secondary | ICD-10-CM | POA: Diagnosis not present

## 2021-03-12 DIAGNOSIS — I480 Paroxysmal atrial fibrillation: Secondary | ICD-10-CM | POA: Diagnosis not present

## 2021-03-12 DIAGNOSIS — E7800 Pure hypercholesterolemia, unspecified: Secondary | ICD-10-CM | POA: Diagnosis not present

## 2021-03-12 DIAGNOSIS — Z1389 Encounter for screening for other disorder: Secondary | ICD-10-CM | POA: Diagnosis not present

## 2021-03-12 DIAGNOSIS — M810 Age-related osteoporosis without current pathological fracture: Secondary | ICD-10-CM | POA: Diagnosis not present

## 2021-03-12 DIAGNOSIS — Z0000 Encounter for general adult medical examination without abnormal findings: Secondary | ICD-10-CM | POA: Diagnosis not present

## 2021-04-13 DIAGNOSIS — H5711 Ocular pain, right eye: Secondary | ICD-10-CM | POA: Diagnosis not present

## 2021-04-13 DIAGNOSIS — H01002 Unspecified blepharitis right lower eyelid: Secondary | ICD-10-CM | POA: Diagnosis not present

## 2021-04-13 DIAGNOSIS — H02055 Trichiasis without entropian left lower eyelid: Secondary | ICD-10-CM | POA: Diagnosis not present

## 2021-04-13 DIAGNOSIS — H02052 Trichiasis without entropian right lower eyelid: Secondary | ICD-10-CM | POA: Diagnosis not present

## 2021-04-14 DIAGNOSIS — X32XXXD Exposure to sunlight, subsequent encounter: Secondary | ICD-10-CM | POA: Diagnosis not present

## 2021-04-14 DIAGNOSIS — L570 Actinic keratosis: Secondary | ICD-10-CM | POA: Diagnosis not present

## 2021-04-14 DIAGNOSIS — D044 Carcinoma in situ of skin of scalp and neck: Secondary | ICD-10-CM | POA: Diagnosis not present

## 2021-05-26 DIAGNOSIS — Z08 Encounter for follow-up examination after completed treatment for malignant neoplasm: Secondary | ICD-10-CM | POA: Diagnosis not present

## 2021-05-26 DIAGNOSIS — L570 Actinic keratosis: Secondary | ICD-10-CM | POA: Diagnosis not present

## 2021-05-26 DIAGNOSIS — X32XXXD Exposure to sunlight, subsequent encounter: Secondary | ICD-10-CM | POA: Diagnosis not present

## 2021-05-26 DIAGNOSIS — Z85828 Personal history of other malignant neoplasm of skin: Secondary | ICD-10-CM | POA: Diagnosis not present

## 2021-06-16 DIAGNOSIS — W5503XA Scratched by cat, initial encounter: Secondary | ICD-10-CM | POA: Diagnosis not present

## 2021-06-16 DIAGNOSIS — S81812A Laceration without foreign body, left lower leg, initial encounter: Secondary | ICD-10-CM | POA: Diagnosis not present

## 2021-06-18 ENCOUNTER — Other Ambulatory Visit: Payer: Self-pay | Admitting: Cardiology

## 2021-06-18 DIAGNOSIS — W5503XA Scratched by cat, initial encounter: Secondary | ICD-10-CM | POA: Diagnosis not present

## 2021-06-18 DIAGNOSIS — S80922A Unspecified superficial injury of left lower leg, initial encounter: Secondary | ICD-10-CM | POA: Diagnosis not present

## 2021-06-18 NOTE — Telephone Encounter (Signed)
Prescription refill request for Eliquis received. Indication: Atrial fib Last office visit: 01/13/21  Roni Bread MD Scr: 0.99 on 08/04/20 Age: 88 Weight: 51.2kg  Based on above findings Eliquis 2.'5mg'$  twice daily is the appropriate dose.

## 2021-06-19 ENCOUNTER — Telehealth: Payer: Self-pay | Admitting: Cardiology

## 2021-06-19 NOTE — Telephone Encounter (Signed)
*  STAT* If patient is at the pharmacy, call can be transferred to refill team.   1. Which medications need to be refilled? (please list name of each medication and dose if known)  Metoprolol and Eliquis  2. Which pharmacy/location (including street and city if local pharmacy) is medication to be sent to? [facility]  3. Do they need a 30 day or 90 day supply? 90 days and refills

## 2021-06-22 ENCOUNTER — Other Ambulatory Visit: Payer: Self-pay | Admitting: Cardiology

## 2021-06-23 ENCOUNTER — Other Ambulatory Visit: Payer: Self-pay

## 2021-06-23 MED ORDER — APIXABAN 2.5 MG PO TABS: 2.5000 mg | ORAL_TABLET | Freq: Two times a day (BID) | ORAL | 1 refills | Status: DC

## 2021-06-23 NOTE — Telephone Encounter (Signed)
Prescription refill request for Eliquis received. Indication:Afib Last office visit:1/23 Scr:0.9 Age: 88 Weight:51.2 kg  Prescription refilled

## 2021-06-26 ENCOUNTER — Telehealth: Payer: Self-pay | Admitting: Cardiology

## 2021-06-26 MED ORDER — METOPROLOL TARTRATE 25 MG PO TABS: ORAL_TABLET | ORAL | 3 refills | Status: DC

## 2021-06-26 NOTE — Telephone Encounter (Signed)
There is question from patient's pharmacy on the metoprolol order. It says take metoprolol tartrate 25 mg take as directed by provider. Would you provide how often patient is to take it?

## 2021-06-29 NOTE — Telephone Encounter (Signed)
Patient stated she has been taking metoprolol tartrate 25mg  twice daily. Clarified order that she is to take medication for breakthrough afib, if heart rate is not below 60. She stated she doesn't know when she is in afib. I explained about palpitations, dizziness,  lightheadedness, and sob. Patient will call clinic if she has symptoms and heart rate less than 60. Patient's pharmacy informed.

## 2021-07-01 ENCOUNTER — Other Ambulatory Visit: Payer: Self-pay

## 2021-07-01 MED ORDER — METOPROLOL TARTRATE 25 MG PO TABS: 12.5000 mg | ORAL_TABLET | Freq: Two times a day (BID) | ORAL | 3 refills | Status: DC

## 2021-07-01 NOTE — Telephone Encounter (Signed)
Patient informed to take metoprolol tartrate 12.'5mg'$  (1/2 tablet) twice daily and to take a full tablet once daily if she is having rapid irregular heartbeats. Patient repeated this back to me in her own words. Current BP 121/72, 60. Denies chest pain or rapid heart rate at present.

## 2021-07-06 ENCOUNTER — Ambulatory Visit: Payer: Medicare Other | Admitting: Cardiology

## 2021-08-31 DIAGNOSIS — H26493 Other secondary cataract, bilateral: Secondary | ICD-10-CM | POA: Diagnosis not present

## 2021-08-31 DIAGNOSIS — H524 Presbyopia: Secondary | ICD-10-CM | POA: Diagnosis not present

## 2021-09-02 DIAGNOSIS — L820 Inflamed seborrheic keratosis: Secondary | ICD-10-CM | POA: Diagnosis not present

## 2021-09-02 DIAGNOSIS — L570 Actinic keratosis: Secondary | ICD-10-CM | POA: Diagnosis not present

## 2021-09-02 DIAGNOSIS — X32XXXD Exposure to sunlight, subsequent encounter: Secondary | ICD-10-CM | POA: Diagnosis not present

## 2021-10-20 DIAGNOSIS — L570 Actinic keratosis: Secondary | ICD-10-CM | POA: Diagnosis not present

## 2021-10-20 DIAGNOSIS — X32XXXD Exposure to sunlight, subsequent encounter: Secondary | ICD-10-CM | POA: Diagnosis not present

## 2021-10-24 ENCOUNTER — Other Ambulatory Visit: Payer: Self-pay

## 2021-10-24 ENCOUNTER — Encounter (HOSPITAL_COMMUNITY): Payer: Self-pay

## 2021-10-24 ENCOUNTER — Inpatient Hospital Stay (HOSPITAL_COMMUNITY)
Admission: EM | Admit: 2021-10-24 | Discharge: 2021-10-29 | DRG: 309 | Disposition: A | Discharge status: Home / Self Care | Payer: Medicare Other | Attending: Internal Medicine | Admitting: Internal Medicine

## 2021-10-24 ENCOUNTER — Emergency Department (HOSPITAL_COMMUNITY): Payer: Medicare Other

## 2021-10-24 DIAGNOSIS — I5030 Unspecified diastolic (congestive) heart failure: Secondary | ICD-10-CM | POA: Diagnosis not present

## 2021-10-24 DIAGNOSIS — I4891 Unspecified atrial fibrillation: Secondary | ICD-10-CM | POA: Diagnosis not present

## 2021-10-24 DIAGNOSIS — R0602 Shortness of breath: Secondary | ICD-10-CM | POA: Diagnosis not present

## 2021-10-24 DIAGNOSIS — I088 Other rheumatic multiple valve diseases: Secondary | ICD-10-CM | POA: Diagnosis not present

## 2021-10-24 DIAGNOSIS — I5031 Acute diastolic (congestive) heart failure: Secondary | ICD-10-CM | POA: Diagnosis not present

## 2021-10-24 DIAGNOSIS — Z79899 Other long term (current) drug therapy: Secondary | ICD-10-CM

## 2021-10-24 DIAGNOSIS — Z881 Allergy status to other antibiotic agents status: Secondary | ICD-10-CM

## 2021-10-24 DIAGNOSIS — I083 Combined rheumatic disorders of mitral, aortic and tricuspid valves: Secondary | ICD-10-CM | POA: Diagnosis present

## 2021-10-24 DIAGNOSIS — I341 Nonrheumatic mitral (valve) prolapse: Secondary | ICD-10-CM | POA: Diagnosis not present

## 2021-10-24 DIAGNOSIS — Z885 Allergy status to narcotic agent status: Secondary | ICD-10-CM

## 2021-10-24 DIAGNOSIS — I340 Nonrheumatic mitral (valve) insufficiency: Secondary | ICD-10-CM | POA: Diagnosis not present

## 2021-10-24 DIAGNOSIS — I110 Hypertensive heart disease with heart failure: Secondary | ICD-10-CM | POA: Diagnosis present

## 2021-10-24 DIAGNOSIS — Z882 Allergy status to sulfonamides status: Secondary | ICD-10-CM

## 2021-10-24 DIAGNOSIS — I272 Pulmonary hypertension, unspecified: Secondary | ICD-10-CM | POA: Diagnosis present

## 2021-10-24 DIAGNOSIS — E8779 Other fluid overload: Secondary | ICD-10-CM | POA: Diagnosis not present

## 2021-10-24 DIAGNOSIS — J90 Pleural effusion, not elsewhere classified: Secondary | ICD-10-CM | POA: Diagnosis not present

## 2021-10-24 DIAGNOSIS — Z880 Allergy status to penicillin: Secondary | ICD-10-CM

## 2021-10-24 DIAGNOSIS — I4510 Unspecified right bundle-branch block: Secondary | ICD-10-CM | POA: Diagnosis present

## 2021-10-24 DIAGNOSIS — I480 Paroxysmal atrial fibrillation: Principal | ICD-10-CM | POA: Diagnosis present

## 2021-10-24 DIAGNOSIS — E785 Hyperlipidemia, unspecified: Secondary | ICD-10-CM | POA: Diagnosis not present

## 2021-10-24 DIAGNOSIS — E876 Hypokalemia: Secondary | ICD-10-CM | POA: Diagnosis not present

## 2021-10-24 DIAGNOSIS — R0600 Dyspnea, unspecified: Secondary | ICD-10-CM | POA: Diagnosis not present

## 2021-10-24 DIAGNOSIS — R000 Tachycardia, unspecified: Secondary | ICD-10-CM | POA: Diagnosis not present

## 2021-10-24 DIAGNOSIS — Z7901 Long term (current) use of anticoagulants: Secondary | ICD-10-CM | POA: Diagnosis not present

## 2021-10-24 DIAGNOSIS — I482 Chronic atrial fibrillation, unspecified: Secondary | ICD-10-CM | POA: Diagnosis not present

## 2021-10-24 DIAGNOSIS — E860 Dehydration: Secondary | ICD-10-CM | POA: Diagnosis not present

## 2021-10-24 DIAGNOSIS — Z8249 Family history of ischemic heart disease and other diseases of the circulatory system: Secondary | ICD-10-CM

## 2021-10-24 DIAGNOSIS — I700 Atherosclerosis of aorta: Secondary | ICD-10-CM | POA: Diagnosis not present

## 2021-10-24 DIAGNOSIS — M1990 Unspecified osteoarthritis, unspecified site: Secondary | ICD-10-CM | POA: Diagnosis not present

## 2021-10-24 DIAGNOSIS — I499 Cardiac arrhythmia, unspecified: Secondary | ICD-10-CM | POA: Diagnosis not present

## 2021-10-24 DIAGNOSIS — D6869 Other thrombophilia: Secondary | ICD-10-CM | POA: Insufficient documentation

## 2021-10-24 LAB — CBC WITH DIFFERENTIAL/PLATELET
Abs Immature Granulocytes: 0.03 10*3/uL (ref 0.00–0.07)
Basophils Absolute: 0.1 10*3/uL (ref 0.0–0.1)
Basophils Relative: 1 %
Eosinophils Absolute: 0.2 10*3/uL (ref 0.0–0.5)
Eosinophils Relative: 3 %
HCT: 42.9 % (ref 36.0–46.0)
Hemoglobin: 14.1 g/dL (ref 12.0–15.0)
Immature Granulocytes: 0 %
Lymphocytes Relative: 14 %
Lymphs Abs: 1.2 10*3/uL (ref 0.7–4.0)
MCH: 32 pg (ref 26.0–34.0)
MCHC: 32.9 g/dL (ref 30.0–36.0)
MCV: 97.3 fL (ref 80.0–100.0)
Monocytes Absolute: 0.9 10*3/uL (ref 0.1–1.0)
Monocytes Relative: 10 %
Neutro Abs: 6.1 10*3/uL (ref 1.7–7.7)
Neutrophils Relative %: 72 %
Platelets: 245 10*3/uL (ref 150–400)
RBC: 4.41 MIL/uL (ref 3.87–5.11)
RDW: 13.2 % (ref 11.5–15.5)
WBC: 8.5 10*3/uL (ref 4.0–10.5)
nRBC: 0 % (ref 0.0–0.2)

## 2021-10-24 LAB — COMPREHENSIVE METABOLIC PANEL
ALT: 44 U/L (ref 0–44)
AST: 43 U/L — ABNORMAL HIGH (ref 15–41)
Albumin: 3.4 g/dL — ABNORMAL LOW (ref 3.5–5.0)
Alkaline Phosphatase: 145 U/L — ABNORMAL HIGH (ref 38–126)
Anion gap: 9 (ref 5–15)
BUN: 20 mg/dL (ref 8–23)
CO2: 24 mmol/L (ref 22–32)
Calcium: 9.5 mg/dL (ref 8.9–10.3)
Chloride: 108 mmol/L (ref 98–111)
Creatinine, Ser: 1.17 mg/dL — ABNORMAL HIGH (ref 0.44–1.00)
GFR, Estimated: 44 mL/min — ABNORMAL LOW (ref >60)
Glucose, Bld: 144 mg/dL — ABNORMAL HIGH (ref 70–99)
Potassium: 4.3 mmol/L (ref 3.5–5.1)
Sodium: 141 mmol/L (ref 135–145)
Total Bilirubin: 1.2 mg/dL (ref 0.3–1.2)
Total Protein: 6.5 g/dL (ref 6.5–8.1)

## 2021-10-24 LAB — PROTIME-INR
INR: 1.4 — ABNORMAL HIGH (ref 0.8–1.2)
Prothrombin Time: 16.8 seconds — ABNORMAL HIGH (ref 11.4–15.2)

## 2021-10-24 LAB — TSH: TSH: 3.1 u[IU]/mL (ref 0.350–4.500)

## 2021-10-24 LAB — TROPONIN I (HIGH SENSITIVITY)
Troponin I (High Sensitivity): 21 ng/L — ABNORMAL HIGH (ref <18)
Troponin I (High Sensitivity): 15 ng/L (ref <18)

## 2021-10-24 MED ORDER — ACETAMINOPHEN 325 MG PO TABS
650.0000 mg | ORAL_TABLET | ORAL | Status: DC | PRN
  Administered 2021-10-25: 650 mg via ORAL
  Filled 2021-10-24: qty 2

## 2021-10-24 MED ORDER — ATORVASTATIN CALCIUM 40 MG PO TABS
40.0000 mg | ORAL_TABLET | Freq: Every day | ORAL | Status: DC
  Administered 2021-10-24 – 2021-10-28 (×5): 40 mg via ORAL
  Filled 2021-10-24 (×5): qty 1

## 2021-10-24 MED ORDER — ONDANSETRON HCL 4 MG/2ML IJ SOLN: 4.0000 mg | Freq: Four times a day (QID) | INTRAMUSCULAR | Status: DC | PRN

## 2021-10-24 MED ORDER — SODIUM CHLORIDE 0.9 % IV BOLUS
500.0000 mL | Freq: Once | INTRAVENOUS | Status: AC
  Administered 2021-10-24: 500 mL via INTRAVENOUS

## 2021-10-24 MED ORDER — APIXABAN 2.5 MG PO TABS
2.5000 mg | ORAL_TABLET | Freq: Two times a day (BID) | ORAL | Status: DC
  Administered 2021-10-24 – 2021-10-29 (×10): 2.5 mg via ORAL
  Filled 2021-10-24 (×10): qty 1

## 2021-10-24 MED ORDER — DILTIAZEM LOAD VIA INFUSION
10.0000 mg | Freq: Once | INTRAVENOUS | Status: AC
  Administered 2021-10-24: 10 mg via INTRAVENOUS
  Filled 2021-10-24: qty 10

## 2021-10-24 MED ORDER — METOPROLOL TARTRATE 5 MG/5ML IV SOLN
2.5000 mg | Freq: Once | INTRAVENOUS | Status: AC
  Administered 2021-10-24: 2.5 mg via INTRAVENOUS
  Filled 2021-10-24: qty 5

## 2021-10-24 MED ORDER — DILTIAZEM HCL-DEXTROSE 125-5 MG/125ML-% IV SOLN (PREMIX)
5.0000 mg/h | INTRAVENOUS | Status: DC
  Administered 2021-10-24: 5 mg/h via INTRAVENOUS
  Administered 2021-10-25: 10 mg/h via INTRAVENOUS
  Filled 2021-10-24 (×2): qty 125

## 2021-10-24 NOTE — H&P (Signed)
Cardiology Admission History and Physical   Patient ID: Lisa Lang MRN: [id number]; DOB: [birth year]   Admission date: 10/24/2021  PCP:  Lavone Orn, [facility] Cardiologist:  Glenetta Hew, MD        Chief Complaint:  palpitations   Patient Profile:   Lisa Lang is a 88 y.o. female with RBBB, mitral valve prolapse with moderate to severe MR, HLD, arthritis, and PAF on Eliquis who is being seen 10/24/2021 for the evaluation of afib with RVR   History of Present Illness:   Lisa Lang has been followed by Dr. Ellyn Hack for her cardiology care. She has a history of PAF but very few episodes. She was intolerant to scheduled beta blockers and placed on PRN metoprolol. She also was followed for severe MVP with moderate to severe MR. This was reviewed with Dr. Burt Knack who was concerned about her anterior leaflet, which was extremely myxomatous/thickened. He was not confident that she would do well with Clip. If there was great concern for symptomatic MR, TEE with one of the structural imagers was felt to be next step to better define MR severity and whether Clip could be considered. Given very stable symptoms, plan was for continued monitoring.   She was in her usual state of health until she developed palpitations associated with dyspnea. She was brought to [facility] ED via EMS. She denies having chest pain, lightheadedness or dizziness. Denies nausea or diaphoresis. Has not missed any doses of Eliquis.  ECG in ER showed afib with RVR, RBBB, HR 136 bpm. Creat 1.17, K 4.3, WBC 8.5, PLT 245, Hg 14.1.  Past Medical History:  Diagnosis Date   Arthritis    Diverticulitis 1995   And 2007   Hyperlipidemia LDL goal <100    Mitral valve prolapse    Bileaflet MVP with moderate MR.   PAF (paroxysmal atrial fibrillation) ([facility]) 11/2017   CHA2DS2 VASc score > 3 (Age >5 and female sex), --> Eliquis & Metoprolol     Past Surgical History:  Procedure Laterality Date    CATARACT EXTRACTION, BILATERAL     TRANSTHORACIC ECHOCARDIOGRAM  3/'19, 9/'19   03/2017: Normal LVET (60-65%).  Gr 1 DD. Mild AI.  Mild MR with bileaflet MVP.  Mild pulm HTN; ECHO 09/27/2017: Normal LVEF w/o RWMA. Diastolic dysfxn with elevated LVEDP.  AoV sclerosis with Mod AI.  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  Severe RA dilation.     TRANSTHORACIC ECHOCARDIOGRAM  12/12/2019    EF 60 to 65%.  No R WMA.  Unable to assess diastolic pressures.  Normal RV pressures.  Mild biatrial enlargement.  Moderate aortic calcification - no AS.  Severe BI-Leaflet MVP with Mod-Severe MR     Medications Prior to Admission: Prior to Admission medications   Medication Sig Start Date End Date Taking? Authorizing Provider  apixaban (ELIQUIS) 2.5 MG TABS tablet Take 1 tablet (2.5 mg total) by mouth 2 (two) times daily. 06/23/21   Leonie Man, MD  atorvastatin (LIPITOR) 40 MG tablet Take 40 mg by mouth at bedtime. 09/05/17   [provider]  calcium carbonate (OS-CAL - DOSED IN MG OF ELEMENTAL CALCIUM) 1250 MG tablet Take 1 tablet by mouth daily with breakfast.    [provider]  metoprolol tartrate (LOPRESSOR) 25 MG tablet Take 0.5 tablets (12.5 mg total) by mouth 2 (two) times daily. Take a full tablet once daily if having rapid irregular heartbeats. 07/01/21  Leonie Man, MD  Multiple Vitamins-Minerals (MULTIVITAMIN PO) Take 1 tablet by mouth daily.    [provider]  Vitamin D, Ergocalciferol, 2000 units CAPS Take 2,000 Units by mouth daily. Twice a month    [provider]     Allergies:    Allergies  Allergen Reactions   Amoxicillin    Ciprofloxacin    Lidocaine    Other Other (See Comments)   Shellfish Allergy Other (See Comments)   Sulfa Antibiotics     Social History:   Social History   Socioeconomic History   Marital status: Married    Spouse name: Not on file   Number of children: Not on file   Years of  education: Not on file   Highest education level: Not on file  Occupational History   Occupation: Retired  Tobacco Use   Smoking status: Never   Smokeless tobacco: Never  Substance and Sexual Activity   Alcohol use: No   Drug use: No   Sexual activity: Not on file  Other Topics Concern   Not on file  Social History Narrative   Recently widowed November 2019--she had been the primary caregiver for her husband who had progressively worsening dementia.   Now lives alone and is planning to move into a retirement community potentially.  She handled his death well, and felt somewhat liberated having the stress of being caregiver removed.   Social Determinants of Health   Financial Resource Strain: Not on file  Food Insecurity: Not on file  Transportation Needs: Not on file  Physical Activity: Not on file  Stress: Not on file  Social Connections: Not on file  Intimate Partner Violence: Not on file    Family History:   The patient's family history includes CAD in her father; Cervical cancer in her mother; Lung cancer in her sister. There is no history of Stroke or Diabetes.    ROS:  Please see the history of present illness.  All other ROS reviewed and negative.     Physical Exam/Data:   Vitals:   10/24/21 1330 10/24/21 1400 10/24/21 1545 10/24/21 1600  BP: 132/87 119/77 115/69 107/72  Pulse: (!) 45 94  82  Resp: 14 18 (!) 21 20  Temp:      TempSrc:      SpO2: 94% 96% 95% 95%   No intake or output data in the 24 hours ending 10/24/21 1612    01/13/2021    4:48 PM 07/08/2020    3:01 PM 01/14/2020   11:59 AM  Last 3 Weights  Weight (lbs) 112 lb 12.8 oz 113 lb 110 lb 12.8 oz  Weight (kg) 51.166 kg 51.256 kg 50.259 kg     There is no height or weight on file to calculate BMI.  General:  Well nourished, well developed, in no acute distress HEENT: normal Neck: no JVD Vascular: No carotid bruits; Distal pulses 2+ bilaterally   Cardiac:  normal S1, S2; irreg irreg, tachy; 3/6  holosystolic mumur at apex. Lungs:  clear to auscultation bilaterally, no wheezing, rhonchi or rales  Abd: soft, nontender, no hepatomegaly  Ext: no edema Musculoskeletal:  No deformities, BUE and BLE strength normal and equal Skin: warm and dry  Neuro:  CNs 2-12 intact, no focal abnormalities noted Psych:  Normal affect    EKG:  The ECG that was done  was personally reviewed and demonstrates afib with RVR, RBBB, HR 136 bpm.  Relevant CV Studies: ECho 12/22/21 IMPRESSIONS   1.  Left ventricular ejection fraction, by estimation, is 60 to 65%. Left  ventricular ejection fraction by 3D volume is 61 %. The left ventricle has  normal function. The left ventricle has no regional wall motion  abnormalities. There is mild asymmetric  left ventricular hypertrophy of the basal-septal segment. Left ventricular  diastolic parameters are consistent with Grade II diastolic dysfunction  (pseudonormalization).   2. Right ventricular systolic function is normal. The right ventricular  size is normal. There is normal pulmonary artery systolic pressure. The  estimated right ventricular systolic pressure is 40.1 mmHg.   3. Left atrial size was mildly dilated.   4. Right atrial size was mildly dilated.   5. The mitral valve is myxomatous. Moderate-to-severe mitral valve  regurgitation. No evidence of mitral stenosis. There is severe prolapse of  both leaflets of the mitral valve.   6. The tricuspid valve is myxomatous. Tricuspid valve regurgitation is  moderate.   7. The aortic valve is tricuspid. There is mild calcification of the  aortic valve. There is moderate thickening of the aortic valve. Aortic  valve regurgitation is mild to moderate. Aortic valve  sclerosis/calcification is present, without any evidence of   aortic stenosis.   8. The inferior vena cava is normal in size with greater than 50%  respiratory variability, suggesting right atrial pressure of 3 mmHg.   Comparison(s): Compared to  prior TTE on 12/2019, there is no significant  change.   Laboratory Data:  High Sensitivity Troponin:   Recent Labs  Lab 10/24/21 1105 10/24/21 1308  TROPONINIHS 21* 15      Chemistry Recent Labs  Lab 10/24/21 1105  NA 141  K 4.3  CL 108  CO2 24  GLUCOSE 144*  BUN 20  CREATININE 1.17*  CALCIUM 9.5  GFRNONAA 44*  ANIONGAP 9    Recent Labs  Lab 10/24/21 1105  PROT 6.5  ALBUMIN 3.4*  AST 43*  ALT 44  ALKPHOS 145*  BILITOT 1.2   Lipids No results for input(s): "CHOL", "TRIG", "HDL", "LABVLDL", "LDLCALC", "CHOLHDL" in the last 168 hours. Hematology Recent Labs  Lab 10/24/21 1105  WBC 8.5  RBC 4.41  HGB 14.1  HCT 42.9  MCV 97.3  MCH 32.0  MCHC 32.9  RDW 13.2  PLT 245   Thyroid No results for input(s): "TSH", "FREET4" in the last 168 hours. BNPNo results for input(s): "BNP", "PROBNP" in the last 168 hours.  DDimer No results for input(s): "DDIMER" in the last 168 hours.   Radiology/Studies:  DG Chest 2 View  Result Date: 10/24/2021 CLINICAL DATA:  Shortness of breath. EXAM: CHEST - 2 VIEW COMPARISON:  09/26/2017 chest radiograph FINDINGS: Cardiomegaly noted. Mild bibasilar opacities/atelectasis noted as well as a trace RIGHT pleural effusion. There is no evidence of pneumothorax. No acute bony abnormalities are present. IMPRESSION: 1. Mild bibasilar opacities/atelectasis and trace RIGHT pleural effusion. Pneumonia is not excluded. 2. Cardiomegaly. Electronically Signed   By: Margarette Canada M.D.   On: 10/24/2021 11:45     Assessment and Plan:   Afib with RVR: started on a Cardizem gtt. Continue home Eliquis ( no doses missed).   MVP with moderate to severe MR: due for repeat echo. Will do while admitted. This was previously reviewed with Dr. Burt Knack who was concerned about her anterior leaflet, which was extremely myxomatous/thickened. He was not confident that she would do well with Clip. If there was great concern for symptomatic MR, TEE with one of the  structural imagers was felt to  be next step to better define MR severity and whether Clip could be considered. No evidence of volume overload this admission.    Risk Assessment/Risk Scores:         CHA2DS2-VASc Score = 4   This indicates a 4.8% annual risk of stroke. The patient's score is based upon: CHF History: 0 HTN History: 1 Diabetes History: 0 Stroke History: 0 Vascular Disease History: 0 Age Score: 2 Gender Score: 1      Severity of Illness: The appropriate patient status for this patient is OBSERVATION. Observation status is judged to be reasonable and necessary in order to provide the required intensity of service to ensure the patient's safety. The patient's presenting symptoms, physical exam findings, and initial radiographic and laboratory data in the context of their medical condition is felt to place them at decreased risk for further clinical deterioration. Furthermore, it is anticipated that the patient will be medically stable for discharge from the hospital within 2 midnights of admission.    For questions or updates, please contact [place] Please consult [email or web address] for contact info under     Signed, Angelena Form, PA-C  10/24/2021 4:12 PM

## 2021-10-24 NOTE — ED Provider Notes (Signed)
[facility] EMERGENCY DEPARTMENT Provider Note   CSN: [id number] Arrival date & time: 10/24/21  1046     History  Chief Complaint  Patient presents with   Atrial Fibrillation    Lisa Lang is a 88 y.o. female. With past medical history of HLD, MVP, PAF anticoagulated on Eliquis who presents ot the emergency department with shortness of breath.  States symptoms began yesterday evening with palpitations. States that her palpitations have improved but continues to feel mildly short of breath. She denies having chest pain, lightheadedness or dizziness. Denies nausea or diaphoresis. States she has a history of atrial fibrillation but this was converted previously with medication. States that she is on Eliquis. No missed doses.   HPI     Home Medications Prior to Admission medications   Medication Sig Start Date End Date Taking? Authorizing Provider  apixaban (ELIQUIS) 2.5 MG TABS tablet Take 1 tablet (2.5 mg total) by mouth 2 (two) times daily. 06/23/21   Leonie Man, MD  atorvastatin (LIPITOR) 40 MG tablet Take 40 mg by mouth at bedtime. 09/05/17   [provider]  calcium carbonate (OS-CAL - DOSED IN MG OF ELEMENTAL CALCIUM) 1250 MG tablet Take 1 tablet by mouth daily with breakfast.    [provider]  metoprolol tartrate (LOPRESSOR) 25 MG tablet Take 0.5 tablets (12.5 mg total) by mouth 2 (two) times daily. Take a full tablet once daily if having rapid irregular heartbeats. 07/01/21   Leonie Man, MD  Multiple Vitamins-Minerals (MULTIVITAMIN PO) Take 1 tablet by mouth daily.    [provider]  Vitamin D, Ergocalciferol, 2000 units CAPS Take 2,000 Units by mouth daily. Twice a month    [provider]      Allergies    Amoxicillin, Ciprofloxacin, Lidocaine, Other, Shellfish allergy, and Sulfa antibiotics    Review of Systems   Review of Systems  Constitutional:  Negative for diaphoresis and fever.  Respiratory:   Positive for shortness of breath. Negative for cough.   Cardiovascular:  Positive for chest pain and palpitations. Negative for leg swelling.  Gastrointestinal:  Negative for nausea.  Neurological:  Negative for dizziness, syncope and light-headedness.  All other systems reviewed and are negative.   Physical Exam Updated Vital Signs BP 119/77   Pulse 94   Temp 98 F (36.7 C) (Oral)   Resp 18   SpO2 96%  Physical Exam Vitals and nursing note reviewed.  Constitutional:      General: She is not in acute distress.    Appearance: Normal appearance. She is normal weight. She is not ill-appearing or toxic-appearing.  HENT:     Head: Normocephalic and atraumatic.     Mouth/Throat:     Mouth: Mucous membranes are moist.     Pharynx: Oropharynx is clear.  Eyes:     General: No scleral icterus.    Extraocular Movements: Extraocular movements intact.  Cardiovascular:     Rate and Rhythm: Tachycardia present. Rhythm irregularly irregular.     Pulses:          Radial pulses are 1+ on the right side and 1+ on the left side.     Heart sounds: No murmur heard. Pulmonary:     Effort: Pulmonary effort is normal. No respiratory distress.     Breath sounds: Normal breath sounds.  Abdominal:     Palpations: Abdomen is soft.  Musculoskeletal:     Cervical back: Neck supple.  Skin:    General:  Skin is warm and dry.     Findings: No rash.  Neurological:     General: No focal deficit present.     Mental Status: She is alert and oriented to person, place, and time. Mental status is at baseline.  Psychiatric:        Mood and Affect: Mood normal.        Behavior: Behavior normal.        Thought Content: Thought content normal.        Judgment: Judgment normal.     ED Results / Procedures / Treatments   Labs (all labs ordered are listed, but only abnormal results are displayed) Labs Reviewed  COMPREHENSIVE METABOLIC PANEL - Abnormal; Notable for the following components:      Result  Value   Glucose, Bld 144 (*)    Creatinine, Ser 1.17 (*)    Albumin 3.4 (*)    AST 43 (*)    Alkaline Phosphatase 145 (*)    GFR, Estimated 44 (*)    All other components within normal limits  PROTIME-INR - Abnormal; Notable for the following components:   Prothrombin Time 16.8 (*)    INR 1.4 (*)    All other components within normal limits  TROPONIN I (HIGH SENSITIVITY) - Abnormal; Notable for the following components:   Troponin I (High Sensitivity) 21 (*)    All other components within normal limits  CBC WITH DIFFERENTIAL/PLATELET  TROPONIN I (HIGH SENSITIVITY)    EKG EKG Interpretation  Date/Time:  Saturday October 24 2021 10:44:26 EDT Ventricular Rate:  136 PR Interval:    QRS Duration: 132 QT Interval:  340 QTC Calculation: 511 R Axis:   134 Text Interpretation: Atrial fibrillation with rapid ventricular response Right bundle branch block Non-specific ST-t changes Confirmed by Lajean Saver [phone]) on 10/24/2021 1:01:13 PM  Radiology DG Chest 2 View  Result Date: 10/24/2021 CLINICAL DATA:  Shortness of breath. EXAM: CHEST - 2 VIEW COMPARISON:  09/26/2017 chest radiograph FINDINGS: Cardiomegaly noted. Mild bibasilar opacities/atelectasis noted as well as a trace RIGHT pleural effusion. There is no evidence of pneumothorax. No acute bony abnormalities are present. IMPRESSION: 1. Mild bibasilar opacities/atelectasis and trace RIGHT pleural effusion. Pneumonia is not excluded. 2. Cardiomegaly. Electronically Signed   By: Margarette Canada M.D.   On: 10/24/2021 11:45    Procedures Procedures   Medications Ordered in ED Medications  diltiazem (CARDIZEM) 1 mg/mL load via infusion 10 mg (10 mg Intravenous Bolus from Bag 10/24/21 1314)    And  diltiazem (CARDIZEM) 125 mg in dextrose 5% 125 mL (1 mg/mL) infusion (5 mg/hr Intravenous New Bag/Given 10/24/21 1321)  metoprolol tartrate (LOPRESSOR) injection 2.5 mg (has no administration in time range)  sodium chloride 0.9 % bolus 500  mL (0 mLs Intravenous Stopped 10/24/21 1407)    ED Course/ Medical Decision Making/ A&P Clinical Course as of 10/24/21 1500  Sat Oct 24, 2021  1439 After >1 hr on dilt gtt, not converted. Rate improved. Will consult cards for recommendation on cardioversion, antiarrhythmics [LA]  1454 Spoke with Dr. Nechama Guard, cardiology who will come evaluate patient before making decision on electrical cardioversion vs. antiarrhythmics [LA]  1459 89 yoF, afib on eliquis and metop. Palpitations since yesterday. In RVR in triage to 140s. HDS. Got cardizem load and infusion. Cardiology to eval to the patient to determine if she will need electrical cardioversion [JN]    Clinical Course User Index [JN] Nogle, [place], MD [LA] Mickie Hillier, PA-C  Medical Decision Making Amount and/or Complexity of Data Reviewed Labs: ordered. Radiology: ordered.  Risk Prescription drug management.  This patient presents to the ED with chief complaint(s) of shortness of breath with pertinent past medical history of HLD, MVP, PAF which further complicates the presenting complaint. The complaint involves an extensive differential diagnosis and treatment options and also carries with it a high risk of complications and morbidity.    The differential diagnosis includes ACS, PE, pneumothorax, pleural effusion, cardiac tamponade, aortic dissection, COPD exacerbation, arrhythmia, pneumonia, asthma exacerbation, congestive heart failure, viral upper respiratory infection, medication side effect, anaphylaxis, etc.     Additional history obtained: Additional history obtained from  none available Records reviewed Care Everywhere/External Records and Primary Care Documents most recent cardiology physician note  ED Course: Lab Tests: I Ordered, and personally interpreted labs.  The pertinent results include:   Cr 1.17, mild increase, likely from decreased CO 2/2 Afib rvr. Will given 500 IVF  Troponin 21, 15   Imaging Studies: I ordered and independently visualized and interpreted the following imaging X-ray chest   which showed mild bibasilar opacities, trace right pleural effusion, again ?2/2 afib rvr and decreased CO The interpretation of the imaging was limited to assessing for emergent pathology, for which purpose it was ordered. Cardiac Monitoring: The patient was maintained on a cardiac monitor.  I personally viewed and interpreted the cardiac monitor which showed an underlying rhythm of:  Atrial Fibrillation Medicines ordered and prescription drug management: I ordered the following medications cardizem for cardioversion   I considered this additional medications: metoprolol Reevaluation of the patient after these medicines showed that the patient    stayed the same  Consultations Obtained: I requested consultation with the consultant cardiology , and discussed  findings as well as pertinent plan - they recommend: spoke with Dr. Gardiner Rhyme, cardiology who will come evaluate the patient and make decision on electrical vs chemical cardioversion.    Care of patient being handed off to Dr. Oswaldo Milian, resident. Patient is pending cardiology consult and recommendations. Disposition pending their evaluation. Please see her note for completion of care.  Final Clinical Impression(s) / ED Diagnoses Final diagnoses:  Atrial fibrillation with rapid ventricular response [facility])    Rx / DC Orders ED Discharge Orders     None         Mickie Hillier, PA-C 10/24/21 1502    Lajean Saver, MD 10/24/21 1530

## 2021-10-24 NOTE — ED Provider Triage Note (Signed)
Emergency Medicine Provider Triage Evaluation Note  BERNISE SYLVAIN , a 88 y.o. female  was evaluated in triage.  Pt complains of shortness of breath. States she began having palpitations last night. Palpitations have improved but continues to feel short of breath. She denies chest pain. Has history of atrial fibrillation which she says was fixed with medications. Has been on eliquis every since. No missed doses.   Review of Systems  Positive: See above Negative:   Physical Exam  BP (!) 146/128 (BP Location: Right Arm)   Pulse (!) 133   Temp 98 F (36.7 C) (Oral)   Resp 16   SpO2 97%  Gen:   Awake, no distress, alert Resp:  Normal effort  MSK:   Moves extremities without difficulty  Other:  Irregularly irregular rhythm on auscultation. Lungs clear  Medical Decision Making  Medically screening exam initiated at 10:51 AM.  Appropriate orders placed.  AMENAH TUCCI was informed that the remainder of the evaluation will be completed by another provider, this initial triage assessment does not replace that evaluation, and the importance of remaining in the ED until their evaluation is complete.  Afib RVR. Will need room prioritization. Triage RN Gilberto Better aware.    Mickie Hillier, PA-C 10/24/21 1055

## 2021-10-24 NOTE — ED Provider Notes (Signed)
  Physical Exam  BP 119/77   Pulse 94   Temp 98 F (36.7 C) (Oral)   Resp 18   SpO2 96%   Physical Exam  Procedures  Procedures  ED Course / MDM   Clinical Course as of 10/24/21 1501  Sat Oct 24, 2021  1439 After >1 hr on dilt gtt, not converted. Rate improved. Will consult cards for recommendation on cardioversion, antiarrhythmics [LA]  1454 Spoke with Dr. Nechama Guard, cardiology who will come evaluate patient before making decision on electrical cardioversion vs. antiarrhythmics [LA]  1459 89 yoF, afib on eliquis and metop. Palpitations since yesterday. In RVR in triage to 140s. HDS. Got cardizem load and infusion. Cardiology to eval to the patient to determine if she will need electrical cardioversion [JN]    Clinical Course User Index [JN] Meztli Llanas, [place], MD [LA] Mickie Hillier, PA-C   Medical Decision Making Amount and/or Complexity of Data Reviewed Labs: ordered. Radiology: ordered.  Risk Prescription drug management.   ***

## 2021-10-24 NOTE — ED Triage Notes (Signed)
Patient here by EMS from home with palpitations today. Patient alert and oriented and denies cp. Has remote hx of atrial fib. Alert and oriented

## 2021-10-25 ENCOUNTER — Observation Stay (HOSPITAL_COMMUNITY): Payer: Medicare Other

## 2021-10-25 DIAGNOSIS — Z881 Allergy status to other antibiotic agents status: Secondary | ICD-10-CM | POA: Diagnosis not present

## 2021-10-25 DIAGNOSIS — I5031 Acute diastolic (congestive) heart failure: Secondary | ICD-10-CM | POA: Diagnosis not present

## 2021-10-25 DIAGNOSIS — I480 Paroxysmal atrial fibrillation: Secondary | ICD-10-CM | POA: Diagnosis present

## 2021-10-25 DIAGNOSIS — I083 Combined rheumatic disorders of mitral, aortic and tricuspid valves: Secondary | ICD-10-CM | POA: Diagnosis present

## 2021-10-25 DIAGNOSIS — I5030 Unspecified diastolic (congestive) heart failure: Secondary | ICD-10-CM | POA: Diagnosis present

## 2021-10-25 DIAGNOSIS — I341 Nonrheumatic mitral (valve) prolapse: Secondary | ICD-10-CM

## 2021-10-25 DIAGNOSIS — I482 Chronic atrial fibrillation, unspecified: Secondary | ICD-10-CM

## 2021-10-25 DIAGNOSIS — I4510 Unspecified right bundle-branch block: Secondary | ICD-10-CM | POA: Diagnosis present

## 2021-10-25 DIAGNOSIS — Z882 Allergy status to sulfonamides status: Secondary | ICD-10-CM | POA: Diagnosis not present

## 2021-10-25 DIAGNOSIS — Z885 Allergy status to narcotic agent status: Secondary | ICD-10-CM | POA: Diagnosis not present

## 2021-10-25 DIAGNOSIS — I340 Nonrheumatic mitral (valve) insufficiency: Secondary | ICD-10-CM | POA: Diagnosis not present

## 2021-10-25 DIAGNOSIS — I110 Hypertensive heart disease with heart failure: Secondary | ICD-10-CM | POA: Diagnosis present

## 2021-10-25 DIAGNOSIS — Z8249 Family history of ischemic heart disease and other diseases of the circulatory system: Secondary | ICD-10-CM | POA: Diagnosis not present

## 2021-10-25 DIAGNOSIS — I272 Pulmonary hypertension, unspecified: Secondary | ICD-10-CM | POA: Diagnosis present

## 2021-10-25 DIAGNOSIS — I088 Other rheumatic multiple valve diseases: Secondary | ICD-10-CM | POA: Diagnosis not present

## 2021-10-25 DIAGNOSIS — Z79899 Other long term (current) drug therapy: Secondary | ICD-10-CM | POA: Diagnosis not present

## 2021-10-25 DIAGNOSIS — E8779 Other fluid overload: Secondary | ICD-10-CM | POA: Diagnosis not present

## 2021-10-25 DIAGNOSIS — E785 Hyperlipidemia, unspecified: Secondary | ICD-10-CM | POA: Diagnosis present

## 2021-10-25 DIAGNOSIS — M1990 Unspecified osteoarthritis, unspecified site: Secondary | ICD-10-CM | POA: Diagnosis not present

## 2021-10-25 DIAGNOSIS — I4891 Unspecified atrial fibrillation: Secondary | ICD-10-CM | POA: Diagnosis present

## 2021-10-25 DIAGNOSIS — Z7901 Long term (current) use of anticoagulants: Secondary | ICD-10-CM | POA: Diagnosis not present

## 2021-10-25 DIAGNOSIS — E876 Hypokalemia: Secondary | ICD-10-CM | POA: Diagnosis not present

## 2021-10-25 DIAGNOSIS — Z880 Allergy status to penicillin: Secondary | ICD-10-CM | POA: Diagnosis not present

## 2021-10-25 DIAGNOSIS — I700 Atherosclerosis of aorta: Secondary | ICD-10-CM | POA: Diagnosis not present

## 2021-10-25 HISTORY — PX: TRANSTHORACIC ECHOCARDIOGRAM: SHX275

## 2021-10-25 LAB — CBC
HCT: 40.1 % (ref 36.0–46.0)
Hemoglobin: 12.9 g/dL (ref 12.0–15.0)
MCH: 31.2 pg (ref 26.0–34.0)
MCHC: 32.2 g/dL (ref 30.0–36.0)
MCV: 97.1 fL (ref 80.0–100.0)
Platelets: 226 10*3/uL (ref 150–400)
RBC: 4.13 MIL/uL (ref 3.87–5.11)
RDW: 13.4 % (ref 11.5–15.5)
WBC: 9.6 10*3/uL (ref 4.0–10.5)
nRBC: 0 % (ref 0.0–0.2)

## 2021-10-25 LAB — BASIC METABOLIC PANEL
Anion gap: 10 (ref 5–15)
BUN: 23 mg/dL (ref 8–23)
CO2: 21 mmol/L — ABNORMAL LOW (ref 22–32)
Calcium: 9.3 mg/dL (ref 8.9–10.3)
Chloride: 108 mmol/L (ref 98–111)
Creatinine, Ser: 1.15 mg/dL — ABNORMAL HIGH (ref 0.44–1.00)
GFR, Estimated: 45 mL/min — ABNORMAL LOW (ref >60)
Glucose, Bld: 111 mg/dL — ABNORMAL HIGH (ref 70–99)
Potassium: 4 mmol/L (ref 3.5–5.1)
Sodium: 139 mmol/L (ref 135–145)

## 2021-10-25 LAB — ECHOCARDIOGRAM COMPLETE: S' Lateral: 2.1 cm

## 2021-10-25 MED ORDER — FUROSEMIDE 10 MG/ML IJ SOLN
20.0000 mg | Freq: Once | INTRAMUSCULAR | Status: AC
  Administered 2021-10-25: 20 mg via INTRAVENOUS
  Filled 2021-10-25: qty 2

## 2021-10-25 MED ORDER — DILTIAZEM HCL 60 MG PO TABS
30.0000 mg | ORAL_TABLET | Freq: Four times a day (QID) | ORAL | Status: DC
  Administered 2021-10-25 – 2021-10-26 (×5): 30 mg via ORAL
  Filled 2021-10-25 (×5): qty 1

## 2021-10-25 NOTE — Progress Notes (Signed)
  Echocardiogram 2D Echocardiogram has been performed.  Lisa Lang 10/25/2021, 5:42 PM

## 2021-10-25 NOTE — Progress Notes (Signed)
Pt's O2 saturation was sustaining at 85%, placed pt on 2LNC; O2 saturation improved to 92%, will continue to monitor.  Elaina Hoops, RN

## 2021-10-25 NOTE — Progress Notes (Signed)
Progress Note  Patient Name: Lisa Lang Date of Encounter: 10/25/2021  Primary Cardiologist: Glenetta Hew, MD  Subjective   Patient desaturated to 80s last night associated with shortness of breath while walking to the bathroom. However patient denied it, she reported cough for the last 1 to 2 days along with palpitations.  During my interview, her O2 saturation is at 89-92% on 2 L of oxygen, has no SOB but has cough. Denies any palpitations.  Inpatient Medications    Scheduled Meds:  apixaban  2.5 mg Oral BID   atorvastatin  40 mg Oral QHS   diltiazem  30 mg Oral Q6H   furosemide  20 mg Intravenous Once   Continuous Infusions:  diltiazem (CARDIZEM) infusion 10 mg/hr (10/25/21 0103)   PRN Meds: acetaminophen, ondansetron (ZOFRAN) IV   Vital Signs    Vitals:   10/24/21 1748 10/24/21 2111 10/25/21 0041 10/25/21 0425  BP:  109/61 (!) 110/54 (!) 102/46  Pulse: (!) 110 76 96 (!) 135  Resp:  '16 16 18  '$ Temp:  97.8 F (36.6 C) 98 F (36.7 C) 98.2 F (36.8 C)  TempSrc:  Oral Oral Oral  SpO2: 97% 91%  91%    Intake/Output Summary (Last 24 hours) at 10/25/2021 0758 Last data filed at 10/24/2021 1748 Gross per 24 hour  Intake 35.99 ml  Output --  Net 35.99 ml   There were no vitals filed for this visit.  Telemetry     Personally reviewed, A-fib and HR controlled  ECG    An ECG dated 10/24/2021 was personally reviewed today and demonstrated:  A-fib with RVR and right bundle branch block  Physical Exam   GEN: No acute distress.   Neck:JVD. Cardiac: RRR, no murmur, rub, or gallop.  Respiratory: Nonlabored. Clear to auscultation bilaterally. GI: Soft, nontender, bowel sounds present. MS: No edema; No deformity. Neuro:  Nonfocal. Psych: Alert and oriented x 3. Normal affect.  Labs    Chemistry Recent Labs  Lab 10/24/21 1105 10/25/21 0141  NA 141 139  K 4.3 4.0  CL 108 108  CO2 24 21*  GLUCOSE 144* 111*  BUN 20 23  CREATININE 1.17* 1.15*   CALCIUM 9.5 9.3  PROT 6.5  --   ALBUMIN 3.4*  --   AST 43*  --   ALT 44  --   ALKPHOS 145*  --   BILITOT 1.2  --   GFRNONAA 44* 45*  ANIONGAP 9 10     Hematology Recent Labs  Lab 10/24/21 1105 10/25/21 0141  WBC 8.5 9.6  RBC 4.41 4.13  HGB 14.1 12.9  HCT 42.9 40.1  MCV 97.3 97.1  MCH 32.0 31.2  MCHC 32.9 32.2  RDW 13.2 13.4  PLT 245 226    Cardiac Enzymes Recent Labs  Lab 10/24/21 1105 10/24/21 1308  TROPONINIHS 21* 15    BNPNo results for input(s): "BNP", "PROBNP" in the last 168 hours.   DDimerNo results for input(s): "DDIMER" in the last 168 hours.   Radiology    DG Chest 2 View  Result Date: 10/24/2021 CLINICAL DATA:  Shortness of breath. EXAM: CHEST - 2 VIEW COMPARISON:  09/26/2017 chest radiograph FINDINGS: Cardiomegaly noted. Mild bibasilar opacities/atelectasis noted as well as a trace RIGHT pleural effusion. There is no evidence of pneumothorax. No acute bony abnormalities are present. IMPRESSION: 1. Mild bibasilar opacities/atelectasis and trace RIGHT pleural effusion. Pneumonia is not excluded. 2. Cardiomegaly. Electronically Signed   By: Margarette Canada M.D.   On:  10/24/2021 11:45    Cardiac Studies  Echo in 2022 LVEF preserved Severe bileaflet mitral valve prolapse with moderate to severe mitral regurgitation  Assessment & Plan    Patient is 88 year old F known to have bileaflet mitral valve prolapse with moderate to severe MR, A-fib on AC, HLD presented to the ER with palpitations and cough. Found to be in A-fib with RVR.   #Atrial fibrillation with RVR, currently rate controlled Plan -Start p.o. diltiazem 30 mg every 6 hours and wean off the Cardizem drip.  Continue home dose of metoprolol tartrate 12.5 mg twice daily. -Continue Eliquis 2.5 mg twice daily  #Bileaflet mitral valve prolapse with moderate to severe MR per 2022 echo #CHF, HFpEF  Plan -Obtain 2D echo -IV Lasix 20 mg one-time dose today as patient desaturated last night with  walking and has SOB per nurse although patient denied it. During my interview today, patient is oxygen saturation was 89-92% on 2 L of nasal cannula oxygen. We will reassess this afternoon.  #HLD Plan -LDL 162 in 03/24/2018.  Continue atorvastatin 40 mg nightly  I have spent a total of 37 minutes with patient reviewing chart , telemetry, EKGs, labs and examining patient as well as establishing an assessment and plan that was discussed with the patient.  > 50% of time was spent in direct patient care.     Signed, Chalmers Guest, MD  10/25/2021, 7:58 AM

## 2021-10-26 DIAGNOSIS — I340 Nonrheumatic mitral (valve) insufficiency: Secondary | ICD-10-CM

## 2021-10-26 DIAGNOSIS — I4891 Unspecified atrial fibrillation: Secondary | ICD-10-CM | POA: Diagnosis not present

## 2021-10-26 DIAGNOSIS — I341 Nonrheumatic mitral (valve) prolapse: Secondary | ICD-10-CM | POA: Diagnosis not present

## 2021-10-26 LAB — BASIC METABOLIC PANEL
Anion gap: 10 (ref 5–15)
BUN: 20 mg/dL (ref 8–23)
CO2: 24 mmol/L (ref 22–32)
Calcium: 8.7 mg/dL — ABNORMAL LOW (ref 8.9–10.3)
Chloride: 104 mmol/L (ref 98–111)
Creatinine, Ser: 1.09 mg/dL — ABNORMAL HIGH (ref 0.44–1.00)
GFR, Estimated: 48 mL/min — ABNORMAL LOW (ref >60)
Glucose, Bld: 115 mg/dL — ABNORMAL HIGH (ref 70–99)
Potassium: 3.6 mmol/L (ref 3.5–5.1)
Sodium: 138 mmol/L (ref 135–145)

## 2021-10-26 LAB — CBC
HCT: 38 % (ref 36.0–46.0)
Hemoglobin: 12.8 g/dL (ref 12.0–15.0)
MCH: 31.8 pg (ref 26.0–34.0)
MCHC: 33.7 g/dL (ref 30.0–36.0)
MCV: 94.3 fL (ref 80.0–100.0)
Platelets: 212 10*3/uL (ref 150–400)
RBC: 4.03 MIL/uL (ref 3.87–5.11)
RDW: 13.3 % (ref 11.5–15.5)
WBC: 10.5 10*3/uL (ref 4.0–10.5)
nRBC: 0 % (ref 0.0–0.2)

## 2021-10-26 MED ORDER — METOPROLOL TARTRATE 12.5 MG HALF TABLET
12.5000 mg | ORAL_TABLET | Freq: Two times a day (BID) | ORAL | Status: DC
  Administered 2021-10-26 – 2021-10-29 (×7): 12.5 mg via ORAL
  Filled 2021-10-26 (×8): qty 1

## 2021-10-26 MED ORDER — AMIODARONE HCL IN DEXTROSE 360-4.14 MG/200ML-% IV SOLN
30.0000 mg/h | INTRAVENOUS | Status: AC
  Administered 2021-10-27 (×3): 30 mg/h via INTRAVENOUS
  Filled 2021-10-26 (×4): qty 200

## 2021-10-26 MED ORDER — POTASSIUM CHLORIDE CRYS ER 20 MEQ PO TBCR
40.0000 meq | EXTENDED_RELEASE_TABLET | Freq: Once | ORAL | Status: AC
  Administered 2021-10-26: 40 meq via ORAL
  Filled 2021-10-26: qty 2

## 2021-10-26 MED ORDER — DILTIAZEM HCL ER COATED BEADS 120 MG PO CP24: 120.0000 mg | ORAL_CAPSULE | Freq: Every day | ORAL | Status: DC

## 2021-10-26 MED ORDER — FUROSEMIDE 10 MG/ML IJ SOLN
40.0000 mg | Freq: Once | INTRAMUSCULAR | Status: AC
  Administered 2021-10-26: 40 mg via INTRAVENOUS
  Filled 2021-10-26: qty 4

## 2021-10-26 MED ORDER — AMIODARONE LOAD VIA INFUSION
150.0000 mg | Freq: Once | INTRAVENOUS | Status: AC
  Administered 2021-10-26: 150 mg via INTRAVENOUS
  Filled 2021-10-26: qty 83.34

## 2021-10-26 MED ORDER — AMIODARONE HCL IN DEXTROSE 360-4.14 MG/200ML-% IV SOLN
60.0000 mg/h | INTRAVENOUS | Status: AC
  Administered 2021-10-26 (×2): 60 mg/h via INTRAVENOUS
  Filled 2021-10-26 (×2): qty 200

## 2021-10-26 NOTE — Progress Notes (Signed)
Mobility Specialist - Progress Note   10/26/21 1126  Mobility  Activity Ambulated with assistance in room  Level of Assistance Standby assist, set-up cues, supervision of patient - no hands on  Assistive Device None  Distance Ambulated (ft) 20 ft  Activity Response Tolerated well  Mobility Referral Yes  $Mobility charge 1 Mobility   Pt received in BR and requesting help back to bed. Pt declined further ambulation d/t wanting to rest. Pt was left in bed with all needs met.    Larey Seat

## 2021-10-26 NOTE — Progress Notes (Addendum)
Rounding Note    Patient Name: Lisa Lang Date of Encounter: 10/26/2021  [place] Cardiologist: Glenetta Hew, MD   Subjective   Still short of breath this morning, remains in afib, rates 100s.   Inpatient Medications    Scheduled Meds:  apixaban  2.5 mg Oral BID   atorvastatin  40 mg Oral QHS   diltiazem  30 mg Oral Q6H   Continuous Infusions:  diltiazem (CARDIZEM) infusion Stopped (10/25/21 1135)   PRN Meds: acetaminophen, ondansetron (ZOFRAN) IV   Vital Signs    Vitals:   10/26/21 0004 10/26/21 0045 10/26/21 0055 10/26/21 0618  BP: 118/70   (!) 104/58  Pulse: (!) 128 100 91 92  Resp: 18   15  Temp: 98 F (36.7 C)   98.8 F (37.1 C)  TempSrc: Oral   Oral  SpO2: 95% 91% 91% 91%    Intake/Output Summary (Last 24 hours) at 10/26/2021 0824 Last data filed at 10/26/2021 0655 Gross per 24 hour  Intake 1076 ml  Output --  Net 1076 ml      01/13/2021    4:48 PM 07/08/2020    3:01 PM 01/14/2020   11:59 AM  Last 3 Weights  Weight (lbs) 112 lb 12.8 oz 113 lb 110 lb 12.8 oz  Weight (kg) 51.166 kg 51.256 kg 50.259 kg      Telemetry    Afib rates 100-110s - Personally Reviewed  ECG    No new tracing this morning  Physical Exam   GEN: Very pleasant older female, No acute distress, remains on [place] '@2L'$  Neck: No JVD Cardiac: Irreg Irreg, 3/6 systolic murmur, no rubs, or gallops.  Respiratory: Crackles in bases bilaterally GI: Soft, nontender, non-distended  MS: No edema; No deformity. Neuro:  Nonfocal  Psych: Normal affect   Labs    High Sensitivity Troponin:   Recent Labs  Lab 10/24/21 1105 10/24/21 1308  TROPONINIHS 21* 15     Chemistry Recent Labs  Lab 10/24/21 1105 10/25/21 0141 10/26/21 0202  NA 141 139 138  K 4.3 4.0 3.6  CL 108 108 104  CO2 24 21* 24  GLUCOSE 144* 111* 115*  BUN '20 23 20  '$ CREATININE 1.17* 1.15* 1.09*  CALCIUM 9.5 9.3 8.7*  PROT 6.5  --   --   ALBUMIN 3.4*  --   --   AST 43*  --   --   ALT 44   --   --   ALKPHOS 145*  --   --   BILITOT 1.2  --   --   GFRNONAA 44* 45* 48*  ANIONGAP '9 10 10    '$ Lipids No results for input(s): "CHOL", "TRIG", "HDL", "LABVLDL", "LDLCALC", "CHOLHDL" in the last 168 hours.  Hematology Recent Labs  Lab 10/24/21 1105 10/25/21 0141 10/26/21 0202  WBC 8.5 9.6 10.5  RBC 4.41 4.13 4.03  HGB 14.1 12.9 12.8  HCT 42.9 40.1 38.0  MCV 97.3 97.1 94.3  MCH 32.0 31.2 31.8  MCHC 32.9 32.2 33.7  RDW 13.2 13.4 13.3  PLT 245 226 212   Thyroid  Recent Labs  Lab 10/24/21 1308  TSH 3.100    BNPNo results for input(s): "BNP", "PROBNP" in the last 168 hours.  DDimer No results for input(s): "DDIMER" in the last 168 hours.   Radiology    ECHOCARDIOGRAM COMPLETE  Result Date: 10/25/2021    ECHOCARDIOGRAM REPORT   Patient Name:   Lisa Lang Date of Exam: 10/25/2021 Medical Rec #:  [id number]     Height:       62.0 in Accession #:    [id number]    Weight:       112.8 lb Date of Birth:  02/25/1927      BSA:          1.499 m Patient Age:    5 years      BP:           121/91 mmHg Patient Gender: F             HR:           112 bpm. Exam Location:  Inpatient Procedure: 2D Echo Indications:    mitral regurgitation  History:        Patient has prior history of Echocardiogram examinations, most                 recent 12/22/2020. Mitral Valve Prolapse, Arrythmias:Atrial                 Fibrillation; Risk Factors:Dyslipidemia.  Sonographer:    Johny Chess RDCS Referring Phys: [id number] VISHNU P MALLIPEDDI IMPRESSIONS  1. Left ventricular ejection fraction, by estimation, is 65 to 70%. The left ventricle has normal function. The left ventricle has no regional wall motion abnormalities. Left ventricular diastolic function could not be evaluated.  2. Right ventricular systolic function is normal. The right ventricular size is normal. There is moderately elevated pulmonary artery systolic pressure. The estimated right ventricular systolic pressure is 72.5 mmHg.  3. Left atrial  size was moderately dilated.  4. Right atrial size was moderately dilated.  5. 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 The posterior leaflet is dimunitive and there is at least moderate posterior MR. The mitral valve is myxomatous. Moderate mitral valve regurgitation. There is severe prolapse of both leaflets of the mitral valve. Moderate mitral annular calcification.  6. Tricuspid valve regurgitation is moderate.  7. The aortic valve is tricuspid. There is moderate calcification of the aortic valve. Aortic valve regurgitation is mild to moderate. Aortic valve sclerosis/calcification is present, without any evidence of aortic stenosis. FINDINGS  Left Ventricle: Left ventricular ejection fraction, by estimation, is 65 to 70%. The left ventricle has normal function. The left ventricle has no regional wall motion abnormalities. The left ventricular internal cavity size was normal in size. There is  no left ventricular hypertrophy. Left ventricular diastolic function could not be evaluated due to atrial fibrillation. Left ventricular diastolic function could not be evaluated. Right Ventricle: The right ventricular size is normal. No increase in right ventricular wall thickness. Right ventricular systolic function is normal. There is moderately elevated pulmonary artery systolic pressure. The tricuspid regurgitant velocity is 3.63 m/s, and with an assumed right atrial pressure of 5 mmHg, the estimated right ventricular systolic pressure is 36.6 mmHg. Left Atrium: Left atrial size was moderately dilated. Right Atrium: Right atrial size was moderately dilated. Pericardium: There is no evidence of pericardial effusion. 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 The posterior leaflet is dimunitive and there is at least moderate posterior MR. 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 Moderate mitral annular calcification.  Moderate mitral valve regurgitation, with posteriorly-directed jet. Tricuspid Valve: The tricuspid valve is normal in structure. Tricuspid valve regurgitation is moderate. Aortic Valve: The aortic valve is tricuspid. There is moderate calcification of the aortic valve. Aortic valve regurgitation is mild to moderate. Aortic  valve sclerosis/calcification is present, without any evidence of aortic stenosis. Pulmonic Valve: The pulmonic valve was normal in structure. Pulmonic valve regurgitation is trivial. Aorta: The aortic root and ascending aorta are structurally normal, with no evidence of dilitation. IAS/Shunts: No atrial level shunt detected by color flow Doppler.  LEFT VENTRICLE PLAX 2D LVIDd:         3.80 cm LVIDs:         2.10 cm LV PW:         0.80 cm LV IVS:        0.90 cm LVOT diam:     2.00 cm LVOT Area:     3.14 cm  RIGHT VENTRICLE         IVC TAPSE (M-mode): 1.5 cm  IVC diam: 2.00 cm LEFT ATRIUM             Index        RIGHT ATRIUM           Index LA diam:        3.80 cm 2.54 cm/m   RA Area:     17.50 cm LA Vol (A2C):   79.6 ml 53.11 ml/m  RA Volume:   48.90 ml  32.63 ml/m LA Vol (A4C):   79.1 ml 52.78 ml/m LA Biplane Vol: 82.2 ml 54.85 ml/m   AORTA Ao Root diam: 2.90 cm Ao Asc diam:  3.10 cm TRICUSPID VALVE TR Peak grad:   52.7 mmHg TR Vmax:        363.00 cm/s  SHUNTS Systemic Diam: 2.00 cm Glori Bickers MD Electronically signed by Glori Bickers MD Signature Date/Time: 10/25/2021/5:56:23 PM    Final    DG Chest 2 View  Result Date: 10/24/2021 CLINICAL DATA:  Shortness of breath. EXAM: CHEST - 2 VIEW COMPARISON:  09/26/2017 chest radiograph FINDINGS: Cardiomegaly noted. Mild bibasilar opacities/atelectasis noted as well as a trace RIGHT pleural effusion. There is no evidence of pneumothorax. No acute bony abnormalities are present. IMPRESSION: 1. Mild bibasilar opacities/atelectasis and trace RIGHT pleural effusion. Pneumonia is not excluded. 2. Cardiomegaly. Electronically Signed   By:  Margarette Canada M.D.   On: 10/24/2021 11:45    Cardiac Studies   Echo: 10/25/21  IMPRESSIONS     1. Left ventricular ejection fraction, by estimation, is 65 to 70%. The  left ventricle has normal function. The left ventricle has no regional  wall motion abnormalities. Left ventricular diastolic function could not  be evaluated.   2. Right ventricular systolic function is normal. The right ventricular  size is normal. There is moderately elevated pulmonary artery systolic  pressure. The estimated right ventricular systolic pressure is 87.6 mmHg.   3. Left atrial size was moderately dilated.   4. Right atrial size was moderately dilated.   5. 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 The posterior  leaflet is dimunitive and there is at least moderate posterior MR. The  mitral valve is myxomatous. Moderate mitral valve regurgitation. There is  severe prolapse of both leaflets  of the mitral valve. Moderate mitral annular calcification.   6. Tricuspid valve regurgitation is moderate.   7. The aortic valve is tricuspid. There is moderate calcification of the  aortic valve. Aortic valve regurgitation is mild to moderate. Aortic valve  sclerosis/calcification is present, without any evidence of aortic  stenosis.   FINDINGS   Left Ventricle: Left ventricular ejection fraction, by estimation, is 65  to 70%. The left ventricle has normal function. The left  ventricle has no  regional wall motion abnormalities. The left ventricular internal cavity  size was normal in size. There is   no left ventricular hypertrophy. Left ventricular diastolic function  could not be evaluated due to atrial fibrillation. Left ventricular  diastolic function could not be evaluated.   Right Ventricle: The right ventricular size is normal. No increase in  right ventricular wall thickness. Right ventricular systolic function is  normal. There is moderately elevated pulmonary artery systolic pressure.   The tricuspid regurgitant velocity is  3.63 m/s, and with an assumed right atrial pressure of 5 mmHg, the  estimated right ventricular systolic pressure is 02.7 mmHg.   Left Atrium: Left atrial size was moderately dilated.   Right Atrium: Right atrial size was moderately dilated.   Pericardium: There is no evidence of pericardial effusion.   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 The  posterior leaflet is dimunitive and there is at least moderate posterior  MR. The mitral valve is myxomatous. There is severe prolapse of both  leaflets of the mitral valve.  Moderate mitral annular calcification. Moderate mitral valve  regurgitation, with posteriorly-directed jet.   Tricuspid Valve: The tricuspid valve is normal in structure. Tricuspid  valve regurgitation is moderate.   Aortic Valve: The aortic valve is tricuspid. There is moderate  calcification of the aortic valve. Aortic valve regurgitation is mild to  moderate. Aortic valve sclerosis/calcification is present, without any  evidence of aortic stenosis.   Pulmonic Valve: The pulmonic valve was normal in structure. Pulmonic valve  regurgitation is trivial.   Aorta: The aortic root and ascending aorta are structurally normal, with  no evidence of dilitation.   IAS/Shunts: No atrial level shunt detected by color flow Doppler.   Patient Profile     88 y.o. 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  -- received IV lasix, I&O incomplete, no daily weights. Remains on [place] '@2L'$  -- IV lasix '40mg'$  x1 -- discussed further work up of valve with TEE, potential cardioversion with patient. She is agreeable. Will review with MD  HLD -- on atorvastatin '40mg'$  daily    For questions or updates, please contact [place] Please consult [email or web address] for contact info under        Signed, Reino Bellis, NP  10/26/2021, 8:24 AM     Patient seen and examined. Agree with assessment and plan. I/O + 1112.  Patient feels well, however she is back with RVR with reference to atrial fibrillation with ventricular rates now approximately 145.  Apparently her IV diltiazem was discontinued and he had been switched to low-dose oral diltiazem.  She had been on a blocker but apparently is not taking this presently.  Will initiate low-dose metoprolol tartrate at 12.5 mg twice a day and recommended initiation of amiodarone bolus plus infusion for rate stabilization and possible cardioversion.  Patient has been on Eliquis for over 5 years.  If  she does not convert should make plans for cardioversion later this week.  May be good candidate for mitral clip procedure with her cell with his mitral valve bileaflet prolapse with moderate MR. F/U labs for monitoring.   Troy Sine, MD, [facility] 10/26/2021 11:59 AM

## 2021-10-26 NOTE — Progress Notes (Signed)
Mobility Specialist - Progress Note   10/26/21 1357  Mobility  Activity Ambulated with assistance in room  Level of Assistance Standby assist, set-up cues, supervision of patient - no hands on  Assistive Device None  Distance Ambulated (ft) 20 ft  Activity Response Tolerated well  Mobility Referral Yes  $Mobility charge 1 Mobility   Pt received in bed requesting to use BR. No complaints throughout. Pt returned to bed with all needs met.   Larey Seat

## 2021-10-26 NOTE — Progress Notes (Signed)
  Transition of Care [facility]) Screening Note   Patient Details  Name: Lisa Lang Date of Birth: Mar 28, 1927   Transition of Care [facility]) CM/SW Contact:    Milas Gain, [place] Phone Number: 10/26/2021, 4:54 PM    Transition of Care Department [facility]) has reviewed patient and no TOC needs have been identified at this time. We will continue to monitor patient advancement through interdisciplinary progression rounds. If new patient transition needs arise, please place a TOC consult.

## 2021-10-27 DIAGNOSIS — E876 Hypokalemia: Secondary | ICD-10-CM | POA: Diagnosis not present

## 2021-10-27 DIAGNOSIS — I341 Nonrheumatic mitral (valve) prolapse: Secondary | ICD-10-CM | POA: Diagnosis not present

## 2021-10-27 DIAGNOSIS — I4891 Unspecified atrial fibrillation: Secondary | ICD-10-CM | POA: Diagnosis not present

## 2021-10-27 DIAGNOSIS — E8779 Other fluid overload: Secondary | ICD-10-CM

## 2021-10-27 DIAGNOSIS — I340 Nonrheumatic mitral (valve) insufficiency: Secondary | ICD-10-CM | POA: Diagnosis not present

## 2021-10-27 LAB — BASIC METABOLIC PANEL
Anion gap: 12 (ref 5–15)
BUN: 15 mg/dL (ref 8–23)
CO2: 23 mmol/L (ref 22–32)
Calcium: 8.7 mg/dL — ABNORMAL LOW (ref 8.9–10.3)
Chloride: 102 mmol/L (ref 98–111)
Creatinine, Ser: 1.02 mg/dL — ABNORMAL HIGH (ref 0.44–1.00)
GFR, Estimated: 52 mL/min — ABNORMAL LOW (ref >60)
Glucose, Bld: 118 mg/dL — ABNORMAL HIGH (ref 70–99)
Potassium: 3.7 mmol/L (ref 3.5–5.1)
Sodium: 137 mmol/L (ref 135–145)

## 2021-10-27 LAB — CBC
HCT: 40.3 % (ref 36.0–46.0)
Hemoglobin: 13.2 g/dL (ref 12.0–15.0)
MCH: 31.3 pg (ref 26.0–34.0)
MCHC: 32.8 g/dL (ref 30.0–36.0)
MCV: 95.5 fL (ref 80.0–100.0)
Platelets: 232 10*3/uL (ref 150–400)
RBC: 4.22 MIL/uL (ref 3.87–5.11)
RDW: 13.4 % (ref 11.5–15.5)
WBC: 10.5 10*3/uL (ref 4.0–10.5)
nRBC: 0 % (ref 0.0–0.2)

## 2021-10-27 LAB — MAGNESIUM: Magnesium: 1.9 mg/dL (ref 1.7–2.4)

## 2021-10-27 LAB — BRAIN NATRIURETIC PEPTIDE: B Natriuretic Peptide: 470.8 pg/mL — ABNORMAL HIGH (ref 0.0–100.0)

## 2021-10-27 MED ORDER — FUROSEMIDE 10 MG/ML IJ SOLN
20.0000 mg | Freq: Once | INTRAMUSCULAR | Status: AC
  Administered 2021-10-27: 20 mg via INTRAVENOUS
  Filled 2021-10-27: qty 2

## 2021-10-27 MED ORDER — POTASSIUM CHLORIDE CRYS ER 20 MEQ PO TBCR
40.0000 meq | EXTENDED_RELEASE_TABLET | Freq: Once | ORAL | Status: AC
  Administered 2021-10-27: 40 meq via ORAL
  Filled 2021-10-27: qty 2

## 2021-10-27 MED ORDER — AMIODARONE LOAD VIA INFUSION
150.0000 mg | Freq: Once | INTRAVENOUS | Status: AC
  Administered 2021-10-27: 150 mg via INTRAVENOUS
  Filled 2021-10-27: qty 83.34

## 2021-10-27 NOTE — Progress Notes (Signed)
LFA IV infiltrated running amiodarone. New IV placed in RFA. Amio restarted. Amio paused from 1153 to 1237.

## 2021-10-27 NOTE — Progress Notes (Addendum)
Is a significant area found something on his echo or something with a TEE that did not get better  Rounding Note    Patient Name: Lisa Lang Date of Encounter: 10/27/2021  [place] Cardiologist: Glenetta Hew, MD   Subjective   Reports feeling better this morning, breathing has improved. Still on O2 this morning '@2L'$ .   Inpatient Medications    Scheduled Meds:  apixaban  2.5 mg Oral BID   atorvastatin  40 mg Oral QHS   metoprolol tartrate  12.5 mg Oral BID   Continuous Infusions:  amiodarone 30 mg/hr (10/27/21 0356)   PRN Meds: acetaminophen, ondansetron (ZOFRAN) IV   Vital Signs    Vitals:   10/26/21 1321 10/26/21 2009 10/27/21 0026 10/27/21 0331  BP: 103/68 111/68 104/70 117/73  Pulse: (!) 109 (!) 113 100 (!) 111  Resp: '18 18 18 18  '$ Temp: 98.1 F (36.7 C) 98.2 F (36.8 C) 98.6 F (37 C) 97.9 F (36.6 C)  TempSrc: Oral Oral Oral Oral  SpO2:  93% 91% 91%    Intake/Output Summary (Last 24 hours) at 10/27/2021 0807 Last data filed at 10/27/2021 8850 Gross per 24 hour  Intake 822.87 ml  Output 1100 ml  Net -277.13 ml      01/13/2021    4:48 PM 07/08/2020    3:01 PM 01/14/2020   11:59 AM  Last 3 Weights  Weight (lbs) 112 lb 12.8 oz 113 lb 110 lb 12.8 oz  Weight (kg) 51.166 kg 51.256 kg 50.259 kg      Telemetry    Afib RVR rates 120s - Personally Reviewed  ECG    Afib rate 108 - Personally Reviewed  Physical Exam   GEN: No acute distress.   Neck: No JVD Cardiac: Irreg Irreg, 3/6 systolic murmur, no rubs, or gallops.  Respiratory: Clear to auscultation bilaterally. GI: Soft, nontender, non-distended  MS: No edema; No deformity. Neuro:  Nonfocal  Psych: Normal affect   Labs    High Sensitivity Troponin:   Recent Labs  Lab 10/24/21 1105 10/24/21 1308  TROPONINIHS 21* 15     Chemistry Recent Labs  Lab 10/24/21 1105 10/25/21 0141 10/26/21 0202 10/27/21 0251  NA 141 139 138 137  K 4.3 4.0 3.6 3.7  CL 108 108 104 102   CO2 24 21* 24 23  GLUCOSE 144* 111* 115* 118*  BUN '20 23 20 15  '$ CREATININE 1.17* 1.15* 1.09* 1.02*  CALCIUM 9.5 9.3 8.7* 8.7*  PROT 6.5  --   --   --   ALBUMIN 3.4*  --   --   --   AST 43*  --   --   --   ALT 44  --   --   --   ALKPHOS 145*  --   --   --   BILITOT 1.2  --   --   --   GFRNONAA 44* 45* 48* 52*  ANIONGAP '9 10 10 12    '$ Lipids No results for input(s): "CHOL", "TRIG", "HDL", "LABVLDL", "LDLCALC", "CHOLHDL" in the last 168 hours.  Hematology Recent Labs  Lab 10/25/21 0141 10/26/21 0202 10/27/21 0251  WBC 9.6 10.5 10.5  RBC 4.13 4.03 4.22  HGB 12.9 12.8 13.2  HCT 40.1 38.0 40.3  MCV 97.1 94.3 95.5  MCH 31.2 31.8 31.3  MCHC 32.2 33.7 32.8  RDW 13.4 13.3 13.4  PLT 226 212 232   Thyroid  Recent Labs  Lab 10/24/21 1308  TSH 3.100  BNPNo results for input(s): "BNP", "PROBNP" in the last 168 hours.  DDimer No results for input(s): "DDIMER" in the last 168 hours.   Radiology    ECHOCARDIOGRAM COMPLETE  Result Date: 10/25/2021    ECHOCARDIOGRAM REPORT   Patient Name:   Lisa Lang Date of Exam: 10/25/2021 Medical Rec #:  [id number]     Height:       62.0 in Accession #:    [id number]    Weight:       112.8 lb Date of Birth:  October 30, 1927      BSA:          1.499 m Patient Age:    88 years      BP:           121/91 mmHg Patient Gender: F             HR:           112 bpm. Exam Location:  Inpatient Procedure: 2D Echo Indications:    mitral regurgitation  History:        Patient has prior history of Echocardiogram examinations, most                 recent 12/22/2020. Mitral Valve Prolapse, Arrythmias:Atrial                 Fibrillation; Risk Factors:Dyslipidemia.  Sonographer:    Johny Chess RDCS Referring Phys: [id number] VISHNU P MALLIPEDDI IMPRESSIONS  1. Left ventricular ejection fraction, by estimation, is 65 to 70%. The left ventricle has normal function. The left ventricle has no regional wall motion abnormalities. Left ventricular diastolic function could not  be evaluated.  2. Right ventricular systolic function is normal. The right ventricular size is normal. There is moderately elevated pulmonary artery systolic pressure. The estimated right ventricular systolic pressure is 42.5 mmHg.  3. Left atrial size was moderately dilated.  4. Right atrial size was moderately dilated.  5. 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 The posterior leaflet is dimunitive and there is at least moderate posterior MR. The mitral valve is myxomatous. Moderate mitral valve regurgitation. There is severe prolapse of both leaflets of the mitral valve. Moderate mitral annular calcification.  6. Tricuspid valve regurgitation is moderate.  7. The aortic valve is tricuspid. There is moderate calcification of the aortic valve. Aortic valve regurgitation is mild to moderate. Aortic valve sclerosis/calcification is present, without any evidence of aortic stenosis. FINDINGS  Left Ventricle: Left ventricular ejection fraction, by estimation, is 65 to 70%. The left ventricle has normal function. The left ventricle has no regional wall motion abnormalities. The left ventricular internal cavity size was normal in size. There is  no left ventricular hypertrophy. Left ventricular diastolic function could not be evaluated due to atrial fibrillation. Left ventricular diastolic function could not be evaluated. Right Ventricle: The right ventricular size is normal. No increase in right ventricular wall thickness. Right ventricular systolic function is normal. There is moderately elevated pulmonary artery systolic pressure. The tricuspid regurgitant velocity is 3.63 m/s, and with an assumed right atrial pressure of 5 mmHg, the estimated right ventricular systolic pressure is 95.6 mmHg. Left Atrium: Left atrial size was moderately dilated. Right Atrium: Right atrial size was moderately dilated. Pericardium: There is no evidence of pericardial effusion. 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 The posterior leaflet is dimunitive and there is at least moderate posterior MR. The mitral valve is myxomatous. There is  severe prolapse of both leaflets of the mitral valve. Moderate mitral annular calcification. Moderate mitral valve regurgitation, with posteriorly-directed jet. Tricuspid Valve: The tricuspid valve is normal in structure. Tricuspid valve regurgitation is moderate. Aortic Valve: The aortic valve is tricuspid. There is moderate calcification of the aortic valve. Aortic valve regurgitation is mild to moderate. Aortic valve sclerosis/calcification is present, without any evidence of aortic stenosis. Pulmonic Valve: The pulmonic valve was normal in structure. Pulmonic valve regurgitation is trivial. Aorta: The aortic root and ascending aorta are structurally normal, with no evidence of dilitation. IAS/Shunts: No atrial level shunt detected by color flow Doppler.  LEFT VENTRICLE PLAX 2D LVIDd:         3.80 cm LVIDs:         2.10 cm LV PW:         0.80 cm LV IVS:        0.90 cm LVOT diam:     2.00 cm LVOT Area:     3.14 cm  RIGHT VENTRICLE         IVC TAPSE (M-mode): 1.5 cm  IVC diam: 2.00 cm LEFT ATRIUM             Index        RIGHT ATRIUM           Index LA diam:        3.80 cm 2.54 cm/m   RA Area:     17.50 cm LA Vol (A2C):   79.6 ml 53.11 ml/m  RA Volume:   48.90 ml  32.63 ml/m LA Vol (A4C):   79.1 ml 52.78 ml/m LA Biplane Vol: 82.2 ml 54.85 ml/m   AORTA Ao Root diam: 2.90 cm Ao Asc diam:  3.10 cm TRICUSPID VALVE TR Peak grad:   52.7 mmHg TR Vmax:        363.00 cm/s  SHUNTS Systemic Diam: 2.00 cm Glori Bickers MD Electronically signed by Glori Bickers MD Signature Date/Time: 10/25/2021/5:56:23 PM    Final     Cardiac Studies   Echo: 10/25/21   IMPRESSIONS     1. Left ventricular ejection fraction, by estimation, is 65 to 70%. The  left ventricle has normal function. The left ventricle has no regional  wall motion abnormalities. Left ventricular  diastolic function could not  be evaluated.   2. Right ventricular systolic function is normal. The right ventricular  size is normal. There is moderately elevated pulmonary artery systolic  pressure. The estimated right ventricular systolic pressure is 03.5 mmHg.   3. Left atrial size was moderately dilated.   4. Right atrial size was moderately dilated.   5. 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 The posterior  leaflet is dimunitive and there is at least moderate posterior MR. The  mitral valve is myxomatous. Moderate mitral valve regurgitation. There is  severe prolapse of both leaflets  of the mitral valve. Moderate mitral annular calcification.   6. Tricuspid valve regurgitation is moderate.   7. The aortic valve is tricuspid. There is moderate calcification of the  aortic valve. Aortic valve regurgitation is mild to moderate. Aortic valve  sclerosis/calcification is present, without any evidence of aortic  stenosis.   FINDINGS   Left Ventricle: Left ventricular ejection fraction, by estimation, is 65  to 70%. The left ventricle has normal function. The left ventricle has no  regional wall motion abnormalities. The left ventricular internal cavity  size was normal in size. There is   no left  ventricular hypertrophy. Left ventricular diastolic function  could not be evaluated due to atrial fibrillation. Left ventricular  diastolic function could not be evaluated.   Right Ventricle: The right ventricular size is normal. No increase in  right ventricular wall thickness. Right ventricular systolic function is  normal. There is moderately elevated pulmonary artery systolic pressure.  The tricuspid regurgitant velocity is  3.63 m/s, and with an assumed right atrial pressure of 5 mmHg, the  estimated right ventricular systolic pressure is 36.6 mmHg.   Left Atrium: Left atrial size was moderately dilated.   Right Atrium: Right atrial size was moderately dilated.    Pericardium: There is no evidence of pericardial effusion.   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 The  posterior leaflet is dimunitive and there is at least moderate posterior  MR. The mitral valve is myxomatous. There is severe prolapse of both  leaflets of the mitral valve.  Moderate mitral annular calcification. Moderate mitral valve  regurgitation, with posteriorly-directed jet.   Tricuspid Valve: The tricuspid valve is normal in structure. Tricuspid  valve regurgitation is moderate.   Aortic Valve: The aortic valve is tricuspid. There is moderate  calcification of the aortic valve. Aortic valve regurgitation is mild to  moderate. Aortic valve sclerosis/calcification is present, without any  evidence of aortic stenosis.   Pulmonic Valve: The pulmonic valve was normal in structure. Pulmonic valve  regurgitation is trivial.   Aorta: The aortic root and ascending aorta are structurally normal, with  no evidence of dilitation.   IAS/Shunts: No atrial level shunt detected by color flow Doppler.   Patient Profile     88 y.o. female with RBBB, mitral valve prolapse with moderate to severe MR, HLD, arthritis, and PAF on Eliquis who was seen 10/24/2021 for the evaluation of afib with RVR   Assessment & Plan    Afib RVR Hx of Paroxysmal afib -- initially started on Dilt gtt, rates improved and transitioned to oral Dilt '30mg'$  q6hr along, tried to consolidate but back into RVR yesterday afternoon.  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  -- received 2 doses IV lasix, remains net + on I&Os  (incontinent x1) , no daily weights (will order) -- check BNP, initial CXR report unable to exclude PNA? -- planned for TEE on Friday to further evaluate valve, question mitraclip candidate   Hypoxia -- has been requiring ongoing O2, volume status seems improved this morning.  -- attempt to wean -- as above check BNP   HLD -- on atorvastatin '40mg'$  daily   For questions or updates, please contact Desha Please consult [email or web address] for contact info under        Signed, Reino Bellis, NP  10/27/2021, 8:07 AM     Patient seen and examined. Agree with assessment and plan.  She continues to be in atrial fibrillation, ventricular rate is improved today now around 120 compared to 140-150 yesterday.  She is on IV amiodarone and received an additional bolus this morning for improved rate control.  Blood pressure remains soft at 106/78 and for this reason will not further  titrate metoprolol currently at 12.5 mg twice a day.  Patient is now on the schedule for TEE cardioversion tomorrow further evaluate her mitral valve and see if candidate for mitral clip.  We will keep n.p.o. in a.m.  Serum 3.7, replete with KCl.  BNP 470 consistent with mild volume overload.  We will give IV Lasix 20 mg now.  Follow-up electrolytes in a.m.   Troy Sine, MD, [facility] 10/27/2021 10:51 AM

## 2021-10-27 NOTE — Progress Notes (Signed)
Mobility Specialist - Progress Note   10/27/21 1056  Mobility  Activity Ambulated with assistance in hallway  Level of Assistance Standby assist, set-up cues, supervision of patient - no hands on  Assistive Device None  Distance Ambulated (ft) 200 ft  Activity Response Tolerated fair  Mobility Referral Yes  $Mobility charge 1 Mobility   Pt received in bed and agreeable. Pt had LOB x1 but was able to recover. Pt c/o general weakness. Pt was returned to bed with all needs met.   Larey Seat

## 2021-10-27 NOTE — Care Management (Signed)
  Transition of Care [facility]) Screening Note   Patient Details  Name: Lisa Lang Date of Birth: 31-Mar-1927   Transition of Care [facility]) CM/SW Contact:    Bethena Roys, RN Phone Number: 10/27/2021, 3:17 PM    Transition of Care Department [facility]) has reviewed the patient and no TOC needs have been identified at this time. PTA patient was from home alone. Patient still drives to doctors appointment and she takes her medications without any problems. Case Manager will continue to monitor patient advancement through interdisciplinary progression rounds. If new patient transition needs arise, please place a TOC consult.

## 2021-10-27 NOTE — H&P (View-Only) (Signed)
Is a significant area found something on his echo or something with a TEE that did not get better  Rounding Note    Patient Name: Lisa Lang Date of Encounter: 10/27/2021  [place] Cardiologist: Glenetta Hew, MD   Subjective   Reports feeling better this morning, breathing has improved. Still on O2 this morning '@2L'$ .   Inpatient Medications    Scheduled Meds:  apixaban  2.5 mg Oral BID   atorvastatin  40 mg Oral QHS   metoprolol tartrate  12.5 mg Oral BID   Continuous Infusions:  amiodarone 30 mg/hr (10/27/21 0356)   PRN Meds: acetaminophen, ondansetron (ZOFRAN) IV   Vital Signs    Vitals:   10/26/21 1321 10/26/21 2009 10/27/21 0026 10/27/21 0331  BP: 103/68 111/68 104/70 117/73  Pulse: (!) 109 (!) 113 100 (!) 111  Resp: '18 18 18 18  '$ Temp: 98.1 F (36.7 C) 98.2 F (36.8 C) 98.6 F (37 C) 97.9 F (36.6 C)  TempSrc: Oral Oral Oral Oral  SpO2:  93% 91% 91%    Intake/Output Summary (Last 24 hours) at 10/27/2021 0807 Last data filed at 10/27/2021 4166 Gross per 24 hour  Intake 822.87 ml  Output 1100 ml  Net -277.13 ml      01/13/2021    4:48 PM 07/08/2020    3:01 PM 01/14/2020   11:59 AM  Last 3 Weights  Weight (lbs) 112 lb 12.8 oz 113 lb 110 lb 12.8 oz  Weight (kg) 51.166 kg 51.256 kg 50.259 kg      Telemetry    Afib RVR rates 120s - Personally Reviewed  ECG    Afib rate 108 - Personally Reviewed  Physical Exam   GEN: No acute distress.   Neck: No JVD Cardiac: Irreg Irreg, 3/6 systolic murmur, no rubs, or gallops.  Respiratory: Clear to auscultation bilaterally. GI: Soft, nontender, non-distended  MS: No edema; No deformity. Neuro:  Nonfocal  Psych: Normal affect   Labs    High Sensitivity Troponin:   Recent Labs  Lab 10/24/21 1105 10/24/21 1308  TROPONINIHS 21* 15     Chemistry Recent Labs  Lab 10/24/21 1105 10/25/21 0141 10/26/21 0202 10/27/21 0251  NA 141 139 138 137  K 4.3 4.0 3.6 3.7  CL 108 108 104 102   CO2 24 21* 24 23  GLUCOSE 144* 111* 115* 118*  BUN '20 23 20 15  '$ CREATININE 1.17* 1.15* 1.09* 1.02*  CALCIUM 9.5 9.3 8.7* 8.7*  PROT 6.5  --   --   --   ALBUMIN 3.4*  --   --   --   AST 43*  --   --   --   ALT 44  --   --   --   ALKPHOS 145*  --   --   --   BILITOT 1.2  --   --   --   GFRNONAA 44* 45* 48* 52*  ANIONGAP '9 10 10 12    '$ Lipids No results for input(s): "CHOL", "TRIG", "HDL", "LABVLDL", "LDLCALC", "CHOLHDL" in the last 168 hours.  Hematology Recent Labs  Lab 10/25/21 0141 10/26/21 0202 10/27/21 0251  WBC 9.6 10.5 10.5  RBC 4.13 4.03 4.22  HGB 12.9 12.8 13.2  HCT 40.1 38.0 40.3  MCV 97.1 94.3 95.5  MCH 31.2 31.8 31.3  MCHC 32.2 33.7 32.8  RDW 13.4 13.3 13.4  PLT 226 212 232   Thyroid  Recent Labs  Lab 10/24/21 1308  TSH 3.100  BNPNo results for input(s): "BNP", "PROBNP" in the last 168 hours.  DDimer No results for input(s): "DDIMER" in the last 168 hours.   Radiology    ECHOCARDIOGRAM COMPLETE  Result Date: 10/25/2021    ECHOCARDIOGRAM REPORT   Patient Name:   Lisa Lang Date of Exam: 10/25/2021 Medical Rec #:  [id number]     Height:       62.0 in Accession #:    [id number]    Weight:       112.8 lb Date of Birth:  1927-06-07      BSA:          1.499 m Patient Age:    88 years      BP:           121/91 mmHg Patient Gender: F             HR:           112 bpm. Exam Location:  Inpatient Procedure: 2D Echo Indications:    mitral regurgitation  History:        Patient has prior history of Echocardiogram examinations, most                 recent 12/22/2020. Mitral Valve Prolapse, Arrythmias:Atrial                 Fibrillation; Risk Factors:Dyslipidemia.  Sonographer:    Johny Chess RDCS Referring Phys: [id number] VISHNU P MALLIPEDDI IMPRESSIONS  1. Left ventricular ejection fraction, by estimation, is 65 to 70%. The left ventricle has normal function. The left ventricle has no regional wall motion abnormalities. Left ventricular diastolic function could not  be evaluated.  2. Right ventricular systolic function is normal. The right ventricular size is normal. There is moderately elevated pulmonary artery systolic pressure. The estimated right ventricular systolic pressure is 56.3 mmHg.  3. Left atrial size was moderately dilated.  4. Right atrial size was moderately dilated.  5. 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 The posterior leaflet is dimunitive and there is at least moderate posterior MR. The mitral valve is myxomatous. Moderate mitral valve regurgitation. There is severe prolapse of both leaflets of the mitral valve. Moderate mitral annular calcification.  6. Tricuspid valve regurgitation is moderate.  7. The aortic valve is tricuspid. There is moderate calcification of the aortic valve. Aortic valve regurgitation is mild to moderate. Aortic valve sclerosis/calcification is present, without any evidence of aortic stenosis. FINDINGS  Left Ventricle: Left ventricular ejection fraction, by estimation, is 65 to 70%. The left ventricle has normal function. The left ventricle has no regional wall motion abnormalities. The left ventricular internal cavity size was normal in size. There is  no left ventricular hypertrophy. Left ventricular diastolic function could not be evaluated due to atrial fibrillation. Left ventricular diastolic function could not be evaluated. Right Ventricle: The right ventricular size is normal. No increase in right ventricular wall thickness. Right ventricular systolic function is normal. There is moderately elevated pulmonary artery systolic pressure. The tricuspid regurgitant velocity is 3.63 m/s, and with an assumed right atrial pressure of 5 mmHg, the estimated right ventricular systolic pressure is 89.3 mmHg. Left Atrium: Left atrial size was moderately dilated. Right Atrium: Right atrial size was moderately dilated. Pericardium: There is no evidence of pericardial effusion. 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 The posterior leaflet is dimunitive and there is at least moderate posterior MR. The mitral valve is myxomatous. There is  severe prolapse of both leaflets of the mitral valve. Moderate mitral annular calcification. Moderate mitral valve regurgitation, with posteriorly-directed jet. Tricuspid Valve: The tricuspid valve is normal in structure. Tricuspid valve regurgitation is moderate. Aortic Valve: The aortic valve is tricuspid. There is moderate calcification of the aortic valve. Aortic valve regurgitation is mild to moderate. Aortic valve sclerosis/calcification is present, without any evidence of aortic stenosis. Pulmonic Valve: The pulmonic valve was normal in structure. Pulmonic valve regurgitation is trivial. Aorta: The aortic root and ascending aorta are structurally normal, with no evidence of dilitation. IAS/Shunts: No atrial level shunt detected by color flow Doppler.  LEFT VENTRICLE PLAX 2D LVIDd:         3.80 cm LVIDs:         2.10 cm LV PW:         0.80 cm LV IVS:        0.90 cm LVOT diam:     2.00 cm LVOT Area:     3.14 cm  RIGHT VENTRICLE         IVC TAPSE (M-mode): 1.5 cm  IVC diam: 2.00 cm LEFT ATRIUM             Index        RIGHT ATRIUM           Index LA diam:        3.80 cm 2.54 cm/m   RA Area:     17.50 cm LA Vol (A2C):   79.6 ml 53.11 ml/m  RA Volume:   48.90 ml  32.63 ml/m LA Vol (A4C):   79.1 ml 52.78 ml/m LA Biplane Vol: 82.2 ml 54.85 ml/m   AORTA Ao Root diam: 2.90 cm Ao Asc diam:  3.10 cm TRICUSPID VALVE TR Peak grad:   52.7 mmHg TR Vmax:        363.00 cm/s  SHUNTS Systemic Diam: 2.00 cm Glori Bickers MD Electronically signed by Glori Bickers MD Signature Date/Time: 10/25/2021/5:56:23 PM    Final     Cardiac Studies   Echo: 10/25/21   IMPRESSIONS     1. Left ventricular ejection fraction, by estimation, is 65 to 70%. The  left ventricle has normal function. The left ventricle has no regional  wall motion abnormalities. Left ventricular  diastolic function could not  be evaluated.   2. Right ventricular systolic function is normal. The right ventricular  size is normal. There is moderately elevated pulmonary artery systolic  pressure. The estimated right ventricular systolic pressure is 86.7 mmHg.   3. Left atrial size was moderately dilated.   4. Right atrial size was moderately dilated.   5. 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 The posterior  leaflet is dimunitive and there is at least moderate posterior MR. The  mitral valve is myxomatous. Moderate mitral valve regurgitation. There is  severe prolapse of both leaflets  of the mitral valve. Moderate mitral annular calcification.   6. Tricuspid valve regurgitation is moderate.   7. The aortic valve is tricuspid. There is moderate calcification of the  aortic valve. Aortic valve regurgitation is mild to moderate. Aortic valve  sclerosis/calcification is present, without any evidence of aortic  stenosis.   FINDINGS   Left Ventricle: Left ventricular ejection fraction, by estimation, is 65  to 70%. The left ventricle has normal function. The left ventricle has no  regional wall motion abnormalities. The left ventricular internal cavity  size was normal in size. There is   no left  ventricular hypertrophy. Left ventricular diastolic function  could not be evaluated due to atrial fibrillation. Left ventricular  diastolic function could not be evaluated.   Right Ventricle: The right ventricular size is normal. No increase in  right ventricular wall thickness. Right ventricular systolic function is  normal. There is moderately elevated pulmonary artery systolic pressure.  The tricuspid regurgitant velocity is  3.63 m/s, and with an assumed right atrial pressure of 5 mmHg, the  estimated right ventricular systolic pressure is 46.9 mmHg.   Left Atrium: Left atrial size was moderately dilated.   Right Atrium: Right atrial size was moderately dilated.    Pericardium: There is no evidence of pericardial effusion.   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 The  posterior leaflet is dimunitive and there is at least moderate posterior  MR. The mitral valve is myxomatous. There is severe prolapse of both  leaflets of the mitral valve.  Moderate mitral annular calcification. Moderate mitral valve  regurgitation, with posteriorly-directed jet.   Tricuspid Valve: The tricuspid valve is normal in structure. Tricuspid  valve regurgitation is moderate.   Aortic Valve: The aortic valve is tricuspid. There is moderate  calcification of the aortic valve. Aortic valve regurgitation is mild to  moderate. Aortic valve sclerosis/calcification is present, without any  evidence of aortic stenosis.   Pulmonic Valve: The pulmonic valve was normal in structure. Pulmonic valve  regurgitation is trivial.   Aorta: The aortic root and ascending aorta are structurally normal, with  no evidence of dilitation.   IAS/Shunts: No atrial level shunt detected by color flow Doppler.   Patient Profile     88 y.o. female with RBBB, mitral valve prolapse with moderate to severe MR, HLD, arthritis, and PAF on Eliquis who was seen 10/24/2021 for the evaluation of afib with RVR   Assessment & Plan    Afib RVR Hx of Paroxysmal afib -- initially started on Dilt gtt, rates improved and transitioned to oral Dilt '30mg'$  q6hr along, tried to consolidate but back into RVR yesterday afternoon.  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  -- received 2 doses IV lasix, remains net + on I&Os  (incontinent x1) , no daily weights (will order) -- check BNP, initial CXR report unable to exclude PNA? -- planned for TEE on Friday to further evaluate valve, question mitraclip candidate   Hypoxia -- has been requiring ongoing O2, volume status seems improved this morning.  -- attempt to wean -- as above check BNP   HLD -- on atorvastatin '40mg'$  daily   For questions or updates, please contact [place] Please consult [email or web address] for contact info under        Signed, Reino Bellis, NP  10/27/2021, 8:07 AM     Patient seen and examined. Agree with assessment and plan.  She continues to be in atrial fibrillation, ventricular rate is improved today now around 120 compared to 140-150 yesterday.  She is on IV amiodarone and received an additional bolus this morning for improved rate control.  Blood pressure remains soft at 106/78 and for this reason will not further  titrate metoprolol currently at 12.5 mg twice a day.  Patient is now on the schedule for TEE cardioversion tomorrow further evaluate her mitral valve and see if candidate for mitral clip.  We will keep n.p.o. in a.m.  Serum 3.7, replete with KCl.  BNP 470 consistent with mild volume overload.  We will give IV Lasix 20 mg now.  Follow-up electrolytes in a.m.   Troy Sine, MD, [facility] 10/27/2021 10:51 AM

## 2021-10-28 ENCOUNTER — Encounter (HOSPITAL_COMMUNITY): Payer: Self-pay | Admitting: Internal Medicine

## 2021-10-28 ENCOUNTER — Inpatient Hospital Stay (HOSPITAL_COMMUNITY): Payer: Medicare Other | Admitting: Certified Registered Nurse Anesthetist

## 2021-10-28 ENCOUNTER — Inpatient Hospital Stay (HOSPITAL_COMMUNITY): Payer: Medicare Other

## 2021-10-28 ENCOUNTER — Encounter (HOSPITAL_COMMUNITY)
Admission: EM | Disposition: A | Discharge status: Home / Self Care | Payer: Self-pay | Source: Home / Self Care | Attending: Internal Medicine

## 2021-10-28 DIAGNOSIS — I088 Other rheumatic multiple valve diseases: Secondary | ICD-10-CM

## 2021-10-28 DIAGNOSIS — I4891 Unspecified atrial fibrillation: Secondary | ICD-10-CM

## 2021-10-28 DIAGNOSIS — M1990 Unspecified osteoarthritis, unspecified site: Secondary | ICD-10-CM | POA: Diagnosis not present

## 2021-10-28 DIAGNOSIS — I341 Nonrheumatic mitral (valve) prolapse: Secondary | ICD-10-CM | POA: Diagnosis not present

## 2021-10-28 DIAGNOSIS — I340 Nonrheumatic mitral (valve) insufficiency: Secondary | ICD-10-CM

## 2021-10-28 HISTORY — PX: TEE WITHOUT CARDIOVERSION: SHX5443

## 2021-10-28 HISTORY — PX: CARDIOVERSION: SHX1299

## 2021-10-28 LAB — CBC
HCT: 40.1 % (ref 36.0–46.0)
Hemoglobin: 13.3 g/dL (ref 12.0–15.0)
MCH: 31.6 pg (ref 26.0–34.0)
MCHC: 33.2 g/dL (ref 30.0–36.0)
MCV: 95.2 fL (ref 80.0–100.0)
Platelets: 221 10*3/uL (ref 150–400)
RBC: 4.21 MIL/uL (ref 3.87–5.11)
RDW: 13.4 % (ref 11.5–15.5)
WBC: 12.3 10*3/uL — ABNORMAL HIGH (ref 4.0–10.5)
nRBC: 0 % (ref 0.0–0.2)

## 2021-10-28 LAB — BASIC METABOLIC PANEL
Anion gap: 11 (ref 5–15)
BUN: 20 mg/dL (ref 8–23)
CO2: 27 mmol/L (ref 22–32)
Calcium: 8.5 mg/dL — ABNORMAL LOW (ref 8.9–10.3)
Chloride: 99 mmol/L (ref 98–111)
Creatinine, Ser: 1.09 mg/dL — ABNORMAL HIGH (ref 0.44–1.00)
GFR, Estimated: 48 mL/min — ABNORMAL LOW (ref >60)
Glucose, Bld: 166 mg/dL — ABNORMAL HIGH (ref 70–99)
Potassium: 3.7 mmol/L (ref 3.5–5.1)
Sodium: 137 mmol/L (ref 135–145)

## 2021-10-28 LAB — ECHO TEE
MV M vel: 4.81 m/s
MV Peak grad: 92.5 mmHg
Radius: 0.6 cm

## 2021-10-28 SURGERY — TRANSESOPHAGEAL ECHOCARDIOGRAM (TEE)
Anesthesia: General

## 2021-10-28 MED ORDER — POTASSIUM CHLORIDE CRYS ER 20 MEQ PO TBCR: 40.0000 meq | EXTENDED_RELEASE_TABLET | Freq: Once | ORAL | Status: DC

## 2021-10-28 MED ORDER — SODIUM CHLORIDE 0.9 % IV SOLN: INTRAVENOUS | Status: DC

## 2021-10-28 MED ORDER — AMIODARONE HCL 200 MG PO TABS
200.0000 mg | ORAL_TABLET | Freq: Two times a day (BID) | ORAL | Status: DC
  Administered 2021-10-28 – 2021-10-29 (×3): 200 mg via ORAL
  Filled 2021-10-28 (×3): qty 1

## 2021-10-28 MED ORDER — PROPOFOL 500 MG/50ML IV EMUL
INTRAVENOUS | Status: DC | PRN
  Administered 2021-10-28: 125 ug/kg/min via INTRAVENOUS

## 2021-10-28 MED ORDER — PROPOFOL 10 MG/ML IV BOLUS
INTRAVENOUS | Status: DC | PRN
  Administered 2021-10-28: 10 mg via INTRAVENOUS

## 2021-10-28 MED ORDER — POTASSIUM CHLORIDE CRYS ER 10 MEQ PO TBCR
40.0000 meq | EXTENDED_RELEASE_TABLET | Freq: Once | ORAL | Status: AC
  Administered 2021-10-28: 40 meq via ORAL
  Filled 2021-10-28: qty 4

## 2021-10-28 MED ORDER — PHENYLEPHRINE 80 MCG/ML (10ML) SYRINGE FOR IV PUSH (FOR BLOOD PRESSURE SUPPORT)
PREFILLED_SYRINGE | INTRAVENOUS | Status: DC | PRN
  Administered 2021-10-28 (×2): 160 ug via INTRAVENOUS
  Administered 2021-10-28: 80 ug via INTRAVENOUS

## 2021-10-28 NOTE — Progress Notes (Signed)
Mobility Specialist - Progress Note   10/28/21 1150  Mobility  Activity Refused mobility   Pt refused mobility stating she wanted to rest a little bit after coming back from her procedure. Will follow up.  Larey Seat

## 2021-10-28 NOTE — CV Procedure (Signed)
Procedure: Electrical Cardioversion Indications:  Atrial Fibrillation  Procedure Details:  Consent: Risks of procedure as well as the alternatives and risks of each were explained to the (patient/caregiver).  Consent for procedure obtained.  Time Out: Verified patient identification, verified procedure, site/side was marked, verified correct patient position, special equipment/implants available, medications/allergies/relevent history reviewed, required imaging and test results available. PERFORMED.  Patient placed on cardiac monitor, pulse oximetry, supplemental oxygen as necessary.  Sedation given:  Propofol '178mg'$  Pacer pads placed anterior and posterior chest.  Cardioverted 1 time(s).  Cardioversion with synchronized biphasic 150J shock.  Evaluation: Findings: Post procedure EKG shows: NSR Complications: None Patient did tolerate procedure well.  Time Spent Directly with the Patient:  19mnutes   Lisa Bergeron10/25/2023, 10:27 AM

## 2021-10-28 NOTE — Interval H&P Note (Signed)
History and Physical Interval Note:  10/28/2021 9:34 AM  Lisa Lang  has presented today for surgery, with the diagnosis of AFIB, MR.  The various methods of treatment have been discussed with the patient and family. After consideration of risks, benefits and other options for treatment, the patient has consented to  Procedure(s): TRANSESOPHAGEAL ECHOCARDIOGRAM (TEE) (N/A) CARDIOVERSION (N/A) as a surgical intervention.  The patient's history has been reviewed, patient examined, no change in status, stable for surgery.  I have reviewed the patient's chart and labs.  Questions were answered to the patient's satisfaction.     Freada Bergeron

## 2021-10-28 NOTE — Progress Notes (Signed)
  Echocardiogram Echocardiogram Transesophageal has been performed.  Lisa Lang 10/28/2021, 10:58 AM

## 2021-10-28 NOTE — Progress Notes (Addendum)
Abdominal down  Rounding Note    Patient Name: Lisa Lang Date of Encounter: 10/28/2021  [place] Cardiologist: Glenetta Hew, MD   Subjective   Feeling much this morning. Breathing has significantly improved   Inpatient Medications    Scheduled Meds:  apixaban  2.5 mg Oral BID   atorvastatin  40 mg Oral QHS   metoprolol tartrate  12.5 mg Oral BID   Continuous Infusions:  sodium chloride 20 mL/hr at 10/28/21 0630   amiodarone 30 mg/hr (10/28/21 0630)   PRN Meds: acetaminophen, ondansetron (ZOFRAN) IV   Vital Signs    Vitals:   10/27/21 1938 10/27/21 2358 10/28/21 0555 10/28/21 0558  BP: 109/67 (!) 100/47  109/75  Pulse: (!) 137 (!) 112  (!) 114  Resp: 18   18  Temp: 97.7 F (36.5 C) 98 F (36.7 C)  98.9 F (37.2 C)  TempSrc: Oral Oral  Oral  SpO2: 96% 97%  90%  Weight:   49.1 kg     Intake/Output Summary (Last 24 hours) at 10/28/2021 0739 Last data filed at 10/28/2021 0630 Gross per 24 hour  Intake 753.49 ml  Output 1000 ml  Net -246.51 ml      10/28/2021    5:55 AM 01/13/2021    4:48 PM 07/08/2020    3:01 PM  Last 3 Weights  Weight (lbs) 108 lb 3.2 oz 112 lb 12.8 oz 113 lb  Weight (kg) 49.079 kg 51.166 kg 51.256 kg      Telemetry    Afib RVR rates 100s - Personally Reviewed  ECG    Afib RVR 114bpm - Personally Reviewed  Physical Exam   GEN: No acute distress.   Neck: No JVD Cardiac: Irreg Irreg, 3/6 systolic murmur, no rubs, or gallops.  Respiratory: Clear to auscultation bilaterally. GI: Soft, nontender, non-distended  MS: No edema; No deformity. Neuro:  Nonfocal  Psych: Normal affect   Labs    High Sensitivity Troponin:   Recent Labs  Lab 10/24/21 1105 10/24/21 1308  TROPONINIHS 21* 15     Chemistry Recent Labs  Lab 10/24/21 1105 10/25/21 0141 10/26/21 0202 10/27/21 0251 10/28/21 0514  NA 141   < > 138 137 137  K 4.3   < > 3.6 3.7 3.7  CL 108   < > 104 102 99  CO2 24   < > '24 23 27  '$ GLUCOSE 144*   < >  115* 118* 166*  BUN 20   < > '20 15 20  '$ CREATININE 1.17*   < > 1.09* 1.02* 1.09*  CALCIUM 9.5   < > 8.7* 8.7* 8.5*  MG  --   --   --  1.9  --   PROT 6.5  --   --   --   --   ALBUMIN 3.4*  --   --   --   --   AST 43*  --   --   --   --   ALT 44  --   --   --   --   ALKPHOS 145*  --   --   --   --   BILITOT 1.2  --   --   --   --   GFRNONAA 44*   < > 48* 52* 48*  ANIONGAP 9   < > '10 12 11   '$ < > = values in this interval not displayed.    Lipids No results for input(s): "CHOL", "TRIG", "HDL", "LABVLDL", "LDLCALC", "  CHOLHDL" in the last 168 hours.  Hematology Recent Labs  Lab 10/26/21 0202 10/27/21 0251 10/28/21 0514  WBC 10.5 10.5 12.3*  RBC 4.03 4.22 4.21  HGB 12.8 13.2 13.3  HCT 38.0 40.3 40.1  MCV 94.3 95.5 95.2  MCH 31.8 31.3 31.6  MCHC 33.7 32.8 33.2  RDW 13.3 13.4 13.4  PLT 212 232 221   Thyroid  Recent Labs  Lab 10/24/21 1308  TSH 3.100    BNP Recent Labs  Lab 10/27/21 0251  BNP 470.8*    DDimer No results for input(s): "DDIMER" in the last 168 hours.   Radiology    No results found.  Cardiac Studies   Echo: 10/25/21   IMPRESSIONS     1. Left ventricular ejection fraction, by estimation, is 65 to 70%. The  left ventricle has normal function. The left ventricle has no regional  wall motion abnormalities. Left ventricular diastolic function could not  be evaluated.   2. Right ventricular systolic function is normal. The right ventricular  size is normal. There is moderately elevated pulmonary artery systolic  pressure. The estimated right ventricular systolic pressure is 40.3 mmHg.   3. Left atrial size was moderately dilated.   4. Right atrial size was moderately dilated.   5. 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 The posterior  leaflet is dimunitive and there is at least moderate posterior MR. The  mitral valve is myxomatous. Moderate mitral valve regurgitation. There is  severe prolapse of both leaflets  of the mitral valve.  Moderate mitral annular calcification.   6. Tricuspid valve regurgitation is moderate.   7. The aortic valve is tricuspid. There is moderate calcification of the  aortic valve. Aortic valve regurgitation is mild to moderate. Aortic valve  sclerosis/calcification is present, without any evidence of aortic  stenosis.   FINDINGS   Left Ventricle: Left ventricular ejection fraction, by estimation, is 65  to 70%. The left ventricle has normal function. The left ventricle has no  regional wall motion abnormalities. The left ventricular internal cavity  size was normal in size. There is   no left ventricular hypertrophy. Left ventricular diastolic function  could not be evaluated due to atrial fibrillation. Left ventricular  diastolic function could not be evaluated.   Right Ventricle: The right ventricular size is normal. No increase in  right ventricular wall thickness. Right ventricular systolic function is  normal. There is moderately elevated pulmonary artery systolic pressure.  The tricuspid regurgitant velocity is  3.63 m/s, and with an assumed right atrial pressure of 5 mmHg, the  estimated right ventricular systolic pressure is 47.4 mmHg.   Left Atrium: Left atrial size was moderately dilated.   Right Atrium: Right atrial size was moderately dilated.   Pericardium: There is no evidence of pericardial effusion.   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 The  posterior leaflet is dimunitive and there is at least moderate posterior  MR. The mitral valve is myxomatous. There is severe prolapse of both  leaflets of the mitral valve.  Moderate mitral annular calcification. Moderate mitral valve  regurgitation, with posteriorly-directed jet.   Tricuspid Valve: The tricuspid valve is normal in structure. Tricuspid  valve regurgitation is moderate.   Aortic Valve: The aortic valve is tricuspid. There is moderate  calcification of the aortic valve. Aortic  valve regurgitation is mild to  moderate. Aortic valve sclerosis/calcification is present, without any  evidence of aortic stenosis.   Pulmonic  Valve: The pulmonic valve was normal in structure. Pulmonic valve  regurgitation is trivial.   Aorta: The aortic root and ascending aorta are structurally normal, with  no evidence of dilitation.    Patient Profile     88 y.o. female with RBBB, mitral valve prolapse with moderate to severe MR, HLD, arthritis, and PAF on Eliquis who was seen 10/24/2021 for the evaluation of afib with RVR   Assessment & Plan    Afib RVR Hx of Paroxysmal afib -- initially started on Dilt gtt, rates improved and transitioned to oral Dilt '30mg'$  q6hr along, tried to consolidate but back into RVR yesterday afternoon.  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  -- BNP 470 -- received several doses IV lasix, 1.2L UOP yesterday -- planned for TEE today to further evaluate valve, question mitraclip candidate    Hypoxia -- had been requiring ongoing O2, volume status improving  -- now on RA   HLD -- on atorvastatin '40mg'$  daily   For questions or updates, please contact [place] Please consult [email or web address] for contact info under        Signed, Reino Bellis, NP  10/28/2021, 7:39 AM      Patient seen and examined. Agree with assessment and plan.  Patient is just back from TEE guided cardioversion.  She is now in sinus rhythm ventricular rate in the 70s with an occasional PVC.  Transition from IV amiodarone to oral amiodarone at 200 mg twice a day.  TEE revealed myxomatous mitral valve with diminutive posterior leaflet and an overriding  anterior leaflet with bileaflet prolapse.  There was moderate mitral regurgitation with a larger posterior directed jet and a small anterior directed jet without systolic flow reversal in the pulmonic vein.  We will have imaging reviewed by structural team but according to the patient's son he was told that one of the leaflets may be too small for mitral clip.    Troy Sine, MD, [facility] 10/28/2021 11:46 AM

## 2021-10-28 NOTE — Anesthesia Postprocedure Evaluation (Signed)
Anesthesia Post Note  Patient: Lisa Lang  Procedure(s) Performed: TRANSESOPHAGEAL ECHOCARDIOGRAM (TEE) CARDIOVERSION     Patient location during evaluation: PACU Anesthesia Type: General Level of consciousness: awake and alert Pain management: pain level controlled Vital Signs Assessment: post-procedure vital signs reviewed and stable Respiratory status: spontaneous breathing, nonlabored ventilation, respiratory function stable and patient connected to nasal cannula oxygen Cardiovascular status: blood pressure returned to baseline and stable Postop Assessment: no apparent nausea or vomiting Anesthetic complications: no   No notable events documented.  Last Vitals:  Vitals:   10/28/21 1050 10/28/21 1154  BP: 107/60 (!) 119/53  Pulse: 76 79  Resp: (!) 26   Temp:    SpO2: 94% 97%    Last Pain:  Vitals:   10/28/21 1154  TempSrc:   PainSc: 0-No pain                 [name]

## 2021-10-28 NOTE — Progress Notes (Signed)
Mobility Specialist - Progress Note   10/28/21 1539  Mobility  Activity Ambulated with assistance in hallway  Level of Assistance Standby assist, set-up cues, supervision of patient - no hands on  Assistive Device None  Distance Ambulated (ft) 400 ft  Activity Response Tolerated well  Mobility Referral Yes  $Mobility charge 1 Mobility   Pt received in bed and agreeable. No complaints throughout. Pt returned to bed with all needs met.   Larey Seat

## 2021-10-28 NOTE — CV Procedure (Addendum)
     Transesophageal Echocardiogram Note  HANIYYAH SAKUMA [id number] January 13, 1927  Procedure: Transesophageal Echocardiogram Indications: Afib, mitral regurgitation  Procedure Details Consent: Obtained Time Out: Verified patient identification, verified procedure, site/side was marked, verified correct patient position, special equipment/implants available, Radiology Safety Procedures followed,  medications/allergies/relevent history reviewed, required imaging and test results available.  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 MR appears moderate. There is no systolic flow reversal in the pulmonary veins.  Aortic Valve: Tricuspid. Mild thickening and calcification. Mild AR  Tricuspid Valve: Normal structure. Moderate TR. PASP 51mHg +RAP  Pulmonic Valve: Normal structure, trivial PR  Left Atrium/ Left atrial appendage: No evidence of LAA thrombus  Atrial septum: Grossly normal  Aorta: Mild plaquing   Complications: No apparent complications Patient did tolerate procedure well.  Following TEE, the patient underwent successful DCCV with 150J. Please see separate note for details.  HFreada Bergeron MD 10/28/2021, 10:21 AM

## 2021-10-28 NOTE — Transfer of Care (Signed)
Immediate Anesthesia Transfer of Care Note  Patient: Lisa Lang  Procedure(s) Performed: TRANSESOPHAGEAL ECHOCARDIOGRAM (TEE) CARDIOVERSION  Patient Location: Endoscopy Unit  Anesthesia Type:General  Level of Consciousness: drowsy and patient cooperative  Airway & Oxygen Therapy: Patient Spontanous Breathing and Patient connected to nasal cannula oxygen  Post-op Assessment: Report given to RN and Post -op Vital signs reviewed and stable  Post vital signs: Reviewed and stable  Last Vitals:  Vitals Value Taken Time  BP 98/72   Temp    Pulse 63 10/28/21 1028  Resp 18 10/28/21 1028  SpO2 94 % 10/28/21 1028  Vitals shown include unvalidated device data.  Last Pain:  Vitals:   10/28/21 0926  TempSrc: Tympanic  PainSc: 0-No pain      Patients Stated Pain Goal: 0 (59/16/38 4665)  Complications: No notable events documented.

## 2021-10-28 NOTE — Anesthesia Preprocedure Evaluation (Signed)
Anesthesia Evaluation  Patient identified by MRN, date of birth, ID band Patient awake    Reviewed: Allergy & Precautions, NPO status , Patient's Chart, lab work & pertinent test results  Airway Mallampati: II  TM Distance: >3 FB     Dental   Pulmonary neg pulmonary ROS,    Pulmonary exam normal        Cardiovascular + dysrhythmias  Rhythm:Irregular Rate:Normal     Neuro/Psych negative neurological ROS     GI/Hepatic negative GI ROS, Neg liver ROS,   Endo/Other  negative endocrine ROS  Renal/GU negative Renal ROS     Musculoskeletal  (+) Arthritis ,   Abdominal   Peds  Hematology negative hematology ROS (+)   Anesthesia Other Findings   Reproductive/Obstetrics                             Anesthesia Physical Anesthesia Plan  ASA: 3  Anesthesia Plan: MAC   Post-op Pain Management:    Induction:   PONV Risk Score and Plan: 2 and Propofol infusion  Airway Management Planned: Natural Airway and Nasal Cannula  Additional Equipment:   Intra-op Plan:   Post-operative Plan:   Informed Consent: I have reviewed the patients History and Physical, chart, labs and discussed the procedure including the risks, benefits and alternatives for the proposed anesthesia with the patient or authorized representative who has indicated his/her understanding and acceptance.       Plan Discussed with:   Anesthesia Plan Comments:         Anesthesia Quick Evaluation

## 2021-10-29 ENCOUNTER — Other Ambulatory Visit (HOSPITAL_COMMUNITY): Payer: Self-pay

## 2021-10-29 DIAGNOSIS — I340 Nonrheumatic mitral (valve) insufficiency: Secondary | ICD-10-CM | POA: Diagnosis not present

## 2021-10-29 DIAGNOSIS — E785 Hyperlipidemia, unspecified: Secondary | ICD-10-CM | POA: Diagnosis not present

## 2021-10-29 DIAGNOSIS — I341 Nonrheumatic mitral (valve) prolapse: Secondary | ICD-10-CM | POA: Diagnosis not present

## 2021-10-29 DIAGNOSIS — I4891 Unspecified atrial fibrillation: Secondary | ICD-10-CM | POA: Diagnosis not present

## 2021-10-29 LAB — CBC
HCT: 38.1 % (ref 36.0–46.0)
Hemoglobin: 12.8 g/dL (ref 12.0–15.0)
MCH: 32.1 pg (ref 26.0–34.0)
MCHC: 33.6 g/dL (ref 30.0–36.0)
MCV: 95.5 fL (ref 80.0–100.0)
Platelets: 217 10*3/uL (ref 150–400)
RBC: 3.99 MIL/uL (ref 3.87–5.11)
RDW: 13.2 % (ref 11.5–15.5)
WBC: 12.7 10*3/uL — ABNORMAL HIGH (ref 4.0–10.5)
nRBC: 0 % (ref 0.0–0.2)

## 2021-10-29 LAB — BASIC METABOLIC PANEL
Anion gap: 10 (ref 5–15)
BUN: 22 mg/dL (ref 8–23)
CO2: 27 mmol/L (ref 22–32)
Calcium: 9 mg/dL (ref 8.9–10.3)
Chloride: 101 mmol/L (ref 98–111)
Creatinine, Ser: 1.04 mg/dL — ABNORMAL HIGH (ref 0.44–1.00)
GFR, Estimated: 50 mL/min — ABNORMAL LOW (ref >60)
Glucose, Bld: 106 mg/dL — ABNORMAL HIGH (ref 70–99)
Potassium: 4.6 mmol/L (ref 3.5–5.1)
Sodium: 138 mmol/L (ref 135–145)

## 2021-10-29 MED ORDER — AMIODARONE HCL 200 MG PO TABS
ORAL_TABLET | ORAL | 0 refills | Status: DC
  Filled 2021-10-29: qty 37, 30d supply, fill #0

## 2021-10-29 MED ORDER — FUROSEMIDE 20 MG PO TABS
20.0000 mg | ORAL_TABLET | Freq: Every day | ORAL | 1 refills | Status: DC | PRN
  Filled 2021-10-29: qty 30, 30d supply, fill #0

## 2021-10-29 MED ORDER — METOPROLOL TARTRATE 25 MG PO TABS: 12.5000 mg | ORAL_TABLET | Freq: Two times a day (BID) | ORAL | 3 refills | Status: DC

## 2021-10-29 NOTE — Progress Notes (Signed)
Mobility Specialist Progress Note   10/29/21 1125  Mobility  Activity Ambulated with assistance in hallway;Ambulated with assistance to bathroom  Level of Assistance Standby assist, set-up cues, supervision of patient - no hands on  Assistive Device Front wheel walker  Distance Ambulated (ft) 240 ft  Range of Motion/Exercises Active;All extremities  Activity Response Tolerated well   Patient received dangling EOB agreeable to participate in mobility. Ambulated supervision level with steady gait. Returned to room without complaint or incident. Was left in restroom with all needs met, call bell in reach.   Jordan Tripp, CMS, BS [facility]  Office: [phone]  

## 2021-10-29 NOTE — Care Management Important Message (Signed)
Important Message  Patient Details  Name: Lisa Lang MRN: [id number] Date of Birth: 06/03/1927   Medicare Important Message Given:  Yes     Shelda Altes 10/29/2021, 10:59 AM

## 2021-10-29 NOTE — Discharge Summary (Addendum)
Discharge Summary    Patient ID: Lisa Lang MRN: [id number]; DOB: Dec 25, 1927  Admit date: 10/24/2021 Discharge date: 10/29/2021  PCP:  Lavone Orn, [place] Prov[facility] Cardiologist:  Glenetta Hew, MD      Discharge Diagnoses    Principal Problem:   Atrial fibrillation with rapid ventricular response [facility]) Active Problems:   Hyperlipidemia LDL goal <100   MVP (mitral valve prolapse)   Diagnostic Studies/Procedures    TTE: 10/25/21  IMPRESSIONS     1. Left ventricular ejection fraction, by estimation, is 65 to 70%. The  left ventricle has normal function. The left ventricle has no regional  wall motion abnormalities. Left ventricular diastolic function could not  be evaluated.   2. Right ventricular systolic function is normal. The right ventricular  size is normal. There is moderately elevated pulmonary artery systolic  pressure. The estimated right ventricular systolic pressure is 40.9 mmHg.   3. Left atrial size was moderately dilated.   4. Right atrial size was moderately dilated.   5. 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 The posterior  leaflet is dimunitive and there is at least moderate posterior MR. The  mitral valve is myxomatous. Moderate mitral valve regurgitation. There is  severe prolapse of both leaflets  of the mitral valve. Moderate mitral annular calcification.   6. Tricuspid valve regurgitation is moderate.   7. The aortic valve is tricuspid. There is moderate calcification of the  aortic valve. Aortic valve regurgitation is mild to moderate. Aortic valve  sclerosis/calcification is present, without any evidence of aortic  stenosis.   FINDINGS   Left Ventricle: Left ventricular ejection fraction, by estimation, is 65  to 70%. The left ventricle has normal function. The left ventricle has no  regional wall motion abnormalities. The left ventricular internal cavity  size was normal in size. There is   no  left ventricular hypertrophy. Left ventricular diastolic function  could not be evaluated due to atrial fibrillation. Left ventricular  diastolic function could not be evaluated.   Right Ventricle: The right ventricular size is normal. No increase in  right ventricular wall thickness. Right ventricular systolic function is  normal. There is moderately elevated pulmonary artery systolic pressure.  The tricuspid regurgitant velocity is  3.63 m/s, and with an assumed right atrial pressure of 5 mmHg, the  estimated right ventricular systolic pressure is 81.1 mmHg.   Left Atrium: Left atrial size was moderately dilated.   Right Atrium: Right atrial size was moderately dilated.   Pericardium: There is no evidence of pericardial effusion.   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 The  posterior leaflet is dimunitive and there is at least moderate posterior  MR. The mitral valve is myxomatous. There is severe prolapse of both  leaflets of the mitral valve.  Moderate mitral annular calcification. Moderate mitral valve  regurgitation, with posteriorly-directed jet.   Tricuspid Valve: The tricuspid valve is normal in structure. Tricuspid  valve regurgitation is moderate.   Aortic Valve: The aortic valve is tricuspid. There is moderate  calcification of the aortic valve. Aortic valve regurgitation is mild to  moderate. Aortic valve sclerosis/calcification is present, without any  evidence of aortic stenosis.   Pulmonic Valve: The pulmonic valve was normal in structure. Pulmonic valve  regurgitation is trivial.   Aorta: The aortic root and ascending aorta are structurally normal, with  no evidence of dilitation.   IAS/Shunts: No atrial level  shunt detected by color flow Doppler.   TEE: 10/28/21  IMPRESSIONS     1. Left ventricular ejection fraction, by estimation, is 65 to 70%. The  left ventricle has normal function.   2. Right ventricular systolic  function is normal. The right ventricular  size is normal.   3. Left atrial size was moderately dilated. No left atrial/left atrial  appendage thrombus was detected.   4. Right atrial size was moderately dilated.   5. The mitral valve is myxomatous with a large overriding anterior mitral  valve leaflet and a diminutive posterior leaflet that measures about 8-70m  in length. There is bileaflet prolapse with a larger posteriorly directed  jet and a smaller anteriorly  directed jet. Overall, mitral regurgitation appears moderate (EROA of  posterior jet 0.2cm2, RVol 231m. There is no systolic flow reversal in  the pulmonary veins. PASP is 2418m +RAP. . TMarland Kitchene mitral valve is  myxomatous. Moderate mitral valve regurgitation.   6. Tricuspid valve regurgitation is mild to moderate.   7. The aortic valve is tricuspid. There is mild calcification of the  aortic valve. There is mild thickening of the aortic valve. Aortic valve  regurgitation is mild. Aortic valve sclerosis/calcification is present,  without any evidence of aortic  stenosis.   8. Following TEE, the patient underwent successful DCCV with 150J x1.   FINDINGS   Left Ventricle: Left ventricular ejection fraction, by estimation, is 65  to 70%. The left ventricle has normal function. The left ventricular  internal cavity size was normal in size.   Right Ventricle: The right ventricular size is normal. No increase in  right ventricular wall thickness. Right ventricular systolic function is  normal.   Left Atrium: Left atrial size was moderately dilated. No left atrial/left  atrial appendage thrombus was detected.   Right Atrium: Right atrial size was moderately dilated.   Pericardium: There is no evidence of pericardial effusion.   Mitral Valve: The mitral valve is myxomatous with a large overriding  anterior mitral valve leaflet and a diminutive posterior leaflet that  measures about 8-9mm37m length. There is bileaflet prolapse  with a larger  posteriorly directed jet and a smaller  anteriorly directed jet. Overall, mitral regurgitation appears moderate  (EROA of posterior jet 0.2cm2, RVol 21mL76mhere is no systolic flow  reversal in the pulmonary veins. PASP is 24mmH62mAP. The mitral valve is  myxomatous. Moderate mitral valve  regurgitation.   Tricuspid Valve: The tricuspid valve is normal in structure. Tricuspid  valve regurgitation is mild to moderate.   Aortic Valve: The aortic valve is tricuspid. There is mild calcification  of the aortic valve. There is mild thickening of the aortic valve. Aortic  valve regurgitation is mild. Aortic valve sclerosis/calcification is  present, without any evidence of  aortic stenosis.   Pulmonic Valve: The pulmonic valve was normal in structure. Pulmonic valve  regurgitation is trivial.   Aorta: The aortic root is normal in size and structure.   IAS/Shunts: The atrial septum is grossly normal.  _____________   History of Present Illness     Lisa PDENNISSE SWADER89 y.o41female with PMH of RBBB, mitral valve prolapse with moderate to severe MR, HLD, arthritis, and PAF on Eliquis who was seen 10/24/2021 for the evaluation of afib with RVR.   Lisa Lang followed by Dr. HardinEllyn Hacker cardiology care. She has a history of PAF but very few episodes. She was intolerant to scheduled beta blockers  and placed on PRN metoprolol. She also was followed for severe MVP with moderate to severe MR. This was reviewed with Dr. Burt Knack who was concerned about her anterior leaflet, which was extremely myxomatous/thickened. He was not confident that she would do well with Clip. If there was great concern for symptomatic MR, TEE with one of the structural imagers was felt to be next step to better define MR severity and whether Clip could be considered. Given very stable symptoms, plan was for continued monitoring.    She was in her usual state of health until she developed  palpitations associated with dyspnea. She was brought to [facility] ED via EMS. She denies having chest pain, lightheadedness or dizziness. Denies nausea or diaphoresis. Has not missed any doses of Eliquis.   ECG in ER showed afib with RVR, RBBB, HR 136 bpm. Creat 1.17, K 4.3, WBC 8.5, PLT 245, Hg 14.1.  Hospital Course     Afib RVR Hx of Paroxysmal afib -- initially started on Dilt gtt, rates improved and transitioned to oral Dilt '30mg'$  q6hr along, tried to consolidate but back into RVR. 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  -- BNP 470 -- diuresed with IV lasix, weaned from O2 -- TEE 10/25 mitral valve is myxomatous with a diminutive posterior leaflet and an overriding anterior leaflet.  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 MR appears moderate. There is no systolic flow reversal in the pulmonary veins. Her TEE was reviewed with structural and felt not to be a clip candidate.  -- PRN lasix '20mg'$  at discharge    HLD -- on atorvastatin '40mg'$  daily   General: Well developed, well nourished, female appearing in no acute distress. Head: Normocephalic, atraumatic.  Neck: Supple without bruits, JVD. Lungs:  Resp regular and unlabored, CTA. Heart: RRR, S1, S2, no S3, S4, 3/6 systolic murmur; no rub. Abdomen: Soft, non-tender, non-distended with normoactive bowel sounds. No hepatomegaly. No rebound/guarding. No obvious abdominal masses. Extremities: No clubbing, cyanosis, edema. Distal pedal pulses are 2+ bilaterally.  Neuro: Alert and oriented X 3. Moves all extremities  spontaneously. Psych: Normal affect.   Did the patient have an acute coronary syndrome (MI, NSTEMI, STEMI, etc) this admission?:  No                               Did the patient have a percutaneous coronary intervention (stent / angioplasty)?:  No.    The patient will be scheduled for a TOC follow up appointment in 10-14 days.  A message has been sent to the [facility] and Scheduling Pool at the office where the patient should be seen for follow up.  _____________  Discharge Vitals Blood pressure 116/64, pulse 83, temperature 98.8 F (37.1 C), temperature source Oral, resp. rate 18, weight 52.7 kg, SpO2 95 %.  Filed Weights   10/28/21 0555 10/29/21 0454  Weight: 49.1 kg 52.7 kg    Labs & Radiologic Studies    CBC Recent Labs    10/28/21 0514 10/29/21 0251  WBC 12.3* 12.7*  HGB 13.3 12.8  HCT  40.1 38.1  MCV 95.2 95.5  PLT 221 301   Basic Metabolic Panel Recent Labs    10/27/21 0251 10/28/21 0514 10/29/21 0251  NA 137 137 138  K 3.7 3.7 4.6  CL 102 99 101  CO2 '23 27 27  '$ GLUCOSE 118* 166* 106*  BUN '15 20 22  '$ CREATININE 1.02* 1.09* 1.04*  CALCIUM 8.7* 8.5* 9.0  MG 1.9  --   --    Liver Function Tests No results for input(s): "AST", "ALT", "ALKPHOS", "BILITOT", "PROT", "ALBUMIN" in the last 72 hours. No results for input(s): "LIPASE", "AMYLASE" in the last 72 hours. High Sensitivity Troponin:   Recent Labs  Lab 10/24/21 1105 10/24/21 1308  TROPONINIHS 21* 15    BNP Invalid input(s): "POCBNP" D-Dimer No results for input(s): "DDIMER" in the last 72 hours. Hemoglobin A1C No results for input(s): "HGBA1C" in the last 72 hours. Fasting Lipid Panel No results for input(s): "CHOL", "HDL", "LDLCALC", "TRIG", "CHOLHDL", "LDLDIRECT" in the last 72 hours. Thyroid Function Tests No results for input(s): "TSH", "T4TOTAL", "T3FREE", "THYROIDAB" in the last 72 hours.  Invalid input(s): "FREET3" _____________  ECHO TEE  Result Date: 10/28/2021    TRANSESOPHOGEAL  ECHO REPORT   Patient Name:   Lisa Lang Date of Exam: 10/28/2021 Medical Rec #:  [id number]     Height:       62.0 in Accession #:    [id number]    Weight:       108.2 lb Date of Birth:  Sep 09, 1927      BSA:          1.472 m Patient Age:    82 years      BP:           91/61 mmHg Patient Gender: F             HR:           110 bpm. Exam Location:  Inpatient Procedure: TEE-Intraopertive, 3D Echo, Cardiac Doppler and Color Doppler Indications:     mitral regurgitation. atrial fibrillation  History:         Patient has prior history of Echocardiogram examinations, most                  recent 10/25/2021. Mitral Valve Prolapse; Risk                  Factors:Dyslipidemia.  Sonographer:     Johny Chess RDCS Referring Phys:  [id number] Greer Ee [place] Diagnosing Phys: Gwyndolyn Kaufman MD PROCEDURE: After discussion of the risks and benefits of a TEE, an informed consent was obtained from the patient. The transesophogeal probe was passed without difficulty through the esophogus of the patient. Sedation performed by different physician. The patient was monitored while under deep sedation. Anesthestetic sedation was provided intravenously by Anesthesiology: '178mg'$  of Propofol. The patient developed no complications during the procedure. A direct current cardioversion was performed. IMPRESSIONS  1. Left ventricular ejection fraction, by estimation, is 65 to 70%. The left ventricle has normal function.  2. Right ventricular systolic function is normal. The right ventricular size is normal.  3. Left atrial size was moderately dilated. No left atrial/left atrial appendage thrombus was detected.  4. Right atrial size was moderately dilated.  5. The mitral valve is myxomatous with a large overriding anterior mitral valve leaflet and a diminutive posterior leaflet that measures about 8-82m in length. 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  Overall, mitral  regurgitation appears moderate (EROA of posterior jet 0.2cm2, RVol 20m). There is no systolic flow reversal in the pulmonary veins. PASP is 247mg +RAP. . [name] Kitchenhe mitral valve is myxomatous. Moderate mitral valve regurgitation.  6. Tricuspid valve regurgitation is mild to moderate.  7. The aortic valve is tricuspid. There is mild calcification of the aortic valve. There is mild thickening of the aortic valve. Aortic valve regurgitation is mild. Aortic valve sclerosis/calcification is present, without any evidence of aortic stenosis.  8. Following TEE, the patient underwent successful DCCV with 150J x1. FINDINGS  Left Ventricle: Left ventricular ejection fraction, by estimation, is 65 to 70%. The left ventricle has normal function. The left ventricular internal cavity size was normal in size. Right Ventricle: The right ventricular size is normal. No increase in right ventricular wall thickness. Right ventricular systolic function is normal. Left Atrium: Left atrial size was moderately dilated. No left atrial/left atrial appendage thrombus was detected. Right Atrium: Right atrial size was moderately dilated. Pericardium: There is no evidence of pericardial effusion. Mitral Valve: The mitral valve is myxomatous with a large overriding anterior mitral valve leaflet and a diminutive posterior leaflet that measures about 8-35m3mn length. 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 Overall, mitral regurgitation appears moderate (EROA of posterior jet 0.2cm2, RVol 71m5mThere is no systolic flow reversal in the pulmonary veins. PASP is 24mm38mRAP. The mitral valve is myxomatous. Moderate mitral valve regurgitation. Tricuspid Valve: The tricuspid valve is normal in structure. Tricuspid valve regurgitation is mild to moderate. Aortic Valve: The aortic valve is tricuspid. There is mild calcification of the aortic valve. There is mild thickening of the aortic valve.  Aortic valve regurgitation is mild. Aortic valve sclerosis/calcification is present, without any evidence of aortic stenosis. Pulmonic Valve: The pulmonic valve was normal in structure. Pulmonic valve regurgitation is trivial. Aorta: The aortic root is normal in size and structure. IAS/Shunts: The atrial septum is grossly normal.  MR Peak grad:    92.5 mmHg MR Mean grad:    55.0 mmHg MR Vmax:         481.00 cm/s MR Vmean:        343.0 cm/s MR PISA:         2.26 cm MR PISA Eff ROA: 18 mm MR PISA Radius:  0.60 cm HeathGwyndolyn Kaufmanlectronically signed by HeathGwyndolyn Kaufmanignature Date/Time: 10/28/2021/11:20:18 AM    Final    ECHOCARDIOGRAM COMPLETE  Result Date: 10/25/2021    ECHOCARDIOGRAM REPORT   Patient Name:   Lisa Lang of Exam: 10/25/2021 Medical Rec #:  [id number]:       62.0 in Accession #:    [id number]:       112.8 lb Date of Birth:  6/7/1December 30, 1929 BSA:          1.499 m Patient Age:    89 ye17s      BP:           121/91 mmHg Patient Gender: F             HR:           112 bpm. Exam Location:  Inpatient Procedure: 2D Echo Indications:    mitral regurgitation  History:        Patient has prior history of Echocardiogram examinations, most  recent 12/22/2020. Mitral Valve Prolapse, Arrythmias:Atrial                 Fibrillation; Risk Factors:Dyslipidemia.  Sonographer:    Johny Chess RDCS Referring Phys: [id number] VISHNU P MALLIPEDDI IMPRESSIONS  1. Left ventricular ejection fraction, by estimation, is 65 to 70%. The left ventricle has normal function. The left ventricle has no regional wall motion abnormalities. Left ventricular diastolic function could not be evaluated.  2. Right ventricular systolic function is normal. The right ventricular size is normal. There is moderately elevated pulmonary artery systolic pressure. The estimated right ventricular systolic pressure is 58.0 mmHg.  3. Left atrial size was moderately dilated.  4. Right atrial  size was moderately dilated.  5. 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 The posterior leaflet is dimunitive and there is at least moderate posterior MR. The mitral valve is myxomatous. Moderate mitral valve regurgitation. There is severe prolapse of both leaflets of the mitral valve. Moderate mitral annular calcification.  6. Tricuspid valve regurgitation is moderate.  7. The aortic valve is tricuspid. There is moderate calcification of the aortic valve. Aortic valve regurgitation is mild to moderate. Aortic valve sclerosis/calcification is present, without any evidence of aortic stenosis. FINDINGS  Left Ventricle: Left ventricular ejection fraction, by estimation, is 65 to 70%. The left ventricle has normal function. The left ventricle has no regional wall motion abnormalities. The left ventricular internal cavity size was normal in size. There is  no left ventricular hypertrophy. Left ventricular diastolic function could not be evaluated due to atrial fibrillation. Left ventricular diastolic function could not be evaluated. Right Ventricle: The right ventricular size is normal. No increase in right ventricular wall thickness. Right ventricular systolic function is normal. There is moderately elevated pulmonary artery systolic pressure. The tricuspid regurgitant velocity is 3.63 m/s, and with an assumed right atrial pressure of 5 mmHg, the estimated right ventricular systolic pressure is 99.8 mmHg. Left Atrium: Left atrial size was moderately dilated. Right Atrium: Right atrial size was moderately dilated. Pericardium: There is no evidence of pericardial effusion. 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 The posterior leaflet is dimunitive and there is at least moderate posterior MR. 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 Moderate mitral annular calcification. Moderate mitral valve regurgitation, with  posteriorly-directed jet. Tricuspid Valve: The tricuspid valve is normal in structure. Tricuspid valve regurgitation is moderate. Aortic Valve: The aortic valve is tricuspid. There is moderate calcification of the aortic valve. Aortic valve regurgitation is mild to moderate. Aortic valve sclerosis/calcification is present, without any evidence of aortic stenosis. Pulmonic Valve: The pulmonic valve was normal in structure. Pulmonic valve regurgitation is trivial. Aorta: The aortic root and ascending aorta are structurally normal, with no evidence of dilitation. IAS/Shunts: No atrial level shunt detected by color flow Doppler.  LEFT VENTRICLE PLAX 2D LVIDd:         3.80 cm LVIDs:         2.10 cm LV PW:         0.80 cm LV IVS:        0.90 cm LVOT diam:     2.00 cm LVOT Area:     3.14 cm  RIGHT VENTRICLE         IVC TAPSE (M-mode): 1.5 cm  IVC diam: 2.00 cm LEFT ATRIUM             Index  RIGHT ATRIUM           Index LA diam:        3.80 cm 2.54 cm/m   RA Area:     17.50 cm LA Vol (A2C):   79.6 ml 53.11 ml/m  RA Volume:   48.90 ml  32.63 ml/m LA Vol (A4C):   79.1 ml 52.78 ml/m LA Biplane Vol: 82.2 ml 54.85 ml/m   AORTA Ao Root diam: 2.90 cm Ao Asc diam:  3.10 cm TRICUSPID VALVE TR Peak grad:   52.7 mmHg TR Vmax:        363.00 cm/s  SHUNTS Systemic Diam: 2.00 cm Glori Bickers MD Electronically signed by Glori Bickers MD Signature Date/Time: 10/25/2021/5:56:23 PM    Final    DG Chest 2 View  Result Date: 10/24/2021 CLINICAL DATA:  Shortness of breath. EXAM: CHEST - 2 VIEW COMPARISON:  09/26/2017 chest radiograph FINDINGS: Cardiomegaly noted. Mild bibasilar opacities/atelectasis noted as well as a trace RIGHT pleural effusion. There is no evidence of pneumothorax. No acute bony abnormalities are present. IMPRESSION: 1. Mild bibasilar opacities/atelectasis and trace RIGHT pleural effusion. Pneumonia is not excluded. 2. Cardiomegaly. Electronically Signed   By: Margarette Canada M.D.   On: 10/24/2021 11:45    Disposition   Pt is being discharged home today in good condition.  Follow-up Plans & Appointments     Follow-up Information     Leonie Man, MD Follow up on 11/02/2021.   Specialty: Cardiology Why: at 2:40pm for your follow up appt Contact information: [address] [place] [address] [phone]                   Discharge Medications   Allergies as of 10/29/2021       Reactions   Ciprofloxacin Diarrhea   Lidocaine    unknown   Sulfa Antibiotics    unknown   Amoxicillin Rash        Medication List     TAKE these medications    amiodarone 200 MG tablet Commonly known as: PACERONE Take 1 tablet (200 mg total) by mouth 2 (two) times daily for 7 days, THEN 1 tablet (200 mg total) daily. Start taking on: October 29, 2021   apixaban 2.5 MG Tabs tablet Commonly known as: Eliquis Take 1 tablet (2.5 mg total) by mouth 2 (two) times daily.   atorvastatin 40 MG tablet Commonly known as: LIPITOR Take 40 mg by mouth at bedtime.   calcium carbonate 1250 (500 Ca) MG tablet Commonly known as: OS-CAL - dosed in mg of elemental calcium Take 1 tablet by mouth daily with breakfast.   furosemide 20 MG tablet Commonly known as: Lasix Take 1 tablet (20 mg total) by mouth daily as needed.   metoprolol tartrate 25 MG tablet Commonly known as: LOPRESSOR Take 0.5 tablets (12.5 mg total) by mouth 2 (two) times daily. What changed: additional instructions   MULTIVITAMIN PO Take 1 tablet by mouth daily.   Vitamin D (Ergocalciferol) 50 MCG (2000 UT) Caps Take 2,000 Units by mouth daily. Twice a month           Outstanding Labs/Studies   N/a   Duration of Discharge Encounter   Greater than 30 minutes including physician time.  Signed, Reino Bellis, NP 10/29/2021, 9:33 AM   Patient seen and examined. Agree with assessment and plan.  Patient feels well.  She is now maintaining sinus rhythm and is on amiodarone 20 mg twice  a day with  plans for 7 days prior to decreasing to 200 mg daily.  No chest pain or shortness of breath.  Minimally decreased breath sounds at left base on exam.  2/6 MR murmur at apex.  TEE findings reviewed with Dr. Burt Knack not felt to be good candidate for MitraClip.  Plan DC today with follow-up with Dr. Ellyn Hack.   Troy Sine, MD, [facility] 10/29/2021 9:59 AM

## 2021-10-29 NOTE — Plan of Care (Signed)
Discharge instructions discussed with patient.  Patient instructed on home medications, restrictions, and follow up appointments. Belongings gathered and sent with patient.   Patient discharged via wheelchair by this writer.   

## 2021-10-30 ENCOUNTER — Other Ambulatory Visit (HOSPITAL_COMMUNITY): Payer: Self-pay

## 2021-10-30 ENCOUNTER — Telehealth: Payer: Self-pay | Admitting: Cardiology

## 2021-10-30 ENCOUNTER — Encounter (HOSPITAL_COMMUNITY): Payer: Self-pay | Admitting: Cardiology

## 2021-10-30 NOTE — Telephone Encounter (Signed)
  Pt called back and request to speak with a nurse again

## 2021-10-30 NOTE — Telephone Encounter (Addendum)
Patient stated she received a call at 9:45 this morning telling her to stop taking metoprolol tartrate. There is no notation in her chart stating that. She said she didn't think it was a dream. She denies chest pain, palpitations, lightheadedness, or dizziness. She will contact [facility] to see if they called her post d/c. She said she would call our clinic back with any information.

## 2021-10-30 NOTE — Telephone Encounter (Signed)
Patient stated the pharmacy did not call her. She wants to call her phone company to find out the number that came in at 9:45 this AM.

## 2021-10-30 NOTE — Telephone Encounter (Signed)
   Pt calling back, she said she spoke with someone earlier today instructing her about her medication, she couldn't remember the name of the medication that she need to stop taking.   No notes on file

## 2021-11-02 ENCOUNTER — Encounter: Payer: Self-pay | Admitting: Cardiology

## 2021-11-02 ENCOUNTER — Ambulatory Visit (INDEPENDENT_AMBULATORY_CARE_PROVIDER_SITE_OTHER): Payer: Medicare Other | Admitting: Cardiology

## 2021-11-02 VITALS — BP 119/82 | HR 116 | Ht 62.0 in | Wt 108.0 lb

## 2021-11-02 DIAGNOSIS — I4891 Unspecified atrial fibrillation: Secondary | ICD-10-CM | POA: Diagnosis not present

## 2021-11-02 DIAGNOSIS — I5032 Chronic diastolic (congestive) heart failure: Secondary | ICD-10-CM | POA: Diagnosis not present

## 2021-11-02 DIAGNOSIS — D6869 Other thrombophilia: Secondary | ICD-10-CM

## 2021-11-02 DIAGNOSIS — I38 Endocarditis, valve unspecified: Secondary | ICD-10-CM | POA: Diagnosis not present

## 2021-11-02 DIAGNOSIS — I341 Nonrheumatic mitral (valve) prolapse: Secondary | ICD-10-CM | POA: Diagnosis not present

## 2021-11-02 DIAGNOSIS — I480 Paroxysmal atrial fibrillation: Secondary | ICD-10-CM | POA: Diagnosis not present

## 2021-11-02 DIAGNOSIS — I340 Nonrheumatic mitral (valve) insufficiency: Secondary | ICD-10-CM

## 2021-11-02 MED ORDER — AMIODARONE HCL 200 MG PO TABS: ORAL_TABLET | ORAL | 0 refills | Status: DC

## 2021-11-02 NOTE — Assessment & Plan Note (Signed)
On Eliquis 2.5 mg daily-with age and weight, meets criteria for reduced dose.  Has not missed any doses.  Should be okay for cardioversion.  Just had a TEE regardless.

## 2021-11-02 NOTE — Patient Instructions (Signed)
Medication Instructions:     Starting tonight  until  the  1 day after your cardioversion - take 400 mg  Amiodarone 400 mg ( 2 tablets of 200 mg)  twice a day  then  200 mg twice a day for 7 days Then 200 mg  one tablet daily     *If you need a refill on your cardiac medications before your next appointment, please call your pharmacy*   Lab Work: No labs needed     Testing/Procedures:  Will be schedule for cardioversion - will contact you about day and time - it will be this week- [facility] physician has recommended that you have a Cardioversion (DCCV). Electrical Cardioversion uses a jolt of electricity to your heart either through paddles or wired patches attached to your chest. This is a controlled, usually prescheduled, procedure. Defibrillation is done under light anesthesia in the hospital, and you usually go home the day of the procedure. This is done to get your heart back into a normal rhythm. You are not awake for the procedure. Please see the instruction sheet given to you today.   Follow-Up: At [facility], you and your health needs are our priority.  As part of our continuing mission to provide you with exceptional heart care, we have created designated Provider Care Teams.  These Care Teams include your primary Cardiologist (physician) and Advanced Practice Providers (APPs -  Physician Assistants and Nurse Practitioners) who all work together to provide you with the care you need, when you need it.     Your next appointment:   1 week(s)  The format for your next appointment:   In Person  Provider:   You will follow up in the [facility] located at [facility]. Your provider will be: Roderic Palau, NP or Clint R. Fenton, PA-C   Follow up with Dr Ellyn Hack - 1 to 2 months    Other Instructions       Dear Lisa Lang  You are scheduled for a Cardioversion .  Please arrive at the [facility] (Main Entrance A) at  [facility]: [address] [place], [place] [address].   DIET:  Nothing to eat or drink after midnight except a sip of water with medications (see medication instructions below)  MEDICATION INSTRUCTIONS: {        :1 Continue taking your anticoagulant (blood thinner): Apixaban (Eliquis).  You will need to continue this after your procedure until you are told by your provider that it is safe to stop.    LABS: not needed  FYI:  For your safety, and to allow [place] to monitor your vital signs accurately during the surgery/procedure we request: If you have artificial nails, gel coating, SNS etc, please have those removed prior to your surgery/procedure. Not having the nail coverings /polish removed may result in cancellation or delay of your surgery/procedure.  You must have a responsible person to drive you home and stay in the waiting area during your procedure. Failure to do so could result in cancellation.  Bring your insurance cards.  *Special Note: Every effort is made to have your procedure done on time. Occasionally there are emergencies that occur at the hospital that may cause delays. Please be patient if a delay does occur.

## 2021-11-02 NOTE — Progress Notes (Signed)
Primary Care Provider: Lavone Orn, [place] Cardiologist: Glenetta Hew, MD Electrophysiologist: None  Clinic Note: Chief Complaint  Patient presents with   Hospitalization Follow-up    Patient admitted for A-fib-cardioverted, discharged on amiodarone   Atrial Fibrillation    Unfortunately EKG today shows A-fib-she noted her heart rate was faster.   Mitral Regurgitation    Moderate-severe MR noted with severe MVP.-Not felt to be a MitraClip candidate.   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  I am not sure how much she would be interested in operative mitral valve repair.  For now, she just wants to try to get out of A-fib I would just want to get her out of A-fib we can discuss the valve concerns once she is out of A-fib.  Unfortunately, the posterior leaf of the mitral was too small to allow for capturing part of the leaflet along with a very myxomatous anterior leaflet.  By new SBE prophylaxis guidelines, does not meet criteria for antibiotic prophylaxis.      Relevant Medications   amiodarone (PACERONE) 200 MG tablet   Moderate to severe mitral regurgitation (Chronic)    Probably that MVP with moderate to severe MR pretty stable current echo.  We can cancel the upcoming echo.  Unfortunately not favorable for MitraClip.  I am not sure if she will be in favor of surgical evaluation.  We will discuss this potential referral to CVTS, however I think she may very well be against that which would mean potentially moving toward palliation.  The main portion of this would be to try to maintain sinus rhythm.  Therefore we will TIG with amiodarone and try to get her cardioverted.  Hopefully on amiodarone (100 load) she will stay out of  A-fib.      Relevant Medications   amiodarone (PACERONE) 200 MG tablet   Atrial fibrillation with rapid ventricular response ([place]) - Primary    Very symptomatic while in A-fib, diltiazem did not work and metoprolol was not tolerated. She is on low-dose metoprolol 12.5 mg daily which I think we can probably get rid of with amiodarone on board.  I would like to try to see if we get her back out of A-fib to prevent another exacerbation of CHF.  With MVP and MR, this is simply served exacerbated the MR and she feels very symptomatic.  Plan: Increase amiodarone to 400 mg twice daily (with plan for least 5 to 6 days prior to reduce back down to 200 mg twice daily for a week followed by 200 mg daily) and  schedule Cardioversion (hopefully this week) To avoid CHF I have asked that she take her 20 mg Lasix daily until cardioverted.      Relevant Medications   amiodarone (PACERONE) 200 MG tablet   Other Relevant Orders   EKG 12-Lead   Hypercoagulable state due to paroxysmal atrial fibrillation (HCC) (Chronic)    On Eliquis 2.5 mg daily-with age and weight, meets criteria for reduced dose.  Has not missed any doses.  Should be okay for cardioversion.  Just had a TEE regardless.      Relevant Medications   amiodarone (PACERONE) 200 MG tablet   PAF (paroxysmal atrial fibrillation) - CHA2DS2Vasc = 6. Low dose Eliquis (Chronic)  Unfortunately she is back in A-fib despite recent cardioversion.  I think she may just need a higher amiodarone load.  We will load her up more and try to get her we cardioverted on higher dose of amiodarone which will hopefully help her maintain sinus rhythm.  It is crucial that she maintain sinus rhythm with her MR and MVP.  A-fib makes valvular disease worse, exacerbating CHF symptoms.  While reloading with amiodarone and planning cardioversion, we will have her take her furosemide daily to avoid CHF exacerbation. .      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  Suggestion elevated PA pressures while in A-fib RVR.  She felt much better after cardioversion.  While in the hospital she required IV Lasix.  Now on standing dose of 20 mg Lasix which I would like for her to continue to take until we get her out of A-fib.  Presence of CHF is added to her CHA2DS2-VASc score making score now 6.      Relevant Medications   amiodarone (PACERONE) 200 MG tablet   ===================================  HPI:    Lisa Lang is a 88 y.o. female with a PMH below who presents today for posthospital follow-up at the request of Lavone Orn, MD.  Pertinent PMH: PAF-CHA2DS2-VASc score 6 (CHF, HTN, female, aortic plaque, age x2) MVP with Moderate-Severe MR HLD  I last saw Lisa Lang back on January 13, 2021 in follow-up after an echo done in December showing moderate to severe mitral vegetation with severe prolapse of both leaflets but no significant change.  She denied any symptoms of exertional dyspnea chest pain, irregular heartbeats palpitations.  No suggestion of A-fib or heart failure. => Plan was follow-up echocardiogram May end of the year and monitor for symptoms. Was due for lipid panel check on atorvastatin Referred her echo to Dr. Burt Knack from the structural heart team to evaluate for poss MitraClip => indicated that she does have severe prolapse and at least 3+ MR.  Concern was that her anterior leaflet was very thickened and myxomatous with relatively small posterior leaflet.  Not confident that she would do well with clip.  Recommended TEE if symptoms warranted.  Recent Hospitalizations:  10/24/2021 -> Admitted to [facility]: Sudden onset dyspnea associated with Palpitations.  She was brought [facility], ER via EMS noting chest discomfort, lightheadedness dizziness and overall  weakness.  Thankfully, no missed doses of Eliquis. => Noted to be in A-fib RVR rate 136 bpm. Rate initially controlled with Diltiazem IV, but not cardioversion due to adequate rate control. => Converted to oral diltiazem along with beta-blocker => eventually placed on IV amiodarone and diltiazem discontinued because of persistent tachycardia DCCV performed after TEE on 10/28/2021 Oxygen desaturation to 89-90% noted while on 2 L oxygen.  Noted cough.  IV Lasix for several days (1.2 L output on Final day). => Recommended TEE and cardioversion => patient improved, discharged home PRN Lasix 20 mg.  Reviewed  CV studies:    The following studies were reviewed today: (if available, images/films reviewed: From Epic Chart or Care Everywhere) TTE 10/25/2021:  EF 65 to 70%.  No RWMA.  Mildly elevated PAP estimated 58 mmHg.  Moderate right and left atrial dilation.  Myxomatous mitral valve with bileaflet prolapse.  Posterior leaflet is  diminutive and ambulation is large and myxomatous.  At least moderate posterior more severe prolapse of both leaflets.  Moderate MAC.  Moderate aortic valve calcification with mild to moderate AI.  Sclerosis but no stenosis. TEE 10/28/2021: EF 65 to 70%.  Moderate LA dilation with no LAA thrombus.  Moderate RA dilation.  Mitral valve: Myxomatous with large overriding AML and diminutive PML measuring ~8-9 mm.  Bileaflet prolapse with larger posteriorly directed jet and small anterior jet.  Overall moderate MR (moderate to severe)  => no systolic reversal of the PV inflow, mild to moderate TR.  AOV calcification with sclerosis but no stenosis.  Mild AI. Followed by successful DCCV Not felt to be a candidate for MitraClip  Interval History:   Lisa Lang presents here today for close hospital follow-up stating that for the first couple days after discharge she felt great.  She felt the best she had done in a long time.  Her energy was better her breathing was better and she felt  almost ecstatic about how well she is doing.  Unfortunately today she noted that her heart rate went up a little bit and she started to feel like her energy level is low but down.  Her mood is also somewhat.  She otherwise does not feel that bad.  No where near the same dyspnea and fatigue that she had during her presentation last time.  She is not noting any chest tightness or pressure.  No real exertional dyspnea today.  Just feels tired. She has no sensation to her heart beats palpitations.  Nothing to suggest that she notes that she is in A-fib.  This morning she had no recollection of PND, orthopnea overnight and no edema this morning.  She may feel little lightheaded but no syncope or near syncope.   REVIEWED OF SYSTEMS   Review of Systems  Constitutional:  Positive for malaise/fatigue.  HENT:  Negative for congestion.   Respiratory:  Positive for shortness of breath.        Per HPI  Cardiovascular:  Negative for leg swelling.  Gastrointestinal:  Negative for blood in stool, melena and nausea (Unlike her hospital presentation).  Genitourinary:  Negative for hematuria.  Musculoskeletal:  Negative for falls.  Neurological:  Positive for dizziness (A little dizzy today.) and weakness (Little generalized weakness). Negative for focal weakness.  Psychiatric/Behavioral:  Positive for memory loss. Negative for depression. The patient is not nervous/anxious and does not have insomnia.     I have reviewed and (if needed) personally updated the patient's problem list, medications, allergies, past medical and surgical history, social and family history.   PAST MEDICAL HISTORY   Past Medical History:  Diagnosis Date   Arthritis    Diverticulitis 1995   And 2007   Hyperlipidemia LDL goal <100    Mitral valve prolapse    Bileaflet MVP with moderate MR.   PAF (paroxysmal atrial fibrillation) ([place]) 11/2017   CHA2DS2 VASc score > 3 (Age >33 and female sex), --> Eliquis & Metoprolol     PAST  SURGICAL HISTORY   Past Surgical History:  Procedure Laterality Date   CARDIOVERSION N/A 10/28/2021   Procedure: CARDIOVERSION;  Surgeon: Freada Bergeron, MD;  Location: [facility] ENDOSCOPY;  Service: Cardiovascular;  Laterality: N/A;   CATARACT EXTRACTION, BILATERAL     TEE WITHOUT CARDIOVERSION N/A 10/28/2021   Procedure: TRANSESOPHAGEAL ECHOCARDIOGRAM (TEE);  Surgeon: Freada Bergeron, MD;  Location: [place];  Service: Cardiovascular;  Laterality: N/A;   TRANSTHORACIC  ECHOCARDIOGRAM  3/'19, 9/'19   03/2017: Normal LVET (60-65%).  Gr 1 DD. Mild AI.  Mild MR with bileaflet MVP.  Mild pulm HTN; ECHO 09/27/2017: Normal LVEF w/o RWMA. Diastolic dysfxn with elevated LVEDP.  AoV sclerosis with Mod AI.  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  Severe RA dilation.     TRANSTHORACIC ECHOCARDIOGRAM  12/12/2019    EF 60 to 65%.  No R WMA.  Unable to assess diastolic pressures.  Normal RV pressures.  Mild biatrial enlargement.  Moderate aortic calcification - no AS.  Severe BI-Leaflet MVP with Mod-Severe MR    Immunization History  Administered Date(s) Administered   Influenza, High Dose Seasonal PF 10/09/2018   PFIZER(Purple Top)SARS-COV-2 Vaccination 01/25/2019, 02/15/2019    MEDICATIONS/ALLERGIES   Current Meds  Medication Sig   apixaban (ELIQUIS) 2.5 MG TABS tablet Take 1 tablet (2.5 mg total) by mouth 2 (two) times daily.   atorvastatin (LIPITOR) 40 MG tablet Take 40 mg by mouth at bedtime.   calcium carbonate (OS-CAL - DOSED IN MG OF ELEMENTAL CALCIUM) 1250 MG tablet Take 1 tablet by mouth daily with breakfast.   furosemide (LASIX) 20 MG tablet Take 1 tablet (20 mg total) by mouth daily as needed.   metoprolol tartrate (LOPRESSOR) 25 MG tablet Take 0.5 tablets (12.5 mg total) by mouth 2 (two) times daily.   Multiple Vitamins-Minerals (MULTIVITAMIN PO) Take 1 tablet by mouth daily.   Vitamin D, Ergocalciferol, 2000 units CAPS Take 2,000 Units by mouth  daily. Twice a month   [Changes made today] amiodarone (PACERONE) 200 MG tablet Take 1 tablet (200 mg total) by mouth 2 (two) times daily for 7 days, THEN 1 tablet (200 mg total) daily.    Allergies  Allergen Reactions   Ciprofloxacin Diarrhea   Lidocaine     unknown   Sulfa Antibiotics     unknown   Amoxicillin Rash    SOCIAL HISTORY/FAMILY HISTORY   Reviewed in Epic:  Pertinent findings:  Social History   Tobacco Use   Smoking status: Never   Smokeless tobacco: Never  Substance Use Topics   Alcohol use: No   Drug use: No   Social History   Social History Narrative   Recently widowed November 2019--she had been the primary caregiver for her husband who had progressively worsening dementia.   Now lives alone and is planning to move into a retirement community potentially.  She handled his death well, and felt somewhat liberated having the stress of being caregiver removed.    OBJCTIVE -PE, EKG, labs   Wt Readings from Last 3 Encounters:  11/02/21 108 lb (49 kg)  10/29/21 116 lb 2.9 oz (52.7 kg)  01/13/21 112 lb 12.8 oz (51.2 kg)    Physical Exam: BP 119/82 (BP Location: Left Arm, Patient Position: Sitting, Cuff Size: Normal)   Pulse (!) 116   Ht _0  (1.575 m)   Wt 108 lb (49 kg)   SpO2 98%   BMI 19.75 kg/m  Physical Exam Vitals reviewed.  Constitutional:      General: She is not in acute distress.    Appearance: Normal appearance. She is not ill-appearing or toxic-appearing.     Comments: Somewhat thin and frail elderly woman.  Appears to be in no acute distress.  Just a little upset when she found out she was in A-fib.  HENT:     Head: Normocephalic and atraumatic.  Neck:     Vascular: JVD (7-asymmetry water  with Celine Ahr A waves.) present. No carotid bruit.  Cardiovascular:     Rate and Rhythm: Tachycardia present. Rhythm irregularly irregular.     Chest Wall: PMI is not displaced (Hyperdynamic precordium).     Pulses: Intact distal pulses.     Heart  sounds: S1 normal and S2 normal. A midsystolic click. Murmur heard.     High-pitched blowing plateau holosystolic murmur is present with a grade of 3/6 at the apex radiating to the back.     High-pitched harsh crescendo-decrescendo midsystolic murmur of grade 1/6 is also present at the upper right sternal border radiating to the neck.     No friction rub. No gallop.  Pulmonary:     Effort: Pulmonary effort is normal. No respiratory distress.     Breath sounds: Normal breath sounds. No wheezing, rhonchi or rales.     Comments: Mild basilar crackles with no rales or rhonchi. Musculoskeletal:        General: Tenderness present. No swelling. Normal range of motion.     Cervical back: Normal range of motion and neck supple.  Skin:    General: Skin is warm and dry.     Comments: Mild stasis dermatitis noted bilaterally.  Neurological:     General: No focal deficit present.     Mental Status: She is alert and oriented to person, place, and time.     Gait: Gait normal.  Psychiatric:        Mood and Affect: Mood normal.        Behavior: Behavior normal.        Thought Content: Thought content normal.        Judgment: Judgment normal.     Adult ECG Report  Rate: 116 ;  Rhythm: atrial fibrillation and Rapid ventricular rate.  RBBB with repolarization changes..Otherwise normal intervals and durations. ;   Narrative Interpretation: Unfortunately back in A-fib.  Recent Labs:   03/12/2021: TC 239, TG 152, HDL 54, LDL 158.  Lab Results  Component Value Date   CHOL 221 (H) 09/27/2017   HDL 56 09/27/2017   LDLCALC 152 (H) 09/27/2017   TRIG 66 09/27/2017   CHOLHDL 3.9 09/27/2017   Lab Results  Component Value Date   CREATININE 1.04 (H) 10/29/2021   BUN 22 10/29/2021   NA 138 10/29/2021   K 4.6 10/29/2021   CL 101 10/29/2021   CO2 27 10/29/2021      Latest Ref Rng & Units 10/29/2021    2:51 AM 10/28/2021    5:14 AM 10/27/2021    2:51 AM  CBC  WBC 4.0 - 10.5 K/uL 12.7  12.3  10.5    Hemoglobin 12.0 - 15.0 g/dL 12.8  13.3  13.2   Hematocrit 36.0 - 46.0 % 38.1  40.1  40.3   Platelets 150 - 400 K/uL 217  221  232     No results found for: "HGBA1C" Lab Results  Component Value Date   TSH 3.100 10/24/2021    ================================================== I spent a total of 29 minutes with the patient spent in direct patient consultation.  Additional time spent with chart review  / charting (studies, outside notes, etc): 34 min Total Time: 62 min  Current medicines are reviewed at length with the patient today.  (+/- concerns) - reviewed med plans  Notice: This dictation was prepared with Dragon dictation along with smart phrase technology. Any transcriptional errors that result from this process are unintentional and may not be corrected upon review.  Studies  Ordered:   Orders Placed This Encounter  Procedures   EKG 12-Lead   Meds ordered this encounter  Medications   amiodarone (PACERONE) 200 MG tablet    Sig: Take 2 tablets (400 mg total) by mouth 2 (two) times daily for 5 days, THEN 1 tablet (200 mg total) 2 (two) times daily for 7 days, THEN 1 tablet (200 mg total) daily.    Dispense:  90 tablet    Refill:  0    Patient Instructions / Medication Changes & Studies & Tests Ordered   Patient Instructions  Medication Instructions:     Starting tonight  until  the  1 day after your cardioversion - take 400 mg  Amiodarone 400 mg ( 2 tablets of 200 mg)  twice a day  then  200 mg twice a day for 7 days Then 200 mg  one tablet daily     *If you need a refill on your cardiac medications before your next appointment, please call your pharmacy*   Lab Work: No labs needed     Testing/Procedures:  Will be schedule for cardioversion - will contact you about day and time - it will be this week- [place] physician has recommended that you have a Cardioversion (DCCV). Electrical Cardioversion uses a jolt of electricity to your  heart either through paddles or wired patches attached to your chest. This is a controlled, usually prescheduled, procedure. Defibrillation is done under light anesthesia in the hospital, and you usually go home the day of the procedure. This is done to get your heart back into a normal rhythm. You are not awake for the procedure. Please see the instruction sheet given to you today.   Follow-Up: At [facility], you and your health needs are our priority.  As part of our continuing mission to provide you with exceptional heart care, we have created designated Provider Care Teams.  These Care Teams include your primary Cardiologist (physician) and Advanced Practice Providers (APPs -  Physician Assistants and Nurse Practitioners) who all work together to provide you with the care you need, when you need it.     Your next appointment:   1 week(s)  The format for your next appointment:   In Person  Provider:   You will follow up in the [facility] located at [facility]. Your provider will be: Roderic Palau, NP or Clint R. Fenton, PA-C   Follow up with Dr Ellyn Hack - 1 to 2 months    Other Instructions       Dear Lisa Lang  You are scheduled for a Cardioversion .  Please arrive at the [facility] (Main Entrance A) at [facility]: [address] [place], [place] [address].   DIET:  Nothing to eat or drink after midnight except a sip of water with medications (see medication instructions below)  MEDICATION INSTRUCTIONS: {        :1 Continue taking your anticoagulant (blood thinner): Apixaban (Eliquis).  You will need to continue this after your procedure until you are told by your provider that it is safe to stop.    LABS: not needed  FYI:  For your safety, and to allow [place] to monitor your vital signs accurately during the surgery/procedure we request: If you have artificial nails, gel coating, SNS etc, please have those removed prior to your  surgery/procedure. Not having the nail coverings /polish removed may result in cancellation or delay of your surgery/procedure.  You must have a responsible person to drive you home and stay in the waiting area during your procedure. Failure to do so could result in cancellation.  Bring your insurance cards.  *Special Note: Every effort is made to have your procedure done on time. Occasionally there are emergencies that occur at the hospital that may cause delays. Please be patient if a delay does occur.         Leonie Man, MD, MS Glenetta Hew, M.D., M.S. Interventional Cardiologist  [facility]  Pager # [phone] Phone # [phone] [address] [place], [place] [address]   Thank you for choosing [facility] [place]!!

## 2021-11-02 NOTE — Assessment & Plan Note (Signed)
Unfortunately she is back in A-fib despite recent cardioversion.  I think she may just need a higher amiodarone load.  We will load her up more and try to get her we cardioverted on higher dose of amiodarone which will hopefully help her maintain sinus rhythm.  It is crucial that she maintain sinus rhythm with her MR and MVP.  A-fib makes valvular disease worse, exacerbating CHF symptoms.  While reloading with amiodarone and planning cardioversion, we will have her take her furosemide daily to avoid CHF exacerbation. Lisa Lang

## 2021-11-02 NOTE — Assessment & Plan Note (Signed)
Images reviewed by valve team, not felt to be favorable for MitraClip.  I am not sure how much she would be interested in operative mitral valve repair.  For now, she just wants to try to get out of A-fib I would just want to get her out of A-fib we can discuss the valve concerns once she is out of A-fib.  Unfortunately, the posterior leaf of the mitral was too small to allow for capturing part of the leaflet along with a very myxomatous anterior leaflet.  By new SBE prophylaxis guidelines, does not meet criteria for antibiotic prophylaxis.

## 2021-11-02 NOTE — Assessment & Plan Note (Signed)
Echo showed hyperdynamic LV function with EF ranging from 65 to 70% at least moderate to severe MR with MVP.  Suggestion elevated PA pressures while in A-fib RVR.  She felt much better after cardioversion.  While in the hospital she required IV Lasix.  Now on standing dose of 20 mg Lasix which I would like for her to continue to take until we get her out of A-fib.  Presence of CHF is added to her CHA2DS2-VASc score making score now 6.

## 2021-11-02 NOTE — Assessment & Plan Note (Signed)
Very symptomatic while in A-fib, diltiazem did not work and metoprolol was not tolerated. She is on low-dose metoprolol 12.5 mg daily which I think we can probably get rid of with amiodarone on board.  I would like to try to see if we get her back out of A-fib to prevent another exacerbation of CHF.  With MVP and MR, this is simply served exacerbated the MR and she feels very symptomatic.  Plan:  Increase amiodarone to 400 mg twice daily (with plan for least 5 to 6 days prior to reduce back down to 200 mg twice daily for a week followed by 200 mg daily) and   schedule Cardioversion (hopefully this week)  To avoid CHF I have asked that she take her 20 mg Lasix daily until cardioverted.

## 2021-11-02 NOTE — Assessment & Plan Note (Signed)
Probably that MVP with moderate to severe MR pretty stable current echo.  We can cancel the upcoming echo.  Unfortunately not favorable for MitraClip.  I am not sure if she will be in favor of surgical evaluation.  We will discuss this potential referral to CVTS, however I think she may very well be against that which would mean potentially moving toward palliation.  The main portion of this would be to try to maintain sinus rhythm.  Therefore we will TIG with amiodarone and try to get her cardioverted.  Hopefully on amiodarone (100 load) she will stay out of A-fib.

## 2021-11-03 ENCOUNTER — Other Ambulatory Visit: Payer: Self-pay | Admitting: *Deleted

## 2021-11-03 ENCOUNTER — Telehealth: Payer: Self-pay | Admitting: *Deleted

## 2021-11-03 DIAGNOSIS — I480 Paroxysmal atrial fibrillation: Secondary | ICD-10-CM

## 2021-11-03 NOTE — Telephone Encounter (Signed)
Patient returned call, I advised her of message below.  She verbalized understanding.

## 2021-11-03 NOTE — Telephone Encounter (Signed)
Called  and left message for patient .  Scheduling  Cardioversion.   Procedure appointment for Friday Nov 06, 2021 At 1 pm  need to arrive 12:15 pm  at Entrance A  at [facility].   RN left message for patient to call back to verify she receive the message.

## 2021-11-04 ENCOUNTER — Encounter (HOSPITAL_COMMUNITY): Payer: Self-pay | Admitting: Nurse Practitioner

## 2021-11-04 ENCOUNTER — Ambulatory Visit (HOSPITAL_COMMUNITY)
Admission: RE | Admit: 2021-11-04 | Discharge: 2021-11-04 | Disposition: A | Discharge status: Home / Self Care | Payer: Medicare Other | Source: Ambulatory Visit | Attending: Nurse Practitioner | Admitting: Nurse Practitioner

## 2021-11-04 VITALS — BP 126/66 | HR 104 | Ht 62.0 in | Wt 107.4 lb

## 2021-11-04 DIAGNOSIS — I4819 Other persistent atrial fibrillation: Secondary | ICD-10-CM | POA: Diagnosis not present

## 2021-11-04 DIAGNOSIS — I083 Combined rheumatic disorders of mitral, aortic and tricuspid valves: Secondary | ICD-10-CM | POA: Diagnosis not present

## 2021-11-04 DIAGNOSIS — M1990 Unspecified osteoarthritis, unspecified site: Secondary | ICD-10-CM | POA: Insufficient documentation

## 2021-11-04 DIAGNOSIS — Z7901 Long term (current) use of anticoagulants: Secondary | ICD-10-CM | POA: Insufficient documentation

## 2021-11-04 DIAGNOSIS — D6869 Other thrombophilia: Secondary | ICD-10-CM | POA: Diagnosis not present

## 2021-11-04 DIAGNOSIS — Z79899 Other long term (current) drug therapy: Secondary | ICD-10-CM | POA: Diagnosis not present

## 2021-11-04 DIAGNOSIS — I480 Paroxysmal atrial fibrillation: Secondary | ICD-10-CM | POA: Diagnosis not present

## 2021-11-04 DIAGNOSIS — I4510 Unspecified right bundle-branch block: Secondary | ICD-10-CM | POA: Diagnosis not present

## 2021-11-04 DIAGNOSIS — E785 Hyperlipidemia, unspecified: Secondary | ICD-10-CM | POA: Diagnosis not present

## 2021-11-04 NOTE — Progress Notes (Signed)
Primary Care Physician: Lavone Orn, MD Primary Cardiologist: Dr. Ellyn Hack Primary Electrophysiologist: none Referring Physician: Dr. Michail Sermon Lisa Lang is a 88 y.o. female with a history of paroxysmal  atrial fibrillation,RBBB, mitral valve prolapse with moderate to severe MR, HLD, arthritis, and PAF on Eliquis who was seen 10/24/2021 for the evaluation of afib with RVR  after presenting with symptoms of palpitations with  dyspnea .history as well of severe MVP with moderate to severe MR. This was reviewed with Dr. Burt Knack who was concerned about her anterior leaflet, which was extremely myxomatous/thickened. He was not confident that she would do well with Clip. She was admitted at [facility] from 10/21 to 10/29/21. She was treated with amiodarone gtts and had a successful cardioversion 10/25. 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  When she saw Dr. Ellyn Hack 10/30, she was back in afib. He increased amio load and planned on repeat cardioversion 11/3 and clinic was asked to see pt today prior to cardioversion. Her fluid status is stable. She is tolerating higher doses of amiodarone well. Her neighbor is with her today. She appears to feel well today and lives independently and up to a few weeks ago was driving.   Today, she denies symptoms of palpitations, chest pain, shortness of breath, orthopnea, PND, lower extremity edema, dizziness, presyncope, syncope, snoring, daytime somnolence, bleeding, or neurologic sequela. The patient is tolerating medications without difficulties and is otherwise without complaint today.    Atrial Fibrillation Risk Factors:  she does not have symptoms or diagnosis of sleep apnea.  she does not have a history of rheumatic fever.  she does not have a history of alcohol use.  she has a BMI of Body mass index is 19.64  kg/m.[name] Kitchen Filed Weights   11/04/21 1456  Weight: 48.7 kg    LA size: 3.80   Atrial Fibrillation Management history:  Previous antiarrhythmic drugs: amiodarone   Previous cardioversions: 10/28/21  Previous ablations: none  CHADS2VASC score: 4  Anticoagulation history: eliquis 2.5 mg bid    Past Medical History:  Diagnosis Date   Arthritis    Diverticulitis 1995   And 2007   Hyperlipidemia LDL goal <100    Mitral valve prolapse    Bileaflet MVP with moderate MR.   PAF (paroxysmal atrial fibrillation) ([place]) 11/2017   CHA2DS2 VASc score > 3 (Age >30 and female sex), --> Eliquis & Metoprolol    Past Surgical History:  Procedure Laterality Date   CARDIOVERSION N/A 10/28/2021   Procedure: CARDIOVERSION;  Surgeon: Freada Bergeron, MD;  Location: [facility] ENDOSCOPY;  Service: Cardiovascular;  Laterality: N/A;   CATARACT EXTRACTION, BILATERAL     TEE WITHOUT CARDIOVERSION N/A 10/28/2021   Procedure: TRANSESOPHAGEAL ECHOCARDIOGRAM (TEE);  Surgeon: Freada Bergeron, MD;  Location: [facility] ENDOSCOPY;  Service: Cardiovascular;  Laterality: N/A;   TRANSTHORACIC ECHOCARDIOGRAM  3/'19, 9/'19   03/2017: Normal LVET (60-65%).  Gr 1 DD. Mild AI.  Mild MR with bileaflet MVP.  Mild pulm HTN; ECHO 09/27/2017: Normal LVEF w/o RWMA. Diastolic dysfxn with elevated LVEDP.  AoV sclerosis with Mod AI.  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  Severe RA dilation.     TRANSTHORACIC ECHOCARDIOGRAM  12/12/2019    EF 60 to 65%.  No R WMA.  Unable to assess diastolic pressures.  Normal RV pressures.  Mild biatrial enlargement.  Moderate aortic calcification - no AS.  Severe BI-Leaflet MVP with Mod-Severe MR    Current Outpatient Medications  Medication Sig Dispense Refill   amiodarone (PACERONE) 200 MG tablet Take 2 tablets (400 mg total) by mouth 2 (two) times daily for 5 days, THEN 1 tablet (200 mg total) 2 (two) times daily for 7 days, THEN 1 tablet (200 mg total) daily. 90  tablet 0   apixaban (ELIQUIS) 2.5 MG TABS tablet Take 1 tablet (2.5 mg total) by mouth 2 (two) times daily. 180 tablet 1   atorvastatin (LIPITOR) 40 MG tablet Take 40 mg by mouth at bedtime.     calcium carbonate (OS-CAL - DOSED IN MG OF ELEMENTAL CALCIUM) 1250 MG tablet Take 1 tablet by mouth daily with breakfast.     furosemide (LASIX) 20 MG tablet Take 1 tablet (20 mg total) by mouth daily as needed. 30 tablet 1   metoprolol tartrate (LOPRESSOR) 25 MG tablet Take 0.5 tablets (12.5 mg total) by mouth 2 (two) times daily. 90 tablet 3   Multiple Vitamins-Minerals (MULTIVITAMIN PO) Take 1 tablet by mouth daily.     Vitamin D, Ergocalciferol, 2000 units CAPS Take 2,000 Units by mouth daily. Twice a month     No current facility-administered medications for this encounter.    Allergies  Allergen Reactions   Ciprofloxacin Diarrhea   Lidocaine     unknown   Sulfa Antibiotics     unknown   Amoxicillin Rash    Social History   Socioeconomic History   Marital status: Married    Spouse name: Not on file   Number of children: Not on file   Years of education: Not on file   Highest education level: Not on file  Occupational History   Occupation: Retired  Tobacco Use   Smoking status: Never   Smokeless tobacco: Never  Substance and Sexual Activity   Alcohol use: No   Drug use: No   Sexual activity: Not on file  Other Topics Concern   Not on file  Social History Narrative   Recently widowed November 2019--she had been the primary caregiver for her husband who had progressively worsening dementia.   Now lives alone and is planning to move into a retirement community potentially.  She handled his death well, and felt somewhat liberated having the stress of being caregiver removed.   Social Determinants of Health   Financial Resource Strain: Not on file  Food Insecurity: Not on file  Transportation Needs: Not on file  Physical Activity: Not on file  Stress: Not on file  Social  Connections: Not on file  Intimate Partner Violence: Not on file    Family History  Problem Relation Age of Onset   Cervical cancer Mother    CAD Father    Lung cancer Sister    Stroke Neg Hx    Diabetes Neg Hx    The patient does not have a history of early familial atrial fibrillation or other arrhythmias.  ROS- All systems are reviewed and negative except as per the HPI above.  Physical Exam: Vitals:   11/04/21 1456  BP: 126/66  Pulse: (!) 104  Weight: 48.7 kg  Height: '5\' 2"'$  (1.575 m)    GEN- The patient is well appearing, alert and oriented x 3 today.   Head- normocephalic, atraumatic Eyes-  Sclera clear, conjunctiva pink Ears- hearing intact Oropharynx- clear Neck- supple  Lungs- Clear to ausculation bilaterally, normal work of breathing Heart- Regular rate and rhythm, no  murmurs, rubs or gallops  GI- soft, NT, ND, + BS Extremities- no clubbing, cyanosis, or edema MS- no significant deformity or atrophy Skin- no rash or lesion Psych- euthymic mood, full affect Neuro- strength and sensation are intact  Wt Readings from Last 3 Encounters:  11/04/21 48.7 kg  11/02/21 49 kg  10/29/21 52.7 kg    EKG today demonstrates afib at 104 bpm  Epic records are reviewed at length today  Assessment and Plan:  1. Persistent  atrial fibrillation The patient has Symptomatic persistent atrial fibrillation.  The patients CHAD2VASC score is 4.  she is  appropriately anticoagulated at this time. The patient is adequately rate controlled with metoprolol tartrate 12.5 bid and amiodarone currently at 400 mg bid, she has instructions how to reduce this dose to ultimately 200 mg daily.  Antiarrhythmic therapy to dates has included none other than recent amiodarone.  The patients left atrial size is 3.80 mm.  Additional echo findings include EF 65-70% and the MV is myxomatous there is prolapse of both leaflets. The posterior  leaflet is dimunitive and there is at least moderate  posterior MR. The  mitral valve is myxomatous. Moderate mitral valve regurgitation. There is  severe prolapse of both leaflets  of the mitral valve. Moderate mitral annular calcification.   6. Tricuspid valve regurgitation is moderate.   7. The aortic valve is tricuspid. There is moderate calcification of the  aortic valve. Aortic valve regurgitation is mild to moderate. Aortic valve  sclerosis/calcification is present, without any evidence of aortic  stenosis. . Review of cardioversion discussed today  Her cardioversion is scheduled for Friday 11/3 and details of this were reviewed. She had labs drawn 10/26 and these are acceptable for upcoming cardioversion.   I will see back in one week after cardioversion and then f/u with Dr. Ellyn Hack 12/31/21.  Roderic Palau, NP 11/04/2021 3:44 PM

## 2021-11-04 NOTE — H&P (View-Only) (Signed)
Primary Care Physician: Lavone Orn, MD Primary Cardiologist: Dr. Ellyn Hack Primary Electrophysiologist: none Referring Physician: Dr. Michail Sermon Lisa Lang is a 88 y.o. female with a history of paroxysmal  atrial fibrillation,RBBB, mitral valve prolapse with moderate to severe MR, HLD, arthritis, and PAF on Eliquis who was seen 10/24/2021 for the evaluation of afib with RVR  after presenting with symptoms of palpitations with  dyspnea .history as well of severe MVP with moderate to severe MR. This was reviewed with Dr. Burt Knack who was concerned about her anterior leaflet, which was extremely myxomatous/thickened. He was not confident that she would do well with Clip. She was admitted at [facility] from 10/21 to 10/29/21. She was treated with amiodarone gtts and had a successful cardioversion 10/25. 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  When she saw Dr. Ellyn Hack 10/30, she was back in afib. He increased amio load and planned on repeat cardioversion 11/3 and clinic was asked to see pt today prior to cardioversion. Her fluid status is stable. She is tolerating higher doses of amiodarone well. Her neighbor is with her today. She appears to feel well today and lives independently and up to a few weeks ago was driving.   Today, she denies symptoms of palpitations, chest pain, shortness of breath, orthopnea, PND, lower extremity edema, dizziness, presyncope, syncope, snoring, daytime somnolence, bleeding, or neurologic sequela. The patient is tolerating medications without difficulties and is otherwise without complaint today.    Atrial Fibrillation Risk Factors:  she does not have symptoms or diagnosis of sleep apnea.  she does not have a history of rheumatic fever.  she does not have a history of alcohol use.  she has a BMI of Body mass index is 19.64  kg/m.[name] Kitchen Filed Weights   11/04/21 1456  Weight: 48.7 kg    LA size: 3.80   Atrial Fibrillation Management history:  Previous antiarrhythmic drugs: amiodarone   Previous cardioversions: 10/28/21  Previous ablations: none  CHADS2VASC score: 4  Anticoagulation history: eliquis 2.5 mg bid    Past Medical History:  Diagnosis Date   Arthritis    Diverticulitis 1995   And 2007   Hyperlipidemia LDL goal <100    Mitral valve prolapse    Bileaflet MVP with moderate MR.   PAF (paroxysmal atrial fibrillation) ([place]) 11/2017   CHA2DS2 VASc score > 3 (Age >70 and female sex), --> Eliquis & Metoprolol    Past Surgical History:  Procedure Laterality Date   CARDIOVERSION N/A 10/28/2021   Procedure: CARDIOVERSION;  Surgeon: Freada Bergeron, MD;  Location: [facility] ENDOSCOPY;  Service: Cardiovascular;  Laterality: N/A;   CATARACT EXTRACTION, BILATERAL     TEE WITHOUT CARDIOVERSION N/A 10/28/2021   Procedure: TRANSESOPHAGEAL ECHOCARDIOGRAM (TEE);  Surgeon: Freada Bergeron, MD;  Location: [facility] ENDOSCOPY;  Service: Cardiovascular;  Laterality: N/A;   TRANSTHORACIC ECHOCARDIOGRAM  3/'19, 9/'19   03/2017: Normal LVET (60-65%).  Gr 1 DD. Mild AI.  Mild MR with bileaflet MVP.  Mild pulm HTN; ECHO 09/27/2017: Normal LVEF w/o RWMA. Diastolic dysfxn with elevated LVEDP.  AoV sclerosis with Mod AI.  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  Severe RA dilation.     TRANSTHORACIC ECHOCARDIOGRAM  12/12/2019    EF 60 to 65%.  No R WMA.  Unable to assess diastolic pressures.  Normal RV pressures.  Mild biatrial enlargement.  Moderate aortic calcification - no AS.  Severe BI-Leaflet MVP with Mod-Severe MR    Current Outpatient Medications  Medication Sig Dispense Refill   amiodarone (PACERONE) 200 MG tablet Take 2 tablets (400 mg total) by mouth 2 (two) times daily for 5 days, THEN 1 tablet (200 mg total) 2 (two) times daily for 7 days, THEN 1 tablet (200 mg total) daily. 90  tablet 0   apixaban (ELIQUIS) 2.5 MG TABS tablet Take 1 tablet (2.5 mg total) by mouth 2 (two) times daily. 180 tablet 1   atorvastatin (LIPITOR) 40 MG tablet Take 40 mg by mouth at bedtime.     calcium carbonate (OS-CAL - DOSED IN MG OF ELEMENTAL CALCIUM) 1250 MG tablet Take 1 tablet by mouth daily with breakfast.     furosemide (LASIX) 20 MG tablet Take 1 tablet (20 mg total) by mouth daily as needed. 30 tablet 1   metoprolol tartrate (LOPRESSOR) 25 MG tablet Take 0.5 tablets (12.5 mg total) by mouth 2 (two) times daily. 90 tablet 3   Multiple Vitamins-Minerals (MULTIVITAMIN PO) Take 1 tablet by mouth daily.     Vitamin D, Ergocalciferol, 2000 units CAPS Take 2,000 Units by mouth daily. Twice a month     No current facility-administered medications for this encounter.    Allergies  Allergen Reactions   Ciprofloxacin Diarrhea   Lidocaine     unknown   Sulfa Antibiotics     unknown   Amoxicillin Rash    Social History   Socioeconomic History   Marital status: Married    Spouse name: Not on file   Number of children: Not on file   Years of education: Not on file   Highest education level: Not on file  Occupational History   Occupation: Retired  Tobacco Use   Smoking status: Never   Smokeless tobacco: Never  Substance and Sexual Activity   Alcohol use: No   Drug use: No   Sexual activity: Not on file  Other Topics Concern   Not on file  Social History Narrative   Recently widowed November 2019--she had been the primary caregiver for her husband who had progressively worsening dementia.   Now lives alone and is planning to move into a retirement community potentially.  She handled his death well, and felt somewhat liberated having the stress of being caregiver removed.   Social Determinants of Health   Financial Resource Strain: Not on file  Food Insecurity: Not on file  Transportation Needs: Not on file  Physical Activity: Not on file  Stress: Not on file  Social  Connections: Not on file  Intimate Partner Violence: Not on file    Family History  Problem Relation Age of Onset   Cervical cancer Mother    CAD Father    Lung cancer Sister    Stroke Neg Hx    Diabetes Neg Hx    The patient does not have a history of early familial atrial fibrillation or other arrhythmias.  ROS- All systems are reviewed and negative except as per the HPI above.  Physical Exam: Vitals:   11/04/21 1456  BP: 126/66  Pulse: (!) 104  Weight: 48.7 kg  Height: '5\' 2"'$  (1.575 m)    GEN- The patient is well appearing, alert and oriented x 3 today.   Head- normocephalic, atraumatic Eyes-  Sclera clear, conjunctiva pink Ears- hearing intact Oropharynx- clear Neck- supple  Lungs- Clear to ausculation bilaterally, normal work of breathing Heart- Regular rate and rhythm, no  murmurs, rubs or gallops  GI- soft, NT, ND, + BS Extremities- no clubbing, cyanosis, or edema MS- no significant deformity or atrophy Skin- no rash or lesion Psych- euthymic mood, full affect Neuro- strength and sensation are intact  Wt Readings from Last 3 Encounters:  11/04/21 48.7 kg  11/02/21 49 kg  10/29/21 52.7 kg    EKG today demonstrates afib at 104 bpm  Epic records are reviewed at length today  Assessment and Plan:  1. Persistent  atrial fibrillation The patient has Symptomatic persistent atrial fibrillation.  The patients CHAD2VASC score is 4.  she is  appropriately anticoagulated at this time. The patient is adequately rate controlled with metoprolol tartrate 12.5 bid and amiodarone currently at 400 mg bid, she has instructions how to reduce this dose to ultimately 200 mg daily.  Antiarrhythmic therapy to dates has included none other than recent amiodarone.  The patients left atrial size is 3.80 mm.  Additional echo findings include EF 65-70% and the MV is myxomatous there is prolapse of both leaflets. The posterior  leaflet is dimunitive and there is at least moderate  posterior MR. The  mitral valve is myxomatous. Moderate mitral valve regurgitation. There is  severe prolapse of both leaflets  of the mitral valve. Moderate mitral annular calcification.   6. Tricuspid valve regurgitation is moderate.   7. The aortic valve is tricuspid. There is moderate calcification of the  aortic valve. Aortic valve regurgitation is mild to moderate. Aortic valve  sclerosis/calcification is present, without any evidence of aortic  stenosis. . Review of cardioversion discussed today  Her cardioversion is scheduled for Friday 11/3 and details of this were reviewed. She had labs drawn 10/26 and these are acceptable for upcoming cardioversion.   I will see back in one week after cardioversion and then f/u with Dr. Ellyn Hack 12/31/21.  Roderic Palau, NP 11/04/2021 3:44 PM

## 2021-11-04 NOTE — Patient Instructions (Signed)
Cardioversion scheduled for Friday November 3rd  - Arrive at the [facility] and go to admitting at 1230pm  - Do not eat or drink anything after midnight the night prior to your procedure.  - Take all your morning medication (except diabetic medications) with a sip of water prior to arrival.  - You will not be able to drive home after your procedure.  - Do NOT miss any doses of your blood thinner - if you should miss a dose please notify our office immediately.  - If you feel as if you go back into normal rhythm prior to scheduled cardioversion, please notify our office immediately. If your procedure is canceled in the cardioversion suite you will be charged a cancellation fee.

## 2021-11-05 MED ORDER — AMIODARONE HCL 200 MG PO TABS: ORAL_TABLET | ORAL | 0 refills | Status: DC

## 2021-11-05 NOTE — Telephone Encounter (Signed)
Message sent patient is  running uot of Amiodarone  ' new prescription sent  Patient aware and she also aware of schedule cardioversion for tomorrow.

## 2021-11-06 ENCOUNTER — Ambulatory Visit (HOSPITAL_BASED_OUTPATIENT_CLINIC_OR_DEPARTMENT_OTHER): Payer: Medicare Other | Admitting: Anesthesiology

## 2021-11-06 ENCOUNTER — Encounter (HOSPITAL_COMMUNITY)
Admission: RE | Disposition: A | Discharge status: Home / Self Care | Payer: Self-pay | Source: Ambulatory Visit | Attending: Internal Medicine

## 2021-11-06 ENCOUNTER — Other Ambulatory Visit: Payer: Self-pay

## 2021-11-06 ENCOUNTER — Encounter (HOSPITAL_COMMUNITY): Payer: Self-pay | Admitting: Internal Medicine

## 2021-11-06 ENCOUNTER — Ambulatory Visit (HOSPITAL_COMMUNITY)
Admission: RE | Admit: 2021-11-06 | Discharge: 2021-11-06 | Disposition: A | Discharge status: Home / Self Care | Payer: Medicare Other | Source: Ambulatory Visit | Attending: Internal Medicine | Admitting: Internal Medicine

## 2021-11-06 ENCOUNTER — Ambulatory Visit (HOSPITAL_COMMUNITY): Payer: Medicare Other | Admitting: Anesthesiology

## 2021-11-06 DIAGNOSIS — M1990 Unspecified osteoarthritis, unspecified site: Secondary | ICD-10-CM | POA: Diagnosis not present

## 2021-11-06 DIAGNOSIS — Z7901 Long term (current) use of anticoagulants: Secondary | ICD-10-CM | POA: Diagnosis not present

## 2021-11-06 DIAGNOSIS — I4891 Unspecified atrial fibrillation: Secondary | ICD-10-CM

## 2021-11-06 DIAGNOSIS — I4819 Other persistent atrial fibrillation: Secondary | ICD-10-CM | POA: Diagnosis not present

## 2021-11-06 DIAGNOSIS — I4510 Unspecified right bundle-branch block: Secondary | ICD-10-CM | POA: Insufficient documentation

## 2021-11-06 DIAGNOSIS — I083 Combined rheumatic disorders of mitral, aortic and tricuspid valves: Secondary | ICD-10-CM | POA: Insufficient documentation

## 2021-11-06 DIAGNOSIS — I341 Nonrheumatic mitral (valve) prolapse: Secondary | ICD-10-CM

## 2021-11-06 DIAGNOSIS — I10 Essential (primary) hypertension: Secondary | ICD-10-CM | POA: Insufficient documentation

## 2021-11-06 DIAGNOSIS — I480 Paroxysmal atrial fibrillation: Secondary | ICD-10-CM

## 2021-11-06 HISTORY — PX: CARDIOVERSION: SHX1299

## 2021-11-06 SURGERY — CARDIOVERSION
Anesthesia: General

## 2021-11-06 MED ORDER — SODIUM CHLORIDE 0.9 % IV SOLN: INTRAVENOUS | Status: DC

## 2021-11-06 MED ORDER — PROPOFOL 10 MG/ML IV BOLUS
INTRAVENOUS | Status: DC | PRN
  Administered 2021-11-06: 50 mg via INTRAVENOUS

## 2021-11-06 MED ORDER — HYDROCORTISONE 1 % EX CREA: 1.0000 | TOPICAL_CREAM | Freq: Three times a day (TID) | CUTANEOUS | Status: DC | PRN

## 2021-11-06 NOTE — Anesthesia Procedure Notes (Signed)
Procedure Name: General with mask airway Date/Time: 11/06/2021 1:17 PM  Performed by: Erick Colace, CRNAPre-anesthesia Checklist: Patient identified, Emergency Drugs available, Suction available and Patient being monitored Patient Re-evaluated:Patient Re-evaluated prior to induction Oxygen Delivery Method: Ambu bag Preoxygenation: Pre-oxygenation with 100% oxygen Induction Type: IV induction Dental Injury: Teeth and Oropharynx as per pre-operative assessment

## 2021-11-06 NOTE — Anesthesia Preprocedure Evaluation (Signed)
Anesthesia Evaluation  Patient identified by MRN, date of birth, ID band Patient awake    Reviewed: Allergy & Precautions, H&P , NPO status , Patient's Chart, lab work & pertinent test results  Airway Mallampati: II   Neck ROM: full    Dental   Pulmonary neg pulmonary ROS   breath sounds clear to auscultation       Cardiovascular + dysrhythmias Atrial Fibrillation + Valvular Problems/Murmurs MVP  Rhythm:irregular Rate:Normal     Neuro/Psych    GI/Hepatic   Endo/Other    Renal/GU      Musculoskeletal  (+) Arthritis ,    Abdominal   Peds  Hematology   Anesthesia Other Findings   Reproductive/Obstetrics                             Anesthesia Physical Anesthesia Plan  ASA: 3  Anesthesia Plan: General   Post-op Pain Management:    Induction: Intravenous  PONV Risk Score and Plan: 2 and Propofol infusion and Treatment may vary due to age or medical condition  Airway Management Planned: Mask  Additional Equipment:   Intra-op Plan:   Post-operative Plan:   Informed Consent: I have reviewed the patients History and Physical, chart, labs and discussed the procedure including the risks, benefits and alternatives for the proposed anesthesia with the patient or authorized representative who has indicated his/her understanding and acceptance.     Dental advisory given  Plan Discussed with: CRNA, Anesthesiologist and Surgeon  Anesthesia Plan Comments:        Anesthesia Quick Evaluation

## 2021-11-06 NOTE — Transfer of Care (Signed)
Immediate Anesthesia Transfer of Care Note  Patient: Lisa Lang  Procedure(s) Performed: CARDIOVERSION  Patient Location: Short Stay  Anesthesia Type:MAC  Level of Consciousness: drowsy  Airway & Oxygen Therapy: Patient Spontanous Breathing  Post-op Assessment: Report given to RN and Post -op Vital signs reviewed and stable  Post vital signs: Reviewed and stable  Last Vitals:  Vitals Value Taken Time  BP 138/778   Temp    Pulse 61 11/06/21 1318  Resp 19 11/06/21 1318  SpO2 98 % 11/06/21 1318  Vitals shown include unvalidated device data.  Last Pain:  Vitals:   11/06/21 1233  TempSrc: Temporal  PainSc: 0-No pain         Complications: No notable events documented.

## 2021-11-06 NOTE — Discharge Instructions (Signed)

## 2021-11-06 NOTE — CV Procedure (Signed)
    Electrical Cardioversion Procedure Note Lisa Lang [id number] 03-May-1927  Procedure: Electrical Cardioversion Indications:  Atrial Fibrillation  Time Out: Verified patient identification, verified procedure,medications/allergies/relevent history reviewed, required imaging and test results available.  Performed  Procedure Details  The patient was NPO after midnight. Anesthesia was administered at the beside  by Dr.Hodierne with propofol; no lidocaine used due to listed allergy.  Cardioversion was done with synchronized biphasic defibrillation with AP pads with 200 Joules.  The patient converted to sinus rhythm. The patient tolerated the procedure well   IMPRESSION:  Successful cardioversion of atrial fibrillation    Raed Schalk A Adia Crammer 11/06/2021, 1:20 PM

## 2021-11-06 NOTE — Interval H&P Note (Signed)
History and Physical Interval Note:  11/06/2021 1:04 PM  Lisa Lang  has presented today for surgery, with the diagnosis of afib.  The various methods of treatment have been discussed with the patient and family. After consideration of risks, benefits and other options for treatment, the patient has consented to  Procedure(s): CARDIOVERSION (N/A) as a surgical intervention.  The patient's history has been reviewed, patient examined, no change in status, stable for surgery.  I have reviewed the patient's chart and labs.  Questions were answered to the patient's satisfaction.     Lisa Lang A Peter Daquila

## 2021-11-08 ENCOUNTER — Encounter (HOSPITAL_COMMUNITY): Payer: Self-pay | Admitting: Internal Medicine

## 2021-11-12 ENCOUNTER — Encounter (HOSPITAL_COMMUNITY): Payer: Self-pay | Admitting: Nurse Practitioner

## 2021-11-12 ENCOUNTER — Ambulatory Visit (HOSPITAL_COMMUNITY)
Admission: RE | Admit: 2021-11-12 | Discharge: 2021-11-12 | Disposition: A | Discharge status: Home / Self Care | Payer: Medicare Other | Source: Ambulatory Visit | Attending: Nurse Practitioner | Admitting: Nurse Practitioner

## 2021-11-12 VITALS — BP 152/66 | HR 64 | Ht 62.0 in | Wt 106.4 lb

## 2021-11-12 DIAGNOSIS — I341 Nonrheumatic mitral (valve) prolapse: Secondary | ICD-10-CM | POA: Insufficient documentation

## 2021-11-12 DIAGNOSIS — Z7901 Long term (current) use of anticoagulants: Secondary | ICD-10-CM | POA: Diagnosis not present

## 2021-11-12 DIAGNOSIS — Z79899 Other long term (current) drug therapy: Secondary | ICD-10-CM | POA: Insufficient documentation

## 2021-11-12 DIAGNOSIS — I4891 Unspecified atrial fibrillation: Secondary | ICD-10-CM | POA: Diagnosis not present

## 2021-11-12 DIAGNOSIS — I4510 Unspecified right bundle-branch block: Secondary | ICD-10-CM | POA: Insufficient documentation

## 2021-11-12 DIAGNOSIS — E785 Hyperlipidemia, unspecified: Secondary | ICD-10-CM | POA: Insufficient documentation

## 2021-11-12 DIAGNOSIS — D6869 Other thrombophilia: Secondary | ICD-10-CM | POA: Diagnosis not present

## 2021-11-12 DIAGNOSIS — I4819 Other persistent atrial fibrillation: Secondary | ICD-10-CM | POA: Insufficient documentation

## 2021-11-12 NOTE — Progress Notes (Signed)
Primary Care Physician: Lavone Orn, MD Primary Cardiologist: Dr. Ellyn Hack Primary Electrophysiologist: none Referring Physician: Dr. Michail Sermon Lisa Lang is a 88 y.o. female with a history of paroxysmal  atrial fibrillation,RBBB, mitral valve prolapse with moderate to severe MR, HLD, arthritis, and PAF on Eliquis who was seen 10/24/2021 for the evaluation of afib with RVR  after presenting with symptoms of palpitations with  dyspnea .history as well of severe MVP with moderate to severe MR. This was reviewed with Dr. Burt Knack who was concerned about her anterior leaflet, which was extremely myxomatous/thickened. He was not confident that she would do well with Clip. She was admitted at [facility] from 10/21 to 10/29/21. She was treated with amiodarone gtts and had a successful cardioversion 10/25. 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  When she saw Dr. Ellyn Hack 10/30, she was back in afib. He increased amio load and planned on repeat cardioversion 11/3 and clinic was asked to see pt today prior to cardioversion. Her fluid status is stable. She is tolerating higher doses of amiodarone well. Her neighbor is with her today. She appears to feel well today and lives independently and up to a few weeks ago was driving.   F/u in afib clinic, 11/109/23. She had a successful cardioversion and remains in SR. She feels improved. She will lower amiodarone to 200 mg daily starting tomorrow. She is tolerating amiodarone well.   Today, she denies symptoms of palpitations, chest pain, shortness of breath, orthopnea, PND, lower extremity edema, dizziness, presyncope, syncope, snoring, daytime somnolence, bleeding, or neurologic sequela. The patient is tolerating medications without difficulties and is otherwise without complaint today.    Atrial Fibrillation Risk  Factors:  she does not have symptoms or diagnosis of sleep apnea.  she does not have a history of rheumatic fever.  she does not have a history of alcohol use.  she has a BMI of Body mass index is 19.64 kg/m.[name] Kitchen Filed Weights   11/04/21 1456  Weight: 48.7 kg    LA size: 3.80   Atrial Fibrillation Management history:  Previous antiarrhythmic drugs: amiodarone   Previous cardioversions: 10/28/21  Previous ablations: none  CHADS2VASC score: 4  Anticoagulation history: eliquis 2.5 mg bid    Past Medical History:  Diagnosis Date   Arthritis    Diverticulitis 1995   And 2007   Hyperlipidemia LDL goal <100    Mitral valve prolapse    Bileaflet MVP with moderate MR.   PAF (paroxysmal atrial fibrillation) ([place]) 11/2017   CHA2DS2 VASc score > 3 (Age >27 and female sex), --> Eliquis & Metoprolol    Past Surgical History:  Procedure Laterality Date   CARDIOVERSION N/A 10/28/2021   Procedure: CARDIOVERSION;  Surgeon: Freada Bergeron, MD;  Location: [facility] ENDOSCOPY;  Service: Cardiovascular;  Laterality: N/A;   CATARACT EXTRACTION, BILATERAL     TEE WITHOUT CARDIOVERSION N/A 10/28/2021   Procedure: TRANSESOPHAGEAL ECHOCARDIOGRAM (TEE);  Surgeon: Freada Bergeron, MD;  Location: [facility] ENDOSCOPY;  Service: Cardiovascular;  Laterality: N/A;   TRANSTHORACIC ECHOCARDIOGRAM  3/'19, 9/'19   03/2017: Normal LVET (60-65%).  Gr 1 DD. Mild AI.  Mild MR with bileaflet MVP.  Mild pulm HTN; ECHO 09/27/2017: Normal LVEF w/o RWMA. Diastolic dysfxn with elevated LVEDP.  AoV sclerosis with Mod AI.  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  Severe RA  dilation.     TRANSTHORACIC ECHOCARDIOGRAM  12/12/2019    EF 60 to 65%.  No R WMA.  Unable to assess diastolic pressures.  Normal RV pressures.  Mild biatrial enlargement.  Moderate aortic calcification - no AS.  Severe BI-Leaflet MVP with Mod-Severe MR    Current Outpatient Medications  Medication Sig Dispense Refill    amiodarone (PACERONE) 200 MG tablet Take 2 tablets (400 mg total) by mouth 2 (two) times daily for 5 days, THEN 1 tablet (200 mg total) 2 (two) times daily for 7 days, THEN 1 tablet (200 mg total) daily. 90 tablet 0   apixaban (ELIQUIS) 2.5 MG TABS tablet Take 1 tablet (2.5 mg total) by mouth 2 (two) times daily. 180 tablet 1   atorvastatin (LIPITOR) 40 MG tablet Take 40 mg by mouth at bedtime.     calcium carbonate (OS-CAL - DOSED IN MG OF ELEMENTAL CALCIUM) 1250 MG tablet Take 1 tablet by mouth daily with breakfast.     furosemide (LASIX) 20 MG tablet Take 1 tablet (20 mg total) by mouth daily as needed. 30 tablet 1   metoprolol tartrate (LOPRESSOR) 25 MG tablet Take 0.5 tablets (12.5 mg total) by mouth 2 (two) times daily. 90 tablet 3   Multiple Vitamins-Minerals (MULTIVITAMIN PO) Take 1 tablet by mouth daily.     Vitamin D, Ergocalciferol, 2000 units CAPS Take 2,000 Units by mouth daily. Twice a month     No current facility-administered medications for this encounter.    Allergies  Allergen Reactions   Ciprofloxacin Diarrhea   Lidocaine     unknown   Sulfa Antibiotics     unknown   Amoxicillin Rash    Social History   Socioeconomic History   Marital status: Married    Spouse name: Not on file   Number of children: Not on file   Years of education: Not on file   Highest education level: Not on file  Occupational History   Occupation: Retired  Tobacco Use   Smoking status: Never   Smokeless tobacco: Never  Substance and Sexual Activity   Alcohol use: No   Drug use: No   Sexual activity: Not on file  Other Topics Concern   Not on file  Social History Narrative   Recently widowed November 2019--she had been the primary caregiver for her husband who had progressively worsening dementia.   Now lives alone and is planning to move into a retirement community potentially.  She handled his death well, and felt somewhat liberated having the stress of being caregiver removed.    Social Determinants of Health   Financial Resource Strain: Not on file  Food Insecurity: Not on file  Transportation Needs: Not on file  Physical Activity: Not on file  Stress: Not on file  Social Connections: Not on file  Intimate Partner Violence: Not on file    Family History  Problem Relation Age of Onset   Cervical cancer Mother    CAD Father    Lung cancer Sister    Stroke Neg Hx    Diabetes Neg Hx    The patient does not have a history of early familial atrial fibrillation or other arrhythmias.  ROS- All systems are reviewed and negative except as per the HPI above.  Physical Exam: Vitals:   11/04/21 1456  BP: 126/66  Pulse: (!) 104  Weight: 48.7 kg  Height: '5\' 2"'$  (1.575 m)    GEN- The patient is well appearing, alert and oriented x 3  today.   Head- normocephalic, atraumatic Eyes-  Sclera clear, conjunctiva pink Ears- hearing intact Oropharynx- clear Neck- supple  Lungs- Clear to ausculation bilaterally, normal work of breathing Heart- Regular rate and rhythm, no murmurs, rubs or gallops  GI- soft, NT, ND, + BS Extremities- no clubbing, cyanosis, or edema MS- no significant deformity or atrophy Skin- no rash or lesion Psych- euthymic mood, full affect Neuro- strength and sensation are intact  Wt Readings from Last 3 Encounters:  11/04/21 48.7 kg  11/02/21 49 kg  10/29/21 52.7 kg    EKG today demonstrates  Vent. rate 64 BPM PR interval 252 ms QRS duration 94 ms QT/QTcB 416/429 ms P-R-T axes 76 -83 73 Sinus rhythm with sinus arrhythmia with 1st degree A-V block Left axis deviation Incomplete right bundle branch block Anterior infarct , age undetermined Abnormal ECG When compared with ECG of 06-Nov-2021 13:24, PREVIOUS ECG IS PRESENT  Epic records are reviewed at length today  Assessment and Plan:  1. Persistent  atrial fibrillation The patient had symptomatic persistent atrial fibrillation.  She had a successful cardioversion 11/06/21 .  She remains in SR and feels improved. The patients CHAD2VASC score is 4.  she is  appropriately anticoagulated at this time. Continue eliquis 2.5 mg daily  She will lower amiodarone to 200 mg daily staring tomorrow   F/u with Dr. Ellyn Hack 12/31/21.  Roderic Palau, NP 11/04/2021 3:44 PM

## 2021-11-12 NOTE — Patient Instructions (Signed)
Decrease amiodarone to 200 mg once a day starting tomorrow am  11/13/21

## 2021-11-16 NOTE — Anesthesia Postprocedure Evaluation (Signed)
Anesthesia Post Note  Patient: Lisa Lang  Procedure(s) Performed: CARDIOVERSION     Patient location during evaluation: Cath Lab Anesthesia Type: General Level of consciousness: awake and alert Pain management: pain level controlled Vital Signs Assessment: post-procedure vital signs reviewed and stable Respiratory status: spontaneous breathing, nonlabored ventilation, respiratory function stable and patient connected to nasal cannula oxygen Cardiovascular status: blood pressure returned to baseline and stable Postop Assessment: no apparent nausea or vomiting Anesthetic complications: no   No notable events documented.  Last Vitals:  Vitals:   11/06/21 1330 11/06/21 1341  BP: (!) 107/54 (!) 120/59  Pulse: 60 (!) 59  Resp: 17 16  Temp:    SpO2: 94% 94%    Last Pain:  Vitals:   11/06/21 1341  TempSrc:   PainSc: 0-No pain                 Donn Wilmot S

## 2021-11-20 ENCOUNTER — Other Ambulatory Visit: Payer: Self-pay

## 2021-11-20 ENCOUNTER — Inpatient Hospital Stay (HOSPITAL_COMMUNITY)
Admission: EM | Admit: 2021-11-20 | Discharge: 2021-11-25 | DRG: 854 | Disposition: A | Discharge status: Home / Self Care | Payer: Medicare Other | Attending: Internal Medicine | Admitting: Internal Medicine

## 2021-11-20 ENCOUNTER — Emergency Department (HOSPITAL_COMMUNITY): Payer: Medicare Other

## 2021-11-20 ENCOUNTER — Encounter (HOSPITAL_COMMUNITY): Payer: Self-pay

## 2021-11-20 DIAGNOSIS — E876 Hypokalemia: Secondary | ICD-10-CM | POA: Diagnosis not present

## 2021-11-20 DIAGNOSIS — I700 Atherosclerosis of aorta: Secondary | ICD-10-CM | POA: Diagnosis present

## 2021-11-20 DIAGNOSIS — I4891 Unspecified atrial fibrillation: Secondary | ICD-10-CM | POA: Diagnosis not present

## 2021-11-20 DIAGNOSIS — R748 Abnormal levels of other serum enzymes: Secondary | ICD-10-CM | POA: Diagnosis not present

## 2021-11-20 DIAGNOSIS — Z884 Allergy status to anesthetic agent status: Secondary | ICD-10-CM | POA: Diagnosis not present

## 2021-11-20 DIAGNOSIS — R079 Chest pain, unspecified: Secondary | ICD-10-CM | POA: Diagnosis not present

## 2021-11-20 DIAGNOSIS — R935 Abnormal findings on diagnostic imaging of other abdominal regions, including retroperitoneum: Secondary | ICD-10-CM | POA: Diagnosis not present

## 2021-11-20 DIAGNOSIS — Z5181 Encounter for therapeutic drug level monitoring: Secondary | ICD-10-CM | POA: Diagnosis not present

## 2021-11-20 DIAGNOSIS — K8050 Calculus of bile duct without cholangitis or cholecystitis without obstruction: Secondary | ICD-10-CM | POA: Diagnosis not present

## 2021-11-20 DIAGNOSIS — K8062 Calculus of gallbladder and bile duct with acute cholecystitis without obstruction: Secondary | ICD-10-CM | POA: Diagnosis present

## 2021-11-20 DIAGNOSIS — I340 Nonrheumatic mitral (valve) insufficiency: Secondary | ICD-10-CM | POA: Diagnosis not present

## 2021-11-20 DIAGNOSIS — E785 Hyperlipidemia, unspecified: Secondary | ICD-10-CM | POA: Diagnosis not present

## 2021-11-20 DIAGNOSIS — T68XXXA Hypothermia, initial encounter: Secondary | ICD-10-CM | POA: Diagnosis not present

## 2021-11-20 DIAGNOSIS — Z882 Allergy status to sulfonamides status: Secondary | ICD-10-CM | POA: Diagnosis not present

## 2021-11-20 DIAGNOSIS — A419 Sepsis, unspecified organism: Secondary | ICD-10-CM | POA: Diagnosis not present

## 2021-11-20 DIAGNOSIS — R1031 Right lower quadrant pain: Secondary | ICD-10-CM | POA: Diagnosis not present

## 2021-11-20 DIAGNOSIS — E782 Mixed hyperlipidemia: Secondary | ICD-10-CM | POA: Diagnosis not present

## 2021-11-20 DIAGNOSIS — R1011 Right upper quadrant pain: Secondary | ICD-10-CM | POA: Diagnosis not present

## 2021-11-20 DIAGNOSIS — Z8719 Personal history of other diseases of the digestive system: Secondary | ICD-10-CM

## 2021-11-20 DIAGNOSIS — K838 Other specified diseases of biliary tract: Secondary | ICD-10-CM | POA: Diagnosis not present

## 2021-11-20 DIAGNOSIS — R109 Unspecified abdominal pain: Secondary | ICD-10-CM | POA: Diagnosis not present

## 2021-11-20 DIAGNOSIS — Z7901 Long term (current) use of anticoagulants: Secondary | ICD-10-CM

## 2021-11-20 DIAGNOSIS — Z881 Allergy status to other antibiotic agents status: Secondary | ICD-10-CM

## 2021-11-20 DIAGNOSIS — K5730 Diverticulosis of large intestine without perforation or abscess without bleeding: Secondary | ICD-10-CM | POA: Diagnosis not present

## 2021-11-20 DIAGNOSIS — K82A1 Gangrene of gallbladder in cholecystitis: Secondary | ICD-10-CM | POA: Diagnosis not present

## 2021-11-20 DIAGNOSIS — K8010 Calculus of gallbladder with chronic cholecystitis without obstruction: Secondary | ICD-10-CM | POA: Diagnosis not present

## 2021-11-20 DIAGNOSIS — K810 Acute cholecystitis: Secondary | ICD-10-CM | POA: Diagnosis not present

## 2021-11-20 DIAGNOSIS — I341 Nonrheumatic mitral (valve) prolapse: Secondary | ICD-10-CM | POA: Diagnosis not present

## 2021-11-20 DIAGNOSIS — K5710 Diverticulosis of small intestine without perforation or abscess without bleeding: Secondary | ICD-10-CM | POA: Diagnosis not present

## 2021-11-20 DIAGNOSIS — Z801 Family history of malignant neoplasm of trachea, bronchus and lung: Secondary | ICD-10-CM

## 2021-11-20 DIAGNOSIS — E860 Dehydration: Secondary | ICD-10-CM | POA: Diagnosis present

## 2021-11-20 DIAGNOSIS — I959 Hypotension, unspecified: Secondary | ICD-10-CM | POA: Diagnosis not present

## 2021-11-20 DIAGNOSIS — M1990 Unspecified osteoarthritis, unspecified site: Secondary | ICD-10-CM | POA: Diagnosis not present

## 2021-11-20 DIAGNOSIS — Z8249 Family history of ischemic heart disease and other diseases of the circulatory system: Secondary | ICD-10-CM | POA: Diagnosis not present

## 2021-11-20 DIAGNOSIS — R1084 Generalized abdominal pain: Secondary | ICD-10-CM | POA: Diagnosis not present

## 2021-11-20 DIAGNOSIS — K819 Cholecystitis, unspecified: Secondary | ICD-10-CM

## 2021-11-20 DIAGNOSIS — Z8049 Family history of malignant neoplasm of other genital organs: Secondary | ICD-10-CM

## 2021-11-20 DIAGNOSIS — N3000 Acute cystitis without hematuria: Secondary | ICD-10-CM | POA: Diagnosis not present

## 2021-11-20 DIAGNOSIS — I480 Paroxysmal atrial fibrillation: Secondary | ICD-10-CM | POA: Diagnosis present

## 2021-11-20 DIAGNOSIS — Z880 Allergy status to penicillin: Secondary | ICD-10-CM | POA: Diagnosis not present

## 2021-11-20 DIAGNOSIS — I080 Rheumatic disorders of both mitral and aortic valves: Secondary | ICD-10-CM | POA: Diagnosis present

## 2021-11-20 DIAGNOSIS — R609 Edema, unspecified: Secondary | ICD-10-CM | POA: Diagnosis not present

## 2021-11-20 DIAGNOSIS — K8000 Calculus of gallbladder with acute cholecystitis without obstruction: Secondary | ICD-10-CM | POA: Diagnosis not present

## 2021-11-20 DIAGNOSIS — Z01810 Encounter for preprocedural cardiovascular examination: Secondary | ICD-10-CM | POA: Diagnosis not present

## 2021-11-20 DIAGNOSIS — R509 Fever, unspecified: Secondary | ICD-10-CM | POA: Diagnosis not present

## 2021-11-20 DIAGNOSIS — Z79899 Other long term (current) drug therapy: Secondary | ICD-10-CM | POA: Diagnosis not present

## 2021-11-20 HISTORY — DX: Cardiac arrhythmia, unspecified: I49.9

## 2021-11-20 LAB — URINALYSIS, ROUTINE W REFLEX MICROSCOPIC
Bilirubin Urine: NEGATIVE
Glucose, UA: NEGATIVE mg/dL
Hgb urine dipstick: NEGATIVE
Ketones, ur: NEGATIVE mg/dL
Nitrite: NEGATIVE
Protein, ur: 100 mg/dL — AB
Specific Gravity, Urine: 1.024 (ref 1.005–1.030)
pH: 5 (ref 5.0–8.0)

## 2021-11-20 LAB — CBC WITH DIFFERENTIAL/PLATELET
Abs Immature Granulocytes: 0.1 10*3/uL — ABNORMAL HIGH (ref 0.00–0.07)
Basophils Absolute: 0 10*3/uL (ref 0.0–0.1)
Basophils Relative: 0 %
Eosinophils Absolute: 1.5 10*3/uL — ABNORMAL HIGH (ref 0.0–0.5)
Eosinophils Relative: 7 %
HCT: 46.1 % — ABNORMAL HIGH (ref 36.0–46.0)
Hemoglobin: 14.8 g/dL (ref 12.0–15.0)
Immature Granulocytes: 1 %
Lymphocytes Relative: 2 %
Lymphs Abs: 0.5 10*3/uL — ABNORMAL LOW (ref 0.7–4.0)
MCH: 31.2 pg (ref 26.0–34.0)
MCHC: 32.1 g/dL (ref 30.0–36.0)
MCV: 97.1 fL (ref 80.0–100.0)
Monocytes Absolute: 1.8 10*3/uL — ABNORMAL HIGH (ref 0.1–1.0)
Monocytes Relative: 9 %
Neutro Abs: 17.4 10*3/uL — ABNORMAL HIGH (ref 1.7–7.7)
Neutrophils Relative %: 81 %
Platelets: 195 10*3/uL (ref 150–400)
RBC: 4.75 MIL/uL (ref 3.87–5.11)
RDW: 13.4 % (ref 11.5–15.5)
WBC: 21.3 10*3/uL — ABNORMAL HIGH (ref 4.0–10.5)
nRBC: 0 % (ref 0.0–0.2)

## 2021-11-20 LAB — COMPREHENSIVE METABOLIC PANEL
ALT: 26 U/L (ref 0–44)
AST: 37 U/L (ref 15–41)
Albumin: 3.7 g/dL (ref 3.5–5.0)
Alkaline Phosphatase: 105 U/L (ref 38–126)
Anion gap: 10 (ref 5–15)
BUN: 27 mg/dL — ABNORMAL HIGH (ref 8–23)
CO2: 27 mmol/L (ref 22–32)
Calcium: 9.3 mg/dL (ref 8.9–10.3)
Chloride: 100 mmol/L (ref 98–111)
Creatinine, Ser: 1.06 mg/dL — ABNORMAL HIGH (ref 0.44–1.00)
GFR, Estimated: 49 mL/min — ABNORMAL LOW (ref >60)
Glucose, Bld: 137 mg/dL — ABNORMAL HIGH (ref 70–99)
Potassium: 4 mmol/L (ref 3.5–5.1)
Sodium: 137 mmol/L (ref 135–145)
Total Bilirubin: 1.7 mg/dL — ABNORMAL HIGH (ref 0.3–1.2)
Total Protein: 7 g/dL (ref 6.5–8.1)

## 2021-11-20 LAB — LIPASE, BLOOD: Lipase: 25 U/L (ref 11–51)

## 2021-11-20 MED ORDER — IOHEXOL 300 MG/ML  SOLN
100.0000 mL | Freq: Once | INTRAMUSCULAR | Status: AC | PRN
  Administered 2021-11-20: 100 mL via INTRAVENOUS

## 2021-11-20 MED ORDER — SODIUM CHLORIDE 0.9 % IV SOLN
1.0000 g | Freq: Once | INTRAVENOUS | Status: AC
  Administered 2021-11-20: 1 g via INTRAVENOUS
  Filled 2021-11-20: qty 10

## 2021-11-20 MED ORDER — SODIUM CHLORIDE 0.9 % IV SOLN: INTRAVENOUS | Status: DC

## 2021-11-20 NOTE — ED Provider Notes (Signed)
[facility] DEPT Provider Note   CSN: [id number] Arrival date & time: 11/20/21  2033     History  Chief Complaint  Patient presents with   Abdominal Pain    LAYZA SUMMA is a 88 y.o. female.   Abdominal Pain    And has history of diverticulitis, mitral valve prolapse, paroxysmal A-fib, hyperlipidemia.  She denies any prior abdominal surgeries.  She presents to the ED for evaluation of abdominal pain.  Patient states she started having pain on the right side of her abdomen last evening.  Pain is mostly in the right lower abdomen and has been increasing in intensity.  She denies any nausea or vomiting.  No dysuria.  No prior history of similar symptoms  Home Medications Prior to Admission medications   Medication Sig Start Date End Date Taking? Authorizing Provider  amiodarone (PACERONE) 200 MG tablet Take  200 mg  tablet  twice a day or as directed ( follow instruction from 11/02/21) 11/05/21   Leonie Man, MD  apixaban (ELIQUIS) 2.5 MG TABS tablet Take 1 tablet (2.5 mg total) by mouth 2 (two) times daily. 06/23/21   Leonie Man, MD  atorvastatin (LIPITOR) 40 MG tablet Take 40 mg by mouth at bedtime. 09/05/17   [provider]  calcium carbonate (OS-CAL - DOSED IN MG OF ELEMENTAL CALCIUM) 1250 MG tablet Take 1 tablet by mouth daily with breakfast.    [provider]  furosemide (LASIX) 20 MG tablet Take 1 tablet (20 mg total) by mouth daily as needed. 10/29/21 10/29/22  Cheryln Manly, NP  metoprolol tartrate (LOPRESSOR) 25 MG tablet Take 0.5 tablets (12.5 mg total) by mouth 2 (two) times daily. 10/29/21   Cheryln Manly, NP  Multiple Vitamins-Minerals (MULTIVITAMIN PO) Take 1 tablet by mouth daily.    [provider]  Vitamin D, Ergocalciferol, 2000 units CAPS Take 2,000 Units by mouth daily. Twice a month    [provider]      Allergies    Ciprofloxacin, Lidocaine, Sulfa antibiotics, and  Amoxicillin    Review of Systems   Review of Systems  Gastrointestinal:  Positive for abdominal pain.    Physical Exam Updated Vital Signs BP (!) 115/55   Pulse 61   Temp 98.7 F (37.1 C) (Oral)   Resp (!) 23   SpO2 96%  Physical Exam Vitals and nursing note reviewed.  Constitutional:      General: She is not in acute distress.    Appearance: She is well-developed.  HENT:     Head: Normocephalic and atraumatic.     Right Ear: External ear normal.     Left Ear: External ear normal.  Eyes:     General: No scleral icterus.       Right eye: No discharge.        Left eye: No discharge.     Conjunctiva/sclera: Conjunctivae normal.  Neck:     Trachea: No tracheal deviation.  Cardiovascular:     Rate and Rhythm: Normal rate and regular rhythm.  Pulmonary:     Effort: Pulmonary effort is normal. No respiratory distress.     Breath sounds: Normal breath sounds. No stridor. No wheezing or rales.  Abdominal:     General: Bowel sounds are normal. There is no distension.     Palpations: Abdomen is soft.     Tenderness: There is abdominal tenderness in the right lower quadrant. There is guarding. There is no rebound.  Musculoskeletal:  General: No tenderness or deformity.     Cervical back: Neck supple.  Skin:    General: Skin is warm and dry.     Findings: No rash.  Neurological:     General: No focal deficit present.     Mental Status: She is alert.     Cranial Nerves: No cranial nerve deficit (no facial droop, extraocular movements intact, no slurred speech).     Sensory: No sensory deficit.     Motor: No abnormal muscle tone or seizure activity.     Coordination: Coordination normal.  Psychiatric:        Mood and Affect: Mood normal.     ED Results / Procedures / Treatments   Labs (all labs ordered are listed, but only abnormal results are displayed) Labs Reviewed  COMPREHENSIVE METABOLIC PANEL - Abnormal; Notable for the following components:      Result  Value   Glucose, Bld 137 (*)    BUN 27 (*)    Creatinine, Ser 1.06 (*)    Total Bilirubin 1.7 (*)    GFR, Estimated 49 (*)    All other components within normal limits  CBC WITH DIFFERENTIAL/PLATELET - Abnormal; Notable for the following components:   WBC 21.3 (*)    HCT 46.1 (*)    Neutro Abs 17.4 (*)    Lymphs Abs 0.5 (*)    Monocytes Absolute 1.8 (*)    Eosinophils Absolute 1.5 (*)    Abs Immature Granulocytes 0.10 (*)    All other components within normal limits  URINALYSIS, ROUTINE W REFLEX MICROSCOPIC - Abnormal; Notable for the following components:   Color, Urine AMBER (*)    Protein, ur 100 (*)    Leukocytes,Ua MODERATE (*)    Bacteria, UA RARE (*)    All other components within normal limits  URINE CULTURE  LIPASE, BLOOD    EKG None  Radiology CT ABDOMEN PELVIS W CONTRAST  Result Date: 11/20/2021 CLINICAL DATA:  Worsening right lower quadrant pain for 2 days. 88 year old female. EXAM: CT ABDOMEN AND PELVIS WITH CONTRAST TECHNIQUE: Multidetector CT imaging of the abdomen and pelvis was performed using the standard protocol following bolus administration of intravenous contrast. RADIATION DOSE REDUCTION: This exam was performed according to the departmental dose-optimization program which includes automated exposure control, adjustment of the mA and/or kV according to patient size and/or use of iterative reconstruction technique. CONTRAST:  116m OMNIPAQUE IOHEXOL 300 MG/ML  SOLN COMPARISON:  CT with IV contrast 09/26/2017. FINDINGS: Lower chest: 4 mm chronic left lower lobe nodule in the posterolateral extreme base, stable. No follow-up is needed. There is mild cardiomegaly with a right chamber predominance indicating likely chronic right heart dysfunction. This was seen previously but has slightly worsened since 2019. Calcification partially visible in the aortic valve plane and seen previously. There are heavy calcifications in the descending thoracic aorta. Hepatobiliary:  The liver 16 cm in length and mildly steatotic. There is no mass enhancement. The gallbladder is dilated up to nearly 11 cm, with wall thickening and pericholecystic fluid worrisome for acute cholecystitis. There are several small stones clustered in the fundus. There is increased intrahepatic and extrahepatic biliary dilatation with common bile duct now 10 mm, and there is choledocholithiasis not seen previously. This consists of several stones in the distal common bile duct, at least 4 and possibly as many as 5 stones, largest is 9.2 by 5 mm and the smallest is 4 mm. Pancreas: Partially atrophic but unchanged. Slightly prominent pancreatic duct is  also unchanged. Spleen: Normal. Adrenals/Urinary Tract: There is no adrenal mass. There are few bilateral tiny cortical hypodensities in both kidneys which are too small to characterize. Some of them were present previously but a few of them are new. In any case no follow-up imaging is recommended. For reference seeJACR 2018 Feb; 264-273, Management of the Incidental Renal Mass on CT, [employer] 2021; 814-848, Bosniak Classification of Cystic Renal Masses, Version 2019. There is no urinary stone or obstruction.  No bladder thickening. Stomach/Bowel: The gastric wall is contracted. No small bowel dilatation or inflammation is seen. There is a normal caliber appendix. There is mild fecal stasis. There is advanced sigmoid diverticulosis, mild generalized wall thickening in the sigmoid colon which was seen previously and no findings concerning for acute inflammatory change. Vascular/Lymphatic: Moderate to heavy aortic and branch vessel atherosclerosis. Chronic left pelvic venous congestion. No enlarged lymph nodes. Reproductive: Small sized uterus versus prior supracervical hysterectomy, appearance unchanged. Again noted, there is a thin walled homogeneous uncomplicated cyst in the left adnexa likely arising from the left ovary, today measuring 4.0 x 3.4 cm, previously  2.9 x 3.6 cm, Hounsfield density of 4.3, and prior ultrasound 09/26/2017 demonstrating an anechoic uncomplicated cyst. Other: Aside from mild pericholecystic fluid, no other free fluid is seen. There is no free air, free hemorrhage, abscess or incarcerated hernia. There is a small umbilical fat hernia. Musculoskeletal: Osteopenia and degenerative change lumbar spine. No acute or other significant osseous findings. [place] left femoral head. IMPRESSION: 1. Likely acute cholecystitis, with cholelithiasis and choledocholithiasis, with intrahepatic and extrahepatic biliary dilatation. 2. Constipation and diverticulosis. No bowel obstruction or inflammation. The sigmoid colon wall is chronically thickened but no more than previously. 3. Aortic atherosclerosis.  Chronic left pelvic venous congestion. 4. Cardiomegaly with right chamber predominance, slightly worsened since 2019. 5. 4.0 x 3.4 cm uncomplicated left ovarian cyst, previously 2.9 x 3.6 cm. Continued annual ultrasound follow-up suggested. 6. Tiny renal hypodensities too small to characterize. No follow-up recommended. 7. Small umbilical fat hernia. 8. Osteopenia and degenerative change. Aortic Atherosclerosis (ICD10-I70.0). Electronically Signed   By: Telford Nab M.D.   On: 11/20/2021 23:20    Procedures Procedures    Medications Ordered in ED Medications  0.9 %  sodium chloride infusion ( Intravenous New Bag/Given 11/20/21 2127)  cefTRIAXone (ROCEPHIN) 1 g in sodium chloride 0.9 % 100 mL IVPB (0 g Intravenous Stopped 11/20/21 2331)  iohexol (OMNIPAQUE) 300 MG/ML solution 100 mL (100 mLs Intravenous Contrast Given 11/20/21 2245)    ED Course/ Medical Decision Making/ A&P Clinical Course as of 11/20/21 2359  Fri Nov 20, 2021  2216 CBC with Diff(!) Significant leukocytosis noted [JK]  2217 Comprehensive metabolic panel(!) Creatinine mildly elevated.  Lipase normal [JK]  2217 Urinalysis, Routine w reflex microscopic Urine, Clean  Catch(!) Urinalysis does show 21-50 white blood cells rare bacteria moderate leukocyte esterase [JK]  2342 Case discussed with Dr. Redmond Pulling.  Will see patient in the morning.  Hold her Eliquis for now.  Patient will need GI consultation likely MRCP or ERCP [JK]    Clinical Course User Index [JK] Dorie Rank, MD                           Medical Decision Making Frontal diagnosis includes but not limited to, appendicitis, diverticulitis, pyelonephritis, ureterolithiasis  Problems Addressed: Acute cystitis without hematuria: acute illness or injury Cholecystitis: acute illness or injury that poses a threat to life or bodily  functions Choledocholithiasis: acute illness or injury that poses a threat to life or bodily functions  Amount and/or Complexity of Data Reviewed Labs: ordered. Decision-making details documented in ED Course. Radiology: ordered and independent interpretation performed.  Risk Prescription drug management. Risk Details: Case discussed with general surgery and the hospitalist service   Patient presented with acute abdominal pain.  Labs were notable for significant leukocytosis.  Patient also had urinalysis suggestive of UTI.  Considering her age CT scan was performed and it shows evidence of choledocholithiasis and cholecystitis.  Patient has been started on IV antibiotics and IV fluids.  She has not required any pain medications.  Spoke with Dr. Redmond Pulling general surgery.  I have placed an electronic secure chat consult for gastroenterology.  Have ordered MRCP in the morning.  We will consult the medical service for admission.  Findings and plan discussed with the patient and her daughter.        Final Clinical Impression(s) / ED Diagnoses Final diagnoses:  Choledocholithiasis  Cholecystitis  Acute cystitis without hematuria    Rx / DC Orders ED Discharge Orders     None         Dorie Rank, MD 11/20/21 2359

## 2021-11-20 NOTE — ED Notes (Signed)
Urine at bedside. 

## 2021-11-20 NOTE — ED Triage Notes (Signed)
Pt BIB [facility] from home. Pt co abdominal for 2 days that's getting worse. Pain located in RLQ. Pt tender upon palpation. Fever 99.5. pt reports pain comes and goes in the RLQ Pt aox4 130/62 72 Afib hx 94% 18

## 2021-11-21 ENCOUNTER — Encounter (HOSPITAL_COMMUNITY): Payer: Self-pay | Admitting: Internal Medicine

## 2021-11-21 ENCOUNTER — Inpatient Hospital Stay (HOSPITAL_COMMUNITY): Payer: Medicare Other

## 2021-11-21 DIAGNOSIS — K8050 Calculus of bile duct without cholangitis or cholecystitis without obstruction: Secondary | ICD-10-CM

## 2021-11-21 DIAGNOSIS — K82A1 Gangrene of gallbladder in cholecystitis: Secondary | ICD-10-CM | POA: Diagnosis not present

## 2021-11-21 DIAGNOSIS — R079 Chest pain, unspecified: Secondary | ICD-10-CM | POA: Diagnosis not present

## 2021-11-21 DIAGNOSIS — K8010 Calculus of gallbladder with chronic cholecystitis without obstruction: Secondary | ICD-10-CM | POA: Diagnosis not present

## 2021-11-21 DIAGNOSIS — A419 Sepsis, unspecified organism: Secondary | ICD-10-CM | POA: Diagnosis present

## 2021-11-21 DIAGNOSIS — R509 Fever, unspecified: Secondary | ICD-10-CM | POA: Diagnosis not present

## 2021-11-21 DIAGNOSIS — E782 Mixed hyperlipidemia: Secondary | ICD-10-CM | POA: Diagnosis not present

## 2021-11-21 DIAGNOSIS — N3000 Acute cystitis without hematuria: Secondary | ICD-10-CM | POA: Diagnosis present

## 2021-11-21 DIAGNOSIS — R1011 Right upper quadrant pain: Secondary | ICD-10-CM | POA: Diagnosis not present

## 2021-11-21 DIAGNOSIS — E785 Hyperlipidemia, unspecified: Secondary | ICD-10-CM | POA: Diagnosis present

## 2021-11-21 DIAGNOSIS — R109 Unspecified abdominal pain: Secondary | ICD-10-CM | POA: Diagnosis not present

## 2021-11-21 DIAGNOSIS — K8000 Calculus of gallbladder with acute cholecystitis without obstruction: Secondary | ICD-10-CM | POA: Diagnosis not present

## 2021-11-21 DIAGNOSIS — M1990 Unspecified osteoarthritis, unspecified site: Secondary | ICD-10-CM | POA: Diagnosis present

## 2021-11-21 DIAGNOSIS — Z882 Allergy status to sulfonamides status: Secondary | ICD-10-CM | POA: Diagnosis not present

## 2021-11-21 DIAGNOSIS — Z801 Family history of malignant neoplasm of trachea, bronchus and lung: Secondary | ICD-10-CM | POA: Diagnosis not present

## 2021-11-21 DIAGNOSIS — Z880 Allergy status to penicillin: Secondary | ICD-10-CM | POA: Diagnosis not present

## 2021-11-21 DIAGNOSIS — I341 Nonrheumatic mitral (valve) prolapse: Secondary | ICD-10-CM | POA: Diagnosis not present

## 2021-11-21 DIAGNOSIS — Z881 Allergy status to other antibiotic agents status: Secondary | ICD-10-CM | POA: Diagnosis not present

## 2021-11-21 DIAGNOSIS — E876 Hypokalemia: Secondary | ICD-10-CM | POA: Diagnosis not present

## 2021-11-21 DIAGNOSIS — K838 Other specified diseases of biliary tract: Secondary | ICD-10-CM | POA: Diagnosis not present

## 2021-11-21 DIAGNOSIS — Z7901 Long term (current) use of anticoagulants: Secondary | ICD-10-CM | POA: Diagnosis not present

## 2021-11-21 DIAGNOSIS — E860 Dehydration: Secondary | ICD-10-CM | POA: Diagnosis present

## 2021-11-21 DIAGNOSIS — I340 Nonrheumatic mitral (valve) insufficiency: Secondary | ICD-10-CM | POA: Diagnosis not present

## 2021-11-21 DIAGNOSIS — Z79899 Other long term (current) drug therapy: Secondary | ICD-10-CM | POA: Diagnosis not present

## 2021-11-21 DIAGNOSIS — I480 Paroxysmal atrial fibrillation: Secondary | ICD-10-CM

## 2021-11-21 DIAGNOSIS — K819 Cholecystitis, unspecified: Secondary | ICD-10-CM | POA: Diagnosis not present

## 2021-11-21 DIAGNOSIS — R935 Abnormal findings on diagnostic imaging of other abdominal regions, including retroperitoneum: Secondary | ICD-10-CM | POA: Diagnosis not present

## 2021-11-21 DIAGNOSIS — I700 Atherosclerosis of aorta: Secondary | ICD-10-CM | POA: Diagnosis present

## 2021-11-21 DIAGNOSIS — Z5181 Encounter for therapeutic drug level monitoring: Secondary | ICD-10-CM | POA: Diagnosis not present

## 2021-11-21 DIAGNOSIS — K810 Acute cholecystitis: Secondary | ICD-10-CM | POA: Diagnosis present

## 2021-11-21 DIAGNOSIS — Z01810 Encounter for preprocedural cardiovascular examination: Secondary | ICD-10-CM | POA: Diagnosis not present

## 2021-11-21 DIAGNOSIS — I080 Rheumatic disorders of both mitral and aortic valves: Secondary | ICD-10-CM | POA: Diagnosis present

## 2021-11-21 DIAGNOSIS — Z8719 Personal history of other diseases of the digestive system: Secondary | ICD-10-CM | POA: Diagnosis not present

## 2021-11-21 DIAGNOSIS — R748 Abnormal levels of other serum enzymes: Secondary | ICD-10-CM | POA: Diagnosis not present

## 2021-11-21 DIAGNOSIS — Z884 Allergy status to anesthetic agent status: Secondary | ICD-10-CM | POA: Diagnosis not present

## 2021-11-21 DIAGNOSIS — K5710 Diverticulosis of small intestine without perforation or abscess without bleeding: Secondary | ICD-10-CM | POA: Diagnosis present

## 2021-11-21 DIAGNOSIS — Z8249 Family history of ischemic heart disease and other diseases of the circulatory system: Secondary | ICD-10-CM | POA: Diagnosis not present

## 2021-11-21 DIAGNOSIS — K8062 Calculus of gallbladder and bile duct with acute cholecystitis without obstruction: Secondary | ICD-10-CM | POA: Diagnosis present

## 2021-11-21 DIAGNOSIS — Z8049 Family history of malignant neoplasm of other genital organs: Secondary | ICD-10-CM | POA: Diagnosis not present

## 2021-11-21 DIAGNOSIS — I4891 Unspecified atrial fibrillation: Secondary | ICD-10-CM | POA: Diagnosis not present

## 2021-11-21 LAB — CBC WITH DIFFERENTIAL/PLATELET
Abs Immature Granulocytes: 0.16 10*3/uL — ABNORMAL HIGH (ref 0.00–0.07)
Basophils Absolute: 0.1 10*3/uL (ref 0.0–0.1)
Basophils Relative: 0 %
Eosinophils Absolute: 0 10*3/uL (ref 0.0–0.5)
Eosinophils Relative: 0 %
HCT: 41.4 % (ref 36.0–46.0)
Hemoglobin: 13.1 g/dL (ref 12.0–15.0)
Immature Granulocytes: 1 %
Lymphocytes Relative: 2 %
Lymphs Abs: 0.5 10*3/uL — ABNORMAL LOW (ref 0.7–4.0)
MCH: 31 pg (ref 26.0–34.0)
MCHC: 31.6 g/dL (ref 30.0–36.0)
MCV: 97.9 fL (ref 80.0–100.0)
Monocytes Absolute: 1.8 10*3/uL — ABNORMAL HIGH (ref 0.1–1.0)
Monocytes Relative: 8 %
Neutro Abs: 18.9 10*3/uL — ABNORMAL HIGH (ref 1.7–7.7)
Neutrophils Relative %: 89 %
Platelets: 182 10*3/uL (ref 150–400)
RBC: 4.23 MIL/uL (ref 3.87–5.11)
RDW: 13.6 % (ref 11.5–15.5)
WBC: 21.4 10*3/uL — ABNORMAL HIGH (ref 4.0–10.5)
nRBC: 0 % (ref 0.0–0.2)

## 2021-11-21 LAB — COMPREHENSIVE METABOLIC PANEL
ALT: 26 U/L (ref 0–44)
AST: 33 U/L (ref 15–41)
Albumin: 3 g/dL — ABNORMAL LOW (ref 3.5–5.0)
Alkaline Phosphatase: 91 U/L (ref 38–126)
Anion gap: 8 (ref 5–15)
BUN: 26 mg/dL — ABNORMAL HIGH (ref 8–23)
CO2: 26 mmol/L (ref 22–32)
Calcium: 8.5 mg/dL — ABNORMAL LOW (ref 8.9–10.3)
Chloride: 106 mmol/L (ref 98–111)
Creatinine, Ser: 0.95 mg/dL (ref 0.44–1.00)
GFR, Estimated: 56 mL/min — ABNORMAL LOW (ref >60)
Glucose, Bld: 125 mg/dL — ABNORMAL HIGH (ref 70–99)
Potassium: 3.8 mmol/L (ref 3.5–5.1)
Sodium: 140 mmol/L (ref 135–145)
Total Bilirubin: 1.4 mg/dL — ABNORMAL HIGH (ref 0.3–1.2)
Total Protein: 6.1 g/dL — ABNORMAL LOW (ref 6.5–8.1)

## 2021-11-21 LAB — PROTIME-INR
INR: 1.6 — ABNORMAL HIGH (ref 0.8–1.2)
Prothrombin Time: 18.4 seconds — ABNORMAL HIGH (ref 11.4–15.2)

## 2021-11-21 LAB — BILIRUBIN, DIRECT: Bilirubin, Direct: 0.3 mg/dL — ABNORMAL HIGH (ref 0.0–0.2)

## 2021-11-21 LAB — LACTIC ACID, PLASMA: Lactic Acid, Venous: 1.9 mmol/L (ref 0.5–1.9)

## 2021-11-21 LAB — MAGNESIUM: Magnesium: 1.9 mg/dL (ref 1.7–2.4)

## 2021-11-21 MED ORDER — ACETAMINOPHEN 325 MG PO TABS
650.0000 mg | ORAL_TABLET | Freq: Four times a day (QID) | ORAL | Status: DC | PRN
  Administered 2021-11-21 – 2021-11-22 (×4): 650 mg via ORAL
  Filled 2021-11-21 (×5): qty 2

## 2021-11-21 MED ORDER — ONDANSETRON HCL 4 MG/2ML IJ SOLN: 4.0000 mg | Freq: Four times a day (QID) | INTRAMUSCULAR | Status: DC | PRN

## 2021-11-21 MED ORDER — SODIUM CHLORIDE 0.9 % IV SOLN
2.0000 g | INTRAVENOUS | Status: DC
  Administered 2021-11-21 – 2021-11-24 (×4): 2 g via INTRAVENOUS
  Filled 2021-11-21 (×4): qty 20

## 2021-11-21 MED ORDER — FENTANYL CITRATE PF 50 MCG/ML IJ SOSY: 25.0000 ug | PREFILLED_SYRINGE | INTRAMUSCULAR | Status: DC | PRN

## 2021-11-21 MED ORDER — NALOXONE HCL 0.4 MG/ML IJ SOLN: 0.4000 mg | INTRAMUSCULAR | Status: DC | PRN

## 2021-11-21 MED ORDER — GADOBUTROL 1 MMOL/ML IV SOLN
5.0000 mL | Freq: Once | INTRAVENOUS | Status: AC | PRN
  Administered 2021-11-21: 5 mL via INTRAVENOUS

## 2021-11-21 MED ORDER — METRONIDAZOLE 500 MG/100ML IV SOLN
500.0000 mg | Freq: Two times a day (BID) | INTRAVENOUS | Status: DC
  Administered 2021-11-21 – 2021-11-25 (×8): 500 mg via INTRAVENOUS
  Filled 2021-11-21 (×8): qty 100

## 2021-11-21 MED ORDER — ACETAMINOPHEN 650 MG RE SUPP: 650.0000 mg | Freq: Four times a day (QID) | RECTAL | Status: DC | PRN

## 2021-11-21 NOTE — Progress Notes (Signed)
Patient seen and examined.  Admitted early morning hours by nighttime hospitalist.  See details for H&P.  Case discussed with gastroenterology and surgery.  88 year old very active female with history of paroxysmal A-fib on Eliquis and hyperlipidemia, last dose of Eliquis on 11/17 a.m. presented with about 3 days of progressive right upper quadrant abdominal pain.  She was found to have acute cholecystitis and distended bile duct with normal LFTs.  Admitted with IV antibiotics currently on Rocephin Flagyl.  Pain controlled with pain medications.  Discussed with GI, formal consultation pending.  Discussing whether patient will need ERCP before lap chole given abnormal MRCP.  We will allow clear liquid diet today until GI decisions. Dr. Redmond Pulling from surgery is following, likely lap chole Monday.  Requested cardiology consultation, cardiology to see in formal consultation tomorrow morning.  Patient is acceptable risk to undergo ERCP and or lap chole.   Total time spent: 30 minutes.  Same-day admit.  No charge visit.

## 2021-11-21 NOTE — Progress Notes (Signed)
Mobility Specialist - Progress Note   11/21/21 1116  Mobility  Activity Ambulated independently in hallway  Level of Assistance Modified independent, requires aide device or extra time  Assistive Device None  Distance Ambulated (ft) 100 ft  Range of Motion/Exercises Active  Activity Response Tolerated well  Mobility Referral Yes  $Mobility charge 1 Mobility   Pt was found in bed and agreeable to ambulate. C/o right abdominal pain when getting out of bed and during ambulation. Stated feeling a little weak this morning but eager to be up and ambulating. At EOS returned to bed with necessities in reach and family in room.  Ferd Hibbs Mobility Specialist

## 2021-11-21 NOTE — H&P (Signed)
History and Physical      Lisa Lang UKG:[id number] DOB: 1927-06-09 DOA: 11/20/2021  PCP: Lavone Orn, MD  Patient coming from: home   I have personally briefly reviewed patient's old medical records in [place]  Chief Complaint: abdominal pain  HPI: Lisa Lang is a 88 y.o. female with medical history significant for paroxysmal atrial fibrillation chronically anticoagulated on Eliquis, hyperlipidemia, who is admitted to [facility] on 11/20/2021 with acute cholecystitis in the setting of radiographic evidence of choledocholithiasis after presenting from home to [facility] ED complaining of abdominal pain.   The patient reports to 3 days of progressive sharp right upper quadrant abdominal discomfort, worse with palpation and worse in the AM and immediate postprandial timeframe when attempting to eat or drink anything over that timeframe.  She also notes suprapubic discomfort, which is nonradiating and is not reproducible with palpation over the abdomen.  No recent trauma or travel.  This been associated with intermittent nausea resulting in 2-3 episodes of nonbloody, nonbilious emesis over that timeframe.  No diarrhea, melena, or hematochezia.  No rash.  She reports subjective fever over the last few days, in the absence of chills, full body rigors, or generalized myalgias.  No recent chest pain, palpitations, diaphoresis, dizziness, presyncope, or syncope.  No recent orthopnea, PND, or worsening peripheral edema.  Denies overt dysuria or gross hematuria.  Medical history notable for paroxysmal atrial fibrillation for which she is chronically anticoagulated on Eliquis, with most recent such as occurring on the morning of 11/20/2021.  She recently underwent electrocardioversion on 10/28/2021, and is now on amiodarone.  Pressure reviewed, most recent TTE occurred on 10/23/2021, was notable for LVEF 65 to 70%, no focal motion arise, indeterminate diastolic parameters, normal right  ventricular systolic function, moderately dilated bilateral atria as well as moderate mitral regurgitation and mild to moderate aortic regurgitation.      ED Course:  Vital signs in the ED were notable for the following: Afebrile; heart rate 76-28; systolic blood pressures in the 110s to 130s; respiratory rate 16-23, oxygen saturation 96 to 98% on room air.  Labs were notable for the following: CMP notable for the following: BUN 27, creatinine 1.06 compared to 1.04 on 10/29/2021, total bilirubin 1.7, otherwise, liver enzymes within normal limits.  Lipase 25.  CBC notable for white cell count 21,300 with 81% neutrophils, hemoglobin 14.8, platelet count 195.  Urinalysis notable for 21-50 white blood cells, moderate leukocyte esterase, endoscopes of those cells as well as specific gravity of 1.024.  Urine culture collected prior to initiation of IV antibiotics, as further detailed below.  Imaging and additional notable ED work-up: CT abdomen/pelvis with contrast, per radiology read, shows gallbladder dilated to 11 cm, associated with gallbladder wall thickening, pericholecystic fluid, consistent with acute cholecystitis; additionally, this imaging shows several small stones clustered in the gallbladder fundus as well as evidence of intrahepatic and extrahepatic biliary dilation, with a common bile duct now measuring 10 mm, in addition to showing evidence of radiopaque choledocholithiasis.  No evidence of acute pancreatic inflammation.   I discussed the patient's case with the EDP, we discussed the patient's case with the on-call general surgeon, Dr. Redmond Pulling, Who recommended admission to the hospitalist service for further evaluation management of acute cholecystitis in the setting of suspected choledocholithiasis.  Dr. Redmond Pulling Conveyed that she will formally consult and see the patient in the morning, and also recommended consultation of gastroenterology for coordination of the ERCP given suspected  choledocholithiasis.  Given the  timing of most recent dose of Eliquis, Dr. Redmond Pulling Suspects that surgery for the acute cholecystitis will occur sometime over the next 1 to 2 days, will request that Eliquis be held for now.  Additionally, EDP contacted the on-call [facility] gastroenterologist, requesting formal consultation in the morning for the above.  While in the ED, the following were administered: Rocephin, normal saline at 125 cc/h.  Subsequently, the patient was admitted for further evaluation and management of acute cholecystitis in the setting of choledocholithiasis, with criteria met for sepsis with acute cystitis, in addition to evidence of dehydration.   Review of Systems: As per HPI otherwise 10 point review of systems negative.   Past Medical History:  Diagnosis Date   Arthritis    Diverticulitis 1995   And 2007   Hyperlipidemia LDL goal <100    Mitral valve prolapse    Bileaflet MVP with moderate MR.   PAF (paroxysmal atrial fibrillation) ([place]) 11/2017   CHA2DS2 VASc score > 3 (Age >83 and female sex), --> Eliquis & Metoprolol     Past Surgical History:  Procedure Laterality Date   CARDIOVERSION N/A 10/28/2021   Procedure: CARDIOVERSION;  Surgeon: Freada Bergeron, MD;  Location: [facility] ENDOSCOPY;  Service: Cardiovascular;  Laterality: N/A;   CARDIOVERSION N/A 11/06/2021   Procedure: CARDIOVERSION;  Surgeon: Werner Lean, MD;  Location: [place] ENDOSCOPY;  Service: Cardiovascular;  Laterality: N/A;   CATARACT EXTRACTION, BILATERAL     TEE WITHOUT CARDIOVERSION N/A 10/28/2021   Procedure: TRANSESOPHAGEAL ECHOCARDIOGRAM (TEE);  Surgeon: Freada Bergeron, MD;  Location: [facility] ENDOSCOPY;  Service: Cardiovascular;  Laterality: N/A;   TRANSTHORACIC ECHOCARDIOGRAM  3/'19, 9/'19   03/2017: Normal LVET (60-65%).  Gr 1 DD. Mild AI.  Mild MR with bileaflet MVP.  Mild pulm HTN; ECHO 09/27/2017: Normal LVEF w/o RWMA. Diastolic dysfxn with elevated LVEDP.  AoV sclerosis with Mod AI.   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  Severe RA dilation.     TRANSTHORACIC ECHOCARDIOGRAM  12/12/2019    EF 60 to 65%.  No R WMA.  Unable to assess diastolic pressures.  Normal RV pressures.  Mild biatrial enlargement.  Moderate aortic calcification - no AS.  Severe BI-Leaflet MVP with Mod-Severe MR    Social History:  reports that she has never smoked. She has never used smokeless tobacco. She reports that she does not drink alcohol and does not use drugs.   Allergies  Allergen Reactions   Ciprofloxacin Diarrhea   Lidocaine     unknown   Sulfa Antibiotics     unknown   Amoxicillin Rash    Family History  Problem Relation Age of Onset   Cervical cancer Mother    CAD Father    Lung cancer Sister    Stroke Neg Hx    Diabetes Neg Hx     Family history reviewed and not pertinent    Prior to Admission medications   Medication Sig Start Date End Date Taking? Authorizing Provider  amiodarone (PACERONE) 200 MG tablet Take  200 mg  tablet  twice a day or as directed ( follow instruction from 11/02/21) 11/05/21  Yes Leonie Man, MD  apixaban (ELIQUIS) 2.5 MG TABS tablet Take 1 tablet (2.5 mg total) by mouth 2 (two) times daily. 06/23/21  Yes Leonie Man, MD  atorvastatin (LIPITOR) 40 MG tablet Take 40 mg by mouth at bedtime. 09/05/17  Yes [provider]  calcium carbonate (OS-CAL - DOSED IN MG OF ELEMENTAL  CALCIUM) 1250 MG tablet Take 1 tablet by mouth daily with breakfast.   Yes [provider]  furosemide (LASIX) 20 MG tablet Take 1 tablet (20 mg total) by mouth daily as needed. 10/29/21 10/29/22 Yes Cheryln Manly, NP  metoprolol tartrate (LOPRESSOR) 25 MG tablet Take 0.5 tablets (12.5 mg total) by mouth 2 (two) times daily. 10/29/21  Yes Cheryln Manly, NP  Multiple Vitamins-Minerals (MULTIVITAMIN PO) Take 1 tablet by mouth daily.   Yes [provider]  Vitamin D, Ergocalciferol, 2000 units CAPS Take 2,000  Units by mouth daily.   Yes [provider]     Objective    Physical Exam: Vitals:   11/20/21 2045 11/20/21 2345  BP: (!) 145/60 (!) 115/55  Pulse: 74 61  Resp: 16 (!) 23  Temp: 98.7 F (37.1 C)   TempSrc: Oral   SpO2: 99% 96%    General: appears to be stated age; alert, oriented Skin: warm, dry, no rash Head:  AT/[place] Mouth:  Oral mucosa membranes appear dry, normal dentition Neck: supple; trachea midline Heart:  RRR; did not appreciate any M/R/G Lungs: CTAB, did not appreciate any wheezes, rales, or rhonchi Abdomen: + BS; soft, ND, mild tenderness over the right upper quadrant and epigastrium, in the absence of any associated guarding, rigidity, or rebound tenderness. Vascular: 2+ pedal pulses b/l; 2+ radial pulses b/l Extremities: no peripheral edema, no muscle wasting Neuro: strength and sensation intact in upper and lower extremities b/l     Labs on Admission: I have personally reviewed following labs and imaging studies  CBC: Recent Labs  Lab 11/20/21 2054  WBC 21.3*  NEUTROABS 17.4*  HGB 14.8  HCT 46.1*  MCV 97.1  PLT 841   Basic Metabolic Panel: Recent Labs  Lab 11/20/21 2054  NA 137  K 4.0  CL 100  CO2 27  GLUCOSE 137*  BUN 27*  CREATININE 1.06*  CALCIUM 9.3   GFR: Estimated Creatinine Clearance: 25.8 mL/min (A) (by C-G formula based on SCr of 1.06 mg/dL (H)). Liver Function Tests: Recent Labs  Lab 11/20/21 2054  AST 37  ALT 26  ALKPHOS 105  BILITOT 1.7*  PROT 7.0  ALBUMIN 3.7   Recent Labs  Lab 11/20/21 2054  LIPASE 25   No results for input(s): "AMMONIA" in the last 168 hours. Coagulation Profile: No results for input(s): "INR", "PROTIME" in the last 168 hours. Cardiac Enzymes: No results for input(s): "CKTOTAL", "CKMB", "CKMBINDEX", "TROPONINI" in the last 168 hours. BNP (last 3 results) No results for input(s): "PROBNP" in the last 8760 hours. HbA1C: No results for input(s): "HGBA1C" in the last 72  hours. CBG: No results for input(s): "GLUCAP" in the last 168 hours. Lipid Profile: No results for input(s): "CHOL", "HDL", "LDLCALC", "TRIG", "CHOLHDL", "LDLDIRECT" in the last 72 hours. Thyroid Function Tests: No results for input(s): "TSH", "T4TOTAL", "FREET4", "T3FREE", "THYROIDAB" in the last 72 hours. Anemia Panel: No results for input(s): "VITAMINB12", "FOLATE", "FERRITIN", "TIBC", "IRON", "RETICCTPCT" in the last 72 hours. Urine analysis:    Component Value Date/Time   COLORURINE AMBER (A) 11/20/2021 2054   APPEARANCEUR CLEAR 11/20/2021 2054   LABSPEC 1.024 11/20/2021 2054   PHURINE 5.0 11/20/2021 2054   GLUCOSEU NEGATIVE 11/20/2021 2054   HGBUR NEGATIVE 11/20/2021 2054   BILIRUBINUR NEGATIVE 11/20/2021 2054   [place] NEGATIVE 11/20/2021 2054   PROTEINUR 100 (A) 11/20/2021 2054   UROBILINOGEN 0.2 08/22/2012 0056   NITRITE NEGATIVE 11/20/2021 2054   LEUKOCYTESUR MODERATE (A) 11/20/2021 2054  Radiological Exams on Admission: CT ABDOMEN PELVIS W CONTRAST  Result Date: 11/20/2021 CLINICAL DATA:  Worsening right lower quadrant pain for 2 days. 88 year old female. EXAM: CT ABDOMEN AND PELVIS WITH CONTRAST TECHNIQUE: Multidetector CT imaging of the abdomen and pelvis was performed using the standard protocol following bolus administration of intravenous contrast. RADIATION DOSE REDUCTION: This exam was performed according to the departmental dose-optimization program which includes automated exposure control, adjustment of the mA and/or kV according to patient size and/or use of iterative reconstruction technique. CONTRAST:  149m OMNIPAQUE IOHEXOL 300 MG/ML  SOLN COMPARISON:  CT with IV contrast 09/26/2017. FINDINGS: Lower chest: 4 mm chronic left lower lobe nodule in the posterolateral extreme base, stable. No follow-up is needed. There is mild cardiomegaly with a right chamber predominance indicating likely chronic right heart dysfunction. This was seen previously but has  slightly worsened since 2019. Calcification partially visible in the aortic valve plane and seen previously. There are heavy calcifications in the descending thoracic aorta. Hepatobiliary: The liver 16 cm in length and mildly steatotic. There is no mass enhancement. The gallbladder is dilated up to nearly 11 cm, with wall thickening and pericholecystic fluid worrisome for acute cholecystitis. There are several small stones clustered in the fundus. There is increased intrahepatic and extrahepatic biliary dilatation with common bile duct now 10 mm, and there is choledocholithiasis not seen previously. This consists of several stones in the distal common bile duct, at least 4 and possibly as many as 5 stones, largest is 9.2 by 5 mm and the smallest is 4 mm. Pancreas: Partially atrophic but unchanged. Slightly prominent pancreatic duct is also unchanged. Spleen: Normal. Adrenals/Urinary Tract: There is no adrenal mass. There are few bilateral tiny cortical hypodensities in both kidneys which are too small to characterize. Some of them were present previously but a few of them are new. In any case no follow-up imaging is recommended. For reference seeJACR 2018 Feb; 264-273, Management of the Incidental Renal Mass on CT, RadioGraphics 2021; 814-848, Bosniak Classification of Cystic Renal Masses, Version 2019. There is no urinary stone or obstruction.  No bladder thickening. Stomach/Bowel: The gastric wall is contracted. No small bowel dilatation or inflammation is seen. There is a normal caliber appendix. There is mild fecal stasis. There is advanced sigmoid diverticulosis, mild generalized wall thickening in the sigmoid colon which was seen previously and no findings concerning for acute inflammatory change. Vascular/Lymphatic: Moderate to heavy aortic and branch vessel atherosclerosis. Chronic left pelvic venous congestion. No enlarged lymph nodes. Reproductive: Small sized uterus versus prior supracervical  hysterectomy, appearance unchanged. Again noted, there is a thin walled homogeneous uncomplicated cyst in the left adnexa likely arising from the left ovary, today measuring 4.0 x 3.4 cm, previously 2.9 x 3.6 cm, Hounsfield density of 4.3, and prior ultrasound 09/26/2017 demonstrating an anechoic uncomplicated cyst. Other: Aside from mild pericholecystic fluid, no other free fluid is seen. There is no free air, free hemorrhage, abscess or incarcerated hernia. There is a small umbilical fat hernia. Musculoskeletal: Osteopenia and degenerative change lumbar spine. No acute or other significant osseous findings. [place] left femoral head. IMPRESSION: 1. Likely acute cholecystitis, with cholelithiasis and choledocholithiasis, with intrahepatic and extrahepatic biliary dilatation. 2. Constipation and diverticulosis. No bowel obstruction or inflammation. The sigmoid colon wall is chronically thickened but no more than previously. 3. Aortic atherosclerosis.  Chronic left pelvic venous congestion. 4. Cardiomegaly with right chamber predominance, slightly worsened since 2019. 5. 4.0 x 3.4 cm uncomplicated left ovarian cyst, previously  2.9 x 3.6 cm. Continued annual ultrasound follow-up suggested. 6. Tiny renal hypodensities too small to characterize. No follow-up recommended. 7. Small umbilical fat hernia. 8. Osteopenia and degenerative change. Aortic Atherosclerosis (ICD10-I70.0). Electronically Signed   By: Telford Nab M.D.   On: 11/20/2021 23:20      Assessment/Plan    Principal Problem:   Acute cholecystitis Active Problems:   Paroxysmal atrial fibrillation (HCC)   HLD (hyperlipidemia)   Choledocholithiasis   Sepsis ([place])   Dehydration   Acute cystitis    #) Acute cholecystitis with choledocholithiasis: Diagnosis on the basis of 2 to 3 days of progressive prandial right upper quadrant abdominal discomfort, associated with nausea/chronic with CT abdomen/pelvis showing evidence of acute  cholecystitis in the form interval appointment gallbladder wall thickening and pericholecystic fluid along with gallbladder dilation, associated with radiopaque evidence of choledocholithiasis in the setting of biliary dilation, with common bile duct now 10 mm, as further detailed above.  It is noted, aside from mild elevation in total bilirubin, that liver enzymes, including alkaline phosphatase are otherwise nonelevated.  Clinically, criteria do not appear to be met for ascending cholangitis, in the absence of objective fever, jaundice.  Additionally, no evidence of acute encephalopathy nor evidence of hypotension.  EDP discussed patient's case with on-call general surgery, as above, will formally consult and see the patient in the morning, requesting that Eliquis be held.  Additionally, EDP is contacted on-call [facility] gastroenterology for formal consultation for the morning as well with anticipation of ultimate ERCP given the component of choledocholithiasis, as above.  Received Rocephin in the ED this evening.  will continue Rocephin and add Flagyl for anaerobic coverage.  Of note, blood cultures were not collected prior to initiation of the above IV antibiotics.   Plan: Continuous IV fluids.  Stat lactic acid level.  Continue Rocephin and add IV Flagyl, as above.  General surgery formally consulted, as above.  Holding home Eliquis.  NPO.  Additionally, [facility] urology has been contacted with request for formal consultation in the morning, including consideration for ERCP, as above.  Prn IV fentanyl.  As needed IV Zofran.  Repeat CMP/CBC in the morning.  At [facility] magnesium level.  Check direct bilirubin.  Check INR, preoperative EKG.           #) Acute cystitis in the context of new onset suprapubic discomfort, urinalysis appears consistent with urinary tract infection given significant pyuria, moderate leukocyte esterase, and no evidence of squamous epithelial cells to suggest any contaminated  specimen.  CT abdomen/pelvis showed no evidence of pyelonephritis.  Urine culture collected in the ED this evening prior to initiation of Rocephin, which will be continued.  Plan: Continuous IV fluids check stat lactic acid level.  Follow-up result urine culture.  Continue Rocephin.  Repeat CBC with differential in the morning.          #) Sepsis: Criteria for sepsis met in the setting of multiple suspected underlying infectious process, including acute cholecystitis as well as acute cystitis, as further detailed above, with presenting SIRS criteria met via leukocytosis as well as tachypnea.   We will check stat lactic acid level at this time.  Of note, presentation has not been associated any hypotension.  No evidence of endorgan damage at this time to meet criteria for severe sepsis, although with result of lactic acid level currently pending.  Overall, in the absence of LA level greater than or equal to 4.0, and in the absence of any associated hypotension refractory to  IVF's, there are no indications for administration of a 30 mL/kg IVF bolus at this time.    Of note, urine culture elected prior to initiation of Rocephin.  However, blood cultures were collected prior to initiation of these antibiotics.  We will also check chest x-ray for any additional infectious processes, noting increased risk for basilar pneumonia given new onset abdominal discomfort due to associated risk for diminished respiratory rate effort as a compensatory mechanism for acute abdominal discomfort.   Plan: CBC w/ diff and CMP in AM. Follow for result urine culture.  Continue Rocephin and add IV Flagyl, as above stat lactic acid level.  Continuous IV fluids.  Check chest x-ray.  Further evaluation management of presenting acute cholecystitis with evidence of choledocholithiasis, as above, in addition to further evaluation management of acute cystitis, as further detailed above.                #)  Dehydration: Clinical suspicion for such, including the appearance of dry oral mucous membranes as well as laboratory findings notable for acute prerenal azotemia and UA demonstrating elevated specific gravity.  Appears to be in the setting of   recent increase in GI losses in the form of recent nausea/vomiting with concomitant decline in oral intake over that timeframe.  No e/o associated hypotension.   Plan: Monitor strict I's and O's.  Daily weights.  Repeat CMP in the morning.  Continuous IV fluids, as above.  Prn IV Zofran.  Further evaluation management of presenting acute cholecystitis and acute cystitis, as further detailed above.            #) Paroxysmal atrial fibrillation: Documented history of such. In setting of CHA2DS2-VASc score of  4, there is an indication for chronic anticoagulation for thromboembolic prophylaxis. Consistent with this, patient is chronically anticoagulated on  Eliquis. Home AV nodal blocking regimen: Metoprolol tartrate.  Most recent TTE occurred on 10/25/2021 and was notable for moderately dilated bilateral atria, increasing risk for recurrence of atrial fibrillation, noting that she underwent electrocardioversion on 10/28/2021.  Is also noted that her atrial fibrillation may be valvular in nature, given moderate mitral regurgitation.  Additionally, rhythm control strategy pursued via outpatient amiodarone.  Per discussions with on-call general surgery, Dr. Redmond Pulling, She requests that home Eliquis be held given anticipation of surgical intervention for acute cholecystitis.  Depending upon how long it is wished that this medication be held leading up to surgery, may also consider bridging with heparin drip.   Plan: monitor strict I's & O's and daily weights. CMP/CBC in AM. Check serum mag level.  In the setting of current n.p.o. status, holding home beta-blocker and amiodarone.  Additionally, holding home Eliquis for now, per general surgery recommendation, as above.   Monitor on telemetry.  Check EKG for preoperative evaluation.                 #) Hyperlipidemia: documented h/o such. On high intensity atorvastatin as outpatient.   Plan: In the setting of current n.p.o. status, will hold home statin for now.      DVT prophylaxis: SCD's   Code Status: Full code Family Communication: none Disposition Plan: Per Rounding Team Consults called: EDP discussed patient's case with on-call general surgery, who will formally consult, as further detailed above, and also contacted on-call [facility] gastroenterology requesting formal consultation in the morning, as further detailed above;  Admission status: Inpatient     [place] DO [facility] [place]   11/21/2021, 12:30  AM

## 2021-11-21 NOTE — Progress Notes (Signed)
Consult received  Pt in MRI when rounded this am Long discussion with neighbor  Cholecystitis  +CBD stone On   Needs GI for ERCP Hold eliquis Cards consult for risk stratification  If no ERCP today, can probably have at least full liquid diet  Will see later today or in Am  Alycen Mack M. Redmond Pulling, MD, FACS General, Bariatric, & Minimally Invasive Surgery [facility],  [place]

## 2021-11-21 NOTE — Consult Note (Signed)
[facility] Consultation Note  Referring Provider: [facility] Primary Care Physician:  Lavone Orn, MD  Reason for Consultation:  abdominal pain, fevers  HPI: Lisa Lang is a 88 y.o. female admitted couple days right-sided abdominal pain, fevers, malaise.  Cholecystitis on imaging studies as well as multiple CBD stones.  On apixaban for atrial fibrillation, last dose yesterday.      Past Medical History:  Diagnosis Date   Arthritis    Diverticulitis 1995   And 2007   Hyperlipidemia LDL goal <100    Mitral valve prolapse    Bileaflet MVP with moderate MR.   PAF (paroxysmal atrial fibrillation) ([place]) 11/2017   CHA2DS2 VASc score > 3 (Age >73 and female sex), --> Eliquis & Metoprolol     Past Surgical History:  Procedure Laterality Date   CARDIOVERSION N/A 10/28/2021   Procedure: CARDIOVERSION;  Surgeon: Freada Bergeron, MD;  Location: [facility] ENDOSCOPY;  Service: Cardiovascular;  Laterality: N/A;   CARDIOVERSION N/A 11/06/2021   Procedure: CARDIOVERSION;  Surgeon: Werner Lean, MD;  Location: [place] [facility] ENDOSCOPY;  Service: Cardiovascular;  Laterality: N/A;   CATARACT EXTRACTION, BILATERAL     TEE WITHOUT CARDIOVERSION N/A 10/28/2021   Procedure: TRANSESOPHAGEAL ECHOCARDIOGRAM (TEE);  Surgeon: Freada Bergeron, MD;  Location: [facility] ENDOSCOPY;  Service: Cardiovascular;  Laterality: N/A;   TRANSTHORACIC ECHOCARDIOGRAM  3/'19, 9/'19   03/2017: Normal LVET (60-65%).  Gr 1 DD. Mild AI.  Mild MR with bileaflet MVP.  Mild pulm HTN; ECHO 09/27/2017: Normal LVEF w/o RWMA. Diastolic dysfxn with elevated LVEDP.  AoV sclerosis with Mod AI.  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  Severe RA dilation.     TRANSTHORACIC ECHOCARDIOGRAM  12/12/2019    EF 60 to 65%.  No R WMA.  Unable to assess diastolic pressures.  Normal RV pressures.  Mild biatrial enlargement.  Moderate aortic calcification - no AS.  Severe BI-Leaflet MVP with Mod-Severe MR     Prior to Admission medications   Medication Sig Start Date End Date Taking? Authorizing Provider  amiodarone (PACERONE) 200 MG tablet Take  200 mg  tablet  twice a day or as directed ( follow instruction from 11/02/21) 11/05/21  Yes Leonie Man, MD  apixaban (ELIQUIS) 2.5 MG TABS tablet Take 1 tablet (2.5 mg total) by mouth 2 (two) times daily. 06/23/21  Yes Leonie Man, MD  atorvastatin (LIPITOR) 40 MG tablet Take 40 mg by mouth at bedtime. 09/05/17  Yes [provider]  calcium carbonate (OS-CAL - DOSED IN MG OF ELEMENTAL CALCIUM) 1250 MG tablet Take 1 tablet by mouth daily with breakfast.   Yes [provider]  furosemide (LASIX) 20 MG tablet Take 1 tablet (20 mg total) by mouth daily as needed. 10/29/21 10/29/22 Yes Cheryln Manly, NP  metoprolol tartrate (LOPRESSOR) 25 MG tablet Take 0.5 tablets (12.5 mg total) by mouth 2 (two) times daily. 10/29/21  Yes Cheryln Manly, NP  Multiple Vitamins-Minerals (MULTIVITAMIN PO) Take 1 tablet by mouth daily.   Yes [provider]  Vitamin D, Ergocalciferol, 2000 units CAPS Take 2,000 Units by mouth daily.   Yes [provider]    Current Facility-Administered Medications  Medication Dose Route Frequency Provider Last Rate Last Admin   0.9 %  sodium chloride infusion   Intravenous Continuous Dorie Rank, MD 125 mL/hr at 11/21/21 0726 Infusion Verify at 11/21/21 0726   acetaminophen (TYLENOL) tablet 650 mg  650 mg Oral Q6H PRN Howerter, Larkin Ina  B, DO   650 mg at 11/21/21 1324   Or   acetaminophen (TYLENOL) suppository 650 mg  650 mg Rectal Q6H PRN Howerter, Justin B, DO       cefTRIAXone (ROCEPHIN) 2 g in sodium chloride 0.9 % 100 mL IVPB  2 g Intravenous Q24H Howerter, Justin B, DO       fentaNYL (SUBLIMAZE) injection 25 mcg  25 mcg Intravenous Q2H PRN Howerter, Justin B, DO       metroNIDAZOLE (FLAGYL) IVPB 500 mg  500 mg Intravenous Q12H Howerter, Justin B, DO 100 mL/hr at 11/21/21 1357 500 mg at  11/21/21 1357   naloxone (NARCAN) injection 0.4 mg  0.4 mg Intravenous PRN Howerter, Justin B, DO       ondansetron (ZOFRAN) injection 4 mg  4 mg Intravenous Q6H PRN Howerter, Justin B, DO        Allergies as of 11/20/2021 - Review Complete 11/20/2021  Allergen Reaction Noted   Ciprofloxacin Diarrhea 04/07/2010   Lidocaine  09/26/2017   Sulfa antibiotics  04/07/2010   Amoxicillin Rash 04/07/2010    Family History  Problem Relation Age of Onset   Cervical cancer Mother    CAD Father    Lung cancer Sister    Stroke Neg Hx    Diabetes Neg Hx     Social History   Socioeconomic History   Marital status: Married    Spouse name: Not on file   Number of children: Not on file   Years of education: Not on file   Highest education level: Not on file  Occupational History   Occupation: Retired  Tobacco Use   Smoking status: Never   Smokeless tobacco: Never  Substance and Sexual Activity   Alcohol use: No   Drug use: No   Sexual activity: Not on file  Other Topics Concern   Not on file  Social History Narrative   Recently widowed November 2019--she had been the primary caregiver for her husband who had progressively worsening dementia.   Now lives alone and is planning to move into a retirement community potentially.  She handled his death well, and felt somewhat liberated having the stress of being caregiver removed.   Social Determinants of Health   Financial Resource Strain: Not on file  Food Insecurity: Not on file  Transportation Needs: Not on file  Physical Activity: Not on file  Stress: Not on file  Social Connections: Not on file  Intimate Partner Violence: Not on file    Review of Systems: As per HPI, all others negative  Physical Exam: Vital signs in last 24 hours: Temp:  [98.5 F (36.9 C)-99.3 F (37.4 C)] 98.5 F (36.9 C) (11/18 1221) Pulse Rate:  [61-74] 73 (11/18 1221) Resp:  [16-24] 22 (11/18 1300) BP: (114-145)/(49-64) 116/54 (11/18 1221) SpO2:   [94 %-99 %] 96 % (11/18 1221) Last BM Date : 11/20/21 General:   Alert, younger-appearing than stated age, Well-developed, well-nourished, pleasant and cooperative in NAD Head:  Normocephalic and atraumatic. Eyes:  Sclera clear, no icterus.   Conjunctiva pink. Ears:  Normal auditory acuity. Nose:  No deformity, discharge,  or lesions. Mouth:  No deformity or lesions.  Oropharynx pink & moist. Neck:  Supple; no masses or thyromegaly. Lungs:  Clear throughout to auscultation.   No wheezes, crackles, or rhonchi. No acute distress. Heart:  Regular rate and rhythm; no murmurs, clicks, rubs,  or gallops. Abdomen:  Soft, RUQ tenderness with guarding, and nondistended. No masses, hepatosplenomegaly or hernias  noted. Normal bowel sounds, Msk:  Symmetrical without gross deformities. Normal posture. Pulses:  Normal pulses noted. Extremities:  Without clubbing or edema. Neurologic:  Alert and  oriented x4;  grossly normal neurologically. Skin:  Intact without significant lesions or rashes. Psych:  Alert and cooperative. Normal mood and affect.   Lab Results: Recent Labs    11/20/21 2054 11/21/21 0434  WBC 21.3* 21.4*  HGB 14.8 13.1  HCT 46.1* 41.4  PLT 195 182   BMET Recent Labs    11/20/21 2054 11/21/21 0434  NA 137 140  K 4.0 3.8  CL 100 106  CO2 27 26  GLUCOSE 137* 125*  BUN 27* 26*  CREATININE 1.06* 0.95  CALCIUM 9.3 8.5*   LFT Recent Labs    11/21/21 0434  PROT 6.1*  ALBUMIN 3.0*  AST 33  ALT 26  ALKPHOS 91  BILITOT 1.4*  BILIDIR 0.3*   PT/INR Recent Labs    11/21/21 0434  LABPROT 18.4*  INR 1.6*    Studies/Results: MR ABDOMEN MRCP W WO CONTAST  Result Date: 11/21/2021 CLINICAL DATA:  88 year old female with history of right upper quadrant abdominal pain. Evaluate for potential choledocholithiasis. EXAM: MRI ABDOMEN WITHOUT AND WITH CONTRAST (INCLUDING MRCP) TECHNIQUE: Multiplanar multisequence MR imaging of the abdomen was performed both before and after  the administration of intravenous contrast. Heavily T2-weighted images of the biliary and pancreatic ducts were obtained, and three-dimensional MRCP images were rendered by post processing. CONTRAST:  36m GADAVIST GADOBUTROL 1 MMOL/ML IV SOLN COMPARISON:  No prior abdominal MRI. CT the abdomen and pelvis 11/20/2021. FINDINGS: Lower chest: Cardiomegaly. Hepatobiliary: No suspicious cystic or solid hepatic lesions. Mild intrahepatic biliary ductal dilatation. Common bile duct is dilated measuring up to 11 mm in diameter on MRCP images. Numerous filling defects are noted within the common bile duct measuring up to 9 mm in diameter, compatible with choledocholithiasis. Gallbladder is severely distended. Filling defect lying dependently in the gallbladder, compatible with cholelithiasis. There is also a filling defect in the cystic duct (coronal image 9 of series 3) measuring 7 mm, compatible with a impacted stone. Gallbladder wall is thickened and edematous (4 mm) with trace amount of pericholecystic fluid. Pancreas: 5 mm T1 hypointense, T2 hyperintense, nonenhancing lesion in the tail of the pancreas (axial image 16 of series 4), benign in appearance. No solid-appearing pancreatic mass. No pancreatic ductal dilatation noted on MRCP images. Trace amount of T2 hyperintense fluid adjacent to the distal body and tail of the pancreas. Spleen:  Unremarkable. Adrenals/Urinary Tract: Right kidney is partially imaged, as it is in a low position in the lower abdomen. No suspicious renal lesions. Mild right hydronephrosis. No left-sided hydroureteronephrosis in the visualized portions of the abdomen. Bilateral adrenal glands are normal in appearance. Stomach/Bowel: Visualized portions are unremarkable. Vascular/Lymphatic: Aortic atherosclerosis without definite aneurysm in the visualized abdominal vasculature. No lymphadenopathy noted in the abdomen. Other:  Trace volume of perihepatic ascites. Musculoskeletal: No aggressive  appearing osseous lesions are noted in the visualized portions of the skeleton. IMPRESSION: 1. Today's study is once again positive for cholelithiasis with impacted stone in the cystic duct and evidence of acute cholecystitis, as above. Surgical consultation is recommended. 2. There is also choledocholithiasis with mild intra and extrahepatic biliary ductal dilatation, as above. 3. Trace amount of fluid adjacent to the distal body and tail of the pancreas. Correlation with lipase levels is recommended to exclude the possibility of acute pancreatitis. 4. Trace volume of perihepatic ascites. 5. Low position of the  right kidney, with mild right hydronephrosis. A cause of obstruction is not identified on today's examination, but based on correlation with prior CT examination, this may reflect a UPJ obstruction. 6. Aortic atherosclerosis. Electronically Signed   By: Vinnie Langton M.D.   On: 11/21/2021 09:49   MR 3D Recon At Scanner  Result Date: 11/21/2021 CLINICAL DATA:  88 year old female with history of right upper quadrant abdominal pain. Evaluate for potential choledocholithiasis. EXAM: MRI ABDOMEN WITHOUT AND WITH CONTRAST (INCLUDING MRCP) TECHNIQUE: Multiplanar multisequence MR imaging of the abdomen was performed both before and after the administration of intravenous contrast. Heavily T2-weighted images of the biliary and pancreatic ducts were obtained, and three-dimensional MRCP images were rendered by post processing. CONTRAST:  23m GADAVIST GADOBUTROL 1 MMOL/ML IV SOLN COMPARISON:  No prior abdominal MRI. CT the abdomen and pelvis 11/20/2021. FINDINGS: Lower chest: Cardiomegaly. Hepatobiliary: No suspicious cystic or solid hepatic lesions. Mild intrahepatic biliary ductal dilatation. Common bile duct is dilated measuring up to 11 mm in diameter on MRCP images. Numerous filling defects are noted within the common bile duct measuring up to 9 mm in diameter, compatible with choledocholithiasis.  Gallbladder is severely distended. Filling defect lying dependently in the gallbladder, compatible with cholelithiasis. There is also a filling defect in the cystic duct (coronal image 9 of series 3) measuring 7 mm, compatible with a impacted stone. Gallbladder wall is thickened and edematous (4 mm) with trace amount of pericholecystic fluid. Pancreas: 5 mm T1 hypointense, T2 hyperintense, nonenhancing lesion in the tail of the pancreas (axial image 16 of series 4), benign in appearance. No solid-appearing pancreatic mass. No pancreatic ductal dilatation noted on MRCP images. Trace amount of T2 hyperintense fluid adjacent to the distal body and tail of the pancreas. Spleen:  Unremarkable. Adrenals/Urinary Tract: Right kidney is partially imaged, as it is in a low position in the lower abdomen. No suspicious renal lesions. Mild right hydronephrosis. No left-sided hydroureteronephrosis in the visualized portions of the abdomen. Bilateral adrenal glands are normal in appearance. Stomach/Bowel: Visualized portions are unremarkable. Vascular/Lymphatic: Aortic atherosclerosis without definite aneurysm in the visualized abdominal vasculature. No lymphadenopathy noted in the abdomen. Other:  Trace volume of perihepatic ascites. Musculoskeletal: No aggressive appearing osseous lesions are noted in the visualized portions of the skeleton. IMPRESSION: 1. Today's study is once again positive for cholelithiasis with impacted stone in the cystic duct and evidence of acute cholecystitis, as above. Surgical consultation is recommended. 2. There is also choledocholithiasis with mild intra and extrahepatic biliary ductal dilatation, as above. 3. Trace amount of fluid adjacent to the distal body and tail of the pancreas. Correlation with lipase levels is recommended to exclude the possibility of acute pancreatitis. 4. Trace volume of perihepatic ascites. 5. Low position of the right kidney, with mild right hydronephrosis. A cause of  obstruction is not identified on today's examination, but based on correlation with prior CT examination, this may reflect a UPJ obstruction. 6. Aortic atherosclerosis. Electronically Signed   By: DVinnie LangtonM.D.   On: 11/21/2021 09:49   DG Chest Port 1 View  Result Date: 11/21/2021 CLINICAL DATA:  Sepsis.  Pain and fever. EXAM: PORTABLE CHEST 1 VIEW COMPARISON:  October 24, 2021 FINDINGS: Cardiomegaly. The hila and mediastinum are unchanged. No pneumothorax. No nodules or masses. No focal infiltrates. No cause for sepsis identified. IMPRESSION: No active disease. Electronically Signed   By: DDorise BullionIII M.D.   On: 11/21/2021 09:02   CT ABDOMEN PELVIS W CONTRAST  Result Date:  11/20/2021 CLINICAL DATA:  Worsening right lower quadrant pain for 2 days. 88 year old female. EXAM: CT ABDOMEN AND PELVIS WITH CONTRAST TECHNIQUE: Multidetector CT imaging of the abdomen and pelvis was performed using the standard protocol following bolus administration of intravenous contrast. RADIATION DOSE REDUCTION: This exam was performed according to the departmental dose-optimization program which includes automated exposure control, adjustment of the mA and/or kV according to patient size and/or use of iterative reconstruction technique. CONTRAST:  163m OMNIPAQUE IOHEXOL 300 MG/ML  SOLN COMPARISON:  CT with IV contrast 09/26/2017. FINDINGS: Lower chest: 4 mm chronic left lower lobe nodule in the posterolateral extreme base, stable. No follow-up is needed. There is mild cardiomegaly with a right chamber predominance indicating likely chronic right heart dysfunction. This was seen previously but has slightly worsened since 2019. Calcification partially visible in the aortic valve plane and seen previously. There are heavy calcifications in the descending thoracic aorta. Hepatobiliary: The liver 16 cm in length and mildly steatotic. There is no mass enhancement. The gallbladder is dilated up to nearly 11 cm, with  wall thickening and pericholecystic fluid worrisome for acute cholecystitis. There are several small stones clustered in the fundus. There is increased intrahepatic and extrahepatic biliary dilatation with common bile duct now 10 mm, and there is choledocholithiasis not seen previously. This consists of several stones in the distal common bile duct, at least 4 and possibly as many as 5 stones, largest is 9.2 by 5 mm and the smallest is 4 mm. Pancreas: Partially atrophic but unchanged. Slightly prominent pancreatic duct is also unchanged. Spleen: Normal. Adrenals/Urinary Tract: There is no adrenal mass. There are few bilateral tiny cortical hypodensities in both kidneys which are too small to characterize. Some of them were present previously but a few of them are new. In any case no follow-up imaging is recommended. For reference seeJACR 2018 Feb; 264-273, Management of the Incidental Renal Mass on CT, [employer] 2021; 814-848, Bosniak Classification of Cystic Renal Masses, Version 2019. There is no urinary stone or obstruction.  No bladder thickening. Stomach/Bowel: The gastric wall is contracted. No small bowel dilatation or inflammation is seen. There is a normal caliber appendix. There is mild fecal stasis. There is advanced sigmoid diverticulosis, mild generalized wall thickening in the sigmoid colon which was seen previously and no findings concerning for acute inflammatory change. Vascular/Lymphatic: Moderate to heavy aortic and branch vessel atherosclerosis. Chronic left pelvic venous congestion. No enlarged lymph nodes. Reproductive: Small sized uterus versus prior supracervical hysterectomy, appearance unchanged. Again noted, there is a thin walled homogeneous uncomplicated cyst in the left adnexa likely arising from the left ovary, today measuring 4.0 x 3.4 cm, previously 2.9 x 3.6 cm, Hounsfield density of 4.3, and prior ultrasound 09/26/2017 demonstrating an anechoic uncomplicated cyst. Other: Aside  from mild pericholecystic fluid, no other free fluid is seen. There is no free air, free hemorrhage, abscess or incarcerated hernia. There is a small umbilical fat hernia. Musculoskeletal: Osteopenia and degenerative change lumbar spine. No acute or other significant osseous findings. [place] left femoral head. IMPRESSION: 1. Likely acute cholecystitis, with cholelithiasis and choledocholithiasis, with intrahepatic and extrahepatic biliary dilatation. 2. Constipation and diverticulosis. No bowel obstruction or inflammation. The sigmoid colon wall is chronically thickened but no more than previously. 3. Aortic atherosclerosis.  Chronic left pelvic venous congestion. 4. Cardiomegaly with right chamber predominance, slightly worsened since 2019. 5. 4.0 x 3.4 cm uncomplicated left ovarian cyst, previously 2.9 x 3.6 cm. Continued annual ultrasound follow-up suggested. 6. Tiny renal  hypodensities too small to characterize. No follow-up recommended. 7. Small umbilical fat hernia. 8. Osteopenia and degenerative change. Aortic Atherosclerosis (ICD10-I70.0). Electronically Signed   By: Telford Nab M.D.   On: 11/20/2021 23:20    Impression:   Acute cholecystitis.  This is principal source of patient's pain and leukocytosis and fevers. CBD stones.  LFTs nearly normal. Chronic anticoagulation, Atrial fibrillation, apixaban, last dose yesterday.  Plan:   Antibiotics. Clear liquid diet. Follow LFTs. Hold apixaban. Will discuss case with Dr. Lyndel Safe (ERCP physician over weekend) for timing of ERCP, tomorrow versus Monday. Surgical team following. [facility] will follow.   LOS: 0 days   Alphonza Tramell M  11/21/2021, 2:56 PM  Cell [phone] If no answer or after 5 PM call [phone]

## 2021-11-22 DIAGNOSIS — Z7901 Long term (current) use of anticoagulants: Secondary | ICD-10-CM

## 2021-11-22 DIAGNOSIS — Z01810 Encounter for preprocedural cardiovascular examination: Secondary | ICD-10-CM

## 2021-11-22 DIAGNOSIS — K810 Acute cholecystitis: Secondary | ICD-10-CM | POA: Diagnosis not present

## 2021-11-22 DIAGNOSIS — Z5181 Encounter for therapeutic drug level monitoring: Secondary | ICD-10-CM

## 2021-11-22 DIAGNOSIS — I480 Paroxysmal atrial fibrillation: Secondary | ICD-10-CM | POA: Diagnosis not present

## 2021-11-22 DIAGNOSIS — I341 Nonrheumatic mitral (valve) prolapse: Secondary | ICD-10-CM | POA: Diagnosis not present

## 2021-11-22 LAB — CBC WITH DIFFERENTIAL/PLATELET
Abs Immature Granulocytes: 0.2 10*3/uL — ABNORMAL HIGH (ref 0.00–0.07)
Basophils Absolute: 0 10*3/uL (ref 0.0–0.1)
Basophils Relative: 0 %
Eosinophils Absolute: 0 10*3/uL (ref 0.0–0.5)
Eosinophils Relative: 0 %
HCT: 38.8 % (ref 36.0–46.0)
Hemoglobin: 12.3 g/dL (ref 12.0–15.0)
Immature Granulocytes: 1 %
Lymphocytes Relative: 2 %
Lymphs Abs: 0.4 10*3/uL — ABNORMAL LOW (ref 0.7–4.0)
MCH: 31.5 pg (ref 26.0–34.0)
MCHC: 31.7 g/dL (ref 30.0–36.0)
MCV: 99.2 fL (ref 80.0–100.0)
Monocytes Absolute: 1.5 10*3/uL — ABNORMAL HIGH (ref 0.1–1.0)
Monocytes Relative: 8 %
Neutro Abs: 17.4 10*3/uL — ABNORMAL HIGH (ref 1.7–7.7)
Neutrophils Relative %: 89 %
Platelets: 149 10*3/uL — ABNORMAL LOW (ref 150–400)
RBC: 3.91 MIL/uL (ref 3.87–5.11)
RDW: 13.9 % (ref 11.5–15.5)
WBC: 19.6 10*3/uL — ABNORMAL HIGH (ref 4.0–10.5)
nRBC: 0 % (ref 0.0–0.2)

## 2021-11-22 LAB — COMPREHENSIVE METABOLIC PANEL
ALT: 26 U/L (ref 0–44)
AST: 34 U/L (ref 15–41)
Albumin: 2.5 g/dL — ABNORMAL LOW (ref 3.5–5.0)
Alkaline Phosphatase: 85 U/L (ref 38–126)
Anion gap: 5 (ref 5–15)
BUN: 25 mg/dL — ABNORMAL HIGH (ref 8–23)
CO2: 23 mmol/L (ref 22–32)
Calcium: 7.6 mg/dL — ABNORMAL LOW (ref 8.9–10.3)
Chloride: 111 mmol/L (ref 98–111)
Creatinine, Ser: 0.82 mg/dL (ref 0.44–1.00)
GFR, Estimated: >60 mL/min (ref >60)
Glucose, Bld: 119 mg/dL — ABNORMAL HIGH (ref 70–99)
Potassium: 3.3 mmol/L — ABNORMAL LOW (ref 3.5–5.1)
Sodium: 139 mmol/L (ref 135–145)
Total Bilirubin: 0.9 mg/dL (ref 0.3–1.2)
Total Protein: 5.3 g/dL — ABNORMAL LOW (ref 6.5–8.1)

## 2021-11-22 LAB — MAGNESIUM: Magnesium: 1.9 mg/dL (ref 1.7–2.4)

## 2021-11-22 MED ORDER — HYDROMORPHONE HCL 1 MG/ML IJ SOLN: 0.5000 mg | INTRAMUSCULAR | Status: DC | PRN

## 2021-11-22 MED ORDER — SODIUM CHLORIDE 0.9 % IV SOLN: INTRAVENOUS | Status: DC

## 2021-11-22 MED ORDER — POTASSIUM CHLORIDE 10 MEQ/100ML IV SOLN
10.0000 meq | INTRAVENOUS | Status: AC
  Administered 2021-11-22 (×5): 10 meq via INTRAVENOUS
  Filled 2021-11-22 (×5): qty 100

## 2021-11-22 MED ORDER — AMIODARONE HCL IN DEXTROSE 360-4.14 MG/200ML-% IV SOLN
30.0000 mg/h | INTRAVENOUS | Status: DC
  Administered 2021-11-23 – 2021-11-24 (×5): 30 mg/h via INTRAVENOUS
  Filled 2021-11-22 (×9): qty 200

## 2021-11-22 MED ORDER — AMIODARONE HCL IN DEXTROSE 360-4.14 MG/200ML-% IV SOLN
30.0000 mg/h | INTRAVENOUS | Status: AC
  Administered 2021-11-22: 30 mg/h via INTRAVENOUS
  Filled 2021-11-22: qty 200

## 2021-11-22 MED ORDER — BOOST / RESOURCE BREEZE PO LIQD CUSTOM
1.0000 | Freq: Three times a day (TID) | ORAL | Status: DC
  Administered 2021-11-22 – 2021-11-25 (×2): 1 via ORAL

## 2021-11-22 MED ORDER — OXYCODONE HCL 5 MG PO TABS: 5.0000 mg | ORAL_TABLET | Freq: Four times a day (QID) | ORAL | Status: DC | PRN

## 2021-11-22 NOTE — Progress Notes (Signed)
Mobility Specialist - Progress Note   11/22/21 1546  Mobility  Activity Ambulated with assistance in hallway  Level of Assistance Standby assist, set-up cues, supervision of patient - no hands on  Assistive Device  (IV Pole)  Distance Ambulated (ft) 145 ft  Activity Response Tolerated well  Mobility Referral Yes  $Mobility charge 1 Mobility   Pt received in bed and agreeable to mobility. No complaints during mobility. Pt to bed after session with all needs met & family in room.   [facility]

## 2021-11-22 NOTE — Progress Notes (Signed)
Mobility Specialist - Progress Note   11/22/21 1158  Mobility  Activity Ambulated with assistance in hallway;Ambulated with assistance to bathroom  Level of Assistance Standby assist, set-up cues, supervision of patient - no hands on  Assistive Device  (IV Pole)  Distance Ambulated (ft) 120 ft  Activity Response Tolerated well  Mobility Referral Yes  $Mobility charge 1 Mobility   Pt received in bed and agreeable to mobility. Assisted pt to bathroom. No complaints during mobility. Pt to recliner after session with all needs met & family in room.     [facility]

## 2021-11-22 NOTE — Plan of Care (Signed)

## 2021-11-22 NOTE — Anesthesia Preprocedure Evaluation (Addendum)
Anesthesia Evaluation  Patient identified by MRN, date of birth, ID band Patient awake    Reviewed: Allergy & Precautions, NPO status , Patient's Chart, lab work & pertinent test results  Airway Mallampati: II  TM Distance: >3 FB Neck ROM: Full    Dental no notable dental hx. (+) Teeth Intact, Dental Advisory Given   Pulmonary    Pulmonary exam normal breath sounds clear to auscultation       Cardiovascular Normal cardiovascular exam+ dysrhythmias Atrial Fibrillation + Valvular Problems/Murmurs (MOD w hx of mvp) MR  Rhythm:Irregular Rate:Normal  10/28/2021 TEE 1. Left ventricular ejection fraction, by estimation, is 65 to 70%. The  left ventricle has normal function.   2. Right ventricular systolic function is normal. The right ventricular  size is normal.   3. Left atrial size was moderately dilated. No left atrial/left atrial  appendage thrombus was detected.   4. Right atrial size was moderately dilated.   5. The mitral valve is myxomatous with a large overriding anterior mitral  valve leaflet and a diminutive posterior leaflet that measures about 8-58m  in length. There is bileaflet prolapse with a larger posteriorly directed  jet and a smaller anteriorly  directed jet. Overall, mitral regurgitation appears moderate (EROA of  posterior jet 0.2cm2, RVol 249m. There is no systolic flow reversal in  the pulmonary veins. PASP is 2473m +RAP. . TMarland Kitchene mitral valve is  myxomatous. Moderate mitral valve regurgitation.   6. Tricuspid valve regurgitation is mild to moderate.   7. The aortic valve is tricuspid. There is mild calcification of the  aortic valve. There is mild thickening of the aortic valve. Aortic valve  regurgitation is mild. Aortic valve sclerosis/calcification is present,  without any evidence of aortic  stenosis.   8. Following TEE, the patient underwent successful DCCV with 150J x1.      Neuro/Psych negative  neurological ROS     GI/Hepatic   Endo/Other    Renal/GU      Musculoskeletal  (+) Arthritis ,    Abdominal   Peds  Hematology   Anesthesia Other Findings All: Sulfa, cipro, lidocaine, amoxicillin  Reproductive/Obstetrics                             Anesthesia Physical Anesthesia Plan  ASA: 3  Anesthesia Plan: General   Post-op Pain Management: Minimal or no pain anticipated   Induction: Intravenous  PONV Risk Score and Plan: 3 and Treatment may vary due to age or medical condition and Ondansetron  Airway Management Planned: Oral ETT  Additional Equipment: None  Intra-op Plan:   Post-operative Plan: Extubation in OR  Informed Consent: I have reviewed the patients History and Physical, chart, labs and discussed the procedure including the risks, benefits and alternatives for the proposed anesthesia with the patient or authorized representative who has indicated his/her understanding and acceptance.     Dental advisory given  Plan Discussed with: CRNA  Anesthesia Plan Comments: (ERCP for CBD stones under [place])        Anesthesia Quick Evaluation

## 2021-11-22 NOTE — Consult Note (Signed)
CARDIOLOGY CONSULT NOTE       Patient ID: FARON WHITELOCK MRN: [id number] DOB/AGE: 07-18-1927 88 y.o.  Admit date: 11/20/2021 Referring Physician: Tawanna Solo Primary Physician: Lavone Orn, MD Primary Cardiologist: Ellyn Hack Reason for Consultation: Preoperative Clearance  Principal Problem:   Acute cholecystitis Active Problems:   Paroxysmal atrial fibrillation ([place])   HLD (hyperlipidemia)   Choledocholithiasis   Sepsis ([place])   Dehydration   Acute cystitis   HPI:  88 y.o. admitted with abdominal pain and cholecystitis. WBC elevated 19.6 , Tbili 1.4 She is to have ERCP with Dr Paulita Fujita in am MRI/US with impacted stone in cystic duct she has fluid adjacent to pancreatic tail suggesting evolving pancreatitis despite negative lipase. She has normal EF with MVP and moderate  MR by TEE 10/28/21 She is not thought to be a candidate for mitral clip She has had PAF with recent [facility] by Dr Gasper Sells on 11/06/21 which is 86 days ago. She has maintained NSR Her low dose eliquis has been held for procedure Telemetry shows NSR She has no CHF, and is euvolemic with stable rhythm She is also on low dose metoprolol   ROS All other systems reviewed and negative except as noted above  Past Medical History:  Diagnosis Date   Arthritis    Diverticulitis 1995   And 2007   Hyperlipidemia LDL goal <100    Mitral valve prolapse    Bileaflet MVP with moderate MR.   PAF (paroxysmal atrial fibrillation) ([place]) 11/2017   CHA2DS2 VASc score > 3 (Age >26 and female sex), --> Eliquis & Metoprolol     Family History  Problem Relation Age of Onset   Cervical cancer Mother    CAD Father    Lung cancer Sister    Stroke Neg Hx    Diabetes Neg Hx     Social History   Socioeconomic History   Marital status: Married    Spouse name: Not on file   Number of children: Not on file   Years of education: Not on file   Highest education level: Not on file  Occupational History   Occupation: Retired  Tobacco  Use   Smoking status: Never   Smokeless tobacco: Never  Substance and Sexual Activity   Alcohol use: No   Drug use: No   Sexual activity: Not on file  Other Topics Concern   Not on file  Social History Narrative   Recently widowed November 2019--she had been the primary caregiver for her husband who had progressively worsening dementia.   Now lives alone and is planning to move into a retirement community potentially.  She handled his death well, and felt somewhat liberated having the stress of being caregiver removed.   Social Determinants of Health   Financial Resource Strain: Not on file  Food Insecurity: Not on file  Transportation Needs: Not on file  Physical Activity: Not on file  Stress: Not on file  Social Connections: Not on file  Intimate Partner Violence: Not on file    Past Surgical History:  Procedure Laterality Date   CARDIOVERSION N/A 10/28/2021   Procedure: CARDIOVERSION;  Surgeon: Freada Bergeron, MD;  Location: [place];  Service: Cardiovascular;  Laterality: N/A;   CARDIOVERSION N/A 11/06/2021   Procedure: CARDIOVERSION;  Surgeon: Werner Lean, MD;  Location: [place];  Service: Cardiovascular;  Laterality: N/A;   CATARACT EXTRACTION, BILATERAL     TEE WITHOUT CARDIOVERSION N/A 10/28/2021   Procedure: TRANSESOPHAGEAL ECHOCARDIOGRAM (TEE);  Surgeon: Freada Bergeron,  MD;  Location: [place];  Service: Cardiovascular;  Laterality: N/A;   TRANSTHORACIC ECHOCARDIOGRAM  3/'19, 9/'19   03/2017: Normal LVET (60-65%).  Gr 1 DD. Mild AI.  Mild MR with bileaflet MVP.  Mild pulm HTN; ECHO 09/27/2017: Normal LVEF w/o RWMA. Diastolic dysfxn with elevated LVEDP.  AoV sclerosis with Mod AI.  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  Severe RA dilation.     TRANSTHORACIC ECHOCARDIOGRAM  12/12/2019    EF 60 to 65%.  No R WMA.  Unable to assess diastolic pressures.  Normal RV pressures.  Mild biatrial enlargement.  Moderate  aortic calcification - no AS.  Severe BI-Leaflet MVP with Mod-Severe MR      Current Facility-Administered Medications:    0.9 %  sodium chloride infusion, , Intravenous, Continuous, Adhikari, Amrit, MD, Last Rate: 75 mL/hr at 11/22/21 0729, Rate Change at 11/22/21 0729   acetaminophen (TYLENOL) tablet 650 mg, 650 mg, Oral, Q6H PRN, 650 mg at 11/22/21 0920 **OR** acetaminophen (TYLENOL) suppository 650 mg, 650 mg, Rectal, Q6H PRN, Howerter, Justin B, DO   cefTRIAXone (ROCEPHIN) 2 g in sodium chloride 0.9 % 100 mL IVPB, 2 g, Intravenous, Q24H, Howerter, Justin B, DO, Last Rate: 200 mL/hr at 11/21/21 2127, 2 g at 11/21/21 2127   feeding supplement (BOOST / RESOURCE BREEZE) liquid 1 Container, 1 Container, Oral, TID BM, Greer Pickerel, MD, 1 Container at 11/22/21 0908   HYDROmorphone (DILAUDID) injection 0.5 mg, 0.5 mg, Intravenous, Q4H PRN, Tawanna Solo, Amrit, MD   metroNIDAZOLE (FLAGYL) IVPB 500 mg, 500 mg, Intravenous, Q12H, Howerter, Justin B, DO, Last Rate: 100 mL/hr at 11/22/21 0211, 500 mg at 11/22/21 0211   naloxone (NARCAN) injection 0.4 mg, 0.4 mg, Intravenous, PRN, Howerter, Justin B, DO   ondansetron (ZOFRAN) injection 4 mg, 4 mg, Intravenous, Q6H PRN, Howerter, Justin B, DO   oxyCODONE (Oxy IR/ROXICODONE) immediate release tablet 5 mg, 5 mg, Oral, Q6H PRN, Adhikari, Amrit, MD   potassium chloride 10 mEq in 100 mL IVPB, 10 mEq, Intravenous, Q1 Hr x 5, Adhikari, Amrit, MD, Last Rate: 100 mL/hr at 11/22/21 0923, 10 mEq at 11/22/21 0923  feeding supplement  1 Container Oral TID BM    sodium chloride 75 mL/hr at 11/22/21 0729   cefTRIAXone (ROCEPHIN)  IV 2 g (11/21/21 2127)   metronidazole 500 mg (11/22/21 0211)   potassium chloride 10 mEq (11/22/21 0923)    Physical Exam: Blood pressure (!) 118/50, pulse 84, temperature 98.3 F (36.8 C), temperature source Oral, resp. rate 18, weight 49.9 kg, SpO2 94 %.   Thin elderly female Looks great for her age MR murmur through out chest RUQ pain  to palpation no rebound no distension No edema   Labs:   Lab Results  Component Value Date   WBC 19.6 (H) 11/22/2021   HGB 12.3 11/22/2021   HCT 38.8 11/22/2021   MCV 99.2 11/22/2021   PLT 149 (L) 11/22/2021    Recent Labs  Lab 11/22/21 0413  NA 139  K 3.3*  CL 111  CO2 23  BUN 25*  CREATININE 0.82  CALCIUM 7.6*  PROT 5.3*  BILITOT 0.9  ALKPHOS 85  ALT 26  AST 34  GLUCOSE 119*   Lab Results  Component Value Date   TROPONINI 0.12 (HH) 09/27/2017    Lab Results  Component Value Date   CHOL 221 (H) 09/27/2017   Lab Results  Component Value Date   HDL 56 09/27/2017   Lab Results  Component Value Date   LDLCALC 152 (H) 09/27/2017   Lab Results  Component Value Date   TRIG 66 09/27/2017   Lab Results  Component Value Date   CHOLHDL 3.9 09/27/2017   No results found for: "LDLDIRECT"    Radiology: MR ABDOMEN MRCP W WO CONTAST  Result Date: 11/21/2021 CLINICAL DATA:  88 year old female with history of right upper quadrant abdominal pain. Evaluate for potential choledocholithiasis. EXAM: MRI ABDOMEN WITHOUT AND WITH CONTRAST (INCLUDING MRCP) TECHNIQUE: Multiplanar multisequence MR imaging of the abdomen was performed both before and after the administration of intravenous contrast. Heavily T2-weighted images of the biliary and pancreatic ducts were obtained, and three-dimensional MRCP images were rendered by post processing. CONTRAST:  35m GADAVIST GADOBUTROL 1 MMOL/ML IV SOLN COMPARISON:  No prior abdominal MRI. CT the abdomen and pelvis 11/20/2021. FINDINGS: Lower chest: Cardiomegaly. Hepatobiliary: No suspicious cystic or solid hepatic lesions. Mild intrahepatic biliary ductal dilatation. Common bile duct is dilated measuring up to 11 mm in diameter on MRCP images. Numerous filling defects are noted within the common bile duct measuring up to 9 mm in diameter, compatible with choledocholithiasis. Gallbladder is severely distended. Filling defect lying dependently  in the gallbladder, compatible with cholelithiasis. There is also a filling defect in the cystic duct (coronal image 9 of series 3) measuring 7 mm, compatible with a impacted stone. Gallbladder wall is thickened and edematous (4 mm) with trace amount of pericholecystic fluid. Pancreas: 5 mm T1 hypointense, T2 hyperintense, nonenhancing lesion in the tail of the pancreas (axial image 16 of series 4), benign in appearance. No solid-appearing pancreatic mass. No pancreatic ductal dilatation noted on MRCP images. Trace amount of T2 hyperintense fluid adjacent to the distal body and tail of the pancreas. Spleen:  Unremarkable. Adrenals/Urinary Tract: Right kidney is partially imaged, as it is in a low position in the lower abdomen. No suspicious renal lesions. Mild right hydronephrosis. No left-sided hydroureteronephrosis in the visualized portions of the abdomen. Bilateral adrenal glands are normal in appearance. Stomach/Bowel: Visualized portions are unremarkable. Vascular/Lymphatic: Aortic atherosclerosis without definite aneurysm in the visualized abdominal vasculature. No lymphadenopathy noted in the abdomen. Other:  Trace volume of perihepatic ascites. Musculoskeletal: No aggressive appearing osseous lesions are noted in the visualized portions of the skeleton. IMPRESSION: 1. Today's study is once again positive for cholelithiasis with impacted stone in the cystic duct and evidence of acute cholecystitis, as above. Surgical consultation is recommended. 2. There is also choledocholithiasis with mild intra and extrahepatic biliary ductal dilatation, as above. 3. Trace amount of fluid adjacent to the distal body and tail of the pancreas. Correlation with lipase levels is recommended to exclude the possibility of acute pancreatitis. 4. Trace volume of perihepatic ascites. 5. Low position of the right kidney, with mild right hydronephrosis. A cause of obstruction is not identified on today's examination, but based on  correlation with prior CT examination, this may reflect a UPJ obstruction. 6. Aortic atherosclerosis. Electronically Signed   By: DVinnie LangtonM.D.   On: 11/21/2021 09:49   MR 3D Recon At Scanner  Result Date: 11/21/2021 CLINICAL DATA:  88year old female with history of right upper quadrant abdominal pain. Evaluate for potential choledocholithiasis. EXAM: MRI ABDOMEN WITHOUT AND WITH CONTRAST (INCLUDING MRCP) TECHNIQUE: Multiplanar multisequence MR imaging of the abdomen was performed both before and after the administration of intravenous contrast. Heavily T2-weighted images of the biliary and pancreatic ducts were obtained, and three-dimensional MRCP images were rendered by post processing. CONTRAST:  556mGADAVIST GADOBUTROL 1  MMOL/ML IV SOLN COMPARISON:  No prior abdominal MRI. CT the abdomen and pelvis 11/20/2021. FINDINGS: Lower chest: Cardiomegaly. Hepatobiliary: No suspicious cystic or solid hepatic lesions. Mild intrahepatic biliary ductal dilatation. Common bile duct is dilated measuring up to 11 mm in diameter on MRCP images. Numerous filling defects are noted within the common bile duct measuring up to 9 mm in diameter, compatible with choledocholithiasis. Gallbladder is severely distended. Filling defect lying dependently in the gallbladder, compatible with cholelithiasis. There is also a filling defect in the cystic duct (coronal image 9 of series 3) measuring 7 mm, compatible with a impacted stone. Gallbladder wall is thickened and edematous (4 mm) with trace amount of pericholecystic fluid. Pancreas: 5 mm T1 hypointense, T2 hyperintense, nonenhancing lesion in the tail of the pancreas (axial image 16 of series 4), benign in appearance. No solid-appearing pancreatic mass. No pancreatic ductal dilatation noted on MRCP images. Trace amount of T2 hyperintense fluid adjacent to the distal body and tail of the pancreas. Spleen:  Unremarkable. Adrenals/Urinary Tract: Right kidney is partially  imaged, as it is in a low position in the lower abdomen. No suspicious renal lesions. Mild right hydronephrosis. No left-sided hydroureteronephrosis in the visualized portions of the abdomen. Bilateral adrenal glands are normal in appearance. Stomach/Bowel: Visualized portions are unremarkable. Vascular/Lymphatic: Aortic atherosclerosis without definite aneurysm in the visualized abdominal vasculature. No lymphadenopathy noted in the abdomen. Other:  Trace volume of perihepatic ascites. Musculoskeletal: No aggressive appearing osseous lesions are noted in the visualized portions of the skeleton. IMPRESSION: 1. Today's study is once again positive for cholelithiasis with impacted stone in the cystic duct and evidence of acute cholecystitis, as above. Surgical consultation is recommended. 2. There is also choledocholithiasis with mild intra and extrahepatic biliary ductal dilatation, as above. 3. Trace amount of fluid adjacent to the distal body and tail of the pancreas. Correlation with lipase levels is recommended to exclude the possibility of acute pancreatitis. 4. Trace volume of perihepatic ascites. 5. Low position of the right kidney, with mild right hydronephrosis. A cause of obstruction is not identified on today's examination, but based on correlation with prior CT examination, this may reflect a UPJ obstruction. 6. Aortic atherosclerosis. Electronically Signed   By: Vinnie Langton M.D.   On: 11/21/2021 09:49   DG Chest Port 1 View  Result Date: 11/21/2021 CLINICAL DATA:  Sepsis.  Pain and fever. EXAM: PORTABLE CHEST 1 VIEW COMPARISON:  October 24, 2021 FINDINGS: Cardiomegaly. The hila and mediastinum are unchanged. No pneumothorax. No nodules or masses. No focal infiltrates. No cause for sepsis identified. IMPRESSION: No active disease. Electronically Signed   By: Dorise Bullion III M.D.   On: 11/21/2021 09:02   CT ABDOMEN PELVIS W CONTRAST  Result Date: 11/20/2021 CLINICAL DATA:  Worsening  right lower quadrant pain for 2 days. 87 year old female. EXAM: CT ABDOMEN AND PELVIS WITH CONTRAST TECHNIQUE: Multidetector CT imaging of the abdomen and pelvis was performed using the standard protocol following bolus administration of intravenous contrast. RADIATION DOSE REDUCTION: This exam was performed according to the departmental dose-optimization program which includes automated exposure control, adjustment of the mA and/or kV according to patient size and/or use of iterative reconstruction technique. CONTRAST:  19m OMNIPAQUE IOHEXOL 300 MG/ML  SOLN COMPARISON:  CT with IV contrast 09/26/2017. FINDINGS: Lower chest: 4 mm chronic left lower lobe nodule in the posterolateral extreme base, stable. No follow-up is needed. There is mild cardiomegaly with a right chamber predominance indicating likely chronic right heart dysfunction. This was  seen previously but has slightly worsened since 2019. Calcification partially visible in the aortic valve plane and seen previously. There are heavy calcifications in the descending thoracic aorta. Hepatobiliary: The liver 16 cm in length and mildly steatotic. There is no mass enhancement. The gallbladder is dilated up to nearly 11 cm, with wall thickening and pericholecystic fluid worrisome for acute cholecystitis. There are several small stones clustered in the fundus. There is increased intrahepatic and extrahepatic biliary dilatation with common bile duct now 10 mm, and there is choledocholithiasis not seen previously. This consists of several stones in the distal common bile duct, at least 4 and possibly as many as 5 stones, largest is 9.2 by 5 mm and the smallest is 4 mm. Pancreas: Partially atrophic but unchanged. Slightly prominent pancreatic duct is also unchanged. Spleen: Normal. Adrenals/Urinary Tract: There is no adrenal mass. There are few bilateral tiny cortical hypodensities in both kidneys which are too small to characterize. Some of them were present  previously but a few of them are new. In any case no follow-up imaging is recommended. For reference seeJACR 2018 Feb; 264-273, Management of the Incidental Renal Mass on CT, [employer] 2021; 814-848, Bosniak Classification of Cystic Renal Masses, Version 2019. There is no urinary stone or obstruction.  No bladder thickening. Stomach/Bowel: The gastric wall is contracted. No small bowel dilatation or inflammation is seen. There is a normal caliber appendix. There is mild fecal stasis. There is advanced sigmoid diverticulosis, mild generalized wall thickening in the sigmoid colon which was seen previously and no findings concerning for acute inflammatory change. Vascular/Lymphatic: Moderate to heavy aortic and branch vessel atherosclerosis. Chronic left pelvic venous congestion. No enlarged lymph nodes. Reproductive: Small sized uterus versus prior supracervical hysterectomy, appearance unchanged. Again noted, there is a thin walled homogeneous uncomplicated cyst in the left adnexa likely arising from the left ovary, today measuring 4.0 x 3.4 cm, previously 2.9 x 3.6 cm, Hounsfield density of 4.3, and prior ultrasound 09/26/2017 demonstrating an anechoic uncomplicated cyst. Other: Aside from mild pericholecystic fluid, no other free fluid is seen. There is no free air, free hemorrhage, abscess or incarcerated hernia. There is a small umbilical fat hernia. Musculoskeletal: Osteopenia and degenerative change lumbar spine. No acute or other significant osseous findings. [place] left femoral head. IMPRESSION: 1. Likely acute cholecystitis, with cholelithiasis and choledocholithiasis, with intrahepatic and extrahepatic biliary dilatation. 2. Constipation and diverticulosis. No bowel obstruction or inflammation. The sigmoid colon wall is chronically thickened but no more than previously. 3. Aortic atherosclerosis.  Chronic left pelvic venous congestion. 4. Cardiomegaly with right chamber predominance, slightly  worsened since 2019. 5. 4.0 x 3.4 cm uncomplicated left ovarian cyst, previously 2.9 x 3.6 cm. Continued annual ultrasound follow-up suggested. 6. Tiny renal hypodensities too small to characterize. No follow-up recommended. 7. Small umbilical fat hernia. 8. Osteopenia and degenerative change. Aortic Atherosclerosis (ICD10-I70.0). Electronically Signed   By: Telford Nab M.D.   On: 11/20/2021 23:20   ECHO TEE  Result Date: 10/28/2021    TRANSESOPHOGEAL ECHO REPORT   Patient Name:   JANISE GORA Date of Exam: 10/28/2021 Medical Rec #:  [id number]     Height:       62.0 in Accession #:    [id number]    Weight:       108.2 lb Date of Birth:  05/04/1927      BSA:          1.472 m Patient Age:    49 years  BP:           91/61 mmHg Patient Gender: F             HR:           110 bpm. Exam Location:  Inpatient Procedure: TEE-Intraopertive, 3D Echo, Cardiac Doppler and Color Doppler Indications:     mitral regurgitation. atrial fibrillation  History:         Patient has prior history of Echocardiogram examinations, most                  recent 10/25/2021. Mitral Valve Prolapse; Risk                  Factors:Dyslipidemia.  Sonographer:     Johny Chess RDCS Referring Phys:  [id number] Greer Ee [place] Diagnosing Phys: Gwyndolyn Kaufman MD PROCEDURE: After discussion of the risks and benefits of a TEE, an informed consent was obtained from the patient. The transesophogeal probe was passed without difficulty through the esophogus of the patient. Sedation performed by different physician. The patient was monitored while under deep sedation. Anesthestetic sedation was provided intravenously by Anesthesiology: '178mg'$  of Propofol. The patient developed no complications during the procedure. A direct current cardioversion was performed. IMPRESSIONS  1. Left ventricular ejection fraction, by estimation, is 65 to 70%. The left ventricle has normal function.  2. Right ventricular systolic function is normal. The right  ventricular size is normal.  3. Left atrial size was moderately dilated. No left atrial/left atrial appendage thrombus was detected.  4. Right atrial size was moderately dilated.  5. 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 Overall, mitral regurgitation appears moderate (EROA of posterior jet 0.2cm2, RVol 257m. There is no systolic flow reversal in the pulmonary veins. PASP is 2448m +RAP. . TMarland Kitchene mitral valve is myxomatous. Moderate mitral valve regurgitation.  6. Tricuspid valve regurgitation is mild to moderate.  7. The aortic valve is tricuspid. There is mild calcification of the aortic valve. There is mild thickening of the aortic valve. Aortic valve regurgitation is mild. Aortic valve sclerosis/calcification is present, without any evidence of aortic stenosis.  8. Following TEE, the patient underwent successful DCCV with 150J x1. FINDINGS  Left Ventricle: Left ventricular ejection fraction, by estimation, is 65 to 70%. The left ventricle has normal function. The left ventricular internal cavity size was normal in size. Right Ventricle: The right ventricular size is normal. No increase in right ventricular wall thickness. Right ventricular systolic function is normal. Left Atrium: Left atrial size was moderately dilated. No left atrial/left atrial appendage thrombus was detected. Right Atrium: Right atrial size was moderately dilated. Pericardium: There is no evidence of pericardial effusion. Mitral Valve: The mitral valve is myxomatous with a large overriding anterior mitral valve leaflet and a diminutive posterior leaflet that measures about 8-9mm64m length. 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 Overall, mitral regurgitation  appears moderate (EROA of posterior jet 0.2cm2, RVol 21mL66mhere is no systolic flow reversal in the pulmonary veins. PASP is 24mmH28mAP. The mitral valve is myxomatous. Moderate mitral valve regurgitation. Tricuspid Valve: The tricuspid valve is normal in structure. Tricuspid valve regurgitation is mild to moderate. Aortic Valve: The aortic valve is tricuspid. There is mild calcification of the aortic valve.  There is mild thickening of the aortic valve. Aortic valve regurgitation is mild. Aortic valve sclerosis/calcification is present, without any evidence of aortic stenosis. Pulmonic Valve: The pulmonic valve was normal in structure. Pulmonic valve regurgitation is trivial. Aorta: The aortic root is normal in size and structure. IAS/Shunts: The atrial septum is grossly normal.  MR Peak grad:    92.5 mmHg MR Mean grad:    55.0 mmHg MR Vmax:         481.00 cm/s MR Vmean:        343.0 cm/s MR PISA:         2.26 cm MR PISA Eff ROA: 18 mm MR PISA Radius:  0.60 cm Gwyndolyn Kaufman MD Electronically signed by Gwyndolyn Kaufman MD Signature Date/Time: 10/28/2021/11:20:18 AM    Final    ECHOCARDIOGRAM COMPLETE  Result Date: 10/25/2021    ECHOCARDIOGRAM REPORT   Patient Name:   VERLON CARCIONE Date of Exam: 10/25/2021 Medical Rec #:  [id number]     Height:       62.0 in Accession #:    [id number]    Weight:       112.8 lb Date of Birth:  April 22, 1927      BSA:          1.499 m Patient Age:    49 years      BP:           121/91 mmHg Patient Gender: F             HR:           112 bpm. Exam Location:  Inpatient Procedure: 2D Echo Indications:    mitral regurgitation  History:        Patient has prior history of Echocardiogram examinations, most                 recent 12/22/2020. Mitral Valve Prolapse, Arrythmias:Atrial                 Fibrillation; Risk Factors:Dyslipidemia.  Sonographer:    Johny Chess RDCS Referring Phys: [id number] VISHNU P MALLIPEDDI IMPRESSIONS  1. Left ventricular ejection fraction, by  estimation, is 65 to 70%. The left ventricle has normal function. The left ventricle has no regional wall motion abnormalities. Left ventricular diastolic function could not be evaluated.  2. Right ventricular systolic function is normal. The right ventricular size is normal. There is moderately elevated pulmonary artery systolic pressure. The estimated right ventricular systolic pressure is 15.1 mmHg.  3. Left atrial size was moderately dilated.  4. Right atrial size was moderately dilated.  5. 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 The posterior leaflet is dimunitive and there is at least moderate posterior MR. The mitral valve is myxomatous. Moderate mitral valve regurgitation. There is severe prolapse of both leaflets of the mitral valve. Moderate mitral annular calcification.  6. Tricuspid valve regurgitation is moderate.  7. The aortic valve is tricuspid. There is moderate calcification of the aortic valve. Aortic valve regurgitation is mild to moderate. Aortic valve sclerosis/calcification is present, without any evidence of aortic stenosis. FINDINGS  Left Ventricle: Left ventricular ejection fraction, by estimation, is 65 to 70%. The left ventricle has normal function. The left ventricle has no regional wall motion abnormalities. The left ventricular internal cavity size was normal in size. There is  no left ventricular hypertrophy. Left ventricular diastolic function could not be evaluated due to atrial fibrillation. Left ventricular diastolic function could not be evaluated. Right Ventricle:  The right ventricular size is normal. No increase in right ventricular wall thickness. Right ventricular systolic function is normal. There is moderately elevated pulmonary artery systolic pressure. The tricuspid regurgitant velocity is 3.63 m/s, and with an assumed right atrial pressure of 5 mmHg, the estimated right ventricular systolic pressure is 76.7 mmHg. Left Atrium: Left atrial size was  moderately dilated. Right Atrium: Right atrial size was moderately dilated. Pericardium: There is no evidence of pericardial effusion. 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 The posterior leaflet is dimunitive and there is at least moderate posterior MR. 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 Moderate mitral annular calcification. Moderate mitral valve regurgitation, with posteriorly-directed jet. Tricuspid Valve: The tricuspid valve is normal in structure. Tricuspid valve regurgitation is moderate. Aortic Valve: The aortic valve is tricuspid. There is moderate calcification of the aortic valve. Aortic valve regurgitation is mild to moderate. Aortic valve sclerosis/calcification is present, without any evidence of aortic stenosis. Pulmonic Valve: The pulmonic valve was normal in structure. Pulmonic valve regurgitation is trivial. Aorta: The aortic root and ascending aorta are structurally normal, with no evidence of dilitation. IAS/Shunts: No atrial level shunt detected by color flow Doppler.  LEFT VENTRICLE PLAX 2D LVIDd:         3.80 cm LVIDs:         2.10 cm LV PW:         0.80 cm LV IVS:        0.90 cm LVOT diam:     2.00 cm LVOT Area:     3.14 cm  RIGHT VENTRICLE         IVC TAPSE (M-mode): 1.5 cm  IVC diam: 2.00 cm LEFT ATRIUM             Index        RIGHT ATRIUM           Index LA diam:        3.80 cm 2.54 cm/m   RA Area:     17.50 cm LA Vol (A2C):   79.6 ml 53.11 ml/m  RA Volume:   48.90 ml  32.63 ml/m LA Vol (A4C):   79.1 ml 52.78 ml/m LA Biplane Vol: 82.2 ml 54.85 ml/m   AORTA Ao Root diam: 2.90 cm Ao Asc diam:  3.10 cm TRICUSPID VALVE TR Peak grad:   52.7 mmHg TR Vmax:        363.00 cm/s  SHUNTS Systemic Diam: 2.00 cm Glori Bickers MD Electronically signed by Glori Bickers MD Signature Date/Time: 10/25/2021/5:56:23 PM    Final    DG Chest 2 View  Result Date: 10/24/2021 CLINICAL DATA:  Shortness of  breath. EXAM: CHEST - 2 VIEW COMPARISON:  09/26/2017 chest radiograph FINDINGS: Cardiomegaly noted. Mild bibasilar opacities/atelectasis noted as well as a trace RIGHT pleural effusion. There is no evidence of pneumothorax. No acute bony abnormalities are present. IMPRESSION: 1. Mild bibasilar opacities/atelectasis and trace RIGHT pleural effusion. Pneumonia is not excluded. 2. Cardiomegaly. Electronically Signed   By: Margarette Canada M.D.   On: 10/24/2021 11:45    EKG: SR PR 248 msec PVC    ASSESSMENT AND PLAN:   Preoperative:  ok to proceed with ERCP in am to prevent pancreatitis and sepsis in setting of impacted cystic duct stone PAF: Not ideal that anticoagulation held prior to full 3 weeks post [facility] but she is in NSR and has had 16 days Rx.  Will continue lopressor and change amiodarone to iv to try to prevent recurrence of PAF with cholecystitis and during ERCP MR:  moderate ? Moderate to severe from prolapse. Euvolemic not CHF turned down for mitral clip procedure  Her neighbor look in on her and discussed with her She has one son but he lives in [place]   Signed: Jenkins Rouge 11/22/2021, 10:15 AM

## 2021-11-22 NOTE — Progress Notes (Signed)
Subjective:  Pain better.  Objective: Vital signs in last 24 hours: Temp:  [98.3 F (36.8 C)-98.7 F (37.1 C)] 98.3 F (36.8 C) (11/19 0916) Pulse Rate:  [77-84] 84 (11/19 1302) Resp:  [18-25] 18 (11/19 0440) BP: (109-121)/(47-51) 117/48 (11/19 1302) SpO2:  [93 %-95 %] 95 % (11/19 1302) Weight:  [49.9 kg] 49.9 kg (11/19 0500) Weight change:  Last BM Date : 11/20/21  PE: GEN:  NAD, younger-appearing stated age, sitting comfortably in bedside chair. HEENT:  Anicteric NEURO:  A/O, no encephalopathy  Lab Results: CBC    Component Value Date/Time   WBC 19.6 (H) 11/22/2021 0413   RBC 3.91 11/22/2021 0413   HGB 12.3 11/22/2021 0413   HCT 38.8 11/22/2021 0413   PLT 149 (L) 11/22/2021 0413   MCV 99.2 11/22/2021 0413   MCH 31.5 11/22/2021 0413   MCHC 31.7 11/22/2021 0413   RDW 13.9 11/22/2021 0413   LYMPHSABS 0.4 (L) 11/22/2021 0413   MONOABS 1.5 (H) 11/22/2021 0413   EOSABS 0.0 11/22/2021 0413   BASOSABS 0.0 11/22/2021 0413  CMP     Component Value Date/Time   NA 139 11/22/2021 0413   K 3.3 (L) 11/22/2021 0413   CL 111 11/22/2021 0413   CO2 23 11/22/2021 0413   GLUCOSE 119 (H) 11/22/2021 0413   BUN 25 (H) 11/22/2021 0413   CREATININE 0.82 11/22/2021 0413   CALCIUM 7.6 (L) 11/22/2021 0413   PROT 5.3 (L) 11/22/2021 0413   ALBUMIN 2.5 (L) 11/22/2021 0413   AST 34 11/22/2021 0413   ALT 26 11/22/2021 0413   ALKPHOS 85 11/22/2021 0413   BILITOT 0.9 11/22/2021 0413   GFRNONAA >60 11/22/2021 0413   GFRAA 57 (L) 09/28/2017 0413   Assessment:  Acute cholecystitis.  This is principal source of patient's pain and leukocytosis and fevers. CBD stones.  LFTs nearly normal. Chronic anticoagulation, Atrial fibrillation, apixaban, last dose yesterday.  Plan:   Continue antibiotics. Follow LFTs. Eliquis on hold. Full liquid diet ok. NPO after midnight. ERCP tomorrow, pending bed availability. Risks (up to and including bleeding, infection, perforation, pancreatitis that  can be complicated by infected necrosis and death), benefits (removal of stones, alleviating blockage, decreasing risk of cholangitis or choledocholithiasis-related pancreatitis), and alternatives (watchful waiting, percutaneous transhepatic cholangiography) of ERCP were explained to patient/family in detail and patient elects to proceed.    Landry Dyke 11/22/2021, 1:09 PM   Cell [phone] If no answer or after 5 PM call [phone]

## 2021-11-22 NOTE — H&P (View-Only) (Signed)
Subjective:  Pain better.  Objective: Vital signs in last 24 hours: Temp:  [98.3 F (36.8 C)-98.7 F (37.1 C)] 98.3 F (36.8 C) (11/19 0916) Pulse Rate:  [77-84] 84 (11/19 1302) Resp:  [18-25] 18 (11/19 0440) BP: (109-121)/(47-51) 117/48 (11/19 1302) SpO2:  [93 %-95 %] 95 % (11/19 1302) Weight:  [49.9 kg] 49.9 kg (11/19 0500) Weight change:  Last BM Date : 11/20/21  PE: GEN:  NAD, younger-appearing stated age, sitting comfortably in bedside chair. HEENT:  Anicteric NEURO:  A/O, no encephalopathy  Lab Results: CBC    Component Value Date/Time   WBC 19.6 (H) 11/22/2021 0413   RBC 3.91 11/22/2021 0413   HGB 12.3 11/22/2021 0413   HCT 38.8 11/22/2021 0413   PLT 149 (L) 11/22/2021 0413   MCV 99.2 11/22/2021 0413   MCH 31.5 11/22/2021 0413   MCHC 31.7 11/22/2021 0413   RDW 13.9 11/22/2021 0413   LYMPHSABS 0.4 (L) 11/22/2021 0413   MONOABS 1.5 (H) 11/22/2021 0413   EOSABS 0.0 11/22/2021 0413   BASOSABS 0.0 11/22/2021 0413  CMP     Component Value Date/Time   NA 139 11/22/2021 0413   K 3.3 (L) 11/22/2021 0413   CL 111 11/22/2021 0413   CO2 23 11/22/2021 0413   GLUCOSE 119 (H) 11/22/2021 0413   BUN 25 (H) 11/22/2021 0413   CREATININE 0.82 11/22/2021 0413   CALCIUM 7.6 (L) 11/22/2021 0413   PROT 5.3 (L) 11/22/2021 0413   ALBUMIN 2.5 (L) 11/22/2021 0413   AST 34 11/22/2021 0413   ALT 26 11/22/2021 0413   ALKPHOS 85 11/22/2021 0413   BILITOT 0.9 11/22/2021 0413   GFRNONAA >60 11/22/2021 0413   GFRAA 57 (L) 09/28/2017 0413   Assessment:  Acute cholecystitis.  This is principal source of patient's pain and leukocytosis and fevers. CBD stones.  LFTs nearly normal. Chronic anticoagulation, Atrial fibrillation, apixaban, last dose yesterday.  Plan:   Continue antibiotics. Follow LFTs. Eliquis on hold. Full liquid diet ok. NPO after midnight. ERCP tomorrow, pending bed availability. Risks (up to and including bleeding, infection, perforation, pancreatitis that  can be complicated by infected necrosis and death), benefits (removal of stones, alleviating blockage, decreasing risk of cholangitis or choledocholithiasis-related pancreatitis), and alternatives (watchful waiting, percutaneous transhepatic cholangiography) of ERCP were explained to patient/family in detail and patient elects to proceed.    Landry Dyke 11/22/2021, 1:09 PM   Cell [phone] If no answer or after 5 PM call [phone]

## 2021-11-22 NOTE — Progress Notes (Signed)
PROGRESS NOTE  Lisa Lang  ONG:[id number] DOB: 01/11/1927 DOA: 11/20/2021 PCP: Lavone Orn, MD   Brief Narrative:  Patient is a 88 year old female with history of paroxysmal A-fib on Eliquis, hyperlipidemia who presented with right upper quadrant abdominal discomfort, nausea, vomiting, subjective fevers at home.  On presentation, she was hemodynamically stable.  Lab work showed creatinine of 1.06, bilirubin of 1.7, normal liver enzymes, lipase of 25, WBC count of 21.3.  CT abdomen/pelvis showed dilated gallbladder up to 11 cm with associated gallbladder thickening, pericholecystic fluid consistent with acute cholecystitis, also showed stone clusters in the gallbladder fundus as well as evidence of intrahepatic/extrahepatic biliary dilatation with the common bile duct of 10 mm in addition to showing evidence of radiopaque choledocholithiasis.  MRCP confirmed stone in the cystic duct, choledocholithiasis.  Patient has been started on antibiotics.  GI, general surgery consulted.  Plan for ERCP tomorrow ,followed by cholecystectomy  Assessment & Plan:  Principal Problem:   Acute cholecystitis Active Problems:   Paroxysmal atrial fibrillation (HCC)   HLD (hyperlipidemia)   Choledocholithiasis   Sepsis ([facility])   Dehydration   Acute cystitis   Acute cholecystitis: Presented with abdominal pain, subjective fever, nausea, vomiting.  Imaging as above.  Currently on ceftriaxone, Flagyl.  Follow-up cultures. General surgery and GI planning following.  Plan for ERCP tomorrow followed by cholecystectomy .  Continue gentle IV fluids. Leukocytosis gently  improving  Sepsis: Met sepsis criteria on admission with leukocytosis, tachypnea.  Currently hemodynamically stable.  Cultures have been sent, so far negative  Paroxysmal A-fib: Currently in normal sinus rhythm.  On Eliquis for anticoagulation, which is on hold.  On metoprolol for rate control at home  Hyperlipidemia: On Lipitor at  home  Hypokalemia: Supplemented        DVT prophylaxis:SCDs Start: 11/21/21 0027     Code Status: Full Code  Family Communication: Daughter at bedside  Patient status:Inpatient  Patient is from :Home  Anticipated discharge GM:[facility]  Estimated DC date:2-3 days   Consultants: GI,Surgery  Procedures:MRCP  Antimicrobials:  Anti-infectives (From admission, onward)    Start     Dose/Rate Route Frequency Ordered Stop   11/21/21 2200  cefTRIAXone (ROCEPHIN) 2 g in sodium chloride 0.9 % 100 mL IVPB        2 g 200 mL/hr over 30 Minutes Intravenous Every 24 hours 11/21/21 0029     11/21/21 0030  metroNIDAZOLE (FLAGYL) IVPB 500 mg        500 mg 100 mL/hr over 60 Minutes Intravenous Every 12 hours 11/21/21 0029     11/20/21 2230  cefTRIAXone (ROCEPHIN) 1 g in sodium chloride 0.9 % 100 mL IVPB        1 g 200 mL/hr over 30 Minutes Intravenous  Once 11/20/21 2218 11/20/21 2331       Subjective: Patient seen and examined at the bedside today.  Hemodynamically stable.  Feels better today, slept well but still has abdominal discomfort.  No nausea or vomiting  Objective: Vitals:   11/21/21 1500 11/21/21 2029 11/22/21 0440 11/22/21 0500  BP:  (!) 121/51 (!) 109/47   Pulse:  83 77   Resp: (!) _0 Temp:  98.6 F (37 C) 98.7 F (37.1 C)   TempSrc:  Oral Oral   SpO2:  95% 93%   Weight:    49.9 kg    Intake/Output Summary (Last 24 hours) at 11/22/2021 0907 Last data filed at 11/21/2021 1554 Gross per 24 hour  Intake 847.21 ml  Output --  Net 847.21 ml   Filed Weights   11/22/21 0500  Weight: 49.9 kg    Examination:  General exam: Overall comfortable, not in distress, pleasant elderly female HEENT: PERRL Respiratory system:  no wheezes or crackles  Cardiovascular system: S1 & S2 heard, RRR.  Gastrointestinal system: Abdomen is mildly distended, tender in the right upper quadrant Central nervous system: Alert and oriented Extremities: No edema, no clubbing  ,no cyanosis Skin: No rashes, no ulcers,no icterus     Data Reviewed: I have personally reviewed following labs and imaging studies  CBC: Recent Labs  Lab 11/20/21 2054 11/21/21 0434 11/22/21 0413  WBC 21.3* 21.4* 19.6*  NEUTROABS 17.4* 18.9* 17.4*  HGB 14.8 13.1 12.3  HCT 46.1* 41.4 38.8  MCV 97.1 97.9 99.2  PLT 195 182 481*   Basic Metabolic Panel: Recent Labs  Lab 11/20/21 2054 11/21/21 0434 11/22/21 0413  NA 137 140 139  K 4.0 3.8 3.3*  CL 100 106 111  CO2 _0 GLUCOSE 137* 125* 119*  BUN 27* 26* 25*  CREATININE 1.06* 0.95 0.82  CALCIUM 9.3 8.5* 7.6*  MG  --  1.9 1.9     No results found for this or any previous visit (from the past 240 hour(s)).   Radiology Studies: MR ABDOMEN MRCP W WO CONTAST  Result Date: 11/21/2021 CLINICAL DATA:  88 year old female with history of right upper quadrant abdominal pain. Evaluate for potential choledocholithiasis. EXAM: MRI ABDOMEN WITHOUT AND WITH CONTRAST (INCLUDING MRCP) TECHNIQUE: Multiplanar multisequence MR imaging of the abdomen was performed both before and after the administration of intravenous contrast. Heavily T2-weighted images of the biliary and pancreatic ducts were obtained, and three-dimensional MRCP images were rendered by post processing. CONTRAST:  36m GADAVIST GADOBUTROL 1 MMOL/ML IV SOLN COMPARISON:  No prior abdominal MRI. CT the abdomen and pelvis 11/20/2021. FINDINGS: Lower chest: Cardiomegaly. Hepatobiliary: No suspicious cystic or solid hepatic lesions. Mild intrahepatic biliary ductal dilatation. Common bile duct is dilated measuring up to 11 mm in diameter on MRCP images. Numerous filling defects are noted within the common bile duct measuring up to 9 mm in diameter, compatible with choledocholithiasis. Gallbladder is severely distended. Filling defect lying dependently in the gallbladder, compatible with cholelithiasis. There is also a filling defect in the cystic duct (coronal image 9 of series 3)  measuring 7 mm, compatible with a impacted stone. Gallbladder wall is thickened and edematous (4 mm) with trace amount of pericholecystic fluid. Pancreas: 5 mm T1 hypointense, T2 hyperintense, nonenhancing lesion in the tail of the pancreas (axial image 16 of series 4), benign in appearance. No solid-appearing pancreatic mass. No pancreatic ductal dilatation noted on MRCP images. Trace amount of T2 hyperintense fluid adjacent to the distal body and tail of the pancreas. Spleen:  Unremarkable. Adrenals/Urinary Tract: Right kidney is partially imaged, as it is in a low position in the lower abdomen. No suspicious renal lesions. Mild right hydronephrosis. No left-sided hydroureteronephrosis in the visualized portions of the abdomen. Bilateral adrenal glands are normal in appearance. Stomach/Bowel: Visualized portions are unremarkable. Vascular/Lymphatic: Aortic atherosclerosis without definite aneurysm in the visualized abdominal vasculature. No lymphadenopathy noted in the abdomen. Other:  Trace volume of perihepatic ascites. Musculoskeletal: No aggressive appearing osseous lesions are noted in the visualized portions of the skeleton. IMPRESSION: 1. Today's study is once again positive for cholelithiasis with impacted stone in the cystic duct and evidence of acute cholecystitis, as above. Surgical consultation is recommended. 2. There is also choledocholithiasis with  mild intra and extrahepatic biliary ductal dilatation, as above. 3. Trace amount of fluid adjacent to the distal body and tail of the pancreas. Correlation with lipase levels is recommended to exclude the possibility of acute pancreatitis. 4. Trace volume of perihepatic ascites. 5. Low position of the right kidney, with mild right hydronephrosis. A cause of obstruction is not identified on today's examination, but based on correlation with prior CT examination, this may reflect a UPJ obstruction. 6. Aortic atherosclerosis. Electronically Signed   By:  Vinnie Langton M.D.   On: 11/21/2021 09:49   MR 3D Recon At Scanner  Result Date: 11/21/2021 CLINICAL DATA:  88 year old female with history of right upper quadrant abdominal pain. Evaluate for potential choledocholithiasis. EXAM: MRI ABDOMEN WITHOUT AND WITH CONTRAST (INCLUDING MRCP) TECHNIQUE: Multiplanar multisequence MR imaging of the abdomen was performed both before and after the administration of intravenous contrast. Heavily T2-weighted images of the biliary and pancreatic ducts were obtained, and three-dimensional MRCP images were rendered by post processing. CONTRAST:  27m GADAVIST GADOBUTROL 1 MMOL/ML IV SOLN COMPARISON:  No prior abdominal MRI. CT the abdomen and pelvis 11/20/2021. FINDINGS: Lower chest: Cardiomegaly. Hepatobiliary: No suspicious cystic or solid hepatic lesions. Mild intrahepatic biliary ductal dilatation. Common bile duct is dilated measuring up to 11 mm in diameter on MRCP images. Numerous filling defects are noted within the common bile duct measuring up to 9 mm in diameter, compatible with choledocholithiasis. Gallbladder is severely distended. Filling defect lying dependently in the gallbladder, compatible with cholelithiasis. There is also a filling defect in the cystic duct (coronal image 9 of series 3) measuring 7 mm, compatible with a impacted stone. Gallbladder wall is thickened and edematous (4 mm) with trace amount of pericholecystic fluid. Pancreas: 5 mm T1 hypointense, T2 hyperintense, nonenhancing lesion in the tail of the pancreas (axial image 16 of series 4), benign in appearance. No solid-appearing pancreatic mass. No pancreatic ductal dilatation noted on MRCP images. Trace amount of T2 hyperintense fluid adjacent to the distal body and tail of the pancreas. Spleen:  Unremarkable. Adrenals/Urinary Tract: Right kidney is partially imaged, as it is in a low position in the lower abdomen. No suspicious renal lesions. Mild right hydronephrosis. No left-sided  hydroureteronephrosis in the visualized portions of the abdomen. Bilateral adrenal glands are normal in appearance. Stomach/Bowel: Visualized portions are unremarkable. Vascular/Lymphatic: Aortic atherosclerosis without definite aneurysm in the visualized abdominal vasculature. No lymphadenopathy noted in the abdomen. Other:  Trace volume of perihepatic ascites. Musculoskeletal: No aggressive appearing osseous lesions are noted in the visualized portions of the skeleton. IMPRESSION: 1. Today's study is once again positive for cholelithiasis with impacted stone in the cystic duct and evidence of acute cholecystitis, as above. Surgical consultation is recommended. 2. There is also choledocholithiasis with mild intra and extrahepatic biliary ductal dilatation, as above. 3. Trace amount of fluid adjacent to the distal body and tail of the pancreas. Correlation with lipase levels is recommended to exclude the possibility of acute pancreatitis. 4. Trace volume of perihepatic ascites. 5. Low position of the right kidney, with mild right hydronephrosis. A cause of obstruction is not identified on today's examination, but based on correlation with prior CT examination, this may reflect a UPJ obstruction. 6. Aortic atherosclerosis. Electronically Signed   By: DVinnie LangtonM.D.   On: 11/21/2021 09:49   DG Chest Port 1 View  Result Date: 11/21/2021 CLINICAL DATA:  Sepsis.  Pain and fever. EXAM: PORTABLE CHEST 1 VIEW COMPARISON:  October 24, 2021 FINDINGS: Cardiomegaly. The  hila and mediastinum are unchanged. No pneumothorax. No nodules or masses. No focal infiltrates. No cause for sepsis identified. IMPRESSION: No active disease. Electronically Signed   By: Dorise Bullion III M.D.   On: 11/21/2021 09:02   CT ABDOMEN PELVIS W CONTRAST  Result Date: 11/20/2021 CLINICAL DATA:  Worsening right lower quadrant pain for 2 days. 88 year old female. EXAM: CT ABDOMEN AND PELVIS WITH CONTRAST TECHNIQUE: Multidetector CT  imaging of the abdomen and pelvis was performed using the standard protocol following bolus administration of intravenous contrast. RADIATION DOSE REDUCTION: This exam was performed according to the departmental dose-optimization program which includes automated exposure control, adjustment of the mA and/or kV according to patient size and/or use of iterative reconstruction technique. CONTRAST:  162m OMNIPAQUE IOHEXOL 300 MG/ML  SOLN COMPARISON:  CT with IV contrast 09/26/2017. FINDINGS: Lower chest: 4 mm chronic left lower lobe nodule in the posterolateral extreme base, stable. No follow-up is needed. There is mild cardiomegaly with a right chamber predominance indicating likely chronic right heart dysfunction. This was seen previously but has slightly worsened since 2019. Calcification partially visible in the aortic valve plane and seen previously. There are heavy calcifications in the descending thoracic aorta. Hepatobiliary: The liver 16 cm in length and mildly steatotic. There is no mass enhancement. The gallbladder is dilated up to nearly 11 cm, with wall thickening and pericholecystic fluid worrisome for acute cholecystitis. There are several small stones clustered in the fundus. There is increased intrahepatic and extrahepatic biliary dilatation with common bile duct now 10 mm, and there is choledocholithiasis not seen previously. This consists of several stones in the distal common bile duct, at least 4 and possibly as many as 5 stones, largest is 9.2 by 5 mm and the smallest is 4 mm. Pancreas: Partially atrophic but unchanged. Slightly prominent pancreatic duct is also unchanged. Spleen: Normal. Adrenals/Urinary Tract: There is no adrenal mass. There are few bilateral tiny cortical hypodensities in both kidneys which are too small to characterize. Some of them were present previously but a few of them are new. In any case no follow-up imaging is recommended. For reference seeJACR 2018 Feb; 264-273,  Management of the Incidental Renal Mass on CT, [employer] 2021; [id number], Bosniak Classification of Cystic Renal Masses, Version 2019. There is no urinary stone or obstruction.  No bladder thickening. Stomach/Bowel: The gastric wall is contracted. No small bowel dilatation or inflammation is seen. There is a normal caliber appendix. There is mild fecal stasis. There is advanced sigmoid diverticulosis, mild generalized wall thickening in the sigmoid colon which was seen previously and no findings concerning for acute inflammatory change. Vascular/Lymphatic: Moderate to heavy aortic and branch vessel atherosclerosis. Chronic left pelvic venous congestion. No enlarged lymph nodes. Reproductive: Small sized uterus versus prior supracervical hysterectomy, appearance unchanged. Again noted, there is a thin walled homogeneous uncomplicated cyst in the left adnexa likely arising from the left ovary, today measuring 4.0 x 3.4 cm, previously 2.9 x 3.6 cm, Hounsfield density of 4.3, and prior ultrasound 09/26/2017 demonstrating an anechoic uncomplicated cyst. Other: Aside from mild pericholecystic fluid, no other free fluid is seen. There is no free air, free hemorrhage, abscess or incarcerated hernia. There is a small umbilical fat hernia. Musculoskeletal: Osteopenia and degenerative change lumbar spine. No acute or other significant osseous findings. [place] left femoral head. IMPRESSION: 1. Likely acute cholecystitis, with cholelithiasis and choledocholithiasis, with intrahepatic and extrahepatic biliary dilatation. 2. Constipation and diverticulosis. No bowel obstruction or inflammation. The sigmoid colon wall is  chronically thickened but no more than previously. 3. Aortic atherosclerosis.  Chronic left pelvic venous congestion. 4. Cardiomegaly with right chamber predominance, slightly worsened since 2019. 5. 4.0 x 3.4 cm uncomplicated left ovarian cyst, previously 2.9 x 3.6 cm. Continued annual ultrasound  follow-up suggested. 6. Tiny renal hypodensities too small to characterize. No follow-up recommended. 7. Small umbilical fat hernia. 8. Osteopenia and degenerative change. Aortic Atherosclerosis (ICD10-I70.0). Electronically Signed   By: Telford Nab M.D.   On: 11/20/2021 23:20    Scheduled Meds:  feeding supplement  1 Container Oral TID BM   Continuous Infusions:  sodium chloride 75 mL/hr at 11/22/21 0729   cefTRIAXone (ROCEPHIN)  IV 2 g (11/21/21 2127)   metronidazole 500 mg (11/22/21 0211)   potassium chloride 10 mEq (11/22/21 0806)     LOS: 1 day   Shelly Coss, MD [facility] P11/19/2023, 9:07 AM

## 2021-11-22 NOTE — Consult Note (Signed)
CC: abdominal pain  Requesting provider: Dr Sloan Leiter  HPI: Lisa Lang is an 88 y.o. female who is here for evaluation because of worsening right-sided pain.  She states her pain started on Thursday evening and persisted overnight prompting her to come to the emergency room on the 17th.  She initially thought she had appendicitis.  But her pain was more localized in the right upper abdomen.  She denies any prior symptoms.  She had subjective chills but no nausea or vomiting.  No diarrhea or constipation.  She took her Eliquis on the 17th.  The patient has a history of paroxysmal atrial fibrillation.  She had a recent hospitalization at the end of October for rapid A-fib requiring cardioversion.  She also underwent cardioversion again on November 3.  She states that she was having shortness of breath prior to her cardioversion but since then her shortness of breath on exertion has resolved.  She lives alone.  Her neighbors help look out for her.  Her son lives at [place].  She still drives.  She denies any chest pain or chest pressure.  She denies any TIAs or amaurosis fugax.  In the ER she was found to have cholecystitis as well as imaging findings concerning for choledocholithiasis.  She underwent MRCP which confirmed choledocholithiasis.  I attempted to see her yesterday but she was an MRI when I rounded on Saturday.  I had a long conversation with her neighbor at the bedside.  Past Medical History:  Diagnosis Date   Arthritis    Diverticulitis 1995   And 2007   Hyperlipidemia LDL goal <100    Mitral valve prolapse    Bileaflet MVP with moderate MR.   PAF (paroxysmal atrial fibrillation) ([place]) 11/2017   CHA2DS2 VASc score > 3 (Age >97 and female sex), --> Eliquis & Metoprolol     Past Surgical History:  Procedure Laterality Date   CARDIOVERSION N/A 10/28/2021   Procedure: CARDIOVERSION;  Surgeon: Freada Bergeron, MD;  Location: [facility] ENDOSCOPY;  Service: Cardiovascular;  Laterality:  N/A;   CARDIOVERSION N/A 11/06/2021   Procedure: CARDIOVERSION;  Surgeon: Werner Lean, MD;  Location: [facility] ENDOSCOPY;  Service: Cardiovascular;  Laterality: N/A;   CATARACT EXTRACTION, BILATERAL     TEE WITHOUT CARDIOVERSION N/A 10/28/2021   Procedure: TRANSESOPHAGEAL ECHOCARDIOGRAM (TEE);  Surgeon: Freada Bergeron, MD;  Location: [facility] ENDOSCOPY;  Service: Cardiovascular;  Laterality: N/A;   TRANSTHORACIC ECHOCARDIOGRAM  3/'19, 9/'19   03/2017: Normal LVET (60-65%).  Gr 1 DD. Mild AI.  Mild MR with bileaflet MVP.  Mild pulm HTN; ECHO 09/27/2017: Normal LVEF w/o RWMA. Diastolic dysfxn with elevated LVEDP.  AoV sclerosis with Mod AI.  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  Severe RA dilation.     TRANSTHORACIC ECHOCARDIOGRAM  12/12/2019    EF 60 to 65%.  No R WMA.  Unable to assess diastolic pressures.  Normal RV pressures.  Mild biatrial enlargement.  Moderate aortic calcification - no AS.  Severe BI-Leaflet MVP with Mod-Severe MR    Family History  Problem Relation Age of Onset   Cervical cancer Mother    CAD Father    Lung cancer Sister    Stroke Neg Hx    Diabetes Neg Hx     Social:  reports that she has never smoked. She has never used smokeless tobacco. She reports that she does not drink alcohol and does not use drugs.  Allergies:  Allergies  Allergen Reactions  Ciprofloxacin Diarrhea   Lidocaine     unknown   Sulfa Antibiotics     unknown   Amoxicillin Rash    Medications: I have reviewed the patient's current medications.   ROS - all of the below systems have been reviewed with the patient and positives are indicated with bold text General: chills, fever or night sweats Eyes: blurry vision or double vision ENT: epistaxis or sore throat Allergy/Immunology: itchy/watery eyes or nasal congestion Hematologic/Lymphatic: bleeding problems, blood clots or swollen lymph nodes Endocrine: temperature intolerance or unexpected weight  changes Breast: new or changing breast lumps or nipple discharge Resp: cough, shortness of breath, or wheezing CV: chest pain or dyspnea on exertion; see hpi GI: as per HPI GU: dysuria, trouble voiding, or hematuria MSK: joint pain or joint stiffness Neuro: TIA or stroke symptoms Derm: pruritus and skin lesion changes Psych: anxiety and depression  PE Blood pressure (!) 109/47, pulse 77, temperature 98.7 F (37.1 C), temperature source Oral, resp. rate 18, weight 49.9 kg, SpO2 93 %. Constitutional: NAD; conversant; no deformities Eyes: Moist conjunctiva; no lid lag; anicteric; PERRL Neck: Trachea midline; no thyromegaly Lungs: Normal respiratory effort; no tactile fremitus CV: RRR; no palpable thrills; no pitting edema GI: Abd soft, TTP RUQ; no palpable hepatosplenomegaly MSK: Normal gait; no clubbing/cyanosis Psychiatric: Appropriate affect; alert and oriented x3 Lymphatic: No palpable cervical or axillary lymphadenopathy Skin:no rash/lesions/jaundice  Results for orders placed or performed during the hospital encounter of 11/20/21 (from the past 48 hour(s))  Comprehensive metabolic panel     Status: Abnormal   Collection Time: 11/20/21  8:54 PM  Result Value Ref Range   Sodium 137 135 - 145 mmol/L   Potassium 4.0 3.5 - 5.1 mmol/L   Chloride 100 98 - 111 mmol/L   CO2 27 22 - 32 mmol/L   Glucose, Bld 137 (H) 70 - 99 mg/dL    Comment: Glucose reference range applies only to samples taken after fasting for at least 8 hours.   BUN 27 (H) 8 - 23 mg/dL   Creatinine, Ser 1.06 (H) 0.44 - 1.00 mg/dL   Calcium 9.3 8.9 - 10.3 mg/dL   Total Protein 7.0 6.5 - 8.1 g/dL   Albumin 3.7 3.5 - 5.0 g/dL   AST 37 15 - 41 U/L   ALT 26 0 - 44 U/L   Alkaline Phosphatase 105 38 - 126 U/L   Total Bilirubin 1.7 (H) 0.3 - 1.2 mg/dL   GFR, Estimated 49 (L) >60 mL/min    Comment: (NOTE) Calculated using the CKD-EPI Creatinine Equation (2021)    Anion gap 10 5 - 15    Comment: Performed at [facility], [place] [address], [place], [place] [address]  Lipase, blood     Status: None   Collection Time: 11/20/21  8:54 PM  Result Value Ref Range   Lipase 25 11 - 51 U/L    Comment: Performed at [facility], [place] [address], [place], [place] [address]  CBC with Diff     Status: Abnormal   Collection Time: 11/20/21  8:54 PM  Result Value Ref Range   WBC 21.3 (H) 4.0 - 10.5 K/uL   RBC 4.75 3.87 - 5.11 MIL/uL   Hemoglobin 14.8 12.0 - 15.0 g/dL   HCT 46.1 (H) 36.0 - 46.0 %   MCV 97.1 80.0 - 100.0 fL   MCH 31.2 26.0 - 34.0 pg   MCHC 32.1 30.0 - 36.0 g/dL   RDW 13.4 11.5 -  15.5 %   Platelets 195 150 - 400 K/uL   nRBC 0.0 0.0 - 0.2 %   Neutrophils Relative % 81 %   Neutro Abs 17.4 (H) 1.7 - 7.7 K/uL   Lymphocytes Relative 2 %   Lymphs Abs 0.5 (L) 0.7 - 4.0 K/uL   Monocytes Relative 9 %   Monocytes Absolute 1.8 (H) 0.1 - 1.0 K/uL   Eosinophils Relative 7 %   Eosinophils Absolute 1.5 (H) 0.0 - 0.5 K/uL   Basophils Relative 0 %   Basophils Absolute 0.0 0.0 - 0.1 K/uL   Immature Granulocytes 1 %   Abs Immature Granulocytes 0.10 (H) 0.00 - 0.07 K/uL    Comment: Performed at [facility], [place] [address], [place], [place] [address]  Urinalysis, Routine w reflex microscopic Urine, Clean Catch     Status: Abnormal   Collection Time: 11/20/21  8:54 PM  Result Value Ref Range   Color, Urine AMBER (A) YELLOW    Comment: BIOCHEMICALS MAY BE AFFECTED BY COLOR   APPearance CLEAR CLEAR   Specific Gravity, Urine 1.024 1.005 - 1.030   pH 5.0 5.0 - 8.0   Glucose, UA NEGATIVE NEGATIVE mg/dL   Hgb urine dipstick NEGATIVE NEGATIVE   Bilirubin Urine NEGATIVE NEGATIVE   Ketones, ur NEGATIVE NEGATIVE mg/dL   Protein, ur 100 (A) NEGATIVE mg/dL   Nitrite NEGATIVE NEGATIVE   Leukocytes,Ua MODERATE (A) NEGATIVE   WBC, UA 21-50 0 - 5 WBC/hpf   Bacteria, UA RARE (A) NONE SEEN   Squamous Epithelial / LPF 0-5 0 - 5   Mucus PRESENT     Comment:  Performed at [facility], [place] [address], [place], [place] [address]  CBC with Differential/Platelet     Status: Abnormal   Collection Time: 11/21/21  4:34 AM  Result Value Ref Range   WBC 21.4 (H) 4.0 - 10.5 K/uL   RBC 4.23 3.87 - 5.11 MIL/uL   Hemoglobin 13.1 12.0 - 15.0 g/dL   HCT 41.4 36.0 - 46.0 %   MCV 97.9 80.0 - 100.0 fL   MCH 31.0 26.0 - 34.0 pg   MCHC 31.6 30.0 - 36.0 g/dL   RDW 13.6 11.5 - 15.5 %   Platelets 182 150 - 400 K/uL   nRBC 0.0 0.0 - 0.2 %   Neutrophils Relative % 89 %   Neutro Abs 18.9 (H) 1.7 - 7.7 K/uL   Lymphocytes Relative 2 %   Lymphs Abs 0.5 (L) 0.7 - 4.0 K/uL   Monocytes Relative 8 %   Monocytes Absolute 1.8 (H) 0.1 - 1.0 K/uL   Eosinophils Relative 0 %   Eosinophils Absolute 0.0 0.0 - 0.5 K/uL   Basophils Relative 0 %   Basophils Absolute 0.1 0.0 - 0.1 K/uL   Immature Granulocytes 1 %   Abs Immature Granulocytes 0.16 (H) 0.00 - 0.07 K/uL    Comment: Performed at [facility], [place] [address] [place],  [address]  Comprehensive metabolic panel     Status: Abnormal   Collection Time: 11/21/21  4:34 AM  Result Value Ref Range   Sodium 140 135 - 145 mmol/L   Potassium 3.8 3.5 - 5.1 mmol/L   Chloride 106 98 - 111 mmol/L   CO2 26 22 - 32 mmol/L   Glucose, Bld 125 (H) 70 - 99 mg/dL    Comment: Glucose reference range applies only to samples taken after fasting for at least 8 hours.   BUN 26 (H)  8 - 23 mg/dL   Creatinine, Ser 0.95 0.44 - 1.00 mg/dL   Calcium 8.5 (L) 8.9 - 10.3 mg/dL   Total Protein 6.1 (L) 6.5 - 8.1 g/dL   Albumin 3.0 (L) 3.5 - 5.0 g/dL   AST 33 15 - 41 U/L   ALT 26 0 - 44 U/L   Alkaline Phosphatase 91 38 - 126 U/L   Total Bilirubin 1.4 (H) 0.3 - 1.2 mg/dL   GFR, Estimated 56 (L) >60 mL/min    Comment: (NOTE) Calculated using the CKD-EPI Creatinine Equation (2021)    Anion gap 8 5 - 15    Comment: Performed at [facility], [place] [address] [place], [place] [address]   Magnesium     Status: None   Collection Time: 11/21/21  4:34 AM  Result Value Ref Range   Magnesium 1.9 1.7 - 2.4 mg/dL    Comment: Performed at [facility], [place] [address], [place], [place] [address]  Protime-INR     Status: Abnormal   Collection Time: 11/21/21  4:34 AM  Result Value Ref Range   Prothrombin Time 18.4 (H) 11.4 - 15.2 seconds   INR 1.6 (H) 0.8 - 1.2    Comment: (NOTE) INR goal varies based on device and disease states. Performed at [facility], [place] [address], [place], [place] [address]   Bilirubin, direct     Status: Abnormal   Collection Time: 11/21/21  4:34 AM  Result Value Ref Range   Bilirubin, Direct 0.3 (H) 0.0 - 0.2 mg/dL    Comment: Performed at [facility], [place] [address], [place], [place] [address]  Lactic acid, plasma     Status: None   Collection Time: 11/21/21 10:02 AM  Result Value Ref Range   Lactic Acid, Venous 1.9 0.5 - 1.9 mmol/L    Comment: Performed at [facility], [place] [address] [place], [place] [address]  Comprehensive metabolic panel     Status: Abnormal   Collection Time: 11/22/21  4:13 AM  Result Value Ref Range   Sodium 139 135 - 145 mmol/L   Potassium 3.3 (L) 3.5 - 5.1 mmol/L   Chloride 111 98 - 111 mmol/L   CO2 23 22 - 32 mmol/L   Glucose, Bld 119 (H) 70 - 99 mg/dL    Comment: Glucose reference range applies only to samples taken after fasting for at least 8 hours.   BUN 25 (H) 8 - 23 mg/dL   Creatinine, Ser 0.82 0.44 - 1.00 mg/dL   Calcium 7.6 (L) 8.9 - 10.3 mg/dL   Total Protein 5.3 (L) 6.5 - 8.1 g/dL   Albumin 2.5 (L) 3.5 - 5.0 g/dL   AST 34 15 - 41 U/L   ALT 26 0 - 44 U/L   Alkaline Phosphatase 85 38 - 126 U/L   Total Bilirubin 0.9 0.3 - 1.2 mg/dL   GFR, Estimated >60 >60 mL/min    Comment: (NOTE) Calculated using the CKD-EPI Creatinine Equation (2021)    Anion gap 5 5 - 15    Comment: Performed at [facility], [place]  [address] [place], [place] [address]  CBC with Differential/Platelet     Status: Abnormal   Collection Time: 11/22/21  4:13 AM  Result Value Ref Range   WBC 19.6 (H) 4.0 - 10.5 K/uL   RBC 3.91 3.87 - 5.11 MIL/uL   Hemoglobin 12.3 12.0 - 15.0 g/dL   HCT 38.8 36.0 - 46.0 %  MCV 99.2 80.0 - 100.0 fL   MCH 31.5 26.0 - 34.0 pg   MCHC 31.7 30.0 - 36.0 g/dL   RDW 13.9 11.5 - 15.5 %   Platelets 149 (L) 150 - 400 K/uL   nRBC 0.0 0.0 - 0.2 %   Neutrophils Relative % 89 %   Neutro Abs 17.4 (H) 1.7 - 7.7 K/uL   Lymphocytes Relative 2 %   Lymphs Abs 0.4 (L) 0.7 - 4.0 K/uL   Monocytes Relative 8 %   Monocytes Absolute 1.5 (H) 0.1 - 1.0 K/uL   Eosinophils Relative 0 %   Eosinophils Absolute 0.0 0.0 - 0.5 K/uL   Basophils Relative 0 %   Basophils Absolute 0.0 0.0 - 0.1 K/uL   Immature Granulocytes 1 %   Abs Immature Granulocytes 0.20 (H) 0.00 - 0.07 K/uL    Comment: Performed at [facility], [place] [address], [place], [place] [address]  Magnesium     Status: None   Collection Time: 11/22/21  4:13 AM  Result Value Ref Range   Magnesium 1.9 1.7 - 2.4 mg/dL    Comment: Performed at [facility], [place] [address], [place], [place] [address]    MR ABDOMEN MRCP W WO CONTAST  Result Date: 11/21/2021 CLINICAL DATA:  88 year old female with history of right upper quadrant abdominal pain. Evaluate for potential choledocholithiasis. EXAM: MRI ABDOMEN WITHOUT AND WITH CONTRAST (INCLUDING MRCP) TECHNIQUE: Multiplanar multisequence MR imaging of the abdomen was performed both before and after the administration of intravenous contrast. Heavily T2-weighted images of the biliary and pancreatic ducts were obtained, and three-dimensional MRCP images were rendered by post processing. CONTRAST:  38m GADAVIST GADOBUTROL 1 MMOL/ML IV SOLN COMPARISON:  No prior abdominal MRI. CT the abdomen and pelvis 11/20/2021. FINDINGS: Lower chest: Cardiomegaly. Hepatobiliary: No suspicious  cystic or solid hepatic lesions. Mild intrahepatic biliary ductal dilatation. Common bile duct is dilated measuring up to 11 mm in diameter on MRCP images. Numerous filling defects are noted within the common bile duct measuring up to 9 mm in diameter, compatible with choledocholithiasis. Gallbladder is severely distended. Filling defect lying dependently in the gallbladder, compatible with cholelithiasis. There is also a filling defect in the cystic duct (coronal image 9 of series 3) measuring 7 mm, compatible with a impacted stone. Gallbladder wall is thickened and edematous (4 mm) with trace amount of pericholecystic fluid. Pancreas: 5 mm T1 hypointense, T2 hyperintense, nonenhancing lesion in the tail of the pancreas (axial image 16 of series 4), benign in appearance. No solid-appearing pancreatic mass. No pancreatic ductal dilatation noted on MRCP images. Trace amount of T2 hyperintense fluid adjacent to the distal body and tail of the pancreas. Spleen:  Unremarkable. Adrenals/Urinary Tract: Right kidney is partially imaged, as it is in a low position in the lower abdomen. No suspicious renal lesions. Mild right hydronephrosis. No left-sided hydroureteronephrosis in the visualized portions of the abdomen. Bilateral adrenal glands are normal in appearance. Stomach/Bowel: Visualized portions are unremarkable. Vascular/Lymphatic: Aortic atherosclerosis without definite aneurysm in the visualized abdominal vasculature. No lymphadenopathy noted in the abdomen. Other:  Trace volume of perihepatic ascites. Musculoskeletal: No aggressive appearing osseous lesions are noted in the visualized portions of the skeleton. IMPRESSION: 1. Today's study is once again positive for cholelithiasis with impacted stone in the cystic duct and evidence of acute cholecystitis, as above. Surgical consultation is recommended. 2. There is also choledocholithiasis with mild intra and extrahepatic biliary ductal dilatation, as above. 3.  Trace amount of fluid  adjacent to the distal body and tail of the pancreas. Correlation with lipase levels is recommended to exclude the possibility of acute pancreatitis. 4. Trace volume of perihepatic ascites. 5. Low position of the right kidney, with mild right hydronephrosis. A cause of obstruction is not identified on today's examination, but based on correlation with prior CT examination, this may reflect a UPJ obstruction. 6. Aortic atherosclerosis. Electronically Signed   By: Vinnie Langton M.D.   On: 11/21/2021 09:49   MR 3D Recon At Scanner  Result Date: 11/21/2021 CLINICAL DATA:  88 year old female with history of right upper quadrant abdominal pain. Evaluate for potential choledocholithiasis. EXAM: MRI ABDOMEN WITHOUT AND WITH CONTRAST (INCLUDING MRCP) TECHNIQUE: Multiplanar multisequence MR imaging of the abdomen was performed both before and after the administration of intravenous contrast. Heavily T2-weighted images of the biliary and pancreatic ducts were obtained, and three-dimensional MRCP images were rendered by post processing. CONTRAST:  28m GADAVIST GADOBUTROL 1 MMOL/ML IV SOLN COMPARISON:  No prior abdominal MRI. CT the abdomen and pelvis 11/20/2021. FINDINGS: Lower chest: Cardiomegaly. Hepatobiliary: No suspicious cystic or solid hepatic lesions. Mild intrahepatic biliary ductal dilatation. Common bile duct is dilated measuring up to 11 mm in diameter on MRCP images. Numerous filling defects are noted within the common bile duct measuring up to 9 mm in diameter, compatible with choledocholithiasis. Gallbladder is severely distended. Filling defect lying dependently in the gallbladder, compatible with cholelithiasis. There is also a filling defect in the cystic duct (coronal image 9 of series 3) measuring 7 mm, compatible with a impacted stone. Gallbladder wall is thickened and edematous (4 mm) with trace amount of pericholecystic fluid. Pancreas: 5 mm T1 hypointense, T2 hyperintense,  nonenhancing lesion in the tail of the pancreas (axial image 16 of series 4), benign in appearance. No solid-appearing pancreatic mass. No pancreatic ductal dilatation noted on MRCP images. Trace amount of T2 hyperintense fluid adjacent to the distal body and tail of the pancreas. Spleen:  Unremarkable. Adrenals/Urinary Tract: Right kidney is partially imaged, as it is in a low position in the lower abdomen. No suspicious renal lesions. Mild right hydronephrosis. No left-sided hydroureteronephrosis in the visualized portions of the abdomen. Bilateral adrenal glands are normal in appearance. Stomach/Bowel: Visualized portions are unremarkable. Vascular/Lymphatic: Aortic atherosclerosis without definite aneurysm in the visualized abdominal vasculature. No lymphadenopathy noted in the abdomen. Other:  Trace volume of perihepatic ascites. Musculoskeletal: No aggressive appearing osseous lesions are noted in the visualized portions of the skeleton. IMPRESSION: 1. Today's study is once again positive for cholelithiasis with impacted stone in the cystic duct and evidence of acute cholecystitis, as above. Surgical consultation is recommended. 2. There is also choledocholithiasis with mild intra and extrahepatic biliary ductal dilatation, as above. 3. Trace amount of fluid adjacent to the distal body and tail of the pancreas. Correlation with lipase levels is recommended to exclude the possibility of acute pancreatitis. 4. Trace volume of perihepatic ascites. 5. Low position of the right kidney, with mild right hydronephrosis. A cause of obstruction is not identified on today's examination, but based on correlation with prior CT examination, this may reflect a UPJ obstruction. 6. Aortic atherosclerosis. Electronically Signed   By: DVinnie LangtonM.D.   On: 11/21/2021 09:49   DG Chest Port 1 View  Result Date: 11/21/2021 CLINICAL DATA:  Sepsis.  Pain and fever. EXAM: PORTABLE CHEST 1 VIEW COMPARISON:  October 24, 2021  FINDINGS: Cardiomegaly. The hila and mediastinum are unchanged. No pneumothorax. No nodules or masses. No focal infiltrates.  No cause for sepsis identified. IMPRESSION: No active disease. Electronically Signed   By: Dorise Bullion III M.D.   On: 11/21/2021 09:02   CT ABDOMEN PELVIS W CONTRAST  Result Date: 11/20/2021 CLINICAL DATA:  Worsening right lower quadrant pain for 2 days. 88 year old female. EXAM: CT ABDOMEN AND PELVIS WITH CONTRAST TECHNIQUE: Multidetector CT imaging of the abdomen and pelvis was performed using the standard protocol following bolus administration of intravenous contrast. RADIATION DOSE REDUCTION: This exam was performed according to the departmental dose-optimization program which includes automated exposure control, adjustment of the mA and/or kV according to patient size and/or use of iterative reconstruction technique. CONTRAST:  117m OMNIPAQUE IOHEXOL 300 MG/ML  SOLN COMPARISON:  CT with IV contrast 09/26/2017. FINDINGS: Lower chest: 4 mm chronic left lower lobe nodule in the posterolateral extreme base, stable. No follow-up is needed. There is mild cardiomegaly with a right chamber predominance indicating likely chronic right heart dysfunction. This was seen previously but has slightly worsened since 2019. Calcification partially visible in the aortic valve plane and seen previously. There are heavy calcifications in the descending thoracic aorta. Hepatobiliary: The liver 16 cm in length and mildly steatotic. There is no mass enhancement. The gallbladder is dilated up to nearly 11 cm, with wall thickening and pericholecystic fluid worrisome for acute cholecystitis. There are several small stones clustered in the fundus. There is increased intrahepatic and extrahepatic biliary dilatation with common bile duct now 10 mm, and there is choledocholithiasis not seen previously. This consists of several stones in the distal common bile duct, at least 4 and possibly as many as 5  stones, largest is 9.2 by 5 mm and the smallest is 4 mm. Pancreas: Partially atrophic but unchanged. Slightly prominent pancreatic duct is also unchanged. Spleen: Normal. Adrenals/Urinary Tract: There is no adrenal mass. There are few bilateral tiny cortical hypodensities in both kidneys which are too small to characterize. Some of them were present previously but a few of them are new. In any case no follow-up imaging is recommended. For reference seeJACR 2018 Feb; 264-273, Management of the Incidental Renal Mass on CT, [employer] 2021; 814-848, Bosniak Classification of Cystic Renal Masses, Version 2019. There is no urinary stone or obstruction.  No bladder thickening. Stomach/Bowel: The gastric wall is contracted. No small bowel dilatation or inflammation is seen. There is a normal caliber appendix. There is mild fecal stasis. There is advanced sigmoid diverticulosis, mild generalized wall thickening in the sigmoid colon which was seen previously and no findings concerning for acute inflammatory change. Vascular/Lymphatic: Moderate to heavy aortic and branch vessel atherosclerosis. Chronic left pelvic venous congestion. No enlarged lymph nodes. Reproductive: Small sized uterus versus prior supracervical hysterectomy, appearance unchanged. Again noted, there is a thin walled homogeneous uncomplicated cyst in the left adnexa likely arising from the left ovary, today measuring 4.0 x 3.4 cm, previously 2.9 x 3.6 cm, Hounsfield density of 4.3, and prior ultrasound 09/26/2017 demonstrating an anechoic uncomplicated cyst. Other: Aside from mild pericholecystic fluid, no other free fluid is seen. There is no free air, free hemorrhage, abscess or incarcerated hernia. There is a small umbilical fat hernia. Musculoskeletal: Osteopenia and degenerative change lumbar spine. No acute or other significant osseous findings. [place] left femoral head. IMPRESSION: 1. Likely acute cholecystitis, with cholelithiasis and  choledocholithiasis, with intrahepatic and extrahepatic biliary dilatation. 2. Constipation and diverticulosis. No bowel obstruction or inflammation. The sigmoid colon wall is chronically thickened but no more than previously. 3. Aortic atherosclerosis.  Chronic left pelvic  venous congestion. 4. Cardiomegaly with right chamber predominance, slightly worsened since 2019. 5. 4.0 x 3.4 cm uncomplicated left ovarian cyst, previously 2.9 x 3.6 cm. Continued annual ultrasound follow-up suggested. 6. Tiny renal hypodensities too small to characterize. No follow-up recommended. 7. Small umbilical fat hernia. 8. Osteopenia and degenerative change. Aortic Atherosclerosis (ICD10-I70.0). Electronically Signed   By: Telford Nab M.D.   On: 11/20/2021 23:20    Imaging: Personally reviewed  Data reviewed: vitals x 72hrs, labs x 72hrs, gi consult note 11/18,   TEE 10/28/21 TTEcho  10/25/21  Hospital dc summary 10/29/21; CT scan 11/17, MRI/MRCP 11/18 A/P: Lisa Lang is an 88 y.o. female with  Acute calculous cholecystitis Choledocholithiasis Fat containing umbilical hernia Hypokalemia Paroxysmal Afib on Eliquis (last dose Fri) MVP with mod to severe MR Left ovarian cyst   Patient Eliquis has been held [facility] is following.  Tentative plan is for ERCP on Monday She can have full liquid diet today n.p.o. after midnight Continue IV antibiotics Cardiology risk ratification still pending  Hypokalemia per [facility]  Discussed laparoscopic cholecystectomy versus cholecystostomy tube placement with the patient and the neighbor.  Went over the anatomy of the gallbladder and biliary system and left a drawing  Discussed the typical steps of cholecystectomy.  Also discussed potential subtotal cholecystectomy depending on intraoperative findings.  Discussed that whether or not we proceed with cholecystectomy during this admission depends on cardiology's risk assessment as well as no complications from her  ERCP.  Discussed the typical recovery from cholecystectomy.  Also discussed the typical management of a cholecystostomy tube should we go down that route.  We will follow along  Leighton Ruff. Redmond Pulling, MD, FACS General, Bariatric, & Minimally Invasive Surgery [place]

## 2021-11-23 ENCOUNTER — Inpatient Hospital Stay (HOSPITAL_COMMUNITY): Payer: Medicare Other

## 2021-11-23 ENCOUNTER — Inpatient Hospital Stay (HOSPITAL_COMMUNITY): Payer: Medicare Other | Admitting: Anesthesiology

## 2021-11-23 ENCOUNTER — Encounter (HOSPITAL_COMMUNITY): Payer: Self-pay | Admitting: Internal Medicine

## 2021-11-23 ENCOUNTER — Encounter (HOSPITAL_COMMUNITY)
Admission: EM | Disposition: A | Discharge status: Home / Self Care | Payer: Self-pay | Source: Home / Self Care | Attending: Internal Medicine

## 2021-11-23 DIAGNOSIS — M1990 Unspecified osteoarthritis, unspecified site: Secondary | ICD-10-CM | POA: Diagnosis not present

## 2021-11-23 DIAGNOSIS — K838 Other specified diseases of biliary tract: Secondary | ICD-10-CM

## 2021-11-23 DIAGNOSIS — I4891 Unspecified atrial fibrillation: Secondary | ICD-10-CM | POA: Diagnosis not present

## 2021-11-23 DIAGNOSIS — K8050 Calculus of bile duct without cholangitis or cholecystitis without obstruction: Secondary | ICD-10-CM

## 2021-11-23 DIAGNOSIS — K810 Acute cholecystitis: Secondary | ICD-10-CM | POA: Diagnosis not present

## 2021-11-23 HISTORY — PX: SPHINCTEROTOMY: SHX5544

## 2021-11-23 HISTORY — PX: REMOVAL OF STONES: SHX5545

## 2021-11-23 HISTORY — PX: ERCP: SHX5425

## 2021-11-23 LAB — URINE CULTURE: Culture: 20000 — AB

## 2021-11-23 LAB — TYPE AND SCREEN
ABO/RH(D): O POS
Antibody Screen: NEGATIVE

## 2021-11-23 LAB — COMPREHENSIVE METABOLIC PANEL
ALT: 27 U/L (ref 0–44)
AST: 31 U/L (ref 15–41)
Albumin: 2.9 g/dL — ABNORMAL LOW (ref 3.5–5.0)
Alkaline Phosphatase: 107 U/L (ref 38–126)
Anion gap: 5 (ref 5–15)
BUN: 18 mg/dL (ref 8–23)
CO2: 23 mmol/L (ref 22–32)
Calcium: 7.9 mg/dL — ABNORMAL LOW (ref 8.9–10.3)
Chloride: 110 mmol/L (ref 98–111)
Creatinine, Ser: 0.8 mg/dL (ref 0.44–1.00)
GFR, Estimated: >60 mL/min (ref >60)
Glucose, Bld: 111 mg/dL — ABNORMAL HIGH (ref 70–99)
Potassium: 3.6 mmol/L (ref 3.5–5.1)
Sodium: 138 mmol/L (ref 135–145)
Total Bilirubin: 0.6 mg/dL (ref 0.3–1.2)
Total Protein: 5.9 g/dL — ABNORMAL LOW (ref 6.5–8.1)

## 2021-11-23 LAB — CBC
HCT: 45 % (ref 36.0–46.0)
Hemoglobin: 14.3 g/dL (ref 12.0–15.0)
MCH: 31 pg (ref 26.0–34.0)
MCHC: 31.8 g/dL (ref 30.0–36.0)
MCV: 97.6 fL (ref 80.0–100.0)
Platelets: 183 10*3/uL (ref 150–400)
RBC: 4.61 MIL/uL (ref 3.87–5.11)
RDW: 14.1 % (ref 11.5–15.5)
WBC: 20.2 10*3/uL — ABNORMAL HIGH (ref 4.0–10.5)
nRBC: 0 % (ref 0.0–0.2)

## 2021-11-23 LAB — SURGICAL PCR SCREEN
MRSA, PCR: NEGATIVE
Staphylococcus aureus: NEGATIVE

## 2021-11-23 LAB — ABO/RH: ABO/RH(D): O POS

## 2021-11-23 LAB — MAGNESIUM: Magnesium: 2 mg/dL (ref 1.7–2.4)

## 2021-11-23 SURGERY — ENDOSCOPIC RETROGRADE CHOLANGIOPANCREATOGRAPHY (ERCP)
Anesthesia: General

## 2021-11-23 MED ORDER — SUGAMMADEX SODIUM 200 MG/2ML IV SOLN
INTRAVENOUS | Status: DC | PRN
  Administered 2021-11-23: 200 mg via INTRAVENOUS

## 2021-11-23 MED ORDER — FENTANYL CITRATE (PF) 100 MCG/2ML IJ SOLN
INTRAMUSCULAR | Status: AC
  Filled 2021-11-23: qty 2

## 2021-11-23 MED ORDER — DICLOFENAC SUPPOSITORY 100 MG
RECTAL | Status: AC
  Filled 2021-11-23: qty 1

## 2021-11-23 MED ORDER — ROCURONIUM BROMIDE 10 MG/ML (PF) SYRINGE
PREFILLED_SYRINGE | INTRAVENOUS | Status: DC | PRN
  Administered 2021-11-23: 40 mg via INTRAVENOUS

## 2021-11-23 MED ORDER — SUGAMMADEX SODIUM 200 MG/2ML IV SOLN: INTRAVENOUS | Status: DC | PRN

## 2021-11-23 MED ORDER — METRONIDAZOLE 500 MG/100ML IV SOLN
INTRAVENOUS | Status: DC | PRN
  Administered 2021-11-23: 500 mg via INTRAVENOUS

## 2021-11-23 MED ORDER — MUPIROCIN 2 % EX OINT
1.0000 | TOPICAL_OINTMENT | Freq: Two times a day (BID) | CUTANEOUS | Status: DC
  Administered 2021-11-23 – 2021-11-25 (×4): 1 via NASAL
  Filled 2021-11-23: qty 22

## 2021-11-23 MED ORDER — PHENYLEPHRINE 80 MCG/ML (10ML) SYRINGE FOR IV PUSH (FOR BLOOD PRESSURE SUPPORT)
PREFILLED_SYRINGE | INTRAVENOUS | Status: DC | PRN
  Administered 2021-11-23: 80 ug via INTRAVENOUS
  Administered 2021-11-23: 160 ug via INTRAVENOUS
  Administered 2021-11-23 (×3): 80 ug via INTRAVENOUS

## 2021-11-23 MED ORDER — PROPOFOL 10 MG/ML IV BOLUS
INTRAVENOUS | Status: DC | PRN
  Administered 2021-11-23: 70 mg via INTRAVENOUS

## 2021-11-23 MED ORDER — SODIUM CHLORIDE 0.9 % IV SOLN
INTRAVENOUS | Status: DC | PRN
  Administered 2021-11-23: 35 mL

## 2021-11-23 MED ORDER — FENTANYL CITRATE (PF) 100 MCG/2ML IJ SOLN
INTRAMUSCULAR | Status: DC | PRN
  Administered 2021-11-23: 50 ug via INTRAVENOUS

## 2021-11-23 MED ORDER — DICLOFENAC SUPPOSITORY 100 MG: 100.0000 mg | Freq: Once | RECTAL | Status: DC

## 2021-11-23 MED ORDER — DICLOFENAC SUPPOSITORY 100 MG
RECTAL | Status: DC | PRN
  Administered 2021-11-23: 100 mg via RECTAL

## 2021-11-23 MED ORDER — ONDANSETRON HCL 4 MG/2ML IJ SOLN
INTRAMUSCULAR | Status: DC | PRN
  Administered 2021-11-23: 4 mg via INTRAVENOUS

## 2021-11-23 MED ORDER — LIDOCAINE 2% (20 MG/ML) 5 ML SYRINGE
INTRAMUSCULAR | Status: DC | PRN
  Administered 2021-11-23: 50 mg via INTRAVENOUS

## 2021-11-23 MED ORDER — GLUCAGON HCL RDNA (DIAGNOSTIC) 1 MG IJ SOLR
INTRAMUSCULAR | Status: AC
  Filled 2021-11-23: qty 1

## 2021-11-23 MED ORDER — PROPOFOL 10 MG/ML IV BOLUS
INTRAVENOUS | Status: AC
  Filled 2021-11-23: qty 20

## 2021-11-23 NOTE — Anesthesia Procedure Notes (Signed)
Procedure Name: Intubation Date/Time: 11/23/2021 1:22 PM  Performed by: Deliah Boston, CRNAPre-anesthesia Checklist: Patient identified, Emergency Drugs available, Suction available and Patient being monitored Patient Re-evaluated:Patient Re-evaluated prior to induction Oxygen Delivery Method: Circle system utilized Preoxygenation: Pre-oxygenation with 100% oxygen Induction Type: IV induction Ventilation: Mask ventilation without difficulty Laryngoscope Size: Mac and 3 Grade View: Grade I Tube type: Oral Tube size: 7.0 mm Number of attempts: 1 Airway Equipment and Method: Stylet Placement Confirmation: ETT inserted through vocal cords under direct vision, positive ETCO2 and breath sounds checked- equal and bilateral Secured at: 21 cm Tube secured with: Tape Dental Injury: Teeth and Oropharynx as per pre-operative assessment

## 2021-11-23 NOTE — H&P (View-Only) (Signed)
     Patient seen and examined.  Neighbor at bedside.  Patient awake and alert after ERCP earlier this afternoon.  Appears comfortable.  Discussed results and plans for possible surgery tomorrow afternoon for cholecystectomy.  Will check in AM 11/21 and make final decision regarding OR.  Patient understands and agrees with plans for management.  Armandina Gemma, MD [facility] [place] [facility] Office: [phone]

## 2021-11-23 NOTE — Interval H&P Note (Signed)
History and Physical Interval Note: 89/female with CBD stones and cholecystitis for an ERCP with propofol.  11/23/2021 1:15 PM  Lisa Lang  has presented today for ERCP with propofol, with the diagnosis of cbc stones.  The various methods of treatment have been discussed with the patient and family. After consideration of risks, benefits and other options for treatment, the patient has consented to  Procedure(s): ENDOSCOPIC RETROGRADE CHOLANGIOPANCREATOGRAPHY (ERCP) (N/A) as a surgical intervention.  The patient's history has been reviewed, patient examined, no change in status, stable for surgery.  I have reviewed the patient's chart and labs.  Questions were answered to the patient's satisfaction.     Ronnette Juniper

## 2021-11-23 NOTE — Transfer of Care (Signed)
Immediate Anesthesia Transfer of Care Note  Patient: Lisa Lang  Procedure(s) Performed: Procedure(s): ENDOSCOPIC RETROGRADE CHOLANGIOPANCREATOGRAPHY (ERCP) (N/A) SPHINCTEROTOMY REMOVAL OF STONES  Patient Location: PACU  Anesthesia Type:General  Level of Consciousness: Patient easily awoken, sedated, comfortable, cooperative, following commands, responds to stimulation.   Airway & Oxygen Therapy: Patient spontaneously breathing, ventilating well, oxygen via simple oxygen mask.  Post-op Assessment: Report given to PACU RN, vital signs reviewed and stable, moving all extremities.   Post vital signs: Reviewed and stable.  Complications: No apparent anesthesia complications  Last Vitals:  Vitals Value Taken Time  BP 130/44 11/23/21 1420  Temp 36.7 C 11/23/21 1420  Pulse 81 11/23/21 1421  Resp 19 11/23/21 1421  SpO2 99 % 11/23/21 1421  Vitals shown include unvalidated device data.  Last Pain:  Vitals:   11/23/21 1420  TempSrc: Temporal  PainSc: 0-No pain      Patients Stated Pain Goal: 0 (68/15/94 7076)  Complications: No notable events documented.

## 2021-11-23 NOTE — Progress Notes (Signed)
[facility] Progress Note     Subjective: CC:  Cc RUQ pain, worse with movement and deep inspiration. Reports some non-bloody loose stool. +flatus. Denies nausea or vomiting.  Objective: Vital signs in last 24 hours: Temp:  [98.1 F (36.7 C)-99.1 F (37.3 C)] 98.1 F (36.7 C) (11/20 0934) Pulse Rate:  [75-106] 106 (11/20 0934) Resp:  [16-22] 18 (11/20 0934) BP: (117-146)/(48-72) 146/68 (11/20 0934) SpO2:  [92 %-98 %] 98 % (11/20 0934) Weight:  [54 kg] 54 kg (11/20 0512) Last BM Date : 11/23/21  Intake/Output from previous day: 11/19 0701 - 11/20 0700 In: 1376 [I.V.:294.1; IV Piggyback:1081.9] Out: -  Intake/Output this shift: No intake/output data recorded.  PE: Gen:  Alert, NAD, pleasant elderly female Card:  Regular rate and rhythm, no lower extremity edema  Pulm:  Normal effort, clear to auscultation bilaterally Abd: Soft, TTP RUQ with guarding Skin: warm and dry, no rashes  Psych: A&Ox3   Lab Results:  Recent Labs    11/22/21 0413 11/23/21 0538  WBC 19.6* 20.2*  HGB 12.3 14.3  HCT 38.8 45.0  PLT 149* 183   BMET Recent Labs    11/22/21 0413 11/23/21 0538  NA 139 138  K 3.3* 3.6  CL 111 110  CO2 23 23  GLUCOSE 119* 111*  BUN 25* 18  CREATININE 0.82 0.80  CALCIUM 7.6* 7.9*   PT/INR Recent Labs    11/21/21 0434  LABPROT 18.4*  INR 1.6*   CMP     Component Value Date/Time   NA 138 11/23/2021 0538   K 3.6 11/23/2021 0538   CL 110 11/23/2021 0538   CO2 23 11/23/2021 0538   GLUCOSE 111 (H) 11/23/2021 0538   BUN 18 11/23/2021 0538   CREATININE 0.80 11/23/2021 0538   CALCIUM 7.9 (L) 11/23/2021 0538   PROT 5.9 (L) 11/23/2021 0538   ALBUMIN 2.9 (L) 11/23/2021 0538   AST 31 11/23/2021 0538   ALT 27 11/23/2021 0538   ALKPHOS 107 11/23/2021 0538   BILITOT 0.6 11/23/2021 0538   GFRNONAA >60 11/23/2021 0538   GFRAA 57 (L) 09/28/2017 0413   Lipase     Component Value Date/Time   LIPASE 25 11/20/2021 2054        Studies/Results: No results found.  Anti-infectives: Anti-infectives (From admission, onward)    Start     Dose/Rate Route Frequency Ordered Stop   11/21/21 2200  cefTRIAXone (ROCEPHIN) 2 g in sodium chloride 0.9 % 100 mL IVPB        2 g 200 mL/hr over 30 Minutes Intravenous Every 24 hours 11/21/21 0029     11/21/21 0030  metroNIDAZOLE (FLAGYL) IVPB 500 mg        500 mg 100 mL/hr over 60 Minutes Intravenous Every 12 hours 11/21/21 0029     11/20/21 2230  cefTRIAXone (ROCEPHIN) 1 g in sodium chloride 0.9 % 100 mL IVPB        1 g 200 mL/hr over 30 Minutes Intravenous  Once 11/20/21 2218 11/20/21 2331        Assessment/Plan Choledocholithiasis  Acute calculous cholecystitis - afebrile, VSS,  - going for ERCP today, we will follow results - appreciate cardiology seeing for afib (eliquis held). Will discuss with Dr. Harlow Asa. Anticipate possible lap chole tomorrow - continue abx    LOS: 2 days   I reviewed nursing notes, Consultant GI, cardiology notes, hospitalist notes, last 24 h vitals and pain scores, last 48 h intake and output, last 24 h labs  and trends, and last 24 h imaging results.   Obie Dredge, PA-C [facility] Please see Amion for pager number during day hours 7:00am-4:30pm

## 2021-11-23 NOTE — Progress Notes (Addendum)
PROGRESS NOTE  Lisa Lang  OIT:[id number] DOB: 1927-01-26 DOA: 11/20/2021 PCP: Lavone Orn, MD   Brief Narrative:  Patient is a 88 year old female with history of paroxysmal A-fib on Eliquis, hyperlipidemia who presented with right upper quadrant abdominal discomfort, nausea, vomiting, subjective fevers at home.  On presentation, she was hemodynamically stable.  Lab work showed creatinine of 1.06, bilirubin of 1.7, normal liver enzymes, lipase of 25, WBC count of 21.3.  CT abdomen/pelvis showed dilated gallbladder up to 11 cm with associated gallbladder thickening, pericholecystic fluid consistent with acute cholecystitis, also showed stone clusters in the gallbladder fundus as well as evidence of intrahepatic/extrahepatic biliary dilatation with the common bile duct of 10 mm in addition to showing evidence of radiopaque choledocholithiasis.  MRCP confirmed stone in the cystic duct, choledocholithiasis.  Patient has been started on antibiotics.  GI, general surgery consulted.  Plan for ERCP today ,followed by cholecystectomy at some point  Assessment & Plan:  Principal Problem:   Acute cholecystitis Active Problems:   Paroxysmal atrial fibrillation (HCC)   HLD (hyperlipidemia)   Choledocholithiasis   Sepsis ([place])   Dehydration   Acute cystitis   Acute cholecystitis: Presented with abdominal pain, subjective fever, nausea, vomiting.  Imaging as above.  Currently on ceftriaxone, Flagyl.  Follow-up cultures.NGTD General surgery and GI planning following.  Plan for ERCP today .  She will eventually need cholecystectomy.  Continue gentle IV fluids. Leukocytosis persists.  She is afebrile  Sepsis: Met sepsis criteria on admission with leukocytosis, tachypnea.  Currently hemodynamically stable.  Cultures have been sent, so far negative.  Urine culture showed 20,000 colonies of E. coli which is pansensitive.  Patient has no urinary symptoms.  Continue current antibiotics  Paroxysmal A-fib:  Currently in normal sinus rhythm.  On Eliquis for anticoagulation, which is on hold.  On metoprolol for rate control at home.  Had DCCV for A-fib on 11/06/2021.   Cardiology started on IV amiodarone  MVP: Noted on recent echo.  Management as per cardiology.  Hyperlipidemia: On Lipitor at home  Hypokalemia: Supplemented and corrected        DVT prophylaxis:SCDs Start: 11/21/21 0027     Code Status: Full Code  Family Communication: Daughter at bedside on 11/20  Patient status:Inpatient  Patient is from :Home  Anticipated discharge RA:XENM  Estimated DC date:2-3 days   Consultants: GI,Surgery  Procedures:MRCP  Antimicrobials:  Anti-infectives (From admission, onward)    Start     Dose/Rate Route Frequency Ordered Stop   11/21/21 2200  cefTRIAXone (ROCEPHIN) 2 g in sodium chloride 0.9 % 100 mL IVPB        2 g 200 mL/hr over 30 Minutes Intravenous Every 24 hours 11/21/21 0029     11/21/21 0030  metroNIDAZOLE (FLAGYL) IVPB 500 mg        500 mg 100 mL/hr over 60 Minutes Intravenous Every 12 hours 11/21/21 0029     11/20/21 2230  cefTRIAXone (ROCEPHIN) 1 g in sodium chloride 0.9 % 100 mL IVPB        1 g 200 mL/hr over 30 Minutes Intravenous  Once 11/20/21 2218 11/20/21 2331       Subjective: Patient seen and examined at bedside today.  Hemodynamically stable.  No nausea or vomiting.  Had small liquid bowel movement this morning.Still has some abdominal discomfort but not worsening  Objective: Vitals:   11/22/21 2051 11/23/21 0118 11/23/21 0512 11/23/21 0934  BP: (!) 128/50 121/62 119/72 (!) 146/68  Pulse: 86 75 78 (!) 106  Resp: 16 (!) _0 Temp: 99.1 F (37.3 C) 98.2 F (36.8 C) 98.6 F (37 C) 98.1 F (36.7 C)  TempSrc: Oral Oral Oral Oral  SpO2: 95% 93% 92% 98%  Weight:   54 kg     Intake/Output Summary (Last 24 hours) at 11/23/2021 1128 Last data filed at 11/23/2021 0076 Gross per 24 hour  Intake 1375.95 ml  Output --  Net 1375.95 ml   Filed  Weights   11/22/21 0500 11/23/21 0512  Weight: 49.9 kg 54 kg    Examination:  General exam: Overall comfortable, not in distress, pleasant elderly female HEENT: PERRL Respiratory system:  no wheezes or crackles  Cardiovascular system: S1 & S2 heard, RRR.  Gastrointestinal system: Abdomen is mildly distended, tender in the right upper quadrant Central nervous system: Alert and oriented Extremities: No edema, no clubbing ,no cyanosis Skin: No rashes, no ulcers,no icterus      Data Reviewed: I have personally reviewed following labs and imaging studies  CBC: Recent Labs  Lab 11/20/21 2054 11/21/21 0434 11/22/21 0413 11/23/21 0538  WBC 21.3* 21.4* 19.6* 20.2*  NEUTROABS 17.4* 18.9* 17.4*  --   HGB 14.8 13.1 12.3 14.3  HCT 46.1* 41.4 38.8 45.0  MCV 97.1 97.9 99.2 97.6  PLT 195 182 149* 226   Basic Metabolic Panel: Recent Labs  Lab 11/20/21 2054 11/21/21 0434 11/22/21 0413 11/23/21 0538  NA 137 140 139 138  K 4.0 3.8 3.3* 3.6  CL 100 106 111 110  CO2 _1 GLUCOSE 137* 125* 119* 111*  BUN 27* 26* 25* 18  CREATININE 1.06* 0.95 0.82 0.80  CALCIUM 9.3 8.5* 7.6* 7.9*  MG  --  1.9 1.9 2.0     Recent Results (from the past 240 hour(s))  Urine Culture     Status: Abnormal   Collection Time: 11/20/21  8:24 PM   Specimen: Urine, Clean Catch  Result Value Ref Range Status   Specimen Description   Final    URINE, CLEAN CATCH Performed at [place] [address], [place], [place] [address]    Special Requests   Final    NONE Performed at [facility], [place] [address], [place], [place] [address]    Culture (A)  Final    20,000 COLONIES/mL ESCHERICHIA COLI CRITICAL RESULT CALLED TO, READ BACK BY AND VERIFIED WITH: PHARMD G GADHIA [id number] AT 728 AM BY CM Performed at [facility], [place] [address], [place], [place] [address]    Report Status 11/23/2021 FINAL  Final   Organism ID, Bacteria ESCHERICHIA COLI (A)  Final       Susceptibility   Escherichia coli - MIC*    AMPICILLIN <=2 SENSITIVE Sensitive     CEFAZOLIN <=4 SENSITIVE Sensitive     CEFEPIME <=0.12 SENSITIVE Sensitive     CEFTRIAXONE <=0.25 SENSITIVE Sensitive     CIPROFLOXACIN <=0.25 SENSITIVE Sensitive     GENTAMICIN <=1 SENSITIVE Sensitive     IMIPENEM <=0.25 SENSITIVE Sensitive     NITROFURANTOIN <=16 SENSITIVE Sensitive     TRIMETH/SULFA <=20 SENSITIVE Sensitive     AMPICILLIN/SULBACTAM <=2 SENSITIVE Sensitive     PIP/TAZO <=4 SENSITIVE Sensitive     * 20,000 COLONIES/mL ESCHERICHIA COLI  Surgical PCR screen     Status: None   Collection Time: 11/23/21  5:49 AM   Specimen: Nasal Mucosa; Nasal Swab  Result Value Ref Range Status   MRSA, PCR NEGATIVE NEGATIVE Final  Staphylococcus aureus NEGATIVE NEGATIVE Final    Comment: (NOTE) The Xpert SA Assay (FDA approved for NASAL specimens in patients 84 years of age and older), is one component of a comprehensive surveillance program. It is not intended to diagnose infection nor to guide or monitor treatment. Performed at [facility], [place] [address] [place], [place] [address]      Radiology Studies: No results found.  Scheduled Meds:  diclofenac  100 mg Rectal Once   feeding supplement  1 Container Oral TID BM   mupirocin ointment  1 Application Nasal BID   Continuous Infusions:  sodium chloride 75 mL/hr at 11/22/21 2053   sodium chloride     amiodarone 30 mg/hr (11/23/21 0102)   cefTRIAXone (ROCEPHIN)  IV 2 g (11/22/21 2054)   metronidazole 500 mg (11/23/21 0107)     LOS: 2 days   Shelly Coss, MD [facility] P11/20/2023, 11:28 AM

## 2021-11-23 NOTE — Op Note (Signed)
[facility] Patient Name: Lisa Lang Procedure Date: 11/23/2021 MRN: [id number] Attending MD: Ronnette Juniper , MD, [id number] Date of Birth: 1927-04-29 CSN: [id number] Age: 88 Admit Type: Inpatient Procedure:                ERCP Indications:              Common bile duct stone(s) Providers:                Ronnette Juniper, MD, Dulcy Fanny, Brien Mates,                            Technician, Heide Scales, CRNA Referring MD:             [facility], General Surgery Medicines:                Monitored Anesthesia Care Complications:            No immediate complications. Estimated blood loss:                            Minimal. Estimated Blood Loss:     Estimated blood loss was minimal. Estimated blood                            loss was minimal. Procedure:                Pre-Anesthesia Assessment:                           - Prior to the procedure, a History and Physical                            was performed, and patient medications and                            allergies were reviewed. The patient's tolerance of                            previous anesthesia was also reviewed. The risks                            and benefits of the procedure and the sedation                            options and risks were discussed with the patient.                            All questions were answered, and informed consent                            was obtained. Prior Anticoagulants: The patient has                            taken Eliquis (apixaban), last dose was 3 days  prior to procedure. ASA Grade Assessment: III - A                            patient with severe systemic disease. After                            reviewing the risks and benefits, the patient was                            deemed in satisfactory condition to undergo the                            procedure.                           After obtaining informed consent, the  scope was                            passed under direct vision. Throughout the                            procedure, the patient's blood pressure, pulse, and                            oxygen saturations were monitored continuously. The                            [id number]-[id number] ([id number] Olympus duodenoscope was                            introduced through the mouth, and used to inject                            contrast into and used to inject contrast into the                            bile duct. The patient tolerated the procedure                            well. The ERCP was somewhat difficult due to                            significant looping. Successful completion of the                            procedure was aided by applying abdominal pressure. Scope In: Scope Out: Findings:      The scout film was normal. The esophagus was successfully intubated       under direct vision. The scope was advanced to a normal major papilla in       the descending duodenum without detailed examination of the pharynx,       larynx and associated structures, and upper GI tract. The upper GI tract       was grossly normal.A small duodenal diverticulum was noted below the  ampulla.      The bile duct was readily and deeply cannulated with the sphincterotome       om the first attempt. Contrast was injected. I personally interpreted       the bile duct images. There was brisk flow of contrast through the       ducts. Image quality was excellent. Contrast extended to the main bile       duct.      The middle third of the main bile duct and upper third of the main bile       duct contained three stones, the largest of which was 9 mm in diameter.       The main bile duct was moderately dilated. The largest diameter was 12       mm.      A straight Roadrunner wire was passed into the biliary tree. A 10 mm       biliary sphincterotomy was made with a monofilament sphincterotome using       ERBE  electrocautery. There was self limited oozing from the       sphincterotomy which did not require treatment.      The biliary tree was swept with a 12 mm balloon starting at the       bifurcation. Three stones were removed. No stones remained.      The pancreatic duct was not canulated or injected intentionally. Impression:               - The entire main bile duct was moderately dilated.                           - Choledocholithiasis was found. Complete removal                            was accomplished by biliary sphincterotomy and                            balloon extraction.                           - A biliary sphincterotomy was performed.                           - The biliary tree was swept. Moderate Sedation:      Patient did not receive moderate sedation for this procedure, but       instead received monitored anesthesia care. Recommendation:           - Advance diet as tolerated.                           - Recommend cholecystectomy as GB appeared                            distended and filled with stones and a stone was                            also noted in the cystic duct. Procedure Code(s):        --- Professional ---  [phone], Endoscopic retrograde                            cholangiopancreatography (ERCP); with removal of                            calculi/debris from biliary/pancreatic duct(s)                           [id number], Endoscopic retrograde                            cholangiopancreatography (ERCP); with                            sphincterotomy/papillotomy                           [phone], Endoscopic catheterization of the biliary                            ductal system, radiological supervision and                            interpretation Diagnosis Code(s):        --- Professional ---                           K80.50, Calculus of bile duct without cholangitis                            or cholecystitis without obstruction                            K83.8, Other specified diseases of biliary tract CPT copyright 2022 American Medical Association. All rights reserved. The codes documented in this report are preliminary and upon coder review may  be revised to meet current compliance requirements. Ronnette Juniper, MD 11/23/2021 2:08:44 PM This report has been signed electronically. Number of Addenda: 0

## 2021-11-23 NOTE — Progress Notes (Signed)
     Patient seen and examined.  Neighbor at bedside.  Patient awake and alert after ERCP earlier this afternoon.  Appears comfortable.  Discussed results and plans for possible surgery tomorrow afternoon for cholecystectomy.  Will check in AM 11/21 and make final decision regarding OR.  Patient understands and agrees with plans for management.  Armandina Gemma, MD [facility] [place] practice Office: [phone]

## 2021-11-23 NOTE — Progress Notes (Addendum)
Rounding Note    Patient Name: LANISE MERGEN Date of Encounter: 11/23/2021  [place] Cardiologist: Glenetta Hew, MD   Subjective   Feels pretty good, is awaiting ERCP this pm, hurts to take a deep breath  Inpatient Medications    Scheduled Meds:  diclofenac  100 mg Rectal Once   feeding supplement  1 Container Oral TID BM   mupirocin ointment  1 Application Nasal BID   Continuous Infusions:  sodium chloride 75 mL/hr at 11/22/21 2053   sodium chloride     amiodarone 30 mg/hr (11/23/21 0102)   cefTRIAXone (ROCEPHIN)  IV 2 g (11/22/21 2054)   metronidazole 500 mg (11/23/21 0107)   PRN Meds: acetaminophen **OR** acetaminophen, HYDROmorphone (DILAUDID) injection, naLOXone (NARCAN)  injection, ondansetron (ZOFRAN) IV, oxyCODONE   Vital Signs    Vitals:   11/22/21 1302 11/22/21 2051 11/23/21 0118 11/23/21 0512  BP: (!) 117/48 (!) 128/50 121/62 119/72  Pulse: 84 86 75 78  Resp:  16 (!) 22 20  Temp:  99.1 F (37.3 C) 98.2 F (36.8 C) 98.6 F (37 C)  TempSrc:  Oral Oral Oral  SpO2: 95% 95% 93% 92%  Weight:    54 kg    Intake/Output Summary (Last 24 hours) at 11/23/2021 0746 Last data filed at 11/23/2021 1740 Gross per 24 hour  Intake 1375.95 ml  Output --  Net 1375.95 ml      11/23/2021    5:12 AM 11/22/2021    5:00 AM 11/12/2021    3:23 PM  Last 3 Weights  Weight (lbs) 119 lb 0.8 oz 110 lb 106 lb 6.4 oz  Weight (kg) 54 kg 49.896 kg 48.263 kg      Telemetry    SR - Personally Reviewed  ECG    None today  - Personally Reviewed  Physical Exam   GEN: frail, elderly female, No acute distress.   Neck: No JVD Cardiac: RRR, 2/6 murmurs, no rubs, + gallop Respiratory: Clear to auscultation bilaterally. GI: Soft, ++tender, non-distended  MS: No edema; No deformity. Neuro:  Nonfocal  Psych: Normal affect   Labs    High Sensitivity Troponin:   Recent Labs  Lab 10/24/21 1105 10/24/21 1308  TROPONINIHS 21* 15     Chemistry Recent  Labs  Lab 11/21/21 0434 11/22/21 0413 11/23/21 0538  NA 140 139 138  K 3.8 3.3* 3.6  CL 106 111 110  CO2 '26 23 23  '$ GLUCOSE 125* 119* 111*  BUN 26* 25* 18  CREATININE 0.95 0.82 0.80  CALCIUM 8.5* 7.6* 7.9*  MG 1.9 1.9 2.0  PROT 6.1* 5.3* 5.9*  ALBUMIN 3.0* 2.5* 2.9*  AST 33 34 31  ALT '26 26 27  '$ ALKPHOS 91 85 107  BILITOT 1.4* 0.9 0.6  GFRNONAA 56* >60 >60  ANIONGAP '8 5 5    '$ Lipids No results for input(s): "CHOL", "TRIG", "HDL", "LABVLDL", "LDLCALC", "CHOLHDL" in the last 168 hours.  Hematology Recent Labs  Lab 11/21/21 0434 11/22/21 0413 11/23/21 0538  WBC 21.4* 19.6* 20.2*  RBC 4.23 3.91 4.61  HGB 13.1 12.3 14.3  HCT 41.4 38.8 45.0  MCV 97.9 99.2 97.6  MCH 31.0 31.5 31.0  MCHC 31.6 31.7 31.8  RDW 13.6 13.9 14.1  PLT 182 149* 183   Thyroid No results for input(s): "TSH", "FREET4" in the last 168 hours.  BNPNo results for input(s): "BNP", "PROBNP" in the last 168 hours.  DDimer No results for input(s): "DDIMER" in the last 168 hours.  Radiology    MR ABDOMEN MRCP W WO CONTAST  Result Date: 11/21/2021 CLINICAL DATA:  88 year old female with history of right upper quadrant abdominal pain. Evaluate for potential choledocholithiasis. EXAM: MRI ABDOMEN WITHOUT AND WITH CONTRAST (INCLUDING MRCP) TECHNIQUE: Multiplanar multisequence MR imaging of the abdomen was performed both before and after the administration of intravenous contrast. Heavily T2-weighted images of the biliary and pancreatic ducts were obtained, and three-dimensional MRCP images were rendered by post processing. CONTRAST:  19m GADAVIST GADOBUTROL 1 MMOL/ML IV SOLN COMPARISON:  No prior abdominal MRI. CT the abdomen and pelvis 11/20/2021. FINDINGS: Lower chest: Cardiomegaly. Hepatobiliary: No suspicious cystic or solid hepatic lesions. Mild intrahepatic biliary ductal dilatation. Common bile duct is dilated measuring up to 11 mm in diameter on MRCP images. Numerous filling defects are noted within the common  bile duct measuring up to 9 mm in diameter, compatible with choledocholithiasis. Gallbladder is severely distended. Filling defect lying dependently in the gallbladder, compatible with cholelithiasis. There is also a filling defect in the cystic duct (coronal image 9 of series 3) measuring 7 mm, compatible with a impacted stone. Gallbladder wall is thickened and edematous (4 mm) with trace amount of pericholecystic fluid. Pancreas: 5 mm T1 hypointense, T2 hyperintense, nonenhancing lesion in the tail of the pancreas (axial image 16 of series 4), benign in appearance. No solid-appearing pancreatic mass. No pancreatic ductal dilatation noted on MRCP images. Trace amount of T2 hyperintense fluid adjacent to the distal body and tail of the pancreas. Spleen:  Unremarkable. Adrenals/Urinary Tract: Right kidney is partially imaged, as it is in a low position in the lower abdomen. No suspicious renal lesions. Mild right hydronephrosis. No left-sided hydroureteronephrosis in the visualized portions of the abdomen. Bilateral adrenal glands are normal in appearance. Stomach/Bowel: Visualized portions are unremarkable. Vascular/Lymphatic: Aortic atherosclerosis without definite aneurysm in the visualized abdominal vasculature. No lymphadenopathy noted in the abdomen. Other:  Trace volume of perihepatic ascites. Musculoskeletal: No aggressive appearing osseous lesions are noted in the visualized portions of the skeleton. IMPRESSION: 1. Today's study is once again positive for cholelithiasis with impacted stone in the cystic duct and evidence of acute cholecystitis, as above. Surgical consultation is recommended. 2. There is also choledocholithiasis with mild intra and extrahepatic biliary ductal dilatation, as above. 3. Trace amount of fluid adjacent to the distal body and tail of the pancreas. Correlation with lipase levels is recommended to exclude the possibility of acute pancreatitis. 4. Trace volume of perihepatic ascites.  5. Low position of the right kidney, with mild right hydronephrosis. A cause of obstruction is not identified on today's examination, but based on correlation with prior CT examination, this may reflect a UPJ obstruction. 6. Aortic atherosclerosis. Electronically Signed   By: DVinnie LangtonM.D.   On: 11/21/2021 09:49   MR 3D Recon At Scanner  Result Date: 11/21/2021 CLINICAL DATA:  88year old female with history of right upper quadrant abdominal pain. Evaluate for potential choledocholithiasis. EXAM: MRI ABDOMEN WITHOUT AND WITH CONTRAST (INCLUDING MRCP) TECHNIQUE: Multiplanar multisequence MR imaging of the abdomen was performed both before and after the administration of intravenous contrast. Heavily T2-weighted images of the biliary and pancreatic ducts were obtained, and three-dimensional MRCP images were rendered by post processing. CONTRAST:  534mGADAVIST GADOBUTROL 1 MMOL/ML IV SOLN COMPARISON:  No prior abdominal MRI. CT the abdomen and pelvis 11/20/2021. FINDINGS: Lower chest: Cardiomegaly. Hepatobiliary: No suspicious cystic or solid hepatic lesions. Mild intrahepatic biliary ductal dilatation. Common bile duct is dilated measuring up to 11 mm  in diameter on MRCP images. Numerous filling defects are noted within the common bile duct measuring up to 9 mm in diameter, compatible with choledocholithiasis. Gallbladder is severely distended. Filling defect lying dependently in the gallbladder, compatible with cholelithiasis. There is also a filling defect in the cystic duct (coronal image 9 of series 3) measuring 7 mm, compatible with a impacted stone. Gallbladder wall is thickened and edematous (4 mm) with trace amount of pericholecystic fluid. Pancreas: 5 mm T1 hypointense, T2 hyperintense, nonenhancing lesion in the tail of the pancreas (axial image 16 of series 4), benign in appearance. No solid-appearing pancreatic mass. No pancreatic ductal dilatation noted on MRCP images. Trace amount of T2  hyperintense fluid adjacent to the distal body and tail of the pancreas. Spleen:  Unremarkable. Adrenals/Urinary Tract: Right kidney is partially imaged, as it is in a low position in the lower abdomen. No suspicious renal lesions. Mild right hydronephrosis. No left-sided hydroureteronephrosis in the visualized portions of the abdomen. Bilateral adrenal glands are normal in appearance. Stomach/Bowel: Visualized portions are unremarkable. Vascular/Lymphatic: Aortic atherosclerosis without definite aneurysm in the visualized abdominal vasculature. No lymphadenopathy noted in the abdomen. Other:  Trace volume of perihepatic ascites. Musculoskeletal: No aggressive appearing osseous lesions are noted in the visualized portions of the skeleton. IMPRESSION: 1. Today's study is once again positive for cholelithiasis with impacted stone in the cystic duct and evidence of acute cholecystitis, as above. Surgical consultation is recommended. 2. There is also choledocholithiasis with mild intra and extrahepatic biliary ductal dilatation, as above. 3. Trace amount of fluid adjacent to the distal body and tail of the pancreas. Correlation with lipase levels is recommended to exclude the possibility of acute pancreatitis. 4. Trace volume of perihepatic ascites. 5. Low position of the right kidney, with mild right hydronephrosis. A cause of obstruction is not identified on today's examination, but based on correlation with prior CT examination, this may reflect a UPJ obstruction. 6. Aortic atherosclerosis. Electronically Signed   By: Vinnie Langton M.D.   On: 11/21/2021 09:49    Cardiac Studies   TEE/DCCV: 10/28/2021  1. Left ventri/cular ejection fraction, by estimation, is 65 to 70%. The left ventricle has normal function.   2. Right ventricular systolic function is normal. The right ventricular size is normal.   3. Left atrial size was moderately dilated. No left atrial/left atrial appendage thrombus was detected.   4.  Right atrial size was moderately dilated.   5. The mitral valve is myxomatous with a large overriding anterior mitral valve leaflet and a diminutive posterior leaflet that measures about 8-22m in length. 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 Overall, mitral regurgitation appears moderate (EROA of posterior jet 0.2cm2, RVol 259m. There is no systolic flow reversal in the pulmonary veins. PASP is 2431m +RAP. . TMarland Kitchene mitral valve is myxomatous. Moderate mitral valve regurgitation.   6. Tricuspid valve regurgitation is mild to moderate.   7. The aortic valve is tricuspid. There is mild calcification of the aortic valve. There is mild thickening of the aortic valve. Aortic valve regurgitation is mild. Aortic valve sclerosis/calcification is present, without any evidence of aortic stenosis.   8. Following TEE, the patient underwent successful DCCV with 150J x1.   ECHO: 10/25/2021  1. Left ventricular ejection fraction, by estimation, is 65 to 70%. The left ventricle has normal function. The left ventricle has no regional wall motion abnormalities. Left ventricular diastolic function could not be evaluated.  2. Right ventricular systolic function is normal. The right ventricular size is normal. There is moderately elevated pulmonary artery systolic pressure. The estimated right ventricular systolic pressure is 93.7 mmHg.   3. Left atrial size was moderately dilated.   4. Right atrial size was moderately dilated.   5. 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 The posterior leaflet is dimunitive and there is at least moderate posterior MR. The mitral valve is myxomatous. Moderate mitral valve regurgitation. There is severe prolapse of both leaflets of the mitral valve. Moderate mitral annular calcification.   6. Tricuspid valve regurgitation is moderate.   7. The aortic valve is tricuspid. There is moderate calcification of the  aortic valve. Aortic valve regurgitation is mild to moderate. Aortic valve sclerosis/calcification is present, without any evidence of aortic stenosis.  Patient Profile     88 y.o. female w/ Hx PAF and recent TEE/DCCV on amio and low-dose Eliquis, HLD, MVP, was admitted 11/18 with acute cholecystitis, impacted cystic duct stone.  Cards asked to see preop.  Assessment & Plan    PAF - maintaining SR on IV amio, anticoag held for procedures  - continue IV rx till no more procedures - restart Eliquis when ok w/ IM/GI  2. Acute cholecystits w/ CBD stones - for ERCP this pm - per family, they may do cholecystectomy later this week  3. MVP - no sx heart failure - follow, add incentive spirometry  Otherwise, per IM  For questions or updates, please contact [place] Please consult [email or web address] for contact info under        Signed, Rosaria Ferries, PA-C  11/23/2021, 7:46 AM    Personally seen and examined. Agree with above.  88 year old with paroxysmal atrial fibrillation maintaining sinus rhythm on IV amiodarone.  Underwent cardioversion late October 2023.  Anticoagulation Eliquis has been held for ERCP.  She may proceed from a cardiology perspective.  Restart Eliquis when okay from bleeding perspective.  Ejection fraction is normal.  Moderate mitral valve regurgitation noted on most recent echocardiogram.  Clinically stable.  Very pleasant.  Feels mild abdominal discomfort. leukocytosis noted  Candee Furbish, MD

## 2021-11-23 NOTE — Anesthesia Postprocedure Evaluation (Signed)
Anesthesia Post Note  Patient: Lisa Lang  Procedure(s) Performed: ENDOSCOPIC RETROGRADE CHOLANGIOPANCREATOGRAPHY (ERCP) SPHINCTEROTOMY REMOVAL OF STONES     Patient location during evaluation: Endoscopy Anesthesia Type: General Level of consciousness: awake and alert Pain management: pain level controlled Vital Signs Assessment: post-procedure vital signs reviewed and stable Respiratory status: spontaneous breathing, nonlabored ventilation, respiratory function stable and patient connected to nasal cannula oxygen Cardiovascular status: blood pressure returned to baseline and stable Postop Assessment: no apparent nausea or vomiting Anesthetic complications: no  No notable events documented.  Last Vitals:  Vitals:   11/23/21 1450 11/23/21 1516  BP: (!) 124/56 113/66  Pulse: 96 98  Resp: (!) 23 18  Temp:  36.5 C  SpO2: 92%     Last Pain:  Vitals:   11/23/21 1516  TempSrc: Oral  PainSc:                  Barnet Glasgow

## 2021-11-24 ENCOUNTER — Inpatient Hospital Stay (HOSPITAL_COMMUNITY): Payer: Medicare Other | Admitting: Certified Registered Nurse Anesthetist

## 2021-11-24 ENCOUNTER — Encounter (HOSPITAL_COMMUNITY)
Admission: EM | Disposition: A | Discharge status: Home / Self Care | Payer: Self-pay | Source: Home / Self Care | Attending: Internal Medicine

## 2021-11-24 ENCOUNTER — Other Ambulatory Visit: Payer: Self-pay

## 2021-11-24 ENCOUNTER — Encounter (HOSPITAL_COMMUNITY): Payer: Self-pay | Admitting: Internal Medicine

## 2021-11-24 DIAGNOSIS — I4891 Unspecified atrial fibrillation: Secondary | ICD-10-CM | POA: Diagnosis not present

## 2021-11-24 DIAGNOSIS — K8062 Calculus of gallbladder and bile duct with acute cholecystitis without obstruction: Secondary | ICD-10-CM | POA: Diagnosis not present

## 2021-11-24 DIAGNOSIS — M1990 Unspecified osteoarthritis, unspecified site: Secondary | ICD-10-CM | POA: Diagnosis not present

## 2021-11-24 DIAGNOSIS — K810 Acute cholecystitis: Secondary | ICD-10-CM | POA: Diagnosis not present

## 2021-11-24 DIAGNOSIS — I341 Nonrheumatic mitral (valve) prolapse: Secondary | ICD-10-CM

## 2021-11-24 HISTORY — PX: CHOLECYSTECTOMY: SHX55

## 2021-11-24 LAB — BASIC METABOLIC PANEL
Anion gap: 7 (ref 5–15)
BUN: 17 mg/dL (ref 8–23)
CO2: 19 mmol/L — ABNORMAL LOW (ref 22–32)
Calcium: 7.3 mg/dL — ABNORMAL LOW (ref 8.9–10.3)
Chloride: 112 mmol/L — ABNORMAL HIGH (ref 98–111)
Creatinine, Ser: 0.7 mg/dL (ref 0.44–1.00)
GFR, Estimated: >60 mL/min (ref >60)
Glucose, Bld: 103 mg/dL — ABNORMAL HIGH (ref 70–99)
Potassium: 3.2 mmol/L — ABNORMAL LOW (ref 3.5–5.1)
Sodium: 138 mmol/L (ref 135–145)

## 2021-11-24 LAB — LIPASE, BLOOD: Lipase: 25 U/L (ref 11–51)

## 2021-11-24 LAB — HEPATIC FUNCTION PANEL
ALT: 21 U/L (ref 0–44)
AST: 30 U/L (ref 15–41)
Albumin: 2.1 g/dL — ABNORMAL LOW (ref 3.5–5.0)
Alkaline Phosphatase: 121 U/L (ref 38–126)
Bilirubin, Direct: 0.1 mg/dL (ref 0.0–0.2)
Indirect Bilirubin: 0.4 mg/dL (ref 0.3–0.9)
Total Bilirubin: 0.5 mg/dL (ref 0.3–1.2)
Total Protein: 4.8 g/dL — ABNORMAL LOW (ref 6.5–8.1)

## 2021-11-24 LAB — CBC
HCT: 40.5 % (ref 36.0–46.0)
Hemoglobin: 12.9 g/dL (ref 12.0–15.0)
MCH: 31.1 pg (ref 26.0–34.0)
MCHC: 31.9 g/dL (ref 30.0–36.0)
MCV: 97.6 fL (ref 80.0–100.0)
Platelets: 192 10*3/uL (ref 150–400)
RBC: 4.15 MIL/uL (ref 3.87–5.11)
RDW: 14.2 % (ref 11.5–15.5)
WBC: 13.3 10*3/uL — ABNORMAL HIGH (ref 4.0–10.5)
nRBC: 0 % (ref 0.0–0.2)

## 2021-11-24 LAB — MAGNESIUM: Magnesium: 2 mg/dL (ref 1.7–2.4)

## 2021-11-24 SURGERY — LAPAROSCOPIC CHOLECYSTECTOMY
Anesthesia: General

## 2021-11-24 MED ORDER — POTASSIUM CHLORIDE 10 MEQ/100ML IV SOLN
10.0000 meq | INTRAVENOUS | Status: AC
  Administered 2021-11-24 (×3): 10 meq via INTRAVENOUS
  Filled 2021-11-24 (×3): qty 100

## 2021-11-24 MED ORDER — ACETAMINOPHEN 325 MG PO TABS
650.0000 mg | ORAL_TABLET | Freq: Four times a day (QID) | ORAL | Status: DC
  Administered 2021-11-24 – 2021-11-25 (×4): 650 mg via ORAL
  Filled 2021-11-24 (×4): qty 2

## 2021-11-24 MED ORDER — DEXAMETHASONE SODIUM PHOSPHATE 4 MG/ML IJ SOLN
INTRAMUSCULAR | Status: DC | PRN
  Administered 2021-11-24: 4 mg via INTRAVENOUS

## 2021-11-24 MED ORDER — LIDOCAINE HCL (PF) 2 % IJ SOLN
INTRAMUSCULAR | Status: AC
  Filled 2021-11-24: qty 5

## 2021-11-24 MED ORDER — ROCURONIUM BROMIDE 10 MG/ML (PF) SYRINGE
PREFILLED_SYRINGE | INTRAVENOUS | Status: AC
  Filled 2021-11-24: qty 10

## 2021-11-24 MED ORDER — PROPOFOL 10 MG/ML IV BOLUS
INTRAVENOUS | Status: DC | PRN
  Administered 2021-11-24: 90 mg via INTRAVENOUS

## 2021-11-24 MED ORDER — METRONIDAZOLE 500 MG/100ML IV SOLN
INTRAVENOUS | Status: AC
  Filled 2021-11-24: qty 100

## 2021-11-24 MED ORDER — SUGAMMADEX SODIUM 200 MG/2ML IV SOLN
INTRAVENOUS | Status: DC | PRN
  Administered 2021-11-24: 200 mg via INTRAVENOUS

## 2021-11-24 MED ORDER — LIDOCAINE 2% (20 MG/ML) 5 ML SYRINGE
INTRAMUSCULAR | Status: DC | PRN
  Administered 2021-11-24: 50 mg via INTRAVENOUS

## 2021-11-24 MED ORDER — FENTANYL CITRATE (PF) 100 MCG/2ML IJ SOLN
INTRAMUSCULAR | Status: DC | PRN
  Administered 2021-11-24: 50 ug via INTRAVENOUS

## 2021-11-24 MED ORDER — BUPIVACAINE-EPINEPHRINE (PF) 0.5% -1:200000 IJ SOLN
INTRAMUSCULAR | Status: AC
  Filled 2021-11-24: qty 30

## 2021-11-24 MED ORDER — CHLORHEXIDINE GLUCONATE 0.12 % MT SOLN
15.0000 mL | Freq: Once | OROMUCOSAL | Status: AC
  Administered 2021-11-24: 15 mL via OROMUCOSAL

## 2021-11-24 MED ORDER — ONDANSETRON HCL 4 MG/2ML IJ SOLN
INTRAMUSCULAR | Status: DC | PRN
  Administered 2021-11-24: 4 mg via INTRAVENOUS

## 2021-11-24 MED ORDER — LACTATED RINGERS IV SOLN: INTRAVENOUS | Status: DC

## 2021-11-24 MED ORDER — LACTATED RINGERS IR SOLN
Status: DC | PRN
  Administered 2021-11-24: 1000 mL

## 2021-11-24 MED ORDER — 0.9 % SODIUM CHLORIDE (POUR BTL) OPTIME
TOPICAL | Status: DC | PRN
  Administered 2021-11-24: 1000 mL

## 2021-11-24 MED ORDER — AMISULPRIDE (ANTIEMETIC) 5 MG/2ML IV SOLN: 10.0000 mg | Freq: Once | INTRAVENOUS | Status: DC | PRN

## 2021-11-24 MED ORDER — FENTANYL CITRATE (PF) 100 MCG/2ML IJ SOLN
INTRAMUSCULAR | Status: AC
  Filled 2021-11-24: qty 2

## 2021-11-24 MED ORDER — PHENYLEPHRINE HCL-NACL 20-0.9 MG/250ML-% IV SOLN
INTRAVENOUS | Status: DC | PRN
  Administered 2021-11-24: 80 ug/min via INTRAVENOUS

## 2021-11-24 MED ORDER — FENTANYL CITRATE PF 50 MCG/ML IJ SOSY: 25.0000 ug | PREFILLED_SYRINGE | INTRAMUSCULAR | Status: DC | PRN

## 2021-11-24 MED ORDER — PROPOFOL 10 MG/ML IV BOLUS
INTRAVENOUS | Status: AC
  Filled 2021-11-24: qty 20

## 2021-11-24 MED ORDER — ROCURONIUM BROMIDE 10 MG/ML (PF) SYRINGE
PREFILLED_SYRINGE | INTRAVENOUS | Status: DC | PRN
  Administered 2021-11-24: 50 mg via INTRAVENOUS

## 2021-11-24 MED ORDER — METOPROLOL TARTRATE 25 MG PO TABS
12.5000 mg | ORAL_TABLET | Freq: Two times a day (BID) | ORAL | Status: DC
  Administered 2021-11-24 – 2021-11-25 (×3): 12.5 mg via ORAL
  Filled 2021-11-24 (×3): qty 1

## 2021-11-24 MED ORDER — DEXMEDETOMIDINE HCL IN NACL 80 MCG/20ML IV SOLN
INTRAVENOUS | Status: DC | PRN
  Administered 2021-11-24: 4 ug via BUCCAL

## 2021-11-24 SURGICAL SUPPLY — 35 items
APPLIER CLIP ROT 10 11.4 M/L (STAPLE) ×1
BAG COUNTER SPONGE SURGICOUNT (BAG)
CABLE HIGH FREQUENCY MONO STRZ (ELECTRODE) ×1
CHLORAPREP W/TINT 26 (MISCELLANEOUS) ×1
COVER MAYO STAND STRL (DRAPES)
COVER SURGICAL LIGHT HANDLE (MISCELLANEOUS) ×1
DERMABOND ADVANCED .7 DNX12 (GAUZE/BANDAGES/DRESSINGS) ×1
DRAPE C-ARM 42X120 X-RAY (DRAPES)
ELECT REM PT RETURN 15FT ADLT (MISCELLANEOUS) ×1
GLOVE SURG ORTHO 8.0 STRL STRW (GLOVE) ×1
GLOVE SURG SYN 7.5  E (GLOVE) ×1
GLOVE SURG SYN 7.5 E (GLOVE) ×1
GOWN STRL REUS W/ TWL XL LVL3 (GOWN DISPOSABLE) ×2
GOWN STRL REUS W/TWL XL LVL3 (GOWN DISPOSABLE) ×2
HEMOSTAT SURGICEL 4X8 (HEMOSTASIS)
IRRIG SUCT STRYKERFLOW 2 WTIP (MISCELLANEOUS) ×1
KIT BASIN OR (CUSTOM PROCEDURE TRAY) ×1
KIT TURNOVER KIT A (KITS)
PAD POSITIONING PINK XL (MISCELLANEOUS)
PENCIL SMOKE EVACUATOR (MISCELLANEOUS)
SCISSORS LAP 5X35 DISP (ENDOMECHANICALS) ×1
SET CHOLANGIOGRAPH MIX (MISCELLANEOUS) ×1
SET TUBE SMOKE EVAC HIGH FLOW (TUBING) ×1
SLEEVE Z-THREAD 5X100MM (TROCAR) ×1
SPIKE FLUID TRANSFER (MISCELLANEOUS) ×1
STRIP CLOSURE SKIN 1/2X4 (GAUZE/BANDAGES/DRESSINGS) ×1
SUT MNCRL AB 4-0 PS2 18 (SUTURE) ×1
SUT VIC AB 4-0 PS2 27 (SUTURE) ×1
SYS BAG RETRIEVAL 10MM (BASKET) ×1
TAPE CLOTH 4X10 WHT NS (GAUZE/BANDAGES/DRESSINGS)
TOWEL OR 17X26 10 PK STRL BLUE (TOWEL DISPOSABLE) ×1
TRAY LAPAROSCOPIC (CUSTOM PROCEDURE TRAY) ×1
TROCAR 11X100 Z THREAD (TROCAR) ×1
TROCAR BALLN 12MMX100 BLUNT (TROCAR) ×1
TROCAR Z-THREAD OPTICAL 5X100M (TROCAR) ×1

## 2021-11-24 NOTE — Progress Notes (Signed)
PROGRESS NOTE  Lisa Lang  LJQ:[id number] DOB: 1927/07/06 DOA: 11/20/2021 PCP: Lavone Orn, MD   Brief Narrative:  Patient is a 88 year old female with history of paroxysmal A-fib on Eliquis, hyperlipidemia who presented with right upper quadrant abdominal discomfort, nausea, vomiting, subjective fevers at home.  On presentation, she was hemodynamically stable.  Lab work showed creatinine of 1.06, bilirubin of 1.7, normal liver enzymes, lipase of 25, WBC count of 21.3.  CT abdomen/pelvis showed dilated gallbladder up to 11 cm with associated gallbladder thickening, pericholecystic fluid consistent with acute cholecystitis, also showed stone clusters in the gallbladder fundus as well as evidence of intrahepatic/extrahepatic biliary dilatation with the common bile duct of 10 mm in addition to showing evidence of radiopaque choledocholithiasis.  MRCP confirmed stone in the cystic duct, choledocholithiasis.  Patient has been started on antibiotics.  GI, general surgery consulted. S/p  ERCPon 11/20 ,plan for  cholecystectomy today  Assessment & Plan:  Principal Problem:   Acute cholecystitis Active Problems:   Paroxysmal atrial fibrillation (HCC)   HLD (hyperlipidemia)   Choledocholithiasis   Sepsis ([place])   Dehydration   Acute cystitis   Acute cholecystitis: Presented with abdominal pain, subjective fever, nausea, vomiting.  Imaging as above.  Currently on ceftriaxone, Flagyl.  Follow-up cultures.NGTD General surgery and GI following.  Status post ERCP on 11/28 with biliary sphincterectomy, removal of stones.   Plan for cholecystectomy today. Leukocytosis improved.  She denies any abdominal discomfort today.  Sepsis: Met sepsis criteria on admission with leukocytosis, tachypnea.  Currently hemodynamically stable.  Cultures have been sent, so far negative.  Urine culture showed 20,000 colonies of E. coli which is pansensitive.  Patient has no urinary symptoms.  Continue current antibiotics  for now  Paroxysmal A-fib: Currently in normal sinus rhythm.  On Eliquis for anticoagulation, which is on hold.  On metoprolol for rate control at home.  Had DCCV for A-fib on 11/06/2021 .   Cardiology started on IV amiodarone to prevent A-fib.  Eliquis will be resumed after surgery  MVP: Noted on recent echo.  Management as per cardiology.  Hyperlipidemia: On Lipitor at home  Hypokalemia: Supplemented         DVT prophylaxis:SCDs Start: 11/21/21 0027     Code Status: Full Code  Family Communication: Daughter at bedside on 11/20  Patient status:Inpatient  Patient is from :Home  Anticipated discharge QR:[id number]  Estimated DC date:1-2 days   Consultants: GI,Surgery  Procedures:MRCP,ERCP  Antimicrobials:  Anti-infectives (From admission, onward)    Start     Dose/Rate Route Frequency Ordered Stop   11/21/21 2200  cefTRIAXone (ROCEPHIN) 2 g in sodium chloride 0.9 % 100 mL IVPB        2 g 200 mL/hr over 30 Minutes Intravenous Every 24 hours 11/21/21 0029     11/21/21 0030  metroNIDAZOLE (FLAGYL) IVPB 500 mg        500 mg 100 mL/hr over 60 Minutes Intravenous Every 12 hours 11/21/21 0029     11/20/21 2230  cefTRIAXone (ROCEPHIN) 1 g in sodium chloride 0.9 % 100 mL IVPB        1 g 200 mL/hr over 30 Minutes Intravenous  Once 11/20/21 2218 11/20/21 2331       Subjective: Patient seen and examined at bedside today.  Appears very comfortable.  Her right upper quadrant discomfort has resolved after ERCP.  Waiting for cholecystectomy  Objective: Vitals:   11/22/21 2051 11/23/21 0118 11/23/21 0512 11/23/21 0934  BP: (!) 128/50 121/62 119/72 [name] Kitchen)  146/68  Pulse: 86 75 78 (!) 106  Resp: 16 (!) _0 Temp: 99.1 F (37.3 C) 98.2 F (36.8 C) 98.6 F (37 C) 98.1 F (36.7 C)  TempSrc: Oral Oral Oral Oral  SpO2: 95% 93% 92% 98%  Weight:   54 kg     Intake/Output Summary (Last 24 hours) at 11/23/2021 1128 Last data filed at 11/23/2021 2426 Gross per 24 hour  Intake  1375.95 ml  Output --  Net 1375.95 ml   Filed Weights   11/22/21 0500 11/23/21 0512  Weight: 49.9 kg 54 kg    Examination:  General exam: Overall comfortable, not in distress,pleasant elderly female HEENT: PERRL Respiratory system:  no wheezes or crackles  Cardiovascular system: S1 & S2 heard, RRR.  Gastrointestinal system: Abdomen is nondistended, soft and nontender. Central nervous system: Alert and oriented Extremities: No edema, no clubbing ,no cyanosis Skin: No rashes, no ulcers,no icterus      Data Reviewed: I have personally reviewed following labs and imaging studies  CBC: Recent Labs  Lab 11/20/21 2054 11/21/21 0434 11/22/21 0413 11/23/21 0538  WBC 21.3* 21.4* 19.6* 20.2*  NEUTROABS 17.4* 18.9* 17.4*  --   HGB 14.8 13.1 12.3 14.3  HCT 46.1* 41.4 38.8 45.0  MCV 97.1 97.9 99.2 97.6  PLT 195 182 149* 834   Basic Metabolic Panel: Recent Labs  Lab 11/20/21 2054 11/21/21 0434 11/22/21 0413 11/23/21 0538  NA 137 140 139 138  K 4.0 3.8 3.3* 3.6  CL 100 106 111 110  CO2 _1 GLUCOSE 137* 125* 119* 111*  BUN 27* 26* 25* 18  CREATININE 1.06* 0.95 0.82 0.80  CALCIUM 9.3 8.5* 7.6* 7.9*  MG  --  1.9 1.9 2.0     Recent Results (from the past 240 hour(s))  Urine Culture     Status: Abnormal   Collection Time: 11/20/21  8:24 PM   Specimen: Urine, Clean Catch  Result Value Ref Range Status   Specimen Description   Final    URINE, CLEAN CATCH Performed at [facility], [place] [address], [place], [place] [address]    Special Requests   Final    NONE Performed at [facility], [place] [address], [place], [place] [address]    Culture (A)  Final    20,000 COLONIES/mL ESCHERICHIA COLI CRITICAL RESULT CALLED TO, READ BACK BY AND VERIFIED WITH: PHARMD G GADHIA [id number] AT 728 AM BY CM Performed at [facility], [place] [address], [place], [place] [address]    Report Status 11/23/2021 FINAL  Final   Organism ID,  Bacteria ESCHERICHIA COLI (A)  Final      Susceptibility   Escherichia coli - MIC*    AMPICILLIN <=2 SENSITIVE Sensitive     CEFAZOLIN <=4 SENSITIVE Sensitive     CEFEPIME <=0.12 SENSITIVE Sensitive     CEFTRIAXONE <=0.25 SENSITIVE Sensitive     CIPROFLOXACIN <=0.25 SENSITIVE Sensitive     GENTAMICIN <=1 SENSITIVE Sensitive     IMIPENEM <=0.25 SENSITIVE Sensitive     NITROFURANTOIN <=16 SENSITIVE Sensitive     TRIMETH/SULFA <=20 SENSITIVE Sensitive     AMPICILLIN/SULBACTAM <=2 SENSITIVE Sensitive     PIP/TAZO <=4 SENSITIVE Sensitive     * 20,000 COLONIES/mL ESCHERICHIA COLI  Surgical PCR screen     Status: None   Collection Time: 11/23/21  5:49 AM   Specimen: Nasal Mucosa; Nasal Swab  Result Value Ref Range Status   MRSA,  PCR NEGATIVE NEGATIVE Final   Staphylococcus aureus NEGATIVE NEGATIVE Final    Comment: (NOTE) The Xpert SA Assay (FDA approved for NASAL specimens in patients 4 years of age and older), is one component of a comprehensive surveillance program. It is not intended to diagnose infection nor to guide or monitor treatment. Performed at [facility], [place] [address] [place], [place] [address]      Radiology Studies: No results found.  Scheduled Meds:  diclofenac  100 mg Rectal Once   feeding supplement  1 Container Oral TID BM   mupirocin ointment  1 Application Nasal BID   Continuous Infusions:  sodium chloride 75 mL/hr at 11/22/21 2053   sodium chloride     amiodarone 30 mg/hr (11/23/21 0102)   cefTRIAXone (ROCEPHIN)  IV 2 g (11/22/21 2054)   metronidazole 500 mg (11/23/21 0107)     LOS: 2 days   Shelly Coss, MD [facility] P11/20/2023, 11:28 AM

## 2021-11-24 NOTE — Op Note (Signed)
Procedure Note  Pre-operative Diagnosis:  acute cholecystitis, cholelithiasis, choledocholithiasis  Post-operative Diagnosis:  same  Surgeon:  Armandina Gemma, MD  Assistant:  Obie Dredge, PA-C   Procedure:  Laparoscopic cholecystectomy  Anesthesia:  General  Estimated Blood Loss:  minimal  Drains: none         Specimen: gallbladder to pathology  Indications:  Patient is a 88 yo female with acute cholecystitis, cholelithiasis.  Patient underwent successful ERCP with stone extraction from the CBD yesterday.  Now she returns to the OR for cholecystectomy.  Procedure description: The patient was seen in the pre-op holding area. The risks, benefits, complications, treatment options, and expected outcomes were previously discussed with the patient. The patient agreed with the proposed plan and has signed the informed consent form.  The patient was transported to operating room # 4 at the [facility]. The patient was placed in the supine position on the operating room table. Following induction of general anesthesia, the abdomen was prepped and draped in the usual aseptic fashion.  An incision was made in the skin near the umbilicus. The midline fascia was incised and the peritoneal cavity was entered and a Hasson cannula was introduced under direct vision. The cannula was secured with a 0-Vicryl pursestring suture. Pneumoperitoneum was established with carbon dioxide. Additional cannulae were introduced under direct vision along the right costal margin in the midline, mid-clavicular line, and anterior axillary line.   The gallbladder was identified and the fundus grasped and retracted cephalad. Adhesions were taken down bluntly and the electrocautery was utilized as needed, taking care not to involve any adjacent structures. The infundibulum was grasped and retracted laterally, exposing the peritoneum overlying the triangle of Calot. The peritoneum was incised and structures exposed  with blunt dissection. The cystic duct was then ligated with ligaclips and divided. The cystic artery was identified, dissected circumferentially, ligated with ligaclips, and divided.  The gallbladder was dissected away from the gallbladder bed using the electrocautery for hemostasis. The gallbladder was completely removed from the liver and placed into an endocatch bag. The gallbladder was removed in the endocatch bag through the umbilical port site and submitted to pathology for review.  The right upper quadrant was irrigated and the gallbladder bed was inspected. Hemostasis was achieved with the electrocautery.  Cannulae were removed under direct vision and good hemostasis was noted. Pneumoperitoneum was released and the majority of the carbon dioxide evacuated. The umbilical wound was irrigated and the fascia was then closed with the pursestring suture.  Local anesthetic was infiltrated at all port sites. Skin incisions were closed with 4-0 Monocril subcuticular sutures and Dermabond was applied.  Instrument, sponge, and needle counts were correct at the conclusion of the case.  The patient was awakened from anesthesia and brought to the recovery room in stable condition.  The patient tolerated the procedure well.   Armandina Gemma, [facility] [facility] Office: [phone]

## 2021-11-24 NOTE — Progress Notes (Cosign Needed Addendum)
Rounding Note    Patient Name: Lisa Lang Date of Encounter: 11/24/2021  [place] Cardiologist: Glenetta Hew, MD   Subjective   She states she feels much improved today with adequate pain control, going to have surgery this PM. She denied chest pain, hx of CVA, and states she never really feels symptoms with her A fib.   Inpatient Medications    Scheduled Meds:  diclofenac  100 mg Rectal Once   feeding supplement  1 Container Oral TID BM   mupirocin ointment  1 Application Nasal BID   Continuous Infusions:  sodium chloride 75 mL/hr at 11/24/21 0726   amiodarone 30 mg/hr (11/23/21 2143)   cefTRIAXone (ROCEPHIN)  IV 2 g (11/23/21 2148)   metronidazole 500 mg (11/24/21 0238)   potassium chloride     PRN Meds: acetaminophen **OR** acetaminophen, HYDROmorphone (DILAUDID) injection, naLOXone (NARCAN)  injection, ondansetron (ZOFRAN) IV, oxyCODONE   Vital Signs    Vitals:   11/23/21 1450 11/23/21 1516 11/23/21 2155 11/24/21 0431  BP: (!) 124/56 113/66 104/66 102/63  Pulse: 96 98 (!) 118 93  Resp: (!) 23 18 (!) 24 18  Temp:  97.7 F (36.5 C) 98.2 F (36.8 C) 98.3 F (36.8 C)  TempSrc:  Oral Oral Oral  SpO2: 92%  95% 92%  Weight:      Height:        Intake/Output Summary (Last 24 hours) at 11/24/2021 0851 Last data filed at 11/24/2021 0432 Gross per 24 hour  Intake 1957.39 ml  Output 0 ml  Net 1957.39 ml      11/23/2021   12:49 PM 11/23/2021    5:12 AM 11/22/2021    5:00 AM  Last 3 Weights  Weight (lbs) 119 lb 0.8 oz 119 lb 0.8 oz 110 lb  Weight (kg) 54 kg 54 kg 49.896 kg      Telemetry    Off telemetry from 1243 to 3pm 11/23/21, A fib with ventricular rate of 90-120s, currently 100s - Personally Reviewed  ECG    N/A today - Personally Reviewed  Physical Exam   GEN: No acute distress.   Neck: No JVD Cardiac: Irregularly irregular, soft murmur noted  Respiratory: Clear to auscultation bilaterally. On room air  GI: Soft,  nontender on light palpation  MS: No leg edema; No deformity. Neuro:  Nonfocal  Psych: Normal affect   Labs    High Sensitivity Troponin:  No results for input(s): "TROPONINIHS" in the last 720 hours.   Chemistry Recent Labs  Lab 11/21/21 0434 11/22/21 0413 11/23/21 0538 11/24/21 0450  NA 140 139 138 138  K 3.8 3.3* 3.6 3.2*  CL 106 111 110 112*  CO2 '26 23 23 '$ 19*  GLUCOSE 125* 119* 111* 103*  BUN 26* 25* 18 17  CREATININE 0.95 0.82 0.80 0.70  CALCIUM 8.5* 7.6* 7.9* 7.3*  MG 1.9 1.9 2.0  --   PROT 6.1* 5.3* 5.9*  --   ALBUMIN 3.0* 2.5* 2.9*  --   AST 33 34 31  --   ALT '26 26 27  '$ --   ALKPHOS 91 85 107  --   BILITOT 1.4* 0.9 0.6  --   GFRNONAA 56* >60 >60 >60  ANIONGAP '8 5 5 7    '$ Lipids No results for input(s): "CHOL", "TRIG", "HDL", "LABVLDL", "LDLCALC", "CHOLHDL" in the last 168 hours.  Hematology Recent Labs  Lab 11/22/21 0413 11/23/21 0538 11/24/21 0450  WBC 19.6* 20.2* 13.3*  RBC 3.91 4.61 4.15  HGB 12.3 14.3 12.9  HCT 38.8 45.0 40.5  MCV 99.2 97.6 97.6  MCH 31.5 31.0 31.1  MCHC 31.7 31.8 31.9  RDW 13.9 14.1 14.2  PLT 149* 183 192   Thyroid No results for input(s): "TSH", "FREET4" in the last 168 hours.  BNPNo results for input(s): "BNP", "PROBNP" in the last 168 hours.  DDimer No results for input(s): "DDIMER" in the last 168 hours.   Radiology    DG ERCP  Result Date: 11/23/2021 CLINICAL DATA:  Choledocholithiasis EXAM: ERCP TECHNIQUE: Multiple spot images obtained with the fluoroscopic device and submitted for interpretation post-procedure. FLUOROSCOPY: Radiation Exposure Index (as provided by the fluoroscopic device): 25.7 mGy Kerma COMPARISON:  MRCP 11/21/2021 FINDINGS: A total of 9 intraoperative spot images are submitted for review. The images demonstrate a flexible duodenal scope in the descending duodenum with wire cannulation of the common bile duct and cholangiography. The bile duct is diffusely dilated. Filling defects present within the  common bile duct and cystic duct consistent with choledocholithiasis. Subsequent images confirm sphincterotomy and balloon sweeping of the duct. Small amount of contrast material is present within the gallbladder. IMPRESSION: 1. Choledocholithiasis with stones in the cystic duct as well. 2. Biliary ductal dilatation. 3. ERCP with sphincterotomy and balloon sweeping of the common duct. These images were submitted for radiologic interpretation only. Please see the procedural report for the amount of contrast and the fluoroscopy time utilized. Electronically Signed   By: Jacqulynn Cadet M.D.   On: 11/23/2021 14:44    Cardiac Studies   TEE from 10/28/21:    1. Left ventricular ejection fraction, by estimation, is 65 to 70%. The  left ventricle has normal function.   2. Right ventricular systolic function is normal. The right ventricular  size is normal.   3. Left atrial size was moderately dilated. No left atrial/left atrial  appendage thrombus was detected.   4. Right atrial size was moderately dilated.   5. The mitral valve is myxomatous with a large overriding anterior mitral  valve leaflet and a diminutive posterior leaflet that measures about 8-39m  in length. There is bileaflet prolapse with a larger posteriorly directed  jet and a smaller anteriorly  directed jet. Overall, mitral regurgitation appears moderate (EROA of  posterior jet 0.2cm2, RVol 260m. There is no systolic flow reversal in  the pulmonary veins. PASP is 2482m +RAP. . TMarland Kitchene mitral valve is  myxomatous. Moderate mitral valve regurgitation.   6. Tricuspid valve regurgitation is mild to moderate.   7. The aortic valve is tricuspid. There is mild calcification of the  aortic valve. There is mild thickening of the aortic valve. Aortic valve  regurgitation is mild. Aortic valve sclerosis/calcification is present,  without any evidence of aortic  stenosis.   8. Following TEE, the patient underwent successful DCCV with 150J x1.     TTE from 10/25/2021:   1. Left ventricular ejection fraction, by estimation, is 65 to 70%. The  left ventricle has normal function. The left ventricle has no regional  wall motion abnormalities. Left ventricular diastolic function could not  be evaluated.   2. Right ventricular systolic function is normal. The right ventricular  size is normal. There is moderately elevated pulmonary artery systolic  pressure. The estimated right ventricular systolic pressure is 57.70.6Hg.   3. Left atrial size was moderately dilated.   4. Right atrial size was moderately dilated.   5. 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 The posterior  leaflet  is dimunitive and there is at least moderate posterior MR. The  mitral valve is myxomatous. Moderate mitral valve regurgitation. There is  severe prolapse of both leaflets  of the mitral valve. Moderate mitral annular calcification.   6. Tricuspid valve regurgitation is moderate.   7. The aortic valve is tricuspid. There is moderate calcification of the  aortic valve. Aortic valve regurgitation is mild to moderate. Aortic valve  sclerosis/calcification is present, without any evidence of aortic  stenosis.   Patient Profile     88 y.o. 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 Cardiology was consulted for pre-op evaluation.   Assessment & Plan    Paroxysmal A fib - hx of DCCV 10/28/21 and 11/06/21  - A fib with intermittent RVR now, rate is at 100s currently, on IV amiodarone, can repeat bolus if RVR occurs  - will resume PTA low dose metoprolol  12.5 BID for rate control today  - CHADS2VASc 4 due to age, gender, CHF, anticoagulation with Eliquis is on hold currently due to pending surgery, would resume as soon as cleared by surgery team  - Keep K >4 and Mag >2, check Mag today, K is 3.2,  replace per primary team   Acute cholecystits  - RCRI is 1, 0.9 % perioperative risk of major cardiac events - clinically compensated, no absolute contraindication for low risk surgery planned  - resume home med metoprolol today, would avoid abrupt discontinuation pre-op   Heart failure due to valvular disease  Moderate to severe mitral regurgitation  MVP - clinically compensated, EF preserved on recent Echo, consider PRN lasix if signs of hypervolemia, outpatient follow up with cardiology      For questions or updates, please contact [place] Please consult [email or web address] for contact info under        Signed, Margie Billet, NP  11/24/2021, 8:51 AM

## 2021-11-24 NOTE — Care Management Important Message (Signed)
Important Message  Patient Details IM Letter given. Name: Lisa Lang MRN: [id number] Date of Birth: [birth year]   Medicare Important Message Given:  Yes     Kerin Salen 11/24/2021, 11:01 AM

## 2021-11-24 NOTE — Anesthesia Postprocedure Evaluation (Signed)
Anesthesia Post Note  Patient: Lisa Lang  Procedure(s) Performed: LAPAROSCOPIC CHOLECYSTECTOMY     Patient location during evaluation: PACU Anesthesia Type: General Level of consciousness: awake and alert Pain management: pain level controlled Vital Signs Assessment: post-procedure vital signs reviewed and stable Respiratory status: spontaneous breathing, nonlabored ventilation, respiratory function stable and patient connected to nasal cannula oxygen Cardiovascular status: blood pressure returned to baseline and stable Postop Assessment: no apparent nausea or vomiting Anesthetic complications: no   No notable events documented.  Last Vitals:  Vitals:   11/24/21 1715 11/24/21 1738  BP: (!) 112/53 (!) 136/56  Pulse: 67 74  Resp: (!) 26 20  Temp:  36.6 C  SpO2: 94% 93%    Last Pain:  Vitals:   11/24/21 1738  TempSrc: Oral  PainSc:                  [name]

## 2021-11-24 NOTE — Progress Notes (Signed)
  Transition of Care [facility]) Screening Note   Patient Details  Name: Lisa Lang Date of Birth: 02-17-1927   Transition of Care [facility]) CM/SW Contact:    Dessa Phi, RN Phone Number: 11/24/2021, 2:40 PM    Transition of Care Department [facility]) has reviewed patient and no TOC needs have been identified at this time. We will continue to monitor patient advancement through interdisciplinary progression rounds. If new patient transition needs arise, please place a TOC consult.

## 2021-11-24 NOTE — Progress Notes (Signed)
Subjective: Abdominal pain much less. Doing well after ERCP.  Objective: Vital signs in last 24 hours: Temp:  [97.7 F (36.5 C)-98.3 F (36.8 C)] 98.3 F (36.8 C) (11/21 0431) Pulse Rate:  [79-118] 93 (11/21 0431) Resp:  [17-25] 18 (11/21 0431) BP: (102-148)/(38-66) 102/63 (11/21 0431) SpO2:  [91 %-100 %] 92 % (11/21 0431) Weight:  [54 kg] 54 kg (11/20 1249) Weight change: 0 kg Last BM Date : 11/16/21  PE: GEN:  NAD ABD:  Soft, non-tender  Lab Results: CBC    Component Value Date/Time   WBC 13.3 (H) 11/24/2021 0450   RBC 4.15 11/24/2021 0450   HGB 12.9 11/24/2021 0450   HCT 40.5 11/24/2021 0450   PLT 192 11/24/2021 0450   MCV 97.6 11/24/2021 0450   MCH 31.1 11/24/2021 0450   MCHC 31.9 11/24/2021 0450   RDW 14.2 11/24/2021 0450   LYMPHSABS 0.4 (L) 11/22/2021 0413   MONOABS 1.5 (H) 11/22/2021 0413   EOSABS 0.0 11/22/2021 0413   BASOSABS 0.0 11/22/2021 0413  CMP     Component Value Date/Time   NA 138 11/24/2021 0450   K 3.2 (L) 11/24/2021 0450   CL 112 (H) 11/24/2021 0450   CO2 19 (L) 11/24/2021 0450   GLUCOSE 103 (H) 11/24/2021 0450   BUN 17 11/24/2021 0450   CREATININE 0.70 11/24/2021 0450   CALCIUM 7.3 (L) 11/24/2021 0450   PROT 4.8 (L) 11/24/2021 0450   ALBUMIN 2.1 (L) 11/24/2021 0450   AST 30 11/24/2021 0450   ALT 21 11/24/2021 0450   ALKPHOS 121 11/24/2021 0450   BILITOT 0.5 11/24/2021 0450   GFRNONAA >60 11/24/2021 0450   GFRAA 57 (L) 09/28/2017 0413   Assessment:  Acute cholecystitis.  This is principal source of patient's pain and leukocytosis and fevers. CBD stones s/p ERCP tomorrow with biliary sphincterotomy and stone removal.  LFTs nearly normal. Chronic anticoagulation, Atrial fibrillation, apixaban, last dose yesterday.  Plan:   Doing well after ERCP.  Plans for gallstones and cholecystitis per surgical team. [facility] will sign-off; please call with questions; thank for the consultation.   Lisa Lang 11/24/2021, 9:52  AM   Cell [phone] If no answer or after 5 PM call [phone]

## 2021-11-24 NOTE — Anesthesia Procedure Notes (Signed)
Procedure Name: Intubation Date/Time: 11/24/2021 2:39 PM  Performed by: Lavina Hamman, CRNAPre-anesthesia Checklist: Patient identified, Emergency Drugs available, Suction available, Patient being monitored and Timeout performed Patient Re-evaluated:Patient Re-evaluated prior to induction Oxygen Delivery Method: Circle system utilized Preoxygenation: Pre-oxygenation with 100% oxygen Induction Type: IV induction Ventilation: Mask ventilation without difficulty Laryngoscope Size: Mac and 3 Grade View: Grade II Tube type: Oral Tube size: 7.0 mm Number of attempts: 1 Airway Equipment and Method: Stylet Placement Confirmation: ETT inserted through vocal cords under direct vision, positive ETCO2, CO2 detector and breath sounds checked- equal and bilateral Secured at: 22 cm Tube secured with: Tape Dental Injury: Teeth and Oropharynx as per pre-operative assessment  Comments: ATOI

## 2021-11-24 NOTE — Anesthesia Preprocedure Evaluation (Signed)
Anesthesia Evaluation  Patient identified by MRN, date of birth, ID band Patient awake    Reviewed: Allergy & Precautions, NPO status , Patient's Chart, lab work & pertinent test results  Airway Mallampati: II  TM Distance: >3 FB Neck ROM: Full    Dental no notable dental hx. (+) Teeth Intact, Dental Advisory Given   Pulmonary    Pulmonary exam normal breath sounds clear to auscultation       Cardiovascular negative cardio ROS Normal cardiovascular exam+ dysrhythmias Atrial Fibrillation + Valvular Problems/Murmurs (MOD w hx of mvp) MR  Rhythm:Irregular Rate:Normal  10/28/2021 TEE 1. Left ventricular ejection fraction, by estimation, is 65 to 70%. The  left ventricle has normal function.   2. Right ventricular systolic function is normal. The right ventricular  size is normal.   3. Left atrial size was moderately dilated. No left atrial/left atrial  appendage thrombus was detected.   4. Right atrial size was moderately dilated.   5. The mitral valve is myxomatous with a large overriding anterior mitral  valve leaflet and a diminutive posterior leaflet that measures about 8-10m  in length. There is bileaflet prolapse with a larger posteriorly directed  jet and a smaller anteriorly  directed jet. Overall, mitral regurgitation appears moderate (EROA of  posterior jet 0.2cm2, RVol 263m. There is no systolic flow reversal in  the pulmonary veins. PASP is 2431m +RAP. . TMarland Kitchene mitral valve is  myxomatous. Moderate mitral valve regurgitation.   6. Tricuspid valve regurgitation is mild to moderate.   7. The aortic valve is tricuspid. There is mild calcification of the  aortic valve. There is mild thickening of the aortic valve. Aortic valve  regurgitation is mild. Aortic valve sclerosis/calcification is present,  without any evidence of aortic  stenosis.   8. Following TEE, the patient underwent successful DCCV with 150J x1.       Neuro/Psych negative neurological ROS     GI/Hepatic negative GI ROS, Neg liver ROS,,,  Endo/Other    Renal/GU Lab Results      Component                Value               Date                      CREATININE               0.70                11/24/2021                    K                        3.2 (L)             11/24/2021                    Musculoskeletal  (+) Arthritis ,    Abdominal   Peds  Hematology Lab Results      Component                Value               Date                      WBC  13.3 (H)            11/24/2021                HGB                      12.9                11/24/2021                HCT                      40.5                11/24/2021                MCV                      97.6                11/24/2021                PLT                      192                 11/24/2021              Anesthesia Other Findings All: Sulfa, cipro, lidocaine, amoxicillin  Reproductive/Obstetrics                             Anesthesia Physical Anesthesia Plan  ASA: 3  Anesthesia Plan: General   Post-op Pain Management: Minimal or no pain anticipated   Induction: Intravenous  PONV Risk Score and Plan: 3 and Treatment may vary due to age or medical condition and Ondansetron  Airway Management Planned: Oral ETT  Additional Equipment: None  Intra-op Plan:   Post-operative Plan: Extubation in OR  Informed Consent: I have reviewed the patients History and Physical, chart, labs and discussed the procedure including the risks, benefits and alternatives for the proposed anesthesia with the patient or authorized representative who has indicated his/her understanding and acceptance.     Dental advisory given  Plan Discussed with: CRNA  Anesthesia Plan Comments: (GA for Lap Chole)        Anesthesia Quick Evaluation

## 2021-11-24 NOTE — Interval H&P Note (Signed)
History and Physical Interval Note:  11/24/2021 2:04 PM  Lisa Lang  has presented today for surgery, with the diagnosis of cholecystitis.  The various methods of treatment have been discussed with the patient and family. After consideration of risks, benefits and other options for treatment, the patient has consented to    Procedure(s): LAPAROSCOPIC CHOLECYSTECTOMY (N/A) as a surgical intervention.    The patient's history has been reviewed, patient examined, no change in status, stable for surgery.  I have reviewed the patient's chart and labs.  Questions were answered to the patient's satisfaction.    Armandina Gemma, [facility] [place] practice Office: [place]

## 2021-11-24 NOTE — Progress Notes (Signed)
Mobility Specialist - Progress Note   11/24/21 1112  Mobility  Activity Ambulated with assistance in hallway  Level of Assistance Independent  Assistive Device None  Distance Ambulated (ft) 80 ft  Activity Response Tolerated well  Mobility Referral Yes  $Mobility charge 1 Mobility   Pt received in room standing and agreeable to mobility. No complaints during mobility. Pt to recliner after session with all needs met.     [facility]

## 2021-11-24 NOTE — Transfer of Care (Signed)
Immediate Anesthesia Transfer of Care Note  Patient: Lisa Lang  Procedure(s) Performed: LAPAROSCOPIC CHOLECYSTECTOMY  Patient Location: PACU  Anesthesia Type:General  Level of Consciousness: awake  Airway & Oxygen Therapy: Patient Spontanous Breathing and Patient connected to face mask oxygen  Post-op Assessment: Report given to RN and Post -op Vital signs reviewed and stable  Post vital signs: Reviewed and stable  Last Vitals:  Vitals Value Taken Time  BP 127/66 11/24/21 1552  Temp    Pulse 74 11/24/21 1557  Resp 21 11/24/21 1557  SpO2 94 % 11/24/21 1557  Vitals shown include unvalidated device data.  Last Pain:  Vitals:   11/24/21 1304  TempSrc:   PainSc: 0-No pain      Patients Stated Pain Goal: 0 (94/50/38 8828)  Complications: No notable events documented.

## 2021-11-25 ENCOUNTER — Encounter (HOSPITAL_COMMUNITY): Payer: Self-pay | Admitting: Surgery

## 2021-11-25 DIAGNOSIS — K810 Acute cholecystitis: Secondary | ICD-10-CM | POA: Diagnosis not present

## 2021-11-25 DIAGNOSIS — K8050 Calculus of bile duct without cholangitis or cholecystitis without obstruction: Secondary | ICD-10-CM | POA: Diagnosis not present

## 2021-11-25 LAB — BASIC METABOLIC PANEL
Anion gap: 7 (ref 5–15)
BUN: 21 mg/dL (ref 8–23)
CO2: 18 mmol/L — ABNORMAL LOW (ref 22–32)
Calcium: 7.7 mg/dL — ABNORMAL LOW (ref 8.9–10.3)
Chloride: 117 mmol/L — ABNORMAL HIGH (ref 98–111)
Creatinine, Ser: 0.64 mg/dL (ref 0.44–1.00)
GFR, Estimated: >60 mL/min (ref >60)
Glucose, Bld: 118 mg/dL — ABNORMAL HIGH (ref 70–99)
Potassium: 4.1 mmol/L (ref 3.5–5.1)
Sodium: 142 mmol/L (ref 135–145)

## 2021-11-25 LAB — CBC
HCT: 40.6 % (ref 36.0–46.0)
Hemoglobin: 12.6 g/dL (ref 12.0–15.0)
MCH: 31 pg (ref 26.0–34.0)
MCHC: 31 g/dL (ref 30.0–36.0)
MCV: 99.8 fL (ref 80.0–100.0)
Platelets: 197 10*3/uL (ref 150–400)
RBC: 4.07 MIL/uL (ref 3.87–5.11)
RDW: 14.5 % (ref 11.5–15.5)
WBC: 9.1 10*3/uL (ref 4.0–10.5)
nRBC: 0 % (ref 0.0–0.2)

## 2021-11-25 MED ORDER — OXYCODONE HCL 5 MG PO TABS: 2.5000 mg | ORAL_TABLET | Freq: Four times a day (QID) | ORAL | 0 refills | Status: DC | PRN

## 2021-11-25 MED ORDER — ORAL CARE MOUTH RINSE: 15.0000 mL | OROMUCOSAL | Status: DC | PRN

## 2021-11-25 MED ORDER — AMIODARONE HCL 200 MG PO TABS
200.0000 mg | ORAL_TABLET | Freq: Every day | ORAL | Status: DC
  Administered 2021-11-25: 200 mg via ORAL
  Filled 2021-11-25: qty 1

## 2021-11-25 MED ORDER — ACETAMINOPHEN 325 MG PO TABS: 650.0000 mg | ORAL_TABLET | ORAL | Status: DC | PRN

## 2021-11-25 MED ORDER — APIXABAN 2.5 MG PO TABS: 2.5000 mg | ORAL_TABLET | Freq: Two times a day (BID) | ORAL | Status: DC

## 2021-11-25 NOTE — Progress Notes (Signed)
Mobility Specialist - Progress Note   11/25/21 1328  Mobility  Activity Ambulated independently in hallway  Level of Assistance Independent  Assistive Device None  Distance Ambulated (ft) 350 ft  Activity Response Tolerated well  Mobility Referral Yes  $Mobility charge 1 Mobility   Pt received in bed and agreeable to mobility. No complaints during mobility. Pt to bathroom after session, NT notified with all needs met.      [facility]

## 2021-11-25 NOTE — Progress Notes (Signed)
Mobility Specialist - Progress Note   11/25/21 1058  Mobility  Activity Ambulated independently in hallway  Level of Assistance Independent  Assistive Device None  Distance Ambulated (ft) 120 ft  Range of Motion/Exercises Active  Activity Response Tolerated well  Mobility Referral Yes  $Mobility charge 1 Mobility   Pt received in room standing and agreeable to mobility. No complaints during mobility. Pt to recliner after session with all needs met w/ friends in room.  [facility]

## 2021-11-25 NOTE — Discharge Summary (Signed)
Physician Discharge Summary   Lisa Lang PPI:[id number] DOB: 1927/01/21 DOA: 11/20/2021  PCP: Lavone Orn, MD  Admit date: 11/20/2021 Discharge date: 11/25/2021  Barriers to discharge: none  Admitted From: Home Disposition:  Home Discharging physician: Lisa Dee, MD  Recommendations for Outpatient Follow-up:  Follow up with surgery  Home Health:  Equipment/Devices:   Discharge Condition: stable CODE STATUS: Full Diet recommendation:  Diet Orders (From admission, onward)     Start     Ordered   11/25/21 1217  Diet Heart Room service appropriate? Yes; Fluid consistency: Thin  Diet effective now       Question Answer Comment  Room service appropriate? Yes   Fluid consistency: Thin      11/25/21 1216   11/25/21 0000  Diet - low sodium heart healthy        11/25/21 1349            Hospital Course: Lisa Lang is a 88 year old female with history of paroxysmal A-fib on Eliquis, hyperlipidemia who presented with right upper quadrant abdominal discomfort, nausea, vomiting, subjective fevers at home.   On presentation, she was hemodynamically stable.  Lab work showed creatinine of 1.06, bilirubin of 1.7, normal liver enzymes, lipase of 25, WBC count of 21.3.   CT abdomen/pelvis showed dilated gallbladder up to 11 cm with associated gallbladder thickening, pericholecystic fluid consistent with acute cholecystitis; also showed stone clusters in the gallbladder fundus as well as evidence of intrahepatic/extrahepatic biliary dilatation with the common bile duct of 10 mm.  MRCP confirmed stone in the cystic duct, choledocholithiasis.   Patient underwent ERCP on 11/23/2021 with GI.  She then underwent laparoscopic cholecystectomy with general surgery on 11/24/2021. She tolerated procedures well and a diet prior to discharge.  She was resumed back on Eliquis prior to discharge and will follow-up outpatient with surgery.  Assessment and Plan: No notes have been filed under  this hospital service. Service: Hospitalist      The patient's chronic medical conditions were treated accordingly per the patient's home medication regimen except as noted.  On day of discharge, patient was felt deemed stable for discharge. Patient/family member advised to call PCP or come back to ER if needed.   Principal Diagnosis: Acute cholecystitis  Discharge Diagnoses: Active Hospital Problems   Diagnosis Date Noted   Acute cholecystitis 11/21/2021   Choledocholithiasis 11/21/2021   Sepsis ([place]) 11/21/2021   Dehydration 11/21/2021   Acute cystitis 11/21/2021   HLD (hyperlipidemia) 11/09/2017   Paroxysmal atrial fibrillation ([place]) 09/27/2017    Resolved Hospital Problems  No resolved problems to display.     Discharge Instructions     Diet - low sodium heart healthy   Complete by: As directed    Increase activity slowly   Complete by: As directed       Allergies as of 11/25/2021       Reactions   Ciprofloxacin Diarrhea   Lidocaine    unknown   Sulfa Antibiotics    unknown   Amoxicillin Rash        Medication List     TAKE these medications    acetaminophen 325 MG tablet Commonly known as: TYLENOL Take 2 tablets (650 mg total) by mouth every 4 (four) hours as needed.   amiodarone 200 MG tablet Commonly known as: PACERONE Take  200 mg  tablet  twice a day or as directed ( follow instruction from 11/02/21)   apixaban 2.5 MG Tabs tablet Commonly known as: Eliquis Take 1 tablet (  2.5 mg total) by mouth 2 (two) times daily.   atorvastatin 40 MG tablet Commonly known as: LIPITOR Take 40 mg by mouth at bedtime.   calcium carbonate 1250 (500 Ca) MG tablet Commonly known as: OS-CAL - dosed in mg of elemental calcium Take 1 tablet by mouth daily with breakfast.   furosemide 20 MG tablet Commonly known as: Lasix Take 1 tablet (20 mg total) by mouth daily as needed.   metoprolol tartrate 25 MG tablet Commonly known as: LOPRESSOR Take 0.5 tablets  (12.5 mg total) by mouth 2 (two) times daily.   MULTIVITAMIN PO Take 1 tablet by mouth daily.   oxyCODONE 5 MG immediate release tablet Commonly known as: Oxy IR/ROXICODONE Take 0.5 tablets (2.5 mg total) by mouth every 6 (six) hours as needed for severe pain.   Vitamin D (Ergocalciferol) 50 MCG (2000 UT) Caps Take 2,000 Units by mouth daily.        Follow-up Information     [facility] Follow up.   Why: our office is scheduling you for post-operative follow up, please call to confirm appointment date/time. Contact information: [address] [place] [address]               Allergies  Allergen Reactions   Ciprofloxacin Diarrhea   Lidocaine     unknown   Sulfa Antibiotics     unknown   Amoxicillin Rash    Consultations: GI General surgery  Procedures: 11/20: ERCP 11/21: Lap CCY  Discharge Exam: BP (!) 132/58 (BP Location: Left Arm)   Pulse 75   Temp 97.6 F (36.4 C) (Oral)   Resp 18   Ht '5\' 2"'$  (1.575 m)   Wt 58.4 kg   SpO2 94%   BMI 23.55 kg/m  Physical Exam Constitutional:      Appearance: She is well-developed.  HENT:     Head: Normocephalic and atraumatic.     Mouth/Throat:     Mouth: Mucous membranes are moist.  Eyes:     Extraocular Movements: Extraocular movements intact.  Cardiovascular:     Rate and Rhythm: Normal rate and regular rhythm.  Pulmonary:     Effort: Pulmonary effort is normal.     Breath sounds: Normal breath sounds.  Abdominal:     General: Bowel sounds are normal. There is no distension.     Palpations: Abdomen is soft.     Tenderness: There is no abdominal tenderness.     Comments: Laparoscopic incisions noted with glue in place and no surrounding signs of infection  Musculoskeletal:        General: Normal range of motion.     Cervical back: Normal range of motion and neck supple.  Skin:    General: Skin is warm and dry.  Neurological:     General: No  focal deficit present.     Mental Status: She is alert and oriented to person, place, and time.  Psychiatric:        Mood and Affect: Mood normal.        Behavior: Behavior normal.      The results of significant diagnostics from this hospitalization (including imaging, microbiology, ancillary and laboratory) are listed below for reference.   Microbiology: Recent Results (from the past 240 hour(s))  Urine Culture     Status: Abnormal   Collection Time: 11/20/21  8:24 PM   Specimen: Urine, Clean Catch  Result Value Ref Range Status   Specimen Description  Final    URINE, CLEAN CATCH Performed at [facility], [place] [address], [place], [place] [address]    Special Requests   Final    NONE Performed at [facility], [place] [address] [place], [place] [address]    Culture (A)  Final    20,000 COLONIES/mL ESCHERICHIA COLI CRITICAL RESULT CALLED TO, READ BACK BY AND VERIFIED WITH: PHARMD G GADHIA [id number] AT 728 AM BY CM Performed at [facility], [place] [address] [place], [place] [address]    Report Status 11/23/2021 FINAL  Final   Organism ID, Bacteria ESCHERICHIA COLI (A)  Final      Susceptibility   Escherichia coli - MIC*    AMPICILLIN <=2 SENSITIVE Sensitive     CEFAZOLIN <=4 SENSITIVE Sensitive     CEFEPIME <=0.12 SENSITIVE Sensitive     CEFTRIAXONE <=0.25 SENSITIVE Sensitive     CIPROFLOXACIN <=0.25 SENSITIVE Sensitive     GENTAMICIN <=1 SENSITIVE Sensitive     IMIPENEM <=0.25 SENSITIVE Sensitive     NITROFURANTOIN <=16 SENSITIVE Sensitive     TRIMETH/SULFA <=20 SENSITIVE Sensitive     AMPICILLIN/SULBACTAM <=2 SENSITIVE Sensitive     PIP/TAZO <=4 SENSITIVE Sensitive     * 20,000 COLONIES/mL ESCHERICHIA COLI  Surgical PCR screen     Status: None   Collection Time: 11/23/21  5:49 AM   Specimen: Nasal Mucosa; Nasal Swab  Result Value Ref Range Status   MRSA, PCR NEGATIVE NEGATIVE Final   Staphylococcus aureus NEGATIVE NEGATIVE  Final    Comment: (NOTE) The Xpert SA Assay (FDA approved for NASAL specimens in patients 62 years of age and older), is one component of a comprehensive surveillance program. It is not intended to diagnose infection nor to guide or monitor treatment. Performed at [facility], [place] [address] [place], [place] [address]      Labs: BNP (last 3 results) Recent Labs    10/27/21 0251  BNP 570.1*   Basic Metabolic Panel: Recent Labs  Lab 11/21/21 0434 11/22/21 0413 11/23/21 0538 11/24/21 0450 11/25/21 0457  NA 140 139 138 138 142  K 3.8 3.3* 3.6 3.2* 4.1  CL 106 111 110 112* 117*  CO2 '26 23 23 '$ 19* 18*  GLUCOSE 125* 119* 111* 103* 118*  BUN 26* 25* '18 17 21  '$ CREATININE 0.95 0.82 0.80 0.70 0.64  CALCIUM 8.5* 7.6* 7.9* 7.3* 7.7*  MG 1.9 1.9 2.0 2.0  --    Liver Function Tests: Recent Labs  Lab 11/20/21 2054 11/21/21 0434 11/22/21 0413 11/23/21 0538 11/24/21 0450  AST 37 33 34 31 30  ALT '26 26 26 27 21  '$ ALKPHOS 105 91 85 107 121  BILITOT 1.7* 1.4* 0.9 0.6 0.5  PROT 7.0 6.1* 5.3* 5.9* 4.8*  ALBUMIN 3.7 3.0* 2.5* 2.9* 2.1*   Recent Labs  Lab 11/20/21 2054 11/24/21 0450  LIPASE 25 25   No results for input(s): "AMMONIA" in the last 168 hours. CBC: Recent Labs  Lab 11/20/21 2054 11/21/21 0434 11/22/21 0413 11/23/21 0538 11/24/21 0450 11/25/21 0457  WBC 21.3* 21.4* 19.6* 20.2* 13.3* 9.1  NEUTROABS 17.4* 18.9* 17.4*  --   --   --   HGB 14.8 13.1 12.3 14.3 12.9 12.6  HCT 46.1* 41.4 38.8 45.0 40.5 40.6  MCV 97.1 97.9 99.2 97.6 97.6 99.8  PLT 195 182 149* 183 192 197   Cardiac Enzymes: No results for input(s): "CKTOTAL", "CKMB", "CKMBINDEX", "TROPONINI" in the last 168 hours. BNP:  Invalid input(s): "POCBNP" CBG: No results for input(s): "GLUCAP" in the last 168 hours. D-Dimer No results for input(s): "DDIMER" in the last 72 hours. Hgb A1c No results for input(s): "HGBA1C" in the last 72 hours. Lipid Profile No results for input(s):  "CHOL", "HDL", "LDLCALC", "TRIG", "CHOLHDL", "LDLDIRECT" in the last 72 hours. Thyroid function studies No results for input(s): "TSH", "T4TOTAL", "T3FREE", "THYROIDAB" in the last 72 hours.  Invalid input(s): "FREET3" Anemia work up No results for input(s): "VITAMINB12", "FOLATE", "FERRITIN", "TIBC", "IRON", "RETICCTPCT" in the last 72 hours. Urinalysis    Component Value Date/Time   COLORURINE AMBER (A) 11/20/2021 2054   APPEARANCEUR CLEAR 11/20/2021 2054   LABSPEC 1.024 11/20/2021 2054   PHURINE 5.0 11/20/2021 2054   GLUCOSEU NEGATIVE 11/20/2021 2054   HGBUR NEGATIVE 11/20/2021 2054   BILIRUBINUR NEGATIVE 11/20/2021 2054   KETONESUR NEGATIVE 11/20/2021 2054   PROTEINUR 100 (A) 11/20/2021 2054   UROBILINOGEN 0.2 08/22/2012 0056   NITRITE NEGATIVE 11/20/2021 2054   LEUKOCYTESUR MODERATE (A) 11/20/2021 2054   Sepsis Labs Recent Labs  Lab 11/22/21 0413 11/23/21 0538 11/24/21 0450 11/25/21 0457  WBC 19.6* 20.2* 13.3* 9.1   Microbiology Recent Results (from the past 240 hour(s))  Urine Culture     Status: Abnormal   Collection Time: 11/20/21  8:24 PM   Specimen: Urine, Clean Catch  Result Value Ref Range Status   Specimen Description   Final    URINE, CLEAN CATCH Performed at [facility], [place] [address], [place], [place] [address]    Special Requests   Final    NONE Performed at [facility], [place] [address] [place], [place] [address]    Culture (A)  Final    20,000 COLONIES/mL ESCHERICHIA COLI CRITICAL RESULT CALLED TO, READ BACK BY AND VERIFIED WITH: PHARMD G GADHIA [id number] AT 728 AM BY CM Performed at [facility], [place] [address], [place], [place] [address]    Report Status 11/23/2021 FINAL  Final   Organism ID, Bacteria ESCHERICHIA COLI (A)  Final      Susceptibility   Escherichia coli - MIC*    AMPICILLIN <=2 SENSITIVE Sensitive     CEFAZOLIN <=4 SENSITIVE Sensitive     CEFEPIME <=0.12 SENSITIVE Sensitive      CEFTRIAXONE <=0.25 SENSITIVE Sensitive     CIPROFLOXACIN <=0.25 SENSITIVE Sensitive     GENTAMICIN <=1 SENSITIVE Sensitive     IMIPENEM <=0.25 SENSITIVE Sensitive     NITROFURANTOIN <=16 SENSITIVE Sensitive     TRIMETH/SULFA <=20 SENSITIVE Sensitive     AMPICILLIN/SULBACTAM <=2 SENSITIVE Sensitive     PIP/TAZO <=4 SENSITIVE Sensitive     * 20,000 COLONIES/mL ESCHERICHIA COLI  Surgical PCR screen     Status: None   Collection Time: 11/23/21  5:49 AM   Specimen: Nasal Mucosa; Nasal Swab  Result Value Ref Range Status   MRSA, PCR NEGATIVE NEGATIVE Final   Staphylococcus aureus NEGATIVE NEGATIVE Final    Comment: (NOTE) The Xpert SA Assay (FDA approved for NASAL specimens in patients 52 years of age and older), is one component of a comprehensive surveillance program. It is not intended to diagnose infection nor to guide or monitor treatment. Performed at [facility], [place] [address], [place], [place] [address]     Procedures/Studies: DG ERCP  Result Date: 11/23/2021 CLINICAL DATA:  Choledocholithiasis EXAM: ERCP TECHNIQUE: Multiple spot images obtained with the fluoroscopic device and submitted for interpretation post-procedure. FLUOROSCOPY: Radiation Exposure Index (as provided by  the fluoroscopic device): 25.7 mGy Kerma COMPARISON:  MRCP 11/21/2021 FINDINGS: A total of 9 intraoperative spot images are submitted for review. The images demonstrate a flexible duodenal scope in the descending duodenum with wire cannulation of the common bile duct and cholangiography. The bile duct is diffusely dilated. Filling defects present within the common bile duct and cystic duct consistent with choledocholithiasis. Subsequent images confirm sphincterotomy and balloon sweeping of the duct. Small amount of contrast material is present within the gallbladder. IMPRESSION: 1. Choledocholithiasis with stones in the cystic duct as well. 2. Biliary ductal dilatation. 3. ERCP with  sphincterotomy and balloon sweeping of the common duct. These images were submitted for radiologic interpretation only. Please see the procedural report for the amount of contrast and the fluoroscopy time utilized. Electronically Signed   By: Jacqulynn Cadet M.D.   On: 11/23/2021 14:44   MR ABDOMEN MRCP W WO CONTAST  Result Date: 11/21/2021 CLINICAL DATA:  88 year old female with history of right upper quadrant abdominal pain. Evaluate for potential choledocholithiasis. EXAM: MRI ABDOMEN WITHOUT AND WITH CONTRAST (INCLUDING MRCP) TECHNIQUE: Multiplanar multisequence MR imaging of the abdomen was performed both before and after the administration of intravenous contrast. Heavily T2-weighted images of the biliary and pancreatic ducts were obtained, and three-dimensional MRCP images were rendered by post processing. CONTRAST:  39m GADAVIST GADOBUTROL 1 MMOL/ML IV SOLN COMPARISON:  No prior abdominal MRI. CT the abdomen and pelvis 11/20/2021. FINDINGS: Lower chest: Cardiomegaly. Hepatobiliary: No suspicious cystic or solid hepatic lesions. Mild intrahepatic biliary ductal dilatation. Common bile duct is dilated measuring up to 11 mm in diameter on MRCP images. Numerous filling defects are noted within the common bile duct measuring up to 9 mm in diameter, compatible with choledocholithiasis. Gallbladder is severely distended. Filling defect lying dependently in the gallbladder, compatible with cholelithiasis. There is also a filling defect in the cystic duct (coronal image 9 of series 3) measuring 7 mm, compatible with a impacted stone. Gallbladder wall is thickened and edematous (4 mm) with trace amount of pericholecystic fluid. Pancreas: 5 mm T1 hypointense, T2 hyperintense, nonenhancing lesion in the tail of the pancreas (axial image 16 of series 4), benign in appearance. No solid-appearing pancreatic mass. No pancreatic ductal dilatation noted on MRCP images. Trace amount of T2 hyperintense fluid adjacent to  the distal body and tail of the pancreas. Spleen:  Unremarkable. Adrenals/Urinary Tract: Right kidney is partially imaged, as it is in a low position in the lower abdomen. No suspicious renal lesions. Mild right hydronephrosis. No left-sided hydroureteronephrosis in the visualized portions of the abdomen. Bilateral adrenal glands are normal in appearance. Stomach/Bowel: Visualized portions are unremarkable. Vascular/Lymphatic: Aortic atherosclerosis without definite aneurysm in the visualized abdominal vasculature. No lymphadenopathy noted in the abdomen. Other:  Trace volume of perihepatic ascites. Musculoskeletal: No aggressive appearing osseous lesions are noted in the visualized portions of the skeleton. IMPRESSION: 1. Today's study is once again positive for cholelithiasis with impacted stone in the cystic duct and evidence of acute cholecystitis, as above. Surgical consultation is recommended. 2. There is also choledocholithiasis with mild intra and extrahepatic biliary ductal dilatation, as above. 3. Trace amount of fluid adjacent to the distal body and tail of the pancreas. Correlation with lipase levels is recommended to exclude the possibility of acute pancreatitis. 4. Trace volume of perihepatic ascites. 5. Low position of the right kidney, with mild right hydronephrosis. A cause of obstruction is not identified on today's examination, but based on correlation with prior CT examination, this may reflect  a UPJ obstruction. 6. Aortic atherosclerosis. Electronically Signed   By: Vinnie Langton M.D.   On: 11/21/2021 09:49   MR 3D Recon At Scanner  Result Date: 11/21/2021 CLINICAL DATA:  88 year old female with history of right upper quadrant abdominal pain. Evaluate for potential choledocholithiasis. EXAM: MRI ABDOMEN WITHOUT AND WITH CONTRAST (INCLUDING MRCP) TECHNIQUE: Multiplanar multisequence MR imaging of the abdomen was performed both before and after the administration of intravenous contrast.  Heavily T2-weighted images of the biliary and pancreatic ducts were obtained, and three-dimensional MRCP images were rendered by post processing. CONTRAST:  50m GADAVIST GADOBUTROL 1 MMOL/ML IV SOLN COMPARISON:  No prior abdominal MRI. CT the abdomen and pelvis 11/20/2021. FINDINGS: Lower chest: Cardiomegaly. Hepatobiliary: No suspicious cystic or solid hepatic lesions. Mild intrahepatic biliary ductal dilatation. Common bile duct is dilated measuring up to 11 mm in diameter on MRCP images. Numerous filling defects are noted within the common bile duct measuring up to 9 mm in diameter, compatible with choledocholithiasis. Gallbladder is severely distended. Filling defect lying dependently in the gallbladder, compatible with cholelithiasis. There is also a filling defect in the cystic duct (coronal image 9 of series 3) measuring 7 mm, compatible with a impacted stone. Gallbladder wall is thickened and edematous (4 mm) with trace amount of pericholecystic fluid. Pancreas: 5 mm T1 hypointense, T2 hyperintense, nonenhancing lesion in the tail of the pancreas (axial image 16 of series 4), benign in appearance. No solid-appearing pancreatic mass. No pancreatic ductal dilatation noted on MRCP images. Trace amount of T2 hyperintense fluid adjacent to the distal body and tail of the pancreas. Spleen:  Unremarkable. Adrenals/Urinary Tract: Right kidney is partially imaged, as it is in a low position in the lower abdomen. No suspicious renal lesions. Mild right hydronephrosis. No left-sided hydroureteronephrosis in the visualized portions of the abdomen. Bilateral adrenal glands are normal in appearance. Stomach/Bowel: Visualized portions are unremarkable. Vascular/Lymphatic: Aortic atherosclerosis without definite aneurysm in the visualized abdominal vasculature. No lymphadenopathy noted in the abdomen. Other:  Trace volume of perihepatic ascites. Musculoskeletal: No aggressive appearing osseous lesions are noted in the  visualized portions of the skeleton. IMPRESSION: 1. Today's study is once again positive for cholelithiasis with impacted stone in the cystic duct and evidence of acute cholecystitis, as above. Surgical consultation is recommended. 2. There is also choledocholithiasis with mild intra and extrahepatic biliary ductal dilatation, as above. 3. Trace amount of fluid adjacent to the distal body and tail of the pancreas. Correlation with lipase levels is recommended to exclude the possibility of acute pancreatitis. 4. Trace volume of perihepatic ascites. 5. Low position of the right kidney, with mild right hydronephrosis. A cause of obstruction is not identified on today's examination, but based on correlation with prior CT examination, this may reflect a UPJ obstruction. 6. Aortic atherosclerosis. Electronically Signed   By: DVinnie LangtonM.D.   On: 11/21/2021 09:49   DG Chest Port 1 View  Result Date: 11/21/2021 CLINICAL DATA:  Sepsis.  Pain and fever. EXAM: PORTABLE CHEST 1 VIEW COMPARISON:  October 24, 2021 FINDINGS: Cardiomegaly. The hila and mediastinum are unchanged. No pneumothorax. No nodules or masses. No focal infiltrates. No cause for sepsis identified. IMPRESSION: No active disease. Electronically Signed   By: DDorise BullionIII M.D.   On: 11/21/2021 09:02   CT ABDOMEN PELVIS W CONTRAST  Result Date: 11/20/2021 CLINICAL DATA:  Worsening right lower quadrant pain for 2 days. 88year old female. EXAM: CT ABDOMEN AND PELVIS WITH CONTRAST TECHNIQUE: Multidetector CT imaging of the  abdomen and pelvis was performed using the standard protocol following bolus administration of intravenous contrast. RADIATION DOSE REDUCTION: This exam was performed according to the departmental dose-optimization program which includes automated exposure control, adjustment of the mA and/or kV according to patient size and/or use of iterative reconstruction technique. CONTRAST:  138m OMNIPAQUE IOHEXOL 300 MG/ML  SOLN  COMPARISON:  CT with IV contrast 09/26/2017. FINDINGS: Lower chest: 4 mm chronic left lower lobe nodule in the posterolateral extreme base, stable. No follow-up is needed. There is mild cardiomegaly with a right chamber predominance indicating likely chronic right heart dysfunction. This was seen previously but has slightly worsened since 2019. Calcification partially visible in the aortic valve plane and seen previously. There are heavy calcifications in the descending thoracic aorta. Hepatobiliary: The liver 16 cm in length and mildly steatotic. There is no mass enhancement. The gallbladder is dilated up to nearly 11 cm, with wall thickening and pericholecystic fluid worrisome for acute cholecystitis. There are several small stones clustered in the fundus. There is increased intrahepatic and extrahepatic biliary dilatation with common bile duct now 10 mm, and there is choledocholithiasis not seen previously. This consists of several stones in the distal common bile duct, at least 4 and possibly as many as 5 stones, largest is 9.2 by 5 mm and the smallest is 4 mm. Pancreas: Partially atrophic but unchanged. Slightly prominent pancreatic duct is also unchanged. Spleen: Normal. Adrenals/Urinary Tract: There is no adrenal mass. There are few bilateral tiny cortical hypodensities in both kidneys which are too small to characterize. Some of them were present previously but a few of them are new. In any case no follow-up imaging is recommended. For reference seeJACR 2018 Feb; 264-273, Management of the Incidental Renal Mass on CT, [employer] 2021; 814-848, Bosniak Classification of Cystic Renal Masses, Version 2019. There is no urinary stone or obstruction.  No bladder thickening. Stomach/Bowel: The gastric wall is contracted. No small bowel dilatation or inflammation is seen. There is a normal caliber appendix. There is mild fecal stasis. There is advanced sigmoid diverticulosis, mild generalized wall thickening in  the sigmoid colon which was seen previously and no findings concerning for acute inflammatory change. Vascular/Lymphatic: Moderate to heavy aortic and branch vessel atherosclerosis. Chronic left pelvic venous congestion. No enlarged lymph nodes. Reproductive: Small sized uterus versus prior supracervical hysterectomy, appearance unchanged. Again noted, there is a thin walled homogeneous uncomplicated cyst in the left adnexa likely arising from the left ovary, today measuring 4.0 x 3.4 cm, previously 2.9 x 3.6 cm, Hounsfield density of 4.3, and prior ultrasound 09/26/2017 demonstrating an anechoic uncomplicated cyst. Other: Aside from mild pericholecystic fluid, no other free fluid is seen. There is no free air, free hemorrhage, abscess or incarcerated hernia. There is a small umbilical fat hernia. Musculoskeletal: Osteopenia and degenerative change lumbar spine. No acute or other significant osseous findings. [place] left femoral head. IMPRESSION: 1. Likely acute cholecystitis, with cholelithiasis and choledocholithiasis, with intrahepatic and extrahepatic biliary dilatation. 2. Constipation and diverticulosis. No bowel obstruction or inflammation. The sigmoid colon wall is chronically thickened but no more than previously. 3. Aortic atherosclerosis.  Chronic left pelvic venous congestion. 4. Cardiomegaly with right chamber predominance, slightly worsened since 2019. 5. 4.0 x 3.4 cm uncomplicated left ovarian cyst, previously 2.9 x 3.6 cm. Continued annual ultrasound follow-up suggested. 6. Tiny renal hypodensities too small to characterize. No follow-up recommended. 7. Small umbilical fat hernia. 8. Osteopenia and degenerative change. Aortic Atherosclerosis (ICD10-I70.0). Electronically Signed   By: KLanny Hurst  Chesser M.D.   On: 11/20/2021 23:20   ECHO TEE  Result Date: 10/28/2021    TRANSESOPHOGEAL ECHO REPORT   Patient Name:   LAYAAN MOTT Date of Exam: 10/28/2021 Medical Rec #:  [id number]     Height:        62.0 in Accession #:    [id number]    Weight:       108.2 lb Date of Birth:  Apr 23, 1927      BSA:          1.472 m Patient Age:    30 years      BP:           91/61 mmHg Patient Gender: F             HR:           110 bpm. Exam Location:  Inpatient Procedure: TEE-Intraopertive, 3D Echo, Cardiac Doppler and Color Doppler Indications:     mitral regurgitation. atrial fibrillation  History:         Patient has prior history of Echocardiogram examinations, most                  recent 10/25/2021. Mitral Valve Prolapse; Risk                  Factors:Dyslipidemia.  Sonographer:     Johny Chess RDCS Referring Phys:  [id number] Greer Ee P[place] Diagnosing Phys: Gwyndolyn Kaufman MD PROCEDURE: After discussion of the risks and benefits of a TEE, an informed consent was obtained from the patient. The transesophogeal probe was passed without difficulty through the esophogus of the patient. Sedation performed by different physician. The patient was monitored while under deep sedation. Anesthestetic sedation was provided intravenously by Anesthesiology: '178mg'$  of Propofol. The patient developed no complications during the procedure. A direct current cardioversion was performed. IMPRESSIONS  1. Left ventricular ejection fraction, by estimation, is 65 to 70%. The left ventricle has normal function.  2. Right ventricular systolic function is normal. The right ventricular size is normal.  3. Left atrial size was moderately dilated. No left atrial/left atrial appendage thrombus was detected.  4. Right atrial size was moderately dilated.  5. 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 Overall, mitral regurgitation appears moderate (EROA of posterior jet 0.2cm2, RVol 293m. There is no systolic flow reversal in  the pulmonary veins. PASP is 242m +RAP. . TMarland Kitchene mitral valve is myxomatous. Moderate mitral valve regurgitation.  6. Tricuspid valve regurgitation is mild to moderate.  7. The aortic valve is tricuspid. There is mild calcification of the aortic valve. There is mild thickening of the aortic valve. Aortic valve regurgitation is mild. Aortic valve sclerosis/calcification is present, without any evidence of aortic stenosis.  8. Following TEE, the patient underwent successful DCCV with 150J x1. FINDINGS  Left Ventricle: Left ventricular ejection fraction, by estimation, is 65 to 70%. The left ventricle has normal function. The left ventricular internal cavity size was normal in size. Right Ventricle: The right ventricular size is normal. No increase in right ventricular wall thickness. Right ventricular systolic function is normal. Left Atrium: Left atrial size was moderately dilated. No left atrial/left atrial appendage thrombus was detected. Right Atrium: Right atrial size was moderately dilated. Pericardium: There is no evidence of pericardial effusion. Mitral Valve: The mitral  valve is myxomatous with a large overriding anterior mitral valve leaflet and a diminutive posterior leaflet that measures about 8-54m in length. 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 Overall, mitral regurgitation appears moderate (EROA of posterior jet 0.2cm2, RVol 259m. There is no systolic flow reversal in the pulmonary veins. PASP is 2440m +RAP. The mitral valve is myxomatous. Moderate mitral valve regurgitation. Tricuspid Valve: The tricuspid valve is normal in structure. Tricuspid valve regurgitation is mild to moderate. Aortic Valve: The aortic valve is tricuspid. There is mild calcification of the aortic valve. There is mild thickening of the aortic valve. Aortic valve regurgitation is mild. Aortic valve sclerosis/calcification is present, without any evidence of  aortic stenosis. Pulmonic Valve: The pulmonic valve was normal in structure. Pulmonic valve regurgitation is trivial. Aorta: The aortic root is normal in size and structure. IAS/Shunts: The atrial septum is grossly normal.  MR Peak grad:    92.5 mmHg MR Mean grad:    55.0 mmHg MR Vmax:         481.00 cm/s MR Vmean:        343.0 cm/s MR PISA:         2.26 cm MR PISA Eff ROA: 18 mm MR PISA Radius:  0.60 cm HeaGwyndolyn Kaufman Electronically signed by HeaGwyndolyn Kaufman Signature Date/Time: 10/28/2021/11:20:18 AM    Final      Time coordinating discharge: Over 30 minutes    DavDwyane DeeD  [facility] 11/25/2021, 4:33 PM

## 2021-11-25 NOTE — Discharge Instructions (Signed)
[facility] LAPAROSCOPIC SURGERY: POST OP INSTRUCTIONS Always review your discharge instruction sheet given to you by the facility where your surgery was performed. IF YOU HAVE DISABILITY OR FAMILY LEAVE FORMS, YOU MUST BRING THEM TO THE OFFICE FOR PROCESSING.   DO NOT GIVE THEM TO YOUR DOCTOR.  PAIN CONTROL  First take acetaminophen (Tylenol) AND/or ibuprofen (Advil) to control your pain after surgery.  Follow directions on package.  Taking acetaminophen (Tylenol) and/or ibuprofen (Advil) regularly after surgery will help to control your pain and lower the amount of prescription pain medication you may need.  You should not take more than 3,000 mg (3 grams) of acetaminophen (Tylenol) in 24 hours.  You should not take ibuprofen (Advil), aleve, motrin, naprosyn or other NSAIDS if you have a history of stomach ulcers or chronic kidney disease.  A prescription for pain medication may be given to you upon discharge.  Take your pain medication as prescribed, if you still have uncontrolled pain after taking acetaminophen (Tylenol) or ibuprofen (Advil). Use ice packs to help control pain. If you need a refill on your pain medication, please contact your pharmacy.  They will contact our office to request authorization. Prescriptions will not be filled after 5pm or on week-ends.  HOME MEDICATIONS Take your usually prescribed medications unless otherwise directed.  DIET You should follow a light diet the first few days after arrival home.  Be sure to include lots of fluids daily. Avoid fatty, fried foods.   CONSTIPATION It is common to experience some constipation after surgery and if you are taking pain medication.  Increasing fluid intake and taking a stool softener (such as Colace) will usually help or prevent this problem from occurring.  A mild laxative (Milk of Magnesia or Miralax) should be taken according to package instructions if there are no bowel movements after 48  hours.  WOUND/INCISION CARE Most patients will experience some swelling and bruising in the area of the incisions.  Ice packs will help.  Swelling and bruising can take several days to resolve.  Unless discharge instructions indicate otherwise, follow guidelines below  STERI-STRIPS - you may remove your outer bandages 48 hours after surgery, and you may shower at that time.  You have steri-strips (small skin tapes) in place directly over the incision.  These strips should be left on the skin for 7-10 days.   DERMABOND/SKIN GLUE - you may shower in 24 hours.  The glue will flake off over the next 2-3 weeks. Any sutures or staples will be removed at the office during your follow-up visit.  ACTIVITIES You may resume regular (light) daily activities beginning the next day--such as daily self-care, walking, climbing stairs--gradually increasing activities as tolerated.  You may have sexual intercourse when it is comfortable.  Refrain from any heavy lifting or straining until approved by your doctor. You may drive when you are no longer taking prescription pain medication, you can comfortably wear a seatbelt, and you can safely maneuver your car and apply brakes.  FOLLOW-UP You should see your doctor in the office for a follow-up appointment approximately 2-3 weeks after your surgery.  You should have been given your post-op/follow-up appointment when your surgery was scheduled.  If you did not receive a post-op/follow-up appointment, make sure that you call for this appointment within a day or two after you arrive home to insure a convenient appointment time.   WHEN TO CALL YOUR DOCTOR: Fever over 101.0 Inability to urinate Continued bleeding from incision.   Increased pain, redness, or drainage from the incision. Increasing abdominal pain  The clinic staff is available to answer your questions during regular business hours.  Please don't hesitate to call and ask to speak to one of the nurses for  clinical concerns.  If you have a medical emergency, go to the nearest emergency room or call 911.  A surgeon from [facility] is always on call at the hospital. [address], [address], [place], [place]  [address] ? [address] [address], [place], [place]   [address] [phone] ? [phone] ? FAX [phone] Web site: [email or web address]  

## 2021-11-25 NOTE — Progress Notes (Signed)
[facility] Progress Note  1 Day Post-Op  Subjective: CC:  Reports feeling much better. She is up in the chair and states her abdominal pain is much better. She has been up to the bathroom and denies issues with urination. Reports she had a BM. Is having flatus. Denies nausea.  Objective: Vital signs in last 24 hours: Temp:  [97.5 F (36.4 C)-98.2 F (36.8 C)] 97.9 F (36.6 C) (11/22 1004) Pulse Rate:  [64-97] 74 (11/22 1004) Resp:  [12-26] 20 (11/22 1004) BP: (110-136)/(50-68) 130/68 (11/22 1004) SpO2:  [90 %-98 %] 90 % (11/22 1004) Weight:  [58.4 kg] 58.4 kg (11/22 0500) Last BM Date : 11/25/21  Intake/Output from previous day: 11/21 0701 - 11/22 0700 In: 900 [I.V.:800; IV Piggyback:100] Out: 50 [Blood:50] Intake/Output this shift: No intake/output data recorded.  PE: Gen:  Alert, NAD, pleasant Card:  Regular rate and rhythm, pedal pulses 2+ BL Pulm:  Normal effort, clear to auscultation bilaterally Abd: Soft, mild distention, overall nontender, incisions c/d/I  Skin: warm and dry, no rashes  Psych: A&Ox3   Lab Results:  Recent Labs    11/24/21 0450 11/25/21 0457  WBC 13.3* 9.1  HGB 12.9 12.6  HCT 40.5 40.6  PLT 192 197   BMET Recent Labs    11/24/21 0450 11/25/21 0457  NA 138 142  K 3.2* 4.1  CL 112* 117*  CO2 19* 18*  GLUCOSE 103* 118*  BUN 17 21  CREATININE 0.70 0.64  CALCIUM 7.3* 7.7*   PT/INR No results for input(s): "LABPROT", "INR" in the last 72 hours. CMP     Component Value Date/Time   NA 142 11/25/2021 0457   K 4.1 11/25/2021 0457   CL 117 (H) 11/25/2021 0457   CO2 18 (L) 11/25/2021 0457   GLUCOSE 118 (H) 11/25/2021 0457   BUN 21 11/25/2021 0457   CREATININE 0.64 11/25/2021 0457   CALCIUM 7.7 (L) 11/25/2021 0457   PROT 4.8 (L) 11/24/2021 0450   ALBUMIN 2.1 (L) 11/24/2021 0450   AST 30 11/24/2021 0450   ALT 21 11/24/2021 0450   ALKPHOS 121 11/24/2021 0450   BILITOT 0.5 11/24/2021 0450   GFRNONAA >60 11/25/2021  0457   GFRAA 57 (L) 09/28/2017 0413   Lipase     Component Value Date/Time   LIPASE 25 11/24/2021 0450       Studies/Results: DG ERCP  Result Date: 11/23/2021 CLINICAL DATA:  Choledocholithiasis EXAM: ERCP TECHNIQUE: Multiple spot images obtained with the fluoroscopic device and submitted for interpretation post-procedure. FLUOROSCOPY: Radiation Exposure Index (as provided by the fluoroscopic device): 25.7 mGy Kerma COMPARISON:  MRCP 11/21/2021 FINDINGS: A total of 9 intraoperative spot images are submitted for review. The images demonstrate a flexible duodenal scope in the descending duodenum with wire cannulation of the common bile duct and cholangiography. The bile duct is diffusely dilated. Filling defects present within the common bile duct and cystic duct consistent with choledocholithiasis. Subsequent images confirm sphincterotomy and balloon sweeping of the duct. Small amount of contrast material is present within the gallbladder. IMPRESSION: 1. Choledocholithiasis with stones in the cystic duct as well. 2. Biliary ductal dilatation. 3. ERCP with sphincterotomy and balloon sweeping of the common duct. These images were submitted for radiologic interpretation only. Please see the procedural report for the amount of contrast and the fluoroscopy time utilized. Electronically Signed   By: Jacqulynn Cadet M.D.   On: 11/23/2021 14:44    Anti-infectives: Anti-infectives (From admission, onward)    Start  Dose/Rate Route Frequency Ordered Stop   11/24/21 1444  metroNIDAZOLE (FLAGYL) 500 MG/100ML IVPB       Note to Pharmacy: Jasmine Pang K: cabinet override      11/24/21 1444 11/24/21 1449   11/21/21 2200  cefTRIAXone (ROCEPHIN) 2 g in sodium chloride 0.9 % 100 mL IVPB        2 g 200 mL/hr over 30 Minutes Intravenous Every 24 hours 11/21/21 0029     11/21/21 0030  metroNIDAZOLE (FLAGYL) IVPB 500 mg        500 mg 100 mL/hr over 60 Minutes Intravenous Every 12 hours 11/21/21 0029      11/20/21 2230  cefTRIAXone (ROCEPHIN) 1 g in sodium chloride 0.9 % 100 mL IVPB        1 g 200 mL/hr over 30 Minutes Intravenous  Once 11/20/21 2218 11/20/21 2331        Assessment/Plan  Choledocholithiasis - S/p ERCP w/ sphincterotomy 11/20 Dr. Therisa Doyne Acute calculous cholecystitis  POD#1 s/p laparoscopic cholecystectomy 11/21 Dr. Harlow Asa - afebrile, VSS, hgb stable - clinically the patient is doing well. Pain controlled, tolerating CLD, +flatus and BMs. She has been OOB and walked in the halls. Advance diet to [facility]. - ok to resume Eliquis from [facility] standpoint - stable for discharge from surgical perspective. I will arrange follow up for the patient. Post-operative instructions were discussed with the patient and her son who will be staying with her upon hospital discharge.   LOS: 4 days   I reviewed nursing notes, Consultant cardiology notes, hospitalist notes, last 24 h vitals and pain scores, last 48 h intake and output, last 24 h labs and trends, and last 24 h imaging results.    Obie Dredge, PA-C [facility] Please see Amion for pager number during day hours 7:00am-4:30pm

## 2021-11-25 NOTE — Hospital Course (Signed)
Lisa Lang is a 88 year old female with history of paroxysmal A-fib on Eliquis, hyperlipidemia who presented with right upper quadrant abdominal discomfort, nausea, vomiting, subjective fevers at home.   On presentation, she was hemodynamically stable.  Lab work showed creatinine of 1.06, bilirubin of 1.7, normal liver enzymes, lipase of 25, WBC count of 21.3.   CT abdomen/pelvis showed dilated gallbladder up to 11 cm with associated gallbladder thickening, pericholecystic fluid consistent with acute cholecystitis; also showed stone clusters in the gallbladder fundus as well as evidence of intrahepatic/extrahepatic biliary dilatation with the common bile duct of 10 mm.  MRCP confirmed stone in the cystic duct, choledocholithiasis.   Patient underwent ERCP on 11/23/2021 with GI.  She then underwent laparoscopic cholecystectomy with general surgery on 11/24/2021. She tolerated procedures well and a diet prior to discharge.  She was resumed back on Eliquis prior to discharge and will follow-up outpatient with surgery.

## 2021-11-25 NOTE — Progress Notes (Signed)
  Transition of Care [facility]) Screening Note   Patient Details  Name: MARIADEJESUS CADE Date of Birth: [birth year]   Transition of Care [facility]) CM/SW Contact:    Roseanne Kaufman, RN Phone Number: 11/25/2021, 2:02 PM    Transition of Care Department [facility]) has reviewed patient and no TOC needs have been identified at this time. We will continue to monitor patient advancement through interdisciplinary progression rounds. If new patient transition needs arise, please place a TOC consult.

## 2021-11-25 NOTE — Progress Notes (Addendum)
Rounding Note    Patient Name: Lisa Lang Date of Encounter: 11/25/2021  [place] Cardiologist: Glenetta Hew, MD   Subjective   She feels so much better, no long having pain. She never had any symptoms for her A fib RVR.   Inpatient Medications    Scheduled Meds:  acetaminophen  650 mg Oral Q6H   amiodarone  200 mg Oral Daily   feeding supplement  1 Container Oral TID BM   metoprolol tartrate  12.5 mg Oral BID   mupirocin ointment  1 Application Nasal BID   Continuous Infusions:  sodium chloride 75 mL/hr at 11/24/21 1758   cefTRIAXone (ROCEPHIN)  IV 2 g (11/24/21 2218)   metronidazole 500 mg (11/25/21 0156)   PRN Meds: HYDROmorphone (DILAUDID) injection, naLOXone (NARCAN)  injection, ondansetron (ZOFRAN) IV, mouth rinse, oxyCODONE   Vital Signs    Vitals:   11/24/21 2001 11/25/21 0025 11/25/21 0500 11/25/21 0524  BP: (!) 122/54 (!) 110/58  (!) 120/51  Pulse: 69 64  65  Resp: 20 20    Temp: 97.8 F (36.6 C) (!) 97.5 F (36.4 C)  97.9 F (36.6 C)  TempSrc: Oral Oral  Oral  SpO2: 98% 96%  95%  Weight:   58.4 kg   Height:        Intake/Output Summary (Last 24 hours) at 11/25/2021 0805 Last data filed at 11/24/2021 1557 Gross per 24 hour  Intake 900 ml  Output 50 ml  Net 850 ml      11/25/2021    5:00 AM 11/23/2021   12:49 PM 11/23/2021    5:12 AM  Last 3 Weights  Weight (lbs) 128 lb 12 oz 119 lb 0.8 oz 119 lb 0.8 oz  Weight (kg) 58.4 kg 54 kg 54 kg      Telemetry   Converted to sinus rhythm this morning, had A fib RVR up to 140s over the past 24 hours- Personally Reviewed  ECG    N/A today - Personally Reviewed  Physical Exam   GEN: No acute distress.   Neck: No JVD Cardiac: RRR,  soft murmur noted  Respiratory: Clear to auscultation bilaterally. On room air  GI: Soft, nontender on light palpation, Lap incision intact  MS: No leg edema; No deformity. Neuro:  Nonfocal  Psych: Normal affect   Labs    High Sensitivity  Troponin:  No results for input(s): "TROPONINIHS" in the last 720 hours.   Chemistry Recent Labs  Lab 11/22/21 0413 11/23/21 0538 11/24/21 0450 11/25/21 0457  NA 139 138 138 142  K 3.3* 3.6 3.2* 4.1  CL 111 110 112* 117*  CO2 23 23 19* 18*  GLUCOSE 119* 111* 103* 118*  BUN 25* '18 17 21  '$ CREATININE 0.82 0.80 0.70 0.64  CALCIUM 7.6* 7.9* 7.3* 7.7*  MG 1.9 2.0 2.0  --   PROT 5.3* 5.9* 4.8*  --   ALBUMIN 2.5* 2.9* 2.1*  --   AST 34 31 30  --   ALT '26 27 21  '$ --   ALKPHOS 85 107 121  --   BILITOT 0.9 0.6 0.5  --   GFRNONAA >60 >60 >60 >60  ANIONGAP '5 5 7 7    '$ Lipids No results for input(s): "CHOL", "TRIG", "HDL", "LABVLDL", "LDLCALC", "CHOLHDL" in the last 168 hours.  Hematology Recent Labs  Lab 11/23/21 0538 11/24/21 0450 11/25/21 0457  WBC 20.2* 13.3* 9.1  RBC 4.61 4.15 4.07  HGB 14.3 12.9 12.6  HCT 45.0  40.5 40.6  MCV 97.6 97.6 99.8  MCH 31.0 31.1 31.0  MCHC 31.8 31.9 31.0  RDW 14.1 14.2 14.5  PLT 183 192 197   Thyroid No results for input(s): "TSH", "FREET4" in the last 168 hours.  BNPNo results for input(s): "BNP", "PROBNP" in the last 168 hours.  DDimer No results for input(s): "DDIMER" in the last 168 hours.   Radiology    DG ERCP  Result Date: 11/23/2021 CLINICAL DATA:  Choledocholithiasis EXAM: ERCP TECHNIQUE: Multiple spot images obtained with the fluoroscopic device and submitted for interpretation post-procedure. FLUOROSCOPY: Radiation Exposure Index (as provided by the fluoroscopic device): 25.7 mGy Kerma COMPARISON:  MRCP 11/21/2021 FINDINGS: A total of 9 intraoperative spot images are submitted for review. The images demonstrate a flexible duodenal scope in the descending duodenum with wire cannulation of the common bile duct and cholangiography. The bile duct is diffusely dilated. Filling defects present within the common bile duct and cystic duct consistent with choledocholithiasis. Subsequent images confirm sphincterotomy and balloon sweeping of the  duct. Small amount of contrast material is present within the gallbladder. IMPRESSION: 1. Choledocholithiasis with stones in the cystic duct as well. 2. Biliary ductal dilatation. 3. ERCP with sphincterotomy and balloon sweeping of the common duct. These images were submitted for radiologic interpretation only. Please see the procedural report for the amount of contrast and the fluoroscopy time utilized. Electronically Signed   By: Jacqulynn Cadet M.D.   On: 11/23/2021 14:44    Cardiac Studies   TEE from 10/28/21:    1. Left ventricular ejection fraction, by estimation, is 65 to 70%. The  left ventricle has normal function.   2. Right ventricular systolic function is normal. The right ventricular  size is normal.   3. Left atrial size was moderately dilated. No left atrial/left atrial  appendage thrombus was detected.   4. Right atrial size was moderately dilated.   5. 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 There is bileaflet prolapse with a larger posteriorly directed  jet and a smaller anteriorly  directed jet. Overall, mitral regurgitation appears moderate (EROA of  posterior jet 0.2cm2, RVol 256m. There is no systolic flow reversal in  the pulmonary veins. PASP is 2459m +RAP. . TMarland Kitchene mitral valve is  myxomatous. Moderate mitral valve regurgitation.   6. Tricuspid valve regurgitation is mild to moderate.   7. The aortic valve is tricuspid. There is mild calcification of the  aortic valve. There is mild thickening of the aortic valve. Aortic valve  regurgitation is mild. Aortic valve sclerosis/calcification is present,  without any evidence of aortic  stenosis.   8. Following TEE, the patient underwent successful DCCV with 150J x1.    TTE from 10/25/2021:   1. Left ventricular ejection fraction, by estimation, is 65 to 70%. The  left ventricle has normal  function. The left ventricle has no regional  wall motion abnormalities. Left ventricular diastolic function could not  be evaluated.   2. Right ventricular systolic function is normal. The right ventricular  size is normal. There is moderately elevated pulmonary artery systolic  pressure. The estimated right ventricular systolic pressure is 57.54.0Hg.   3. Left atrial size was moderately dilated.   4. Right atrial size was moderately dilated.   5. 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 The posterior  leaflet is dimunitive and there is at least  moderate posterior MR. The  mitral valve is myxomatous. Moderate mitral valve regurgitation. There is  severe prolapse of both leaflets  of the mitral valve. Moderate mitral annular calcification.   6. Tricuspid valve regurgitation is moderate.   7. The aortic valve is tricuspid. There is moderate calcification of the  aortic valve. Aortic valve regurgitation is mild to moderate. Aortic valve  sclerosis/calcification is present, without any evidence of aortic  stenosis.   Patient Profile     88 y.o. 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 Cardiology was consulted for pre-op evaluation.   Assessment & Plan    Paroxysmal A fib - hx of DCCV 10/28/21 and 11/06/21 - TSH WNL from 10/24/21  - A fib with intermittent RVR over the past 24 hours, converted to sinus rhythm this morning, never was symptomatic,  will transition IV amiodarone to PO '200mg'$  daily today - continue PTA metoprolol 12.5 BID  - CHADS2VASc 4 due to age, gender, CHF, anticoagulation with Eliquis is on hold currently due to pending surgery, would resume as soon as cleared by surgery team  - Keep K >4 and Mag >2 - already has appt with Dr Ellyn Hack on 12/31/21   Acute cholecystits  - RCRI is 1, 0.9 % perioperative risk of  major cardiac events - Post op lap chole, doing well so far   Heart failure due to valvular disease  Moderate to severe mitral regurgitation  MVP - clinically compensated, EF preserved on recent Echo, consider PRN lasix if signs of hypervolemia occurs, outpatient follow up with cardiology      For questions or updates, please contact [place] Please consult [email or web address] for contact info under        Signed, Margie Billet, NP  11/25/2021, 8:05 AM    Personally seen and examined. Agree with above.  Back in normal sinus rhythm.  Had acute cholecystitis.  Cholecystectomy.  Overall doing well.  Compensated.  Changing to amiodarone 200 mg a day.  We will see her in follow-up.  We will go ahead and sign off.  Please let us know if we can be of further assistance.  Candee Furbish, MD

## 2021-11-27 ENCOUNTER — Encounter (HOSPITAL_COMMUNITY): Payer: Self-pay | Admitting: Gastroenterology

## 2021-11-27 LAB — SURGICAL PATHOLOGY

## 2021-12-01 DIAGNOSIS — L570 Actinic keratosis: Secondary | ICD-10-CM | POA: Diagnosis not present

## 2021-12-01 DIAGNOSIS — X32XXXD Exposure to sunlight, subsequent encounter: Secondary | ICD-10-CM | POA: Diagnosis not present

## 2021-12-01 DIAGNOSIS — R600 Localized edema: Secondary | ICD-10-CM | POA: Diagnosis not present

## 2021-12-24 ENCOUNTER — Ambulatory Visit (HOSPITAL_COMMUNITY): Payer: Medicare Other

## 2021-12-25 ENCOUNTER — Other Ambulatory Visit: Payer: Self-pay | Admitting: Cardiology

## 2021-12-31 ENCOUNTER — Ambulatory Visit: Payer: Medicare Other | Attending: Cardiology | Admitting: Cardiology

## 2021-12-31 ENCOUNTER — Encounter: Payer: Self-pay | Admitting: Cardiology

## 2021-12-31 VITALS — BP 151/75 | HR 69 | Ht 62.0 in | Wt 102.4 lb

## 2021-12-31 DIAGNOSIS — R5383 Other fatigue: Secondary | ICD-10-CM | POA: Diagnosis not present

## 2021-12-31 DIAGNOSIS — I480 Paroxysmal atrial fibrillation: Secondary | ICD-10-CM | POA: Diagnosis not present

## 2021-12-31 DIAGNOSIS — I38 Endocarditis, valve unspecified: Secondary | ICD-10-CM | POA: Diagnosis not present

## 2021-12-31 DIAGNOSIS — D6869 Other thrombophilia: Secondary | ICD-10-CM

## 2021-12-31 DIAGNOSIS — I340 Nonrheumatic mitral (valve) insufficiency: Secondary | ICD-10-CM

## 2021-12-31 DIAGNOSIS — I341 Nonrheumatic mitral (valve) prolapse: Secondary | ICD-10-CM | POA: Diagnosis not present

## 2021-12-31 DIAGNOSIS — I5032 Chronic diastolic (congestive) heart failure: Secondary | ICD-10-CM | POA: Insufficient documentation

## 2021-12-31 DIAGNOSIS — E860 Dehydration: Secondary | ICD-10-CM

## 2021-12-31 DIAGNOSIS — Z79899 Other long term (current) drug therapy: Secondary | ICD-10-CM

## 2021-12-31 MED ORDER — METOPROLOL SUCCINATE ER 25 MG PO TB24: 12.5000 mg | ORAL_TABLET | Freq: Every day | ORAL | 3 refills | Status: DC

## 2021-12-31 MED ORDER — AMIODARONE HCL 200 MG PO TABS: 200.0000 mg | ORAL_TABLET | Freq: Every day | ORAL | 0 refills | Status: DC

## 2021-12-31 MED ORDER — APIXABAN 2.5 MG PO TABS: 2.5000 mg | ORAL_TABLET | Freq: Two times a day (BID) | ORAL | 2 refills | Status: DC

## 2021-12-31 NOTE — Patient Instructions (Addendum)
Medication Instructions:    Change in medications --Take Amiodarone 200 mg daily for 2 weeks, then starting on 01/16/22 decrease to 100 mg ( 1/2 tablet of 200 mg ) daily      Stop taking Metoprolol tartrate twice a day  Start taking  Toprol XL ( metoprolol succinate) 12.5 mg  ( 1/2 tablet of 25 mg) opposite of the time you take your Amiodarone    Use your Lasix on as needed basis - if weight is 3 lbs  above normal weight or increase swelling or shortness of breath . Take no longer than 2- 3 days in a row then stop   *If you need a refill on your cardiac medications before your next appointment, please call your pharmacy*   Lab Work: Not needed    Testing/Procedures: Not needed    Follow-Up: At [facility], you and your health needs are our priority.  As part of our continuing mission to provide you with exceptional heart care, we have created designated Provider Care Teams.  These Care Teams include your primary Cardiologist (physician) and Advanced Practice Providers (APPs -  Physician Assistants and Nurse Practitioners) who all work together to provide you with the care you need, when you need it.     Your next appointment:   4 or 5 month(s)  The format for your next appointment:   In Person  Provider:   Glenetta Hew, MD    Other Instructions

## 2021-12-31 NOTE — Progress Notes (Addendum)
Primary Care Provider: Lavone Orn, [place] Cardiologist: Glenetta Hew, MD Electrophysiologist: None  Clinic Note: Chief Complaint  Patient presents with   Hospitalization Follow-up    Post Lap Chole for Choledocholithiasis - complicated by Afib RVR - Chem Cardioversion with Amiodarone Re-load.   Atrial Fibrillation    Seems to be maintaining NSR.   ===================================  ASSESSMENT/PLAN   Problem List Items Addressed This Visit       Cardiology Problems   Moderate to severe mitral regurgitation (Chronic)    Again, related to MVP and exacerbated by atrial fib.  Will readdress CVTS at next f/u depending on how well she is doing.       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  She does not seem to be interested in discussing open mitral valve repair.  As such, supportive care when trying to maintain sinus rhythm and treatment of CHF is the best we can do.  Does not meet criteria for SBE prophylaxis based on the guidelines.      Relevant Medications   metoprolol succinate (TOPROL XL) 25 MG 24 hr tablet   apixaban (ELIQUIS) 2.5 MG TABS tablet   amiodarone (PACERONE) 200 MG tablet   Paroxysmal atrial fibrillation (HCC) (Chronic)    So far she is maintaining sinus rhythm, but had relapse of A-fib during her recent hospitalization for choledocholithiasis. Currently on amiodarone load. ->  I am worried about her fatigue.  The plan will be to try to get her down to maintenance dose of amiodarone.  Will also convert from Lopressor to Toprol for ease of taking.  I think she is only taking it once a day anyway.  Plan: Take existing blood pressure once daily 2.5 mg and then  convert to Toprol 12.5 mg daily upon completion of current bottle. Reduce amiodarone to 200 mg daily for 2 weeks and then decrease to 100 mg daily (1/2 tablet) as of January 13. Continue Eliquis 2.5 mg twice daily  If maintaining NSR becomes very difficult, would consider referral to EP to discuss AV nodal ablation & CRT-P (HIS bundle Pacing) +/- Atrial Lead (using Amiodarone to maintain SR).       Relevant Medications   metoprolol succinate (TOPROL XL) 25 MG 24 hr tablet   apixaban (ELIQUIS) 2.5 MG TABS tablet   amiodarone (PACERONE) 200 MG tablet   Hypercoagulable state due to paroxysmal atrial fibrillation [facility]): CHA2DS2-VASc score 6 (Chronic)    On appropriate dose of Eliquis reduced to 2.5 g twice daily for weight. No major bleeding issues. Would prefer to avoid holding apixaban based on her recurrent paroxysms of A-fib.    At this point I think it probably best for her to be bridged for procedures or surgeries.      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 As long as she is in sinus, she seems to doing pretty well.  She has been PRN Lasix and we discussed that this  would be for her to take with 30 pound weight gain, or worsening edema, dyspnea.  Her blood pressure somewhat labile somewhat leery of afterload reduction      Relevant Medications   metoprolol succinate (TOPROL XL) 25 MG 24 hr tablet   apixaban (ELIQUIS) 2.5 MG TABS tablet   amiodarone (PACERONE) 200 MG tablet     Other   On amiodarone therapy (Chronic)    With plans for long-term amiodarone therapy, we will need to reassess LFTs & TFTs.  I think she needs to be on amiodarone, therefore as long as she is not hyperthyroid, we will continue and supplement thyroid if  necessary.  She will need annual eye exams And then PFTs, will check intermittent ESR and CRP for measurements of inflammation suggesting pulmonary toxicity.      Fatigue due to treatment (Chronic)    She is quite fatigued and worn out just mildly because of her illness but also weight loss.  I am concerned about the high dose of amiodarone.  Plan: Weaning beta-blocker down to Toprol 12.5 mg daily and slowly wean down to 100 mg amiodarone daily.      Dehydration (Chronic)    Needs to maintain adequate hydration.  She is on PRN Lasix, but does not seem to be volume overloaded.       ===================================  HPI:    Lisa Lang is a 88 y.o. female with a PMH below who presents today for hospital follow-up..  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 Burt Knack (Structural Heart Team) - NOT good candidate for  E-Clip PAF very symptomatic regardless of rate (recently started rhythm control with amiodarone, on Eliquis 2.5 mg twice daily); CHA2DS2-VASc score 6 (Age 88, Female, HTN, Ao Plaque, CHF) TEE-DCCV 10/25 - on Amiodarone.  => Back in Afib as of 11/02/2021 Reloaded on Amiodarone => DCCV 11/06/2021 HFpEF 2/2 Valular CM -mostly symptomatic when in A-fib. No on PRN dose of Lasix.  RBBB HLD, HTN Recent Lap Chole 2/2 Choledocholithiasis  CLYDE UPSHAW was last seen on November 12, 2021 in the A-fib clinic after successful DCCV to sinus rhythm.  Was reducing her amiodarone to 200 mg daily.  Tolerating well.  No major symptoms.  On appropriate dose of Eliquis 2.5 mg twice daily.  Recent Hospitalizations:  11/20/2021: Choledocholithiasis, Acute Cholecystitis-s/p ERCP (11/20) and Lap [place] (11/21) Followed by Cardiology-Dr. Marlou Porch => was continued on metoprolol 12.5 mg twice daily, had been on IV amiodarone perioperatively due to A-fib RVR.  Converted back to p.o. amnio after [facility] CV.  Resumed Eliquis.  Felt to be low risk for surgery. D/c on PRN  Lasix. Was d/c on Amiodarone 200 mg BID (with plans to follow instructions from October 30 .  Reviewed  CV studies:    The following studies were reviewed today: (if available, images/films reviewed: From Epic Chart or Care Everywhere) TTE 10/25/2021: EF 65 to 70%.  No RWMA.  Moderate elevated PAP estimated 58 mmHg.  Moderate LA and RA dilation.  Myxomatous MV with bileaflet prolapse.  Moderate posterior MR.  Moderate MAC.  Moderate TR.  Moderate aortic calcification with no stenosis.  TEE / DCCV 10/28/2021: LVEF 65-70%. No RWMA. Mod LAD Dilation with no LA or LAA thrombus. Normal RV.  Mod RA dilation.  Myxomatous MV with large overriding AML & diminutive PML -bileaflet prolapse with large posterior directed jet and small anterior jet.  Moderate MR (EROA posterior to 0.2 cm.).  No systolic flow  reversal with PV.  Mild-MOd TR. AoV sclerosis without stenosis. =>  Following TEE, the patient underwent successful DCCV with 150J x1. NOT candidate for MITRAL CLIP.   Interval History:   RIKAYLA DEMMON returns here today postop cholecystectomy feeling quite weak but she has not had any spells where she fell as she went back in A-fib.  Her blood pressure is somewhat labile this morning was 105/60 and is 151/75 today.  Mostly worse has been noticing his system fatigue and feeling weak postoperatively which is not unexpected.  Her appetite is sort of going back and she is having some loose stools.  She did not have any orthopnea or PND no edema but has had some exertional dyspnea mostly because of fatigue.  She has had some nausea but no vomiting.  No chest pain or pressure at rest or exertion.  No syncope or near syncope.  No TIA or amaurosis fugax.  REVIEWED OF SYSTEMS   Review of Systems  Constitutional:  Positive for malaise/fatigue and weight loss (She lost a lot of weight in the hospital.).  HENT:  Negative for congestion.   Respiratory:  Negative for shortness of breath (Not since discharge).    Cardiovascular:  Negative for leg swelling.  Gastrointestinal:  Positive for abdominal pain and nausea. Negative for blood in stool, melena and vomiting.  Genitourinary:  Negative for hematuria.  Musculoskeletal:  Negative for joint pain and myalgias.  Neurological:  Positive for dizziness (Occasionally gets orthostatic symptoms.) and weakness (Generalized). Negative for focal weakness.  Psychiatric/Behavioral:  Negative for depression and memory loss. The patient is not nervous/anxious and does not have insomnia.     I have reviewed and (if needed) personally updated the patient's problem list, medications, allergies, past medical and surgical history, social and family history.   PAST MEDICAL HISTORY   Past Medical History:  Diagnosis Date   Arthritis    Diverticulitis 1995   And 2007   Dysrhythmia    Hyperlipidemia LDL goal <100    Mitral valve prolapse    Bileaflet MVP with moderate MR.   PAF (paroxysmal atrial fibrillation) ([place]) 11/2017   CHA2DS2 VASc score > 3 (Age >31 and female sex), --> Eliquis & Metoprolol     PAST SURGICAL HISTORY   Past Surgical History:  Procedure Laterality Date   CARDIOVERSION N/A 10/28/2021   Procedure: CARDIOVERSION;  Surgeon: Freada Bergeron, MD;  Location: [facility] ENDOSCOPY;  Service: Cardiovascular;  Laterality: N/A;   CARDIOVERSION N/A 11/06/2021   Procedure: CARDIOVERSION;  Surgeon: Werner Lean, MD;  Location: [facility] ENDOSCOPY;  Service: Cardiovascular;  Laterality: N/A;   CATARACT EXTRACTION, BILATERAL     CHOLECYSTECTOMY N/A 11/24/2021   Procedure: LAPAROSCOPIC CHOLECYSTECTOMY;  Surgeon: Armandina Gemma, MD;  Location: [facility] ORS;  Service: General;  Laterality: N/A;   ERCP N/A 11/23/2021   Procedure: ENDOSCOPIC RETROGRADE CHOLANGIOPANCREATOGRAPHY (ERCP);  Surgeon: Ronnette Juniper, MD;  Location: [facility] ENDOSCOPY;  Service: Gastroenterology;  Laterality: N/A;   REMOVAL OF STONES  11/23/2021   Procedure: REMOVAL OF STONES;  Surgeon: Ronnette Juniper, MD;  Location: [facility] ENDOSCOPY;  Service: Gastroenterology;;   Joan Mayans  11/23/2021   Procedure: Joan Mayans;  Surgeon: Ronnette Juniper, MD;  Location: [facility] ENDOSCOPY;  Service: Gastroenterology;;   TEE WITHOUT CARDIOVERSION N/A 10/28/2021   Procedure: TRANSESOPHAGEAL ECHOCARDIOGRAM (TEE) - SYNCHRONIZED DIRECT CURRENT CARDIOVERSION (DCCV);  Surgeon: Freada Bergeron, MD;  Location: [facility] ENDOSCOPY;  Service: CV;; LVEF 65-70%. No RWMA . Mod LA dilation w/o thrombus. Normal RV, Mod RA dilation.  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 Bileaflet Prolapse w/ Large Post & small Ant Jet.= 3+/Mod MR - no PVV reversal. = DCCV   TRANSTHORACIC ECHOCARDIOGRAM  10/25/2021   Notably he was very elderly woman.  No acute distress.  Just seems worn out.   TRANSTHORACIC ECHOCARDIOGRAM  12/12/2019    EF 60 to 65%.  No R WMA.  Unable to assess diastolic pressures.  Normal RV pressures.  Mild biatrial enlargement.  Moderate aortic calcification - no AS.  Severe BI-Leaflet MVP with Mod-Severe MR    Immunization History  Administered Date(s) Administered   Influenza, High Dose Seasonal PF 10/09/2018   PFIZER(Purple Top)SARS-COV-2 Vaccination 01/25/2019, 02/15/2019    MEDICATIONS/ALLERGIES   Current Meds  Medication Sig   acetaminophen (TYLENOL) 325 MG tablet Take 2 tablets (650 mg total) by mouth every 4 (four) hours as needed.   amiodarone (PACERONE) 200 MG tablet Take  200 mg  tablet  twice a day or as directed ( follow instruction from 11/02/21); (At the time of Clinic Visit - she was not sure if she was taking BID or once Daily)   apixaban (ELIQUIS) 2.5 MG TABS tablet Take 1 tablet (2.5 mg total) by mouth 2 (two) times daily.   atorvastatin (LIPITOR) 40 MG tablet Take 40 mg by mouth at bedtime.   calcium carbonate (OS-CAL - DOSED IN MG OF ELEMENTAL CALCIUM) 1250 MG tablet Take 1 tablet by mouth daily with breakfast.   furosemide (LASIX) 20 MG tablet Take 1 tablet (20 mg total) by mouth  daily as needed.   metoprolol tartrate (LOPRESSOR) 25 MG tablet Take 0.5 tablets (12.5 mg total) by mouth 2 (two) times daily.   Multiple Vitamins-Minerals (MULTIVITAMIN PO) Take 1 tablet by mouth daily.   oxyCODONE (OXY IR/ROXICODONE) 5 MG immediate release tablet Take 0.5 tablets (2.5 mg total) by mouth every 6 (six) hours as needed for severe pain.   Vitamin D, Ergocalciferol, 2000 units CAPS Take 2,000 Units by mouth daily.    Allergies  Allergen Reactions   Ciprofloxacin Diarrhea   Sulfa Antibiotics     unknown   Amoxicillin Rash   Lidocaine Palpitations    unknown    SOCIAL HISTORY/FAMILY HISTORY   Reviewed in Epic:  Pertinent findings:  Social History   Tobacco Use   Smoking status: Never   Smokeless tobacco: Never  Vaping Use   Vaping Use: Never used  Substance Use Topics   Alcohol use: No   Drug use: No   Social History   Social History Narrative   Recently widowed November 2019--she had been the primary caregiver for her husband who had progressively worsening dementia.   Now lives alone and is planning to move into a retirement community potentially.  She handled his death well, and felt somewhat liberated having the stress of being caregiver removed.    OBJCTIVE -PE, EKG, labs   Wt Readings from Last 3 Encounters:  01/06/22 97 lb 3.2 oz (44.1 kg)  12/31/21 102 lb 6.4 oz (46.4 kg)  11/25/21 128 lb 12 oz (58.4 kg)    Physical Exam: BP (!) 151/75   Pulse 69   Ht _0  (1.575 m)   Wt 102 lb 6.4 oz (46.4 kg)   SpO2 91%   BMI 18.73 kg/m  Physical Exam Vitals (This morning BP is 105/60.  On recheck was 146 /74) reviewed.  Constitutional:      General: She is not in acute distress.    Appearance: She  is not toxic-appearing.     Comments: Notably thinner, frail elderly woman (normal weight loss).  No acute distress.  Just seems worn out.  HENT:     Head: Normocephalic and atraumatic.     Comments: Mild temporal wasting Neck:     Vascular: No carotid  bruit or JVD.  Cardiovascular:     Rate and Rhythm: Normal rate and regular rhythm. No extrasystoles are present.    Chest Wall: PMI is not displaced (Hyperdynamic precordium).     Pulses: Decreased pulses (Faint, but palpable).     Heart sounds: S1 normal and S2 normal. A midsystolic click. Murmur heard.     Medium-pitched blowing decrescendo mid to late systolic murmur is present with a grade of 3/6 at the apex radiating to the back.     High-pitched harsh crescendo-decrescendo early systolic murmur is also present at the upper right sternal border radiating to the neck.     No gallop.  Pulmonary:     Effort: Pulmonary effort is normal. No respiratory distress.     Breath sounds: Normal breath sounds. No wheezing, rhonchi or rales.  Chest:     Chest wall: No tenderness.  Musculoskeletal:        General: No swelling (Trivial ankle). Normal range of motion.     Cervical back: Normal range of motion and neck supple.  Skin:    General: Skin is warm.     Coloration: Skin is not pale.  Neurological:     General: No focal deficit present.     Mental Status: She is alert and oriented to person, place, and time.  Psychiatric:        Mood and Affect: Mood normal.        Behavior: Behavior normal.     Comments: She seems very tired, timid; seems worn out.      Adult ECG Report Not checked  Recent Labs:  reviewed.  03/12/2021: TC 239, TG 152, HDL 54, LDL 158 Lab Results  Component Value Date   CREATININE 0.64 11/25/2021   BUN 21 11/25/2021   NA 142 11/25/2021   K 4.1 11/25/2021   CL 117 (H) 11/25/2021   CO2 18 (L) 11/25/2021      Latest Ref Rng & Units 11/25/2021    4:57 AM 11/24/2021    4:50 AM 11/23/2021    5:38 AM  CBC  WBC 4.0 - 10.5 K/uL 9.1  13.3  20.2   Hemoglobin 12.0 - 15.0 g/dL 12.6  12.9  14.3   Hematocrit 36.0 - 46.0 % 40.6  40.5  45.0   Platelets 150 - 400 K/uL 197  192  183     No results found for: "HGBA1C" Lab Results  Component Value Date   TSH 3.100  10/24/2021    ================================================== I spent a total of 22 minutes with the patient spent in direct patient consultation.  Additional time spent with chart review  / charting (studies, outside notes, etc): 20 min Total Time: 42 min  Current medicines are reviewed at length with the patient today.  (+/- concerns) none  Notice: This dictation was prepared with Dragon dictation along with smart phrase technology. Any transcriptional errors that result from this process are unintentional and may not be corrected upon review.  Studies Ordered:   No orders of the defined types were placed in this encounter.  Meds ordered this encounter  Medications   DISCONTD: amiodarone (PACERONE) 200 MG tablet    Sig: Take 1 tablet (  200 mg total) by mouth daily. Or as directed    Dispense:  180 tablet    Refill:  0   metoprolol succinate (TOPROL XL) 25 MG 24 hr tablet    Sig: Take 0.5 tablets (12.5 mg total) by mouth at bedtime.    Dispense:  45 tablet    Refill:  3   apixaban (ELIQUIS) 2.5 MG TABS tablet    Sig: Take 1 tablet (2.5 mg total) by mouth 2 (two) times daily.    Dispense:  180 tablet    Refill:  2   amiodarone (PACERONE) 200 MG tablet    Sig: Take 0.5 tablets (100 mg total) by mouth daily. ( Starting on 01/16/22)    Dispense:  180 tablet    Refill:  0    Patient Instructions / Medication Changes & Studies & Tests Ordered   Patient Instructions  Medication Instructions:    Change in medications --Take Amiodarone 200 mg daily for 2 weeks, then starting on 01/16/22 decrease to 100 mg ( 1/2 tablet of 200 mg ) daily      Stop taking Metoprolol tartrate twice a day  Start taking  Toprol XL ( metoprolol succinate) 12.5 mg  ( 1/2 tablet of 25 mg) opposite of the time you take your Amiodarone    Use your Lasix on as needed basis - if weight is 3 lbs  above normal weight or increase swelling or shortness of breath . Take no longer than 2- 3 days in a row then stop    *If you need a refill on your cardiac medications before your next appointment, please call your pharmacy*   Lab Work: Not needed    Testing/Procedures: Not needed    Follow-Up: At [facility], you and your health needs are our priority.  As part of our continuing mission to provide you with exceptional heart care, we have created designated Provider Care Teams.  These Care Teams include your primary Cardiologist (physician) and Advanced Practice Providers (APPs -  Physician Assistants and Nurse Practitioners) who all work together to provide you with the care you need, when you need it.     Your next appointment:   4 or 5 month(s)  The format for your next appointment:   In Person  Provider:   Glenetta Hew, MD    Other Instructions      Leonie Man, MD, MS Glenetta Hew, M.D., M.S. Interventional Cardiologist  [facility]  Pager # [phone] Phone # [phone] [address]. [place], [place] [address]   Thank you for choosing [facility]!!

## 2022-01-01 ENCOUNTER — Telehealth: Payer: Self-pay | Admitting: Cardiology

## 2022-01-01 MED ORDER — AMIODARONE HCL 200 MG PO TABS: 100.0000 mg | ORAL_TABLET | Freq: Every day | ORAL | 0 refills | Status: DC

## 2022-01-01 NOTE — Telephone Encounter (Signed)
Called patient . Informed patient of updated  instructions.  Take Amiodarone 200 mg daily for 2 weeks the decrease to 100 mg ( 1/2 tablet of 200 mg ) daily     Patient stated she wrote the direction down and verbalized understanding   Will change direction on medication list and after summary visit

## 2022-01-01 NOTE — Telephone Encounter (Signed)
Called patient . Verified again . Patient states she has been taking 200 mg twice a day .  RN informed patient to  decrease to 200 mg daily  as Dr Ellyn Hack  verbal ordered on 12/31/21 office appointment .

## 2022-01-01 NOTE — Telephone Encounter (Signed)
Pt c/o medication issue:  1. Name of Medication: amiodarone (PACERONE) 200 MG tablet   2. How are you currently taking this medication (dosage and times per day)? Take 1 tablet by mouth twice daily.  3. Are you having a reaction (difficulty breathing--STAT)?   4. What is your medication issue? Patient is calling to let Ivin Booty know what her medication bottle has written at home.  It has to take 1 tablet by mouth twice daily.

## 2022-01-01 NOTE — Telephone Encounter (Signed)
Routing to RN to make aware.   Thanks!

## 2022-01-01 NOTE — Telephone Encounter (Signed)
RN  reviewed with Dr Ellyn Hack. Concerning patient comments.   Per Dr Ellyn Hack - lets do 200 q d x 2 weeks then decrease to 100 q day   RN  will call patient update her on new instructions

## 2022-01-06 ENCOUNTER — Inpatient Hospital Stay (HOSPITAL_COMMUNITY)
Admission: RE | Admit: 2022-01-06 | Discharge: 2022-01-06 | Disposition: A | Discharge status: Home / Self Care | Payer: Medicare Other | Source: Ambulatory Visit | Attending: Nurse Practitioner | Admitting: Nurse Practitioner

## 2022-01-06 ENCOUNTER — Ambulatory Visit (HOSPITAL_COMMUNITY)
Admission: RE | Admit: 2022-01-06 | Discharge: 2022-01-06 | Disposition: A | Discharge status: Home / Self Care | Payer: Medicare Other | Source: Ambulatory Visit | Attending: Nurse Practitioner | Admitting: Nurse Practitioner

## 2022-01-06 ENCOUNTER — Telehealth: Payer: Self-pay | Admitting: Cardiology

## 2022-01-06 VITALS — BP 134/60 | HR 77 | Ht 62.0 in | Wt 97.2 lb

## 2022-01-06 DIAGNOSIS — I341 Nonrheumatic mitral (valve) prolapse: Secondary | ICD-10-CM | POA: Insufficient documentation

## 2022-01-06 DIAGNOSIS — I4819 Other persistent atrial fibrillation: Secondary | ICD-10-CM | POA: Diagnosis not present

## 2022-01-06 DIAGNOSIS — I480 Paroxysmal atrial fibrillation: Secondary | ICD-10-CM

## 2022-01-06 DIAGNOSIS — I4510 Unspecified right bundle-branch block: Secondary | ICD-10-CM | POA: Diagnosis not present

## 2022-01-06 DIAGNOSIS — E785 Hyperlipidemia, unspecified: Secondary | ICD-10-CM | POA: Diagnosis not present

## 2022-01-06 DIAGNOSIS — R0609 Other forms of dyspnea: Secondary | ICD-10-CM | POA: Diagnosis not present

## 2022-01-06 DIAGNOSIS — R0602 Shortness of breath: Secondary | ICD-10-CM | POA: Diagnosis not present

## 2022-01-06 DIAGNOSIS — Z7901 Long term (current) use of anticoagulants: Secondary | ICD-10-CM | POA: Diagnosis not present

## 2022-01-06 DIAGNOSIS — J9811 Atelectasis: Secondary | ICD-10-CM | POA: Diagnosis not present

## 2022-01-06 DIAGNOSIS — J811 Chronic pulmonary edema: Secondary | ICD-10-CM | POA: Diagnosis not present

## 2022-01-06 LAB — COMPREHENSIVE METABOLIC PANEL
ALT: 85 U/L — ABNORMAL HIGH (ref 0–44)
AST: 82 U/L — ABNORMAL HIGH (ref 15–41)
Albumin: 3.1 g/dL — ABNORMAL LOW (ref 3.5–5.0)
Alkaline Phosphatase: 153 U/L — ABNORMAL HIGH (ref 38–126)
Anion gap: 10 (ref 5–15)
BUN: 29 mg/dL — ABNORMAL HIGH (ref 8–23)
CO2: 27 mmol/L (ref 22–32)
Calcium: 9.9 mg/dL (ref 8.9–10.3)
Chloride: 101 mmol/L (ref 98–111)
Creatinine, Ser: 1.25 mg/dL — ABNORMAL HIGH (ref 0.44–1.00)
GFR, Estimated: 40 mL/min — ABNORMAL LOW (ref >60)
Glucose, Bld: 105 mg/dL — ABNORMAL HIGH (ref 70–99)
Potassium: 4.8 mmol/L (ref 3.5–5.1)
Sodium: 138 mmol/L (ref 135–145)
Total Bilirubin: 0.9 mg/dL (ref 0.3–1.2)
Total Protein: 6.7 g/dL (ref 6.5–8.1)

## 2022-01-06 LAB — CBC
HCT: 46.4 % — ABNORMAL HIGH (ref 36.0–46.0)
Hemoglobin: 15.3 g/dL — ABNORMAL HIGH (ref 12.0–15.0)
MCH: 32.1 pg (ref 26.0–34.0)
MCHC: 33 g/dL (ref 30.0–36.0)
MCV: 97.3 fL (ref 80.0–100.0)
Platelets: 328 10*3/uL (ref 150–400)
RBC: 4.77 MIL/uL (ref 3.87–5.11)
RDW: 15.6 % — ABNORMAL HIGH (ref 11.5–15.5)
WBC: 11 10*3/uL — ABNORMAL HIGH (ref 4.0–10.5)
nRBC: 0 % (ref 0.0–0.2)

## 2022-01-06 LAB — BRAIN NATRIURETIC PEPTIDE: B Natriuretic Peptide: 329.4 pg/mL — ABNORMAL HIGH (ref 0.0–100.0)

## 2022-01-06 LAB — TSH: TSH: 13.784 u[IU]/mL — ABNORMAL HIGH (ref 0.350–4.500)

## 2022-01-06 NOTE — Progress Notes (Addendum)
Primary Care Physician: Lavone Orn, MD Primary Cardiologist: Dr. Ellyn Hack Primary Electrophysiologist: none Referring Physician: Dr. Michail Sermon Lisa Lang is a 89 y.o. female with a history of paroxysmal  atrial fibrillation,RBBB, mitral valve prolapse with moderate to severe MR, HLD, arthritis, and PAF on Eliquis who was seen 10/24/2021 for the evaluation of afib with RVR  after presenting with symptoms of palpitations with  dyspnea .history as well of severe MVP with moderate to severe MR. This was reviewed with Dr. Burt Knack who was concerned about her anterior leaflet, which was extremely myxomatous/thickened. He was not confident that she would do well with Clip. She was admitted at [facility] from 10/21 to 10/29/21. She was treated with amiodarone gtts and had a successful cardioversion 10/25. 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  When she saw Dr. Ellyn Hack 10/30, she was back in afib. He increased amio load and planned on repeat cardioversion 11/3 and clinic was asked to see pt today prior to cardioversion. Her fluid status is stable. She is tolerating higher doses of amiodarone well. Her neighbor is with her today. She appears to feel well today and lives independently and up to a few weeks ago was driving.   F/u in afib clinic, 11/109/23. She had a successful cardioversion and remains in SR. She feels improved. She will lower amiodarone to 200 mg daily starting tomorrow. She is tolerating amiodarone well.   Pt is back in afib clinic, 01/06/22 for c/o of feeling short  of breath. Her amiodarone has been reduced to 100 mg daily since end of December. Her ekg shows sinus rhythm. She states that she feels fine sitting or lying down but short of breath with exertion. Her weight is down, no fluid observed. PO 94% on RA. No recent illness or respiratory  symptoms.    Today, she denies symptoms of palpitations, chest pain, shortness of breath, orthopnea, PND, lower extremity edema, dizziness, presyncope, syncope, snoring, daytime somnolence, bleeding, or neurologic sequela. The patient is tolerating medications without difficulties and is otherwise without complaint today.    Atrial Fibrillation Risk Factors:  she does not have symptoms or diagnosis of sleep apnea.  she does not have a history of rheumatic fever.  she does not have a history of alcohol use.  she has a BMI of Body mass index is 17.78 kg/m.[name] Kitchen Filed Weights   01/06/22 1313  Weight: 44.1 kg    LA size: 3.80   Atrial Fibrillation Management history:  Previous antiarrhythmic drugs: amiodarone   Previous cardioversions: 10/28/21  Previous ablations: none  CHADS2VASC score: 4  Anticoagulation history: eliquis 2.5 mg bid    Past Medical History:  Diagnosis Date   Arthritis    Diverticulitis 1995   And 2007   Dysrhythmia    Hyperlipidemia LDL goal <100    Mitral valve prolapse    Bileaflet MVP with moderate MR.   PAF (paroxysmal atrial fibrillation) ([place]) 11/2017   CHA2DS2 VASc score > 3 (Age >74 and female sex), --> Eliquis & Metoprolol    Past Surgical History:  Procedure Laterality Date   CARDIOVERSION N/A 10/28/2021   Procedure: CARDIOVERSION;  Surgeon: Freada Bergeron, MD;  Location: [facility] ENDOSCOPY;  Service: Cardiovascular;  Laterality: N/A;   CARDIOVERSION N/A 11/06/2021   Procedure: CARDIOVERSION;  Surgeon: Werner Lean, MD;  Location: [facility];  Service: Cardiovascular;  Laterality:  N/A;   CATARACT EXTRACTION, BILATERAL     CHOLECYSTECTOMY N/A 11/24/2021   Procedure: LAPAROSCOPIC CHOLECYSTECTOMY;  Surgeon: Armandina Gemma, MD;  Location: [facility] ORS;  Service: General;  Laterality: N/A;   ERCP N/A 11/23/2021   Procedure: ENDOSCOPIC RETROGRADE CHOLANGIOPANCREATOGRAPHY (ERCP);  Surgeon: Ronnette Juniper, MD;  Location: [facility] ENDOSCOPY;  Service:  Gastroenterology;  Laterality: N/A;   REMOVAL OF STONES  11/23/2021   Procedure: REMOVAL OF STONES;  Surgeon: Ronnette Juniper, MD;  Location: [facility] ENDOSCOPY;  Service: Gastroenterology;;   Joan Mayans  11/23/2021   Procedure: Joan Mayans;  Surgeon: Ronnette Juniper, MD;  Location: [facility] ENDOSCOPY;  Service: Gastroenterology;;   TEE WITHOUT CARDIOVERSION N/A 10/28/2021   Procedure: TRANSESOPHAGEAL ECHOCARDIOGRAM (TEE);  Surgeon: Freada Bergeron, MD;  Location: [facility] ENDOSCOPY;  Service: Cardiovascular;  Laterality: N/A;   TRANSTHORACIC ECHOCARDIOGRAM  3/'19, 9/'19   03/2017: Normal LVET (60-65%).  Gr 1 DD. Mild AI.  Mild MR with bileaflet MVP.  Mild pulm HTN; ECHO 09/27/2017: Normal LVEF w/o RWMA. Diastolic dysfxn with elevated LVEDP.  AoV sclerosis with Mod AI.  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  Severe RA dilation.     TRANSTHORACIC ECHOCARDIOGRAM  12/12/2019    EF 60 to 65%.  No R WMA.  Unable to assess diastolic pressures.  Normal RV pressures.  Mild biatrial enlargement.  Moderate aortic calcification - no AS.  Severe BI-Leaflet MVP with Mod-Severe MR    Current Outpatient Medications  Medication Sig Dispense Refill   acetaminophen (TYLENOL) 325 MG tablet Take 2 tablets (650 mg total) by mouth every 4 (four) hours as needed.     amiodarone (PACERONE) 200 MG tablet Take 0.5 tablets (100 mg total) by mouth daily. ( Starting on 01/16/22) 180 tablet 0   apixaban (ELIQUIS) 2.5 MG TABS tablet Take 1 tablet (2.5 mg total) by mouth 2 (two) times daily. 180 tablet 2   atorvastatin (LIPITOR) 40 MG tablet Take 40 mg by mouth at bedtime.     calcium carbonate (OS-CAL - DOSED IN MG OF ELEMENTAL CALCIUM) 1250 MG tablet Take 1 tablet by mouth daily with breakfast.     furosemide (LASIX) 20 MG tablet Take 1 tablet (20 mg total) by mouth daily as needed. 30 tablet 1   metoprolol succinate (TOPROL XL) 25 MG 24 hr tablet Take 0.5 tablets (12.5 mg total) by mouth at bedtime. 45 tablet  3   Multiple Vitamins-Minerals (MULTIVITAMIN PO) Take 1 tablet by mouth daily.     oxyCODONE (OXY IR/ROXICODONE) 5 MG immediate release tablet Take 0.5 tablets (2.5 mg total) by mouth every 6 (six) hours as needed for severe pain. 15 tablet 0   Vitamin D, Ergocalciferol, 2000 units CAPS Take 2,000 Units by mouth daily.     No current facility-administered medications for this encounter.    Allergies  Allergen Reactions   Ciprofloxacin Diarrhea   Sulfa Antibiotics     unknown   Amoxicillin Rash   Lidocaine Palpitations    unknown    Social History   Socioeconomic History   Marital status: Married    Spouse name: Not on file   Number of children: Not on file   Years of education: Not on file   Highest education level: Not on file  Occupational History   Occupation: Retired  Tobacco Use   Smoking status: Never   Smokeless tobacco: Never  Vaping Use   Vaping Use: Never used  Substance and Sexual Activity   Alcohol use: No  Drug use: No   Sexual activity: Not on file  Other Topics Concern   Not on file  Social History Narrative   Recently widowed November 2019--she had been the primary caregiver for her husband who had progressively worsening dementia.   Now lives alone and is planning to move into a retirement community potentially.  She handled his death well, and felt somewhat liberated having the stress of being caregiver removed.   Social Determinants of Health   Financial Resource Strain: Not on file  Food Insecurity: Not on file  Transportation Needs: Not on file  Physical Activity: Not on file  Stress: Not on file  Social Connections: Not on file  Intimate Partner Violence: Not on file    Family History  Problem Relation Age of Onset   Cervical cancer Mother    CAD Father    Lung cancer Sister    Stroke Neg Hx    Diabetes Neg Hx    The patient does not have a history of early familial atrial fibrillation or other arrhythmias.  ROS- All systems are  reviewed and negative except as per the HPI above.  Physical Exam: Vitals:   01/06/22 1313  BP: 134/60  Pulse: 77  SpO2: 94%  Weight: 44.1 kg  Height: '5\' 2"'$  (1.575 m)    GEN- The patient is well appearing, alert and oriented x 3 today.   Head- normocephalic, atraumatic Eyes-  Sclera clear, conjunctiva pink Ears- hearing intact Oropharynx- clear Neck- supple  Lungs- Clear to ausculation bilaterally, normal work of breathing Heart- Regular rate and rhythm, no murmurs, rubs or gallops  GI- soft, NT, ND, + BS Extremities- no clubbing, cyanosis, or edema MS- no significant deformity or atrophy Skin- no rash or lesion Psych- euthymic mood, full affect Neuro- strength and sensation are intact  Wt Readings from Last 3 Encounters:  01/06/22 44.1 kg  12/31/21 46.4 kg  11/25/21 58.4 kg    EKG  Vent. rate 77 BPM PR interval 228 ms QRS duration 110 ms QT/QTcB 408/461 ms P-R-T axes 83 -74 77 Sinus rhythm with 1st degree A-V block Left axis deviation Incomplete right bundle branch block Anterior infarct , age undetermined Abnormal ECG When compared with ECG of 21-Nov-2021 07:21, PREVIOUS ECG IS PRESENT  Epic records are reviewed at length today  Assessment and Plan:  1. Persistent  atrial fibrillation The patient had symptomatic persistent atrial fibrillation.  She had a successful cardioversion 11/06/21 .  The patients CHAD2VASC score is 4.  she is  appropriately anticoagulated at this time. Continue eliquis 2.5 mg daily  She is here today for feeling of dyspnea on exertion, thinking she was in afib but is in SR. Her weight is down from 58.4 kg to 44.1 kg today and does not appear to have extra fluid. No PND/orthopnea.  She seemed to have a worse day yesterday and felt  her heart pound yesterday so will place a 1 week zio patch. To assess for paroxysmal afib  CXR for feeling short of breath and will order a cmet/tsh/cbc/bnp today.  I will review with pt as when tests come in  for further treatment plans   Roderic Palau, NP 01/06/2022 2:05 PM

## 2022-01-06 NOTE — Telephone Encounter (Signed)
Patient c/o Palpitations:  High priority if patient c/o lightheadedness, shortness of breath, or chest pain  How long have you had palpitations/irregular HR/ Afib? Are you having the symptoms now? Yesterday; yes   Are you currently experiencing lightheadedness, SOB or CP? SOB, heart beating hard  Do you have a history of afib (atrial fibrillation) or irregular heart rhythm? Yes   Have you checked your BP or HR? (document readings if available): no    Are you experiencing any other symptoms? No

## 2022-01-06 NOTE — Telephone Encounter (Signed)
Thanks -- glad they could get her in quickly.  [place]

## 2022-01-06 NOTE — Telephone Encounter (Signed)
Spoke to patient she stated she has been in Afib since yesterday.Stated Dr.Harding told her to call office next time instead of going to hospital.Appointment scheduled with Afib clinic today 1/3 at 2:00 pm with Roderic Palau NP.Directions given.

## 2022-01-08 ENCOUNTER — Encounter: Payer: Self-pay | Admitting: Cardiology

## 2022-01-08 DIAGNOSIS — Z79899 Other long term (current) drug therapy: Secondary | ICD-10-CM | POA: Insufficient documentation

## 2022-01-08 NOTE — Assessment & Plan Note (Signed)
With plans for long-term amiodarone therapy, we will need to reassess LFTs & TFTs.  I think she needs to be on amiodarone, therefore as long as she is not hyperthyroid, we will continue and supplement thyroid if necessary.  She will need annual eye exams And then PFTs, will check intermittent ESR and CRP for measurements of inflammation suggesting pulmonary toxicity.

## 2022-01-08 NOTE — Assessment & Plan Note (Signed)
Unfortunately, reviewed by valve team and not felt to be favorable for MitraClip.  She does not seem to be interested in discussing open mitral valve repair.  As such, supportive care when trying to maintain sinus rhythm and treatment of CHF is the best we can do.  Does not meet criteria for SBE prophylaxis based on the guidelines.

## 2022-01-08 NOTE — Assessment & Plan Note (Signed)
Currently euvolemic on exam if anything somewhat malnourished and dehydrated. As long as she is in sinus, she seems to doing pretty well.  She has been PRN Lasix and we discussed that this would be for her to take with 30 pound weight gain, or worsening edema, dyspnea.  Her blood pressure somewhat labile somewhat leery of afterload reduction

## 2022-01-08 NOTE — Assessment & Plan Note (Signed)
On appropriate dose of Eliquis reduced to 2.5 g twice daily for weight. No major bleeding issues. Would prefer to avoid holding apixaban based on her recurrent paroxysms of A-fib.    At this point I think it probably best for her to be bridged for procedures or surgeries.

## 2022-01-08 NOTE — Assessment & Plan Note (Addendum)
So far she is maintaining sinus rhythm, but had relapse of A-fib during her recent hospitalization for choledocholithiasis. Currently on amiodarone load. ->  I am worried about her fatigue.  The plan will be to try to get her down to maintenance dose of amiodarone.  Will also convert from Lopressor to Toprol for ease of taking.  I think she is only taking it once a day anyway.  Plan: Take existing blood pressure once daily 2.5 mg and then convert to Toprol 12.5 mg daily upon completion of current bottle. Reduce amiodarone to 200 mg daily for 2 weeks and then decrease to 100 mg daily (1/2 tablet) as of January 13. Continue Eliquis 2.5 mg twice daily  If maintaining NSR becomes very difficult, would consider referral to EP to discuss AV nodal ablation & CRT-P (HIS bundle Pacing) +/- Atrial Lead (using Amiodarone to maintain SR).

## 2022-01-08 NOTE — Assessment & Plan Note (Signed)
She is quite fatigued and worn out just mildly because of her illness but also weight loss.  I am concerned about the high dose of amiodarone.  Plan: Weaning beta-blocker down to Toprol 12.5 mg daily and slowly wean down to 100 mg amiodarone daily.

## 2022-01-08 NOTE — Assessment & Plan Note (Signed)
Again, related to MVP and exacerbated by atrial fib.  Will readdress CVTS at next f/u depending on how well she is doing.

## 2022-01-08 NOTE — Assessment & Plan Note (Signed)
Needs to maintain adequate hydration.  She is on PRN Lasix, but does not seem to be volume overloaded.

## 2022-01-11 DIAGNOSIS — R197 Diarrhea, unspecified: Secondary | ICD-10-CM | POA: Diagnosis not present

## 2022-01-11 DIAGNOSIS — R945 Abnormal results of liver function studies: Secondary | ICD-10-CM | POA: Diagnosis not present

## 2022-01-11 DIAGNOSIS — K921 Melena: Secondary | ICD-10-CM | POA: Diagnosis not present

## 2022-01-11 DIAGNOSIS — Z03818 Encounter for observation for suspected exposure to other biological agents ruled out: Secondary | ICD-10-CM | POA: Diagnosis not present

## 2022-01-11 DIAGNOSIS — K625 Hemorrhage of anus and rectum: Secondary | ICD-10-CM | POA: Diagnosis not present

## 2022-01-14 DIAGNOSIS — K625 Hemorrhage of anus and rectum: Secondary | ICD-10-CM | POA: Diagnosis not present

## 2022-01-14 DIAGNOSIS — R197 Diarrhea, unspecified: Secondary | ICD-10-CM | POA: Diagnosis not present

## 2022-01-14 DIAGNOSIS — R7989 Other specified abnormal findings of blood chemistry: Secondary | ICD-10-CM | POA: Diagnosis not present

## 2022-01-14 DIAGNOSIS — D72828 Other elevated white blood cell count: Secondary | ICD-10-CM | POA: Diagnosis not present

## 2022-01-15 ENCOUNTER — Ambulatory Visit (INDEPENDENT_AMBULATORY_CARE_PROVIDER_SITE_OTHER): Payer: Medicare Other | Admitting: Student

## 2022-01-15 ENCOUNTER — Other Ambulatory Visit: Payer: Self-pay | Admitting: Physician Assistant

## 2022-01-15 ENCOUNTER — Ambulatory Visit
Admission: RE | Admit: 2022-01-15 | Discharge: 2022-01-15 | Disposition: A | Discharge status: Home / Self Care | Payer: Medicare Other | Source: Ambulatory Visit | Attending: Physician Assistant | Admitting: Physician Assistant

## 2022-01-15 ENCOUNTER — Encounter: Payer: Self-pay | Admitting: General Practice

## 2022-01-15 ENCOUNTER — Other Ambulatory Visit (HOSPITAL_COMMUNITY): Payer: Self-pay | Admitting: Physician Assistant

## 2022-01-15 VITALS — BP 118/68 | HR 76 | Ht 62.0 in | Wt 94.2 lb

## 2022-01-15 DIAGNOSIS — I340 Nonrheumatic mitral (valve) insufficiency: Secondary | ICD-10-CM | POA: Insufficient documentation

## 2022-01-15 DIAGNOSIS — Z9049 Acquired absence of other specified parts of digestive tract: Secondary | ICD-10-CM

## 2022-01-15 DIAGNOSIS — I38 Endocarditis, valve unspecified: Secondary | ICD-10-CM

## 2022-01-15 DIAGNOSIS — I5032 Chronic diastolic (congestive) heart failure: Secondary | ICD-10-CM

## 2022-01-15 DIAGNOSIS — R7989 Other specified abnormal findings of blood chemistry: Secondary | ICD-10-CM | POA: Diagnosis not present

## 2022-01-15 DIAGNOSIS — R197 Diarrhea, unspecified: Secondary | ICD-10-CM

## 2022-01-15 DIAGNOSIS — I480 Paroxysmal atrial fibrillation: Secondary | ICD-10-CM | POA: Diagnosis not present

## 2022-01-15 DIAGNOSIS — I341 Nonrheumatic mitral (valve) prolapse: Secondary | ICD-10-CM | POA: Diagnosis not present

## 2022-01-15 DIAGNOSIS — R5383 Other fatigue: Secondary | ICD-10-CM

## 2022-01-15 DIAGNOSIS — R630 Anorexia: Secondary | ICD-10-CM | POA: Diagnosis not present

## 2022-01-15 DIAGNOSIS — R109 Unspecified abdominal pain: Secondary | ICD-10-CM | POA: Diagnosis not present

## 2022-01-15 NOTE — Progress Notes (Signed)
Cardiology Clinic Note   Patient Name: Lisa Lang Date of Encounter: 01/15/2022  Primary Care Provider:  Lavone Orn, MD Primary Cardiologist:  Glenetta Hew, MD  Patient Profile    Lisa Lang is a 89 y.o. female with a past medical history of PAF s/p cardioversion October and November 2023 on amiodarone and anticoagulation, moderate to severe MR/arrhythmogenic bileaflet mitral valve prolapse syndrome, chronic diastolic heart failure, hyperlipidemia  who presents to the clinic today for evaluation of increased DOE at the request of Roderic Palau, NP.   Past Medical History    Past Medical History:  Diagnosis Date   Arthritis    Diverticulitis 1995   And 2007   Dysrhythmia    Hyperlipidemia LDL goal <100    Mitral valve prolapse    Bileaflet MVP with moderate MR.   PAF (paroxysmal atrial fibrillation) ([place]) 11/2017   CHA2DS2 VASc score > 3 (Age >29 and female sex), --> Eliquis & Metoprolol    Past Surgical History:  Procedure Laterality Date   CARDIOVERSION N/A 10/28/2021   Procedure: CARDIOVERSION;  Surgeon: Freada Bergeron, MD;  Location: [facility] ENDOSCOPY;  Service: Cardiovascular;  Laterality: N/A;   CARDIOVERSION N/A 11/06/2021   Procedure: CARDIOVERSION;  Surgeon: Werner Lean, MD;  Location: [facility] ENDOSCOPY;  Service: Cardiovascular;  Laterality: N/A;   CATARACT EXTRACTION, BILATERAL     CHOLECYSTECTOMY N/A 11/24/2021   Procedure: LAPAROSCOPIC CHOLECYSTECTOMY;  Surgeon: Armandina Gemma, MD;  Location: [facility] ORS;  Service: General;  Laterality: N/A;   ERCP N/A 11/23/2021   Procedure: ENDOSCOPIC RETROGRADE CHOLANGIOPANCREATOGRAPHY (ERCP);  Surgeon: Ronnette Juniper, MD;  Location: [facility] ENDOSCOPY;  Service: Gastroenterology;  Laterality: N/A;   REMOVAL OF STONES  11/23/2021   Procedure: REMOVAL OF STONES;  Surgeon: Ronnette Juniper, MD;  Location: [facility] ENDOSCOPY;  Service: Gastroenterology;;   Joan Mayans  11/23/2021   Procedure: Joan Mayans;  Surgeon: Ronnette Juniper, MD;   Location: [facility] ENDOSCOPY;  Service: Gastroenterology;;   TEE WITHOUT CARDIOVERSION N/A 10/28/2021   Procedure: TRANSESOPHAGEAL ECHOCARDIOGRAM (TEE) - SYNCHRONIZED DIRECT CURRENT CARDIOVERSION (DCCV);  Surgeon: Freada Bergeron, MD;  Location: [facility] ENDOSCOPY;  Service: CV;; LVEF 65-70%. No RWMA . Mod LA dilation w/o thrombus. Normal RV, Mod RA dilation. 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 Bileaflet Prolapse w/ Large Post & small Ant Jet.= 3+/Mod MR - no PVV reversal. = DCCV   TRANSTHORACIC ECHOCARDIOGRAM  10/25/2021   Notably he was very elderly woman.  No acute distress.  Just seems worn out.   TRANSTHORACIC ECHOCARDIOGRAM  12/12/2019    EF 60 to 65%.  No R WMA.  Unable to assess diastolic pressures.  Normal RV pressures.  Mild biatrial enlargement.  Moderate aortic calcification - no AS.  Severe BI-Leaflet MVP with Mod-Severe MR    Allergies  Allergies  Allergen Reactions   Ciprofloxacin Diarrhea   Sulfa Antibiotics     unknown   Amoxicillin Rash   Lidocaine Palpitations    unknown    History of Present Illness    Lisa Lang has a past medical history of: PAF. 11/06/2021: Successful cardioversion. On amiodarone.  Chest xray 01/06/2022: Mild pulmonary edema and possible small pleural effusions. Mild cardiomegaly.  TSH 01/06/2022: 13.784. CMP 01/06/2022: AST 82, ALT 85.  BNP 01/06/2022: 329.4. Moderate to severe MR/chronic diastolic heart failure due to valvular disease. Echo 10/25/2021: EF 65 to 70%.  Moderately elevated pulmonary artery systolic pressure.  Moderately dilated bilateral atria.  The mitral valve is myxomatous there is prolapse of  both leaflets.  The posterior leaflet is diminutive and there is at least moderate posterior MR.  Severe prolapse of both leaflets of the mitral valve.  Moderate MAC.  Moderate TR.  Moderate calcification/sclerosis of the aortic valve without stenosis.  Mild to moderate AR. Reviewed by valve team and felt patient was not  favorable for MitraClip and she does not seem interested in discussing open MVR. Hyperlipidemia.  Lisa Lang has been followed by Dr. Ellyn Hack for PAF and MVP since 2019.  Patient was last seen the office by Dr. Ellyn Hack on 12/31/2021 for hospital follow-up.  At that time she had undergone cholecystectomy complicated by A-fib with RVR and started on amiodarone load.  She was successfully cardioverted on 11/06/2021.  At that visit patient was changed from Lopressor to Toprol 12.5 mg daily amiodarone decreased to 200 mg daily for 2 weeks and then to 100 mg daily as of 01/16/2022.  Continued on Eliquis 2.5 mg twice daily.  More recently patient called in on 01/06/2022 with complaints of A-fib.  She was given an appointment with A-fib clinic on the same day.  She saw Roderic Palau, NP with complaints of DOE.  EKG shows sinus rhythm.  She was euvolemic.  Patient had 1 week ZIO placed, chest x-ray, and labs (results as above).  Per Roderic Palau, NP: "Elevated TSH, just finished amio load and is now on 100 mg daily . Tsh will need to be rechecked in a few weeks to see if trending down. Elevated liver enzymes as well which will need to be rechecked. Sending to Dr. Allison Quarry  attention.  Lasix has been added daily for the next 5 days to address elevated BNP and pulmonary edema on CXR. Will need f/u in Dr. Allison Quarry office in the next 5-7 days."  Today, patient is accompanied by her neighbor Patty.  She reports continued DOE that occurs most of the time that she is exerting herself.  No shortness of breath at rest.  No palpitations or chest pain.  She continues to have decreased energy and decreased appetite.  She denies lower extremity edema, orthopnea, PND.  She completed 5 days of Lasix as instructed by Roderic Palau, NP.  Her weight is down 2 pounds.  She does not feel it has helped necessarily, however she reports DOE may be improving somewhat.  She has been dealing with a dry cough since she got out of the hospital  from her gallbladder surgery the end of November but that has finally resolved.  She reports diarrhea since her gallbladder surgery on 11/27/2021 initially she noticed blood in her stool and her Eliquis was stopped for 2 days and then restarted.  She has not had any blood in her stool since then.  Patient is very frustrated that her recovery from surgery has been so slow.  She lives independently with assistance from her neighbors if needed.   Home Medications    Current Meds  Medication Sig   acetaminophen (TYLENOL) 325 MG tablet Take 2 tablets (650 mg total) by mouth every 4 (four) hours as needed.   amiodarone (PACERONE) 200 MG tablet Take 0.5 tablets (100 mg total) by mouth daily. ( Starting on 01/16/22)   apixaban (ELIQUIS) 2.5 MG TABS tablet Take 1 tablet (2.5 mg total) by mouth 2 (two) times daily.   atorvastatin (LIPITOR) 40 MG tablet Take 40 mg by mouth at bedtime.   calcium carbonate (OS-CAL - DOSED IN MG OF ELEMENTAL CALCIUM) 1250 MG tablet Take 1 tablet by  mouth daily with breakfast.   furosemide (LASIX) 20 MG tablet Take 1 tablet (20 mg total) by mouth daily as needed.   metoprolol succinate (TOPROL XL) 25 MG 24 hr tablet Take 0.5 tablets (12.5 mg total) by mouth at bedtime.   Multiple Vitamins-Minerals (MULTIVITAMIN PO) Take 1 tablet by mouth daily.   pantoprazole (PROTONIX) 40 MG tablet Take 40 mg by mouth daily.   Vitamin D, Ergocalciferol, 2000 units CAPS Take 2,000 Units by mouth daily.    Family History    Family History  Problem Relation Age of Onset   Cervical cancer Mother    CAD Father    Lung cancer Sister    Stroke Neg Hx    Diabetes Neg Hx    She indicated that her mother is deceased. She indicated that her father is deceased. She indicated that her sister is deceased. She indicated that the status of her neg hx is unknown.   Social History    Social History   Socioeconomic History   Marital status: Married    Spouse name: Not on file   Number of  children: Not on file   Years of education: Not on file   Highest education level: Not on file  Occupational History   Occupation: Retired  Tobacco Use   Smoking status: Never   Smokeless tobacco: Never  Vaping Use   Vaping Use: Never used  Substance and Sexual Activity   Alcohol use: No   Drug use: No   Sexual activity: Not on file  Other Topics Concern   Not on file  Social History Narrative   Recently widowed November 2019--she had been the primary caregiver for her husband who had progressively worsening dementia.   Now lives alone and is planning to move into a retirement community potentially.  She handled his death well, and felt somewhat liberated having the stress of being caregiver removed.   Social Determinants of Health   Financial Resource Strain: Not on file  Food Insecurity: Not on file  Transportation Needs: Not on file  Physical Activity: Not on file  Stress: Not on file  Social Connections: Not on file  Intimate Partner Violence: Not on file     Review of Systems    General:  No chills, fever, night sweats. Positive for decreased appetite.  Cardiovascular:  No chest pain, edema, orthopnea, palpitations, paroxysmal nocturnal dyspnea. Positive for DOE.  Dermatological: No rash, lesions/masses Respiratory: No cough, dyspnea Urologic: No hematuria, dysuria Abdominal:   No nausea, vomiting, bright red blood per rectum, melena, or hematemesis. Improving diarrhea.  Neurologic:  No visual changes, weakness, changes in mental status. All other systems reviewed and are otherwise negative except as noted above.  Physical Exam    VS:  BP 118/68   Pulse 76   Ht _0  (1.575 m)   Wt 94 lb 3.2 oz (42.7 kg)   SpO2 96%   BMI 17.23 kg/m  , BMI Body mass index is 17.23 kg/m. GEN: Thin, well developed, in no acute distress. HEENT: Normal. Neck: Supple, no JVD, carotid bruits, or masses. Cardiac: RRR, no murmurs, rubs, or gallops. No clubbing, cyanosis, edema.   Radials/DP/PT 2+ and equal bilaterally.  Respiratory:  Respirations regular and unlabored, clear to auscultation bilaterally. GI: Soft, nontender, nondistended. MS: No deformity or atrophy. Skin: Warm and dry, no rash. Neuro: Strength and sensation are intact. Psych: Normal affect.  Accessory Clinical Findings    Recent Labs: 11/24/2021: Magnesium 2.0 01/06/2022: ALT 85; B  Natriuretic Peptide 329.4; BUN 29; Creatinine, Ser 1.25; Hemoglobin 15.3; Platelets 328; Potassium 4.8; Sodium 138; TSH 13.784   Recent Lipid Panel    Component Value Date/Time   CHOL 221 (H) 09/27/2017 0423   TRIG 66 09/27/2017 0423   HDL 56 09/27/2017 0423   CHOLHDL 3.9 09/27/2017 0423   VLDL 13 09/27/2017 0423   LDLCALC 152 (H) 09/27/2017 0423    ECG is not indicated today.   CHA2DS2-VASc Score = 4   This indicates a 4.8% annual risk of stroke. The patient's score is based upon: CHF History: 1 HTN History: 0 Diabetes History: 0 Stroke History: 0 Vascular Disease History: 0 Age Score: 2 Gender Score: 1      Assessment & Plan   Chronic diastolic heart failure due to valvular disease/moderate to severe MR. Patient thought she had come back into A-fib on 01/06/2022 and was able to be evaluated by Roderic Palau, NP in the A-fib clinic.  She was found to be in normal sinus rhythm.  At time she complained of increased DOE.  BNP 329.4.  Patient was started on a 5-day course of Lasix.  Her weight is down 2 pounds.  She denies lower extremity edema, abdominal bloating, or early satiety. Patient denies shortness of breath or dyspnea on exertion. No chest pain, pressure, or tightness. Denies lower extremity edema, orthopnea, or PND. No palpitations. Euvolemic and well compensated on exam.  Lungs are clear to auscultation bilaterally.  Repeat BMP today. Fatigue/decreased appetite.  Patient reports low energy and decreased appetite.  She lives alone and is independent with all personal hygiene, ADLs, driving, and  caring for her home.  Her neighbors assist her if necessary.  She is accompanied by her neighbor today.  Patient had a cholecystectomy on 16/10/9602 which was complicated by A-fib with RVR.  She was started on amiodarone at that time.  Amiodarone has since been decreased to 100 mg daily and Lopressor changed to Toprol 12.5 mg.  Discussed deconditioning secondary to surgery in November.  She is encouraged to advance her activities as tolerated. Suggested if fatigue persists to discuss PT with PCP.  Suggested Boost or Ensure to supplement as she builds back up her appetite. PAF.  CHA2DS2-VASc 4.  Patient is maintaining sinus rhythm with heart rate 76 bpm.  Denies palpitations.  She wore a ZIO monitor for 1 week.  She just mailed it in today.  Continue Toprol 12.5 mg daily Eliquis 2.5 mg twice daily (age >52, weight <60 kg).   Disposition: BMP today.  Return in 2 months or sooner as needed.   Justice Britain. Zniya Cottone, DNP, NP-C     01/15/2022, 2:39 PM [place] [address] Office [phone] Fax [phone]

## 2022-01-15 NOTE — Patient Instructions (Addendum)
Medication Instructions:   Your physician recommends that you continue on your current medications as directed. Please refer to the Current Medication list given to you today.  *If you need a refill on your cardiac medications before your next appointment, please call your pharmacy*  Lab Work: Your physician recommends that you return for lab work TODAY:  BMP  If you have labs (blood work) drawn today and your tests are completely normal, you will receive your results only by: [place] (if you have [place]) OR A paper copy in the mail If you have any lab test that is abnormal or we need to change your treatment, we will call you to review the results.  Testing/Procedures: NONE ordered at this time of appointment   Follow-Up: At [facility], you and your health needs are our priority.  As part of our continuing mission to provide you with exceptional heart care, we have created designated Provider Care Teams.  These Care Teams include your primary Cardiologist (physician) and Advanced Practice Providers (APPs -  Physician Assistants and Nurse Practitioners) who all work together to provide you with the care you need, when you need it.  Your next appointment:   2 month(s)  Provider:   Glenetta Hew, MD  or APP on a day Dr. Ellyn Hack is in the office     Other Instructions

## 2022-01-16 LAB — BASIC METABOLIC PANEL
BUN/Creatinine Ratio: 30 — ABNORMAL HIGH (ref 12–28)
BUN: 37 mg/dL — ABNORMAL HIGH (ref 10–36)
CO2: 22 mmol/L (ref 20–29)
Calcium: 9.8 mg/dL (ref 8.7–10.3)
Chloride: 99 mmol/L (ref 96–106)
Creatinine, Ser: 1.22 mg/dL — ABNORMAL HIGH (ref 0.57–1.00)
Glucose: 95 mg/dL (ref 70–99)
Potassium: 4.2 mmol/L (ref 3.5–5.2)
Sodium: 142 mmol/L (ref 134–144)
eGFR: 42 mL/min/{1.73_m2} — ABNORMAL LOW (ref >59)

## 2022-01-22 DIAGNOSIS — I480 Paroxysmal atrial fibrillation: Secondary | ICD-10-CM | POA: Diagnosis not present

## 2022-01-22 DIAGNOSIS — R7989 Other specified abnormal findings of blood chemistry: Secondary | ICD-10-CM | POA: Diagnosis not present

## 2022-01-22 DIAGNOSIS — R945 Abnormal results of liver function studies: Secondary | ICD-10-CM | POA: Diagnosis not present

## 2022-01-22 DIAGNOSIS — I341 Nonrheumatic mitral (valve) prolapse: Secondary | ICD-10-CM | POA: Diagnosis not present

## 2022-01-26 NOTE — Addendum Note (Signed)
Encounter addended by: Juluis Mire, RN on: 01/26/2022 11:10 AM  Actions taken: Imaging Exam ended

## 2022-01-27 ENCOUNTER — Encounter (HOSPITAL_COMMUNITY): Payer: Self-pay | Admitting: *Deleted

## 2022-02-01 ENCOUNTER — Other Ambulatory Visit: Payer: Self-pay | Admitting: Cardiology

## 2022-02-02 DIAGNOSIS — X32XXXD Exposure to sunlight, subsequent encounter: Secondary | ICD-10-CM | POA: Diagnosis not present

## 2022-02-02 DIAGNOSIS — L570 Actinic keratosis: Secondary | ICD-10-CM | POA: Diagnosis not present

## 2022-02-03 ENCOUNTER — Other Ambulatory Visit: Payer: Self-pay | Admitting: Cardiology

## 2022-02-05 DIAGNOSIS — R945 Abnormal results of liver function studies: Secondary | ICD-10-CM | POA: Diagnosis not present

## 2022-02-05 DIAGNOSIS — R7989 Other specified abnormal findings of blood chemistry: Secondary | ICD-10-CM | POA: Diagnosis not present

## 2022-02-09 DIAGNOSIS — S81811A Laceration without foreign body, right lower leg, initial encounter: Secondary | ICD-10-CM | POA: Diagnosis not present

## 2022-02-11 ENCOUNTER — Encounter (HOSPITAL_COMMUNITY): Payer: Self-pay | Admitting: *Deleted

## 2022-02-11 DIAGNOSIS — R749 Abnormal serum enzyme level, unspecified: Secondary | ICD-10-CM | POA: Diagnosis not present

## 2022-02-11 DIAGNOSIS — R7989 Other specified abnormal findings of blood chemistry: Secondary | ICD-10-CM | POA: Diagnosis not present

## 2022-02-11 DIAGNOSIS — R945 Abnormal results of liver function studies: Secondary | ICD-10-CM | POA: Diagnosis not present

## 2022-02-16 DIAGNOSIS — X32XXXD Exposure to sunlight, subsequent encounter: Secondary | ICD-10-CM | POA: Diagnosis not present

## 2022-02-16 DIAGNOSIS — L570 Actinic keratosis: Secondary | ICD-10-CM | POA: Diagnosis not present

## 2022-03-10 ENCOUNTER — Other Ambulatory Visit: Payer: Self-pay | Admitting: Cardiology

## 2022-03-11 NOTE — Telephone Encounter (Signed)
Prescription refill request for Eliquis received.  Indication: afib  Last office visit: 01/15/2022, [place]  Scr: 1.22, 01/15/2022 Age: 89 yo  Weight: 42.7 kg    Refill sent.

## 2022-03-15 DIAGNOSIS — M6281 Muscle weakness (generalized): Secondary | ICD-10-CM | POA: Diagnosis not present

## 2022-03-15 DIAGNOSIS — R945 Abnormal results of liver function studies: Secondary | ICD-10-CM | POA: Diagnosis not present

## 2022-03-15 DIAGNOSIS — E7800 Pure hypercholesterolemia, unspecified: Secondary | ICD-10-CM | POA: Diagnosis not present

## 2022-03-15 DIAGNOSIS — R269 Unspecified abnormalities of gait and mobility: Secondary | ICD-10-CM | POA: Diagnosis not present

## 2022-03-15 DIAGNOSIS — M810 Age-related osteoporosis without current pathological fracture: Secondary | ICD-10-CM | POA: Diagnosis not present

## 2022-03-15 DIAGNOSIS — Z0000 Encounter for general adult medical examination without abnormal findings: Secondary | ICD-10-CM | POA: Diagnosis not present

## 2022-03-15 DIAGNOSIS — I480 Paroxysmal atrial fibrillation: Secondary | ICD-10-CM | POA: Diagnosis not present

## 2022-03-15 DIAGNOSIS — Z1331 Encounter for screening for depression: Secondary | ICD-10-CM | POA: Diagnosis not present

## 2022-03-15 DIAGNOSIS — I341 Nonrheumatic mitral (valve) prolapse: Secondary | ICD-10-CM | POA: Diagnosis not present

## 2022-03-16 DIAGNOSIS — X32XXXD Exposure to sunlight, subsequent encounter: Secondary | ICD-10-CM | POA: Diagnosis not present

## 2022-03-16 DIAGNOSIS — S90819A Abrasion, unspecified foot, initial encounter: Secondary | ICD-10-CM | POA: Diagnosis not present

## 2022-03-16 DIAGNOSIS — L570 Actinic keratosis: Secondary | ICD-10-CM | POA: Diagnosis not present

## 2022-03-23 ENCOUNTER — Emergency Department (HOSPITAL_BASED_OUTPATIENT_CLINIC_OR_DEPARTMENT_OTHER)
Admission: EM | Admit: 2022-03-23 | Discharge: 2022-03-23 | Disposition: A | Discharge status: Home / Self Care | Payer: Medicare Other | Attending: Emergency Medicine | Admitting: Emergency Medicine

## 2022-03-23 ENCOUNTER — Other Ambulatory Visit: Payer: Self-pay

## 2022-03-23 ENCOUNTER — Emergency Department (HOSPITAL_BASED_OUTPATIENT_CLINIC_OR_DEPARTMENT_OTHER): Payer: Medicare Other

## 2022-03-23 DIAGNOSIS — S0081XA Abrasion of other part of head, initial encounter: Secondary | ICD-10-CM

## 2022-03-23 DIAGNOSIS — S0181XA Laceration without foreign body of other part of head, initial encounter: Secondary | ICD-10-CM

## 2022-03-23 DIAGNOSIS — R600 Localized edema: Secondary | ICD-10-CM | POA: Diagnosis not present

## 2022-03-23 DIAGNOSIS — W01198A Fall on same level from slipping, tripping and stumbling with subsequent striking against other object, initial encounter: Secondary | ICD-10-CM | POA: Insufficient documentation

## 2022-03-23 DIAGNOSIS — Z79899 Other long term (current) drug therapy: Secondary | ICD-10-CM | POA: Diagnosis not present

## 2022-03-23 DIAGNOSIS — Z7901 Long term (current) use of anticoagulants: Secondary | ICD-10-CM | POA: Insufficient documentation

## 2022-03-23 DIAGNOSIS — M4312 Spondylolisthesis, cervical region: Secondary | ICD-10-CM | POA: Diagnosis not present

## 2022-03-23 DIAGNOSIS — W19XXXA Unspecified fall, initial encounter: Secondary | ICD-10-CM

## 2022-03-23 DIAGNOSIS — S0990XA Unspecified injury of head, initial encounter: Secondary | ICD-10-CM

## 2022-03-23 MED ORDER — DIPHENHYDRAMINE HCL 50 MG/ML IJ SOLN
25.0000 mg | Freq: Once | INTRAMUSCULAR | Status: AC
  Administered 2022-03-23: 25 mg via INTRAVENOUS
  Filled 2022-03-23: qty 1

## 2022-03-23 MED ORDER — LIDOCAINE-EPINEPHRINE-TETRACAINE (LET) TOPICAL GEL
3.0000 mL | Freq: Once | TOPICAL | Status: DC
  Filled 2022-03-23: qty 3

## 2022-03-23 NOTE — ED Provider Notes (Signed)
[facility] Provider Note   CSN: [id number] Arrival date & time: 03/23/22  1906     History {Add pertinent medical, surgical, social history, OB history to HPI:1} Chief Complaint  Patient presents with   Lisa Lang is a 89 y.o. female.  She has a history of A-fib and is on Eliquis.  She has what sounds like a mechanical fall bringing in the groceries.  She said she missed a step and fell down on the deck hitting her face.  No loss of consciousness.  Complaining of laceration of her forehead and abrasions of her nose.  She denies any neck or back pain no chest pain abdominal pain.  No numbness or weakness.  This is up-to-date.  The history is provided by the patient.  Head Injury Location:  Frontal Mechanism of injury: fall   Fall:    Fall occurred:  Walking Pain details:    Radiates to:  Face   Severity:  Mild   Progression:  Unchanged Relieved by:  None tried Worsened by:  Nothing Ineffective treatments:  None tried Associated symptoms: no difficulty breathing, no focal weakness, no loss of consciousness, no nausea, no neck pain and no vomiting        Home Medications Prior to Admission medications   Medication Sig Start Date End Date Taking? Authorizing Provider  acetaminophen (TYLENOL) 325 MG tablet Take 2 tablets (650 mg total) by mouth every 4 (four) hours as needed. 11/25/21   Dwyane Dee, MD  amiodarone (PACERONE) 200 MG tablet TAKE 1 TABLET BY MOUTH TWICE DAILY OR AS DIRECTED 02/03/22   Leonie Man, MD  apixaban (ELIQUIS) 2.5 MG TABS tablet TAKE 1 TABLET(2.5 MG) BY MOUTH TWICE DAILY 03/11/22   Leonie Man, MD  atorvastatin (LIPITOR) 40 MG tablet Take 40 mg by mouth at bedtime. 09/05/17   [provider]  calcium carbonate (OS-CAL - DOSED IN MG OF ELEMENTAL CALCIUM) 1250 MG tablet Take 1 tablet by mouth daily with breakfast.    [provider]  furosemide (LASIX) 20 MG tablet Take 1  tablet (20 mg total) by mouth daily as needed. 10/29/21 10/29/22  Cheryln Manly, NP  metoprolol succinate (TOPROL XL) 25 MG 24 hr tablet Take 0.5 tablets (12.5 mg total) by mouth at bedtime. 12/31/21   Leonie Man, MD  Multiple Vitamins-Minerals (MULTIVITAMIN PO) Take 1 tablet by mouth daily.    [provider]  oxyCODONE (OXY IR/ROXICODONE) 5 MG immediate release tablet Take 0.5 tablets (2.5 mg total) by mouth every 6 (six) hours as needed for severe pain. Patient not taking: Reported on 01/15/2022 11/25/21   Dwyane Dee, MD  pantoprazole (PROTONIX) 40 MG tablet Take 40 mg by mouth daily. 01/11/22   [provider]  Vitamin D, Ergocalciferol, 2000 units CAPS Take 2,000 Units by mouth daily.    [provider]      Allergies    Ciprofloxacin, Sulfa antibiotics, Amoxicillin, and Lidocaine    Review of Systems   Review of Systems  Constitutional:  Negative for fever.  Eyes:  Negative for visual disturbance.  Respiratory:  Negative for shortness of breath.   Cardiovascular:  Negative for chest pain.  Gastrointestinal:  Negative for nausea and vomiting.  Musculoskeletal:  Negative for back pain and neck pain.  Skin:  Positive for wound.  Neurological:  Negative for focal weakness and loss of consciousness.    Physical Exam Updated Vital Signs BP  130/60 (BP Location: Left Arm)   Pulse 73   Temp 97.8 F (36.6 C) (Oral)   Resp 18   Ht 5\' 2"  (1.575 m)   Wt 44 kg   SpO2 97%   BMI 17.74 kg/m  Physical Exam Vitals and nursing note reviewed.  Constitutional:      General: She is not in acute distress.    Appearance: Normal appearance. She is well-developed.  HENT:     Head: Normocephalic.     Comments: She has approximately 1 cm laceration left forehead.  She also has abrasions over her nose and upper lip.  No malocclusion. Eyes:     Conjunctiva/sclera: Conjunctivae normal.  Cardiovascular:     Rate and Rhythm: Normal rate and regular rhythm.      Heart sounds: No murmur heard. Pulmonary:     Effort: Pulmonary effort is normal. No respiratory distress.     Breath sounds: Normal breath sounds.  Abdominal:     Palpations: Abdomen is soft.     Tenderness: There is no abdominal tenderness. There is no guarding or rebound.  Musculoskeletal:        General: No deformity. Normal range of motion.     Cervical back: Neck supple.     Right lower leg: Edema present.     Left lower leg: Edema present.     Comments: She has full range of motion of her upper and lower extremities without significant tenderness or limitations.  Skin:    General: Skin is warm and dry.     Capillary Refill: Capillary refill takes less than 2 seconds.  Neurological:     General: No focal deficit present.     Mental Status: She is alert.     Sensory: No sensory deficit.     Motor: No weakness.     ED Results / Procedures / Treatments   Labs (all labs ordered are listed, but only abnormal results are displayed) Labs Reviewed - No data to display  EKG None  Radiology No results found.  Procedures .[name] KitchenLaceration Repair  Date/Time: 03/23/2022 8:31 PM  Performed by: Hayden Rasmussen, MD Authorized by: Hayden Rasmussen, MD   Consent:    Consent obtained:  Verbal   Consent given by:  Patient   Risks, benefits, and alternatives were discussed: yes     Risks discussed:  Infection, nerve damage, poor wound healing, pain, retained foreign body, tendon damage and vascular damage   Alternatives discussed:  No treatment, delayed treatment and referral Universal protocol:    Procedure explained and questions answered to patient or proxy's satisfaction: yes     Patient identity confirmed:  Verbally with patient Anesthesia:    Anesthesia method:  Local infiltration   Local anesthetic:  Diphenhydramine 1% Laceration details:    Location:  Face   Face location:  Forehead   Length (cm):  2 Pre-procedure details:    Preparation:  Patient was prepped and  draped in usual sterile fashion Treatment:    Area cleansed with:  Saline   Amount of cleaning:  Standard   Debridement:  None Skin repair:    Repair method:  Sutures   Suture size:  6-0   Suture material:  Prolene   Suture technique:  Simple interrupted   Number of sutures:  4 Approximation:    Approximation:  Close Repair type:    Repair type:  Simple Post-procedure details:    Dressing:  Bulky dressing   Procedure completion:  Tolerated well, no  immediate complications   {Document cardiac monitor, telemetry assessment procedure when appropriate:1}  Medications Ordered in ED Medications  lidocaine-EPINEPHrine-tetracaine (LET) topical gel (has no administration in time range)    ED Course/ Medical Decision Making/ A&P   {   Click here for ABCD2, HEART and other calculatorsREFRESH Note before signing :1}                          Medical Decision Making Amount and/or Complexity of Data Reviewed Radiology: ordered.  Risk Prescription drug management.   This patient complains of ***; this involves an extensive number of treatment Options and is a complaint that carries with it a high risk of complications and morbidity. The differential includes ***  I ordered, reviewed and interpreted labs, which included *** I ordered medication *** and reviewed PMP when indicated. I ordered imaging studies which included *** and I independently    visualized and interpreted imaging which showed *** Additional history obtained from *** Previous records obtained and reviewed *** I consulted *** and discussed lab and imaging findings and discussed disposition.  Cardiac monitoring reviewed, *** Social determinants considered, *** Critical Interventions: ***  After the interventions stated above, I reevaluated the patient and found *** Admission and further testing considered, ***   {Document critical care time when appropriate:1} {Document review of labs and clinical decision  tools ie heart score, Chads2Vasc2 etc:1}  {Document your independent review of radiology images, and any outside records:1} {Document your discussion with family members, caretakers, and with consultants:1} {Document social determinants of health affecting pt's care:1} {Document your decision making why or why not admission, treatments were needed:1} Final Clinical Impression(s) / ED Diagnoses Final diagnoses:  None    Rx / DC Orders ED Discharge Orders     None

## 2022-03-23 NOTE — ED Triage Notes (Signed)
Reports missing a step and falling down two steps on concrete. Denies LOC. Is on Eliquis. Laceration noted to forehead, bleeding controlled.

## 2022-03-23 NOTE — Discharge Instructions (Signed)
You are seen in the emergency department for evaluation of injuries from a fall.  Your CAT scan of your head and neck that did not show any significant traumatic findings.  You had a laceration of your forehead that was sutured and these will need to be removed in 5 to 7 days.  You are having some bleeding and bruising in that area but this should settle down.  You can use a cool compress to the area to limit bruising.  Soap and water.  Antibiotic ointment such as bacitracin or Neosporin.  Return to the emergency department if any worsening or concerning symptoms.

## 2022-03-23 NOTE — ED Notes (Signed)
Reviewed AVS/discharge instruction with patient. Time allotted for and all questions answered. Patient is agreeable for d/c and escorted to ed exit by staff.  

## 2022-03-25 NOTE — Progress Notes (Deleted)
Cardiology Clinic Note   Patient Name: Lisa Lang Date of Encounter: 03/25/2022  Primary Care Provider:  Lavone Orn, MD Primary Cardiologist:  Glenetta Hew, MD  Patient Profile    89 y.o. female with a past medical history of PAF s/p cardioversion CHADS VASC Score of 4 on Eliquis 2.5. October and November 2023 on amiodarone and anticoagulation, moderate to severe MR/arrhythmogenic bileaflet mitral valve prolapse syndrome, chronic diastolic heart failure, hyperlipidemia  Followed in the Afib clinic by Roderic Palau, NP. Last seen in the office by Mayra Reel, DNP for dyspnea and cough. Seen in ED on 03/23/2022 for mechanical fall.   Past Medical History    Past Medical History:  Diagnosis Date   Arthritis    Diverticulitis 1995   And 2007   Dysrhythmia    Hyperlipidemia LDL goal <100    Mitral valve prolapse    Bileaflet MVP with moderate MR.   PAF (paroxysmal atrial fibrillation) ([place]) 11/2017   CHA2DS2 VASc score > 3 (Age >2 and female sex), --> Eliquis & Metoprolol    Past Surgical History:  Procedure Laterality Date   CARDIOVERSION N/A 10/28/2021   Procedure: CARDIOVERSION;  Surgeon: Freada Bergeron, MD;  Location: [facility] ENDOSCOPY;  Service: Cardiovascular;  Laterality: N/A;   CARDIOVERSION N/A 11/06/2021   Procedure: CARDIOVERSION;  Surgeon: Werner Lean, MD;  Location: [facility] ENDOSCOPY;  Service: Cardiovascular;  Laterality: N/A;   CATARACT EXTRACTION, BILATERAL     CHOLECYSTECTOMY N/A 11/24/2021   Procedure: LAPAROSCOPIC CHOLECYSTECTOMY;  Surgeon: Armandina Gemma, MD;  Location: [facility] ORS;  Service: General;  Laterality: N/A;   ERCP N/A 11/23/2021   Procedure: ENDOSCOPIC RETROGRADE CHOLANGIOPANCREATOGRAPHY (ERCP);  Surgeon: Ronnette Juniper, MD;  Location: [facility] ENDOSCOPY;  Service: Gastroenterology;  Laterality: N/A;   REMOVAL OF STONES  11/23/2021   Procedure: REMOVAL OF STONES;  Surgeon: Ronnette Juniper, MD;  Location: [facility] ENDOSCOPY;  Service: Gastroenterology;;    Joan Mayans  11/23/2021   Procedure: Joan Mayans;  Surgeon: Ronnette Juniper, MD;  Location: [facility] ENDOSCOPY;  Service: Gastroenterology;;   TEE WITHOUT CARDIOVERSION N/A 10/28/2021   Procedure: TRANSESOPHAGEAL ECHOCARDIOGRAM (TEE) - SYNCHRONIZED DIRECT CURRENT CARDIOVERSION (DCCV);  Surgeon: Freada Bergeron, MD;  Location: [facility] ENDOSCOPY;  Service: CV;; LVEF 65-70%. No RWMA . Mod LA dilation w/o thrombus. Normal RV, Mod RA dilation. 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 Bileaflet Prolapse w/ Large Post & small Ant Jet.= 3+/Mod MR - no PVV reversal. = DCCV   TRANSTHORACIC ECHOCARDIOGRAM  10/25/2021   Notably he was very elderly woman.  No acute distress.  Just seems worn out.   TRANSTHORACIC ECHOCARDIOGRAM  12/12/2019    EF 60 to 65%.  No R WMA.  Unable to assess diastolic pressures.  Normal RV pressures.  Mild biatrial enlargement.  Moderate aortic calcification - no AS.  Severe BI-Leaflet MVP with Mod-Severe MR    Allergies  Allergies  Allergen Reactions   Ciprofloxacin Diarrhea   Sulfa Antibiotics     unknown   Amoxicillin Rash   Lidocaine Palpitations    unknown    History of Present Illness    ***  Home Medications    Current Outpatient Medications  Medication Sig Dispense Refill   acetaminophen (TYLENOL) 325 MG tablet Take 2 tablets (650 mg total) by mouth every 4 (four) hours as needed.     amiodarone (PACERONE) 200 MG tablet TAKE 1 TABLET BY MOUTH TWICE DAILY OR AS DIRECTED 90 tablet 0   apixaban (ELIQUIS) 2.5 MG TABS tablet TAKE 1  TABLET(2.5 MG) BY MOUTH TWICE DAILY 180 tablet 1   atorvastatin (LIPITOR) 40 MG tablet Take 40 mg by mouth at bedtime.     calcium carbonate (OS-CAL - DOSED IN MG OF ELEMENTAL CALCIUM) 1250 MG tablet Take 1 tablet by mouth daily with breakfast.     furosemide (LASIX) 20 MG tablet Take 1 tablet (20 mg total) by mouth daily as needed. 30 tablet 1   metoprolol succinate (TOPROL XL) 25 MG 24 hr tablet Take 0.5 tablets (12.5 mg  total) by mouth at bedtime. 45 tablet 3   Multiple Vitamins-Minerals (MULTIVITAMIN PO) Take 1 tablet by mouth daily.     oxyCODONE (OXY IR/ROXICODONE) 5 MG immediate release tablet Take 0.5 tablets (2.5 mg total) by mouth every 6 (six) hours as needed for severe pain. (Patient not taking: Reported on 01/15/2022) 15 tablet 0   pantoprazole (PROTONIX) 40 MG tablet Take 40 mg by mouth daily.     Vitamin D, Ergocalciferol, 2000 units CAPS Take 2,000 Units by mouth daily.     No current facility-administered medications for this visit.     Family History    Family History  Problem Relation Age of Onset   Cervical cancer Mother    CAD Father    Lung cancer Sister    Stroke Neg Hx    Diabetes Neg Hx    She indicated that her mother is deceased. She indicated that her father is deceased. She indicated that her sister is deceased. She indicated that the status of her neg hx is unknown.  Social History    Social History   Socioeconomic History   Marital status: Married    Spouse name: Not on file   Number of children: Not on file   Years of education: Not on file   Highest education level: Not on file  Occupational History   Occupation: Retired  Tobacco Use   Smoking status: Never   Smokeless tobacco: Never  Vaping Use   Vaping Use: Never used  Substance and Sexual Activity   Alcohol use: No   Drug use: No   Sexual activity: Not on file  Other Topics Concern   Not on file  Social History Narrative   Recently widowed November 2019--she had been the primary caregiver for her husband who had progressively worsening dementia.   Now lives alone and is planning to move into a retirement community potentially.  She handled his death well, and felt somewhat liberated having the stress of being caregiver removed.   Social Determinants of Health   Financial Resource Strain: Not on file  Food Insecurity: Not on file  Transportation Needs: Not on file  Physical Activity: Not on file   Stress: Not on file  Social Connections: Not on file  Intimate Partner Violence: Not on file     Review of Systems    General:  No chills, fever, night sweats or weight changes.  Cardiovascular:  No chest pain, dyspnea on exertion, edema, orthopnea, palpitations, paroxysmal nocturnal dyspnea. Dermatological: No rash, lesions/masses Respiratory: No cough, dyspnea Urologic: No hematuria, dysuria Abdominal:   No nausea, vomiting, diarrhea, bright red blood per rectum, melena, or hematemesis Neurologic:  No visual changes, wkns, changes in mental status. All other systems reviewed and are otherwise negative except as noted above.     Physical Exam    VS:  There were no vitals taken for this visit. , BMI There is no height or weight on file to calculate BMI.  GEN: Well nourished, well developed, in no acute distress. HEENT: normal. Neck: Supple, no JVD, carotid bruits, or masses. Cardiac: RRR, no murmurs, rubs, or gallops. No clubbing, cyanosis, edema.  Radials/DP/PT 2+ and equal bilaterally.  Respiratory:  Respirations regular and unlabored, clear to auscultation bilaterally. GI: Soft, nontender, nondistended, BS + x 4. MS: no deformity or atrophy. Skin: warm and dry, no rash. Neuro:  Strength and sensation are intact. Psych: Normal affect.  Accessory Clinical Findings    ECG personally reviewed by me today- *** - No acute changes  Lab Results  Component Value Date   WBC 11.0 (H) 01/06/2022   HGB 15.3 (H) 01/06/2022   HCT 46.4 (H) 01/06/2022   MCV 97.3 01/06/2022   PLT 328 01/06/2022   Lab Results  Component Value Date   CREATININE 1.22 (H) 01/15/2022   BUN 37 (H) 01/15/2022   NA 142 01/15/2022   K 4.2 01/15/2022   CL 99 01/15/2022   CO2 22 01/15/2022   Lab Results  Component Value Date   ALT 85 (H) 01/06/2022   AST 82 (H) 01/06/2022   ALKPHOS 153 (H) 01/06/2022   BILITOT 0.9 01/06/2022   Lab Results  Component Value Date   CHOL 221 (H) 09/27/2017    HDL 56 09/27/2017   LDLCALC 152 (H) 09/27/2017   TRIG 66 09/27/2017   CHOLHDL 3.9 09/27/2017    No results found for: "HGBA1C"  Review of Prior Studies: Echocardiogram 10/28/2021 1. Left ventricular ejection fraction, by estimation, is 65 to 70%. The  left ventricle has normal function.   2. Right ventricular systolic function is normal. The right ventricular  size is normal.   3. Left atrial size was moderately dilated. No left atrial/left atrial  appendage thrombus was detected.   4. Right atrial size was moderately dilated.   5. The mitral valve is myxomatous with a large overriding anterior mitral  valve leaflet and a diminutive posterior leaflet that measures about 8-53mm  in length. There is bileaflet prolapse with a larger posteriorly directed  jet and a smaller anteriorly  directed jet. Overall, mitral regurgitation appears moderate (EROA of  posterior jet 0.2cm2, RVol 54mL). There is no systolic flow reversal in  the pulmonary veins. PASP is 59mmHg +RAP. [name] Kitchen The mitral valve is  myxomatous. Moderate mitral valve regurgitation.   6. Tricuspid valve regurgitation is mild to moderate.   7. The aortic valve is tricuspid. There is mild calcification of the  aortic valve. There is mild thickening of the aortic valve. Aortic valve  regurgitation is mild. Aortic valve sclerosis/calcification is present,  without any evidence of aortic  stenosis.   8. Following TEE, the patient underwent successful DCCV with 150J x1.   FINDINGS   Left Ventricle: Left ventricular ejection fraction, by estimation, is 65  to 70%. The left ventricle has normal function. The left ventricular  internal cavity size was normal in size.   Zio Monitor 01/26/2022 Predominant rhythm was sinus rhythm Less than 1% ventricular and supraventricular ectopy No triggered episodes recorded Assessment & Plan   1.  ***    Current medicines are reviewed at length with the patient today.  I have spent *** min's   dedicated to the care of this patient on the date of this encounter to include pre-visit review of records, assessment, management and diagnostic testing,with shared decision making. Signed, Phill Myron. West Pugh, ANP, [place]   03/25/2022 3:37 PM      Office [phone] Fax [phone]  Notice: This dictation was prepared with Dragon dictation along with smaller phrase technology. Any transcriptional errors that result from this process are unintentional and may not be corrected upon review.

## 2022-03-26 ENCOUNTER — Ambulatory Visit: Payer: Medicare Other | Admitting: Adult Health

## 2022-03-29 DIAGNOSIS — S80811S Abrasion, right lower leg, sequela: Secondary | ICD-10-CM | POA: Diagnosis not present

## 2022-03-29 DIAGNOSIS — Z4802 Encounter for removal of sutures: Secondary | ICD-10-CM | POA: Diagnosis not present

## 2022-03-30 ENCOUNTER — Other Ambulatory Visit (HOSPITAL_COMMUNITY): Payer: Self-pay

## 2022-03-30 ENCOUNTER — Other Ambulatory Visit: Payer: Self-pay | Admitting: Cardiology

## 2022-03-31 DIAGNOSIS — M6281 Muscle weakness (generalized): Secondary | ICD-10-CM | POA: Diagnosis not present

## 2022-04-05 DIAGNOSIS — M6281 Muscle weakness (generalized): Secondary | ICD-10-CM | POA: Diagnosis not present

## 2022-04-07 DIAGNOSIS — M6281 Muscle weakness (generalized): Secondary | ICD-10-CM | POA: Diagnosis not present

## 2022-04-09 DIAGNOSIS — M6281 Muscle weakness (generalized): Secondary | ICD-10-CM | POA: Diagnosis not present

## 2022-04-12 DIAGNOSIS — M6281 Muscle weakness (generalized): Secondary | ICD-10-CM | POA: Diagnosis not present

## 2022-04-14 DIAGNOSIS — M6281 Muscle weakness (generalized): Secondary | ICD-10-CM | POA: Diagnosis not present

## 2022-04-15 DIAGNOSIS — R945 Abnormal results of liver function studies: Secondary | ICD-10-CM | POA: Diagnosis not present

## 2022-04-15 DIAGNOSIS — E7800 Pure hypercholesterolemia, unspecified: Secondary | ICD-10-CM | POA: Diagnosis not present

## 2022-04-16 DIAGNOSIS — M6281 Muscle weakness (generalized): Secondary | ICD-10-CM | POA: Diagnosis not present

## 2022-04-18 NOTE — Progress Notes (Unsigned)
Cardiology Clinic Note   Date: 04/21/2022 ID: Lisa Lang, DOB 1927-10-21, MRN [id number]  Primary Cardiologist:  Bryan Lemma, MD  Patient Profile    Lisa Lang is a 89 y.o. female who presents to the clinic today for 68-month follow-up.  Past medical history significant for: PAF. 11/06/2021: Successful cardioversion. On amiodarone.  Chest xray 01/06/2022: Mild pulmonary edema and possible small pleural effusions. Mild cardiomegaly.  TSH 01/06/2022: 13.784. CMP 01/06/2022: AST 82, ALT 85.  BNP 01/06/2022: 329.4. Event monitor 01/26/2022: Predominant rhythm was sinus.  Less than 1% ventricular and supraventricular ectopy.  No triggered episodes. Moderate to severe MR/chronic diastolic heart failure due to valvular disease. Echo 10/25/2021: EF 65 to 70%.  Moderately elevated pulmonary artery systolic pressure.  Moderately dilated bilateral atria.  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  The posterior leaflet is diminutive and there is at least moderate posterior MR.  Severe prolapse of both leaflets of the mitral valve.  Moderate MAC.  Moderate TR.  Moderate calcification/sclerosis of the aortic valve without stenosis.  Mild to moderate AR. Reviewed by valve team and felt patient was not favorable for MitraClip and she does not seem interested in discussing open MVR. Hyperlipidemia.   History of Present Illness    Lisa Lang has been followed by Dr. Herbie Baltimore for PAF and MVP since 2019.  Patient was last seen in the office by me on 01/15/2022.  At that time she reported continued DOE with no shortness of breath at rest.  She reported continued low energy and decreased appetite.  She reported after taking Lasix x 5 days per Rudi Coco, NP her weight was down 2 pounds and she felt her DOE was somewhat improved.  She was frustrated at her slow recovery after her gallbladder surgery November 2023.  Patient had BMP which showed normal electrolytes and continued low  kidney function.  She was instructed to work on increasing hydration.  Today, patient is alone today.  She is much improved from previous visit.  She has been attending physical therapy 3 times a week and feels she is very slowly getting stronger.  Energy is not where it was previously but is improved from last visit in January.  Appetite has improved as well.  Patient denies shortness of breath or dyspnea on exertion. No chest pain, pressure, or tightness. Denies orthopnea or PND. No palpitations.  Patient reports occasional lower extremity edema particularly when she is on her feet for extended periods of time.  Today she has bilateral lower extremity edema L>R.  She states at home she typically elevates her right leg but not her left.  She reports a recent mechanical fall 3 weeks ago when she was carrying groceries in from the car.  She states she took 2 bags at once and was not holding onto the rails as she went up the steps and fell forward hitting her head.  She suffered a laceration needing 4 sutures on her forehead and black eyes.  She has since started walking with a cane for stability.  He continues to live independently receiving help from her neighbors as needed.    ROS: All other systems reviewed and are otherwise negative except as noted in History of Present Illness.  Studies Reviewed    ECG is not ordered today.  Risk Assessment/Calculations     CHA2DS2-VASc Score = 4   This indicates a 4.8% annual risk of stroke. The patient's score is based upon:  CHF History: 1 HTN History: 0 Diabetes History: 0 Stroke History: 0 Vascular Disease History: 0 Age Score: 2 Gender Score: 1             Physical Exam    VS:  BP 120/60   Pulse 70   Ht  (1.575 m)   Wt 94 lb (42.6 kg)   SpO2 94%   BMI 17.19 kg/m  , BMI Body mass index is 17.19 kg/m.  GEN: Well nourished, well developed, in no acute distress. Neck: No JVD or carotid bruits. Cardiac:  RRR. 3/6 systolic murmur. No  rubs or gallops.   Respiratory:  Respirations regular and unlabored. Clear to auscultation without rales, wheezing or rhonchi. GI: Soft, nontender, nondistended. Extremities: Radials/DP/PT 2+ and equal bilaterally. No clubbing or cyanosis.  Mild edema L>R extremity edema. Skin: Warm and dry, no rash. Neuro: Strength intact.  Assessment & Plan   Chronic diastolic heart failure due to valvular disease/severe MVP/moderate MR/mild to moderate AI. Echo October 2023 showed EF 65 to 70%, moderately elevated pulmonary artery systolic pressure, moderate BAE, severe MVP with moderate MR, mild to moderate AI.  Patient denies DOE.  She has been working with physical therapy without dyspnea.  She has mild L>R extremity edema but otherwise euvolemic and well compensated on exam.  3/6 systolic murmur heard on exam today.  No Lasix recently.  Continue metoprolol and as needed Lasix. Fatigue/decreased appetite.  Patient reports low energy and decreased appetite.  She lives alone and is independent with all personal hygiene, ADLs, driving, and caring for her home.  Her neighbors assist her if necessary.  She is accompanied by her neighbor today.  Patient had a cholecystectomy on 11/27/2021 which was complicated by A-fib with RVR.  She was started on amiodarone at that time.  Amiodarone has since been decreased to 100 mg daily and Lopressor changed to Toprol 12.5 mg.  Patient reports fatigue has improved.  Her energy level is not where it was 6 months ago but is better than her last visit in January.  Appetite has also improved.  Patient is trying to gain the 10 pounds she lost since surgery back. PAF.  CHA2DS2-VASc 4.  Event monitor January 2024 showed predominant rhythm was sinus, less than 1% ventricular/supraventricular ectopy.  Patient denies palpitations.  No spontaneous bleeding concerns.  Regular rhythm auscultated today.  Continue Toprol, amiodarone, and Eliquis. Appropriate Eliquis dose.    Disposition: Patient  requested previously scheduled appointment with Dr. Herbie Baltimore coming up in a little over a week be canceled secondary to her visit today.  Patient to return in 6 months with Dr. Herbie Baltimore or sooner as needed.         Signed, Etta Grandchild. Catherine Cubero, DNP, NP-C

## 2022-04-19 DIAGNOSIS — M6281 Muscle weakness (generalized): Secondary | ICD-10-CM | POA: Diagnosis not present

## 2022-04-20 DIAGNOSIS — S80811D Abrasion, right lower leg, subsequent encounter: Secondary | ICD-10-CM | POA: Diagnosis not present

## 2022-04-20 DIAGNOSIS — X32XXXD Exposure to sunlight, subsequent encounter: Secondary | ICD-10-CM | POA: Diagnosis not present

## 2022-04-20 DIAGNOSIS — L570 Actinic keratosis: Secondary | ICD-10-CM | POA: Diagnosis not present

## 2022-04-21 ENCOUNTER — Encounter: Payer: Self-pay | Admitting: Student

## 2022-04-21 ENCOUNTER — Ambulatory Visit: Payer: Medicare Other | Attending: Adult Health | Admitting: Student

## 2022-04-21 VITALS — BP 120/60 | HR 70 | Ht 62.0 in | Wt 94.0 lb

## 2022-04-21 DIAGNOSIS — R5383 Other fatigue: Secondary | ICD-10-CM

## 2022-04-21 DIAGNOSIS — I351 Nonrheumatic aortic (valve) insufficiency: Secondary | ICD-10-CM

## 2022-04-21 DIAGNOSIS — I480 Paroxysmal atrial fibrillation: Secondary | ICD-10-CM | POA: Diagnosis not present

## 2022-04-21 DIAGNOSIS — I340 Nonrheumatic mitral (valve) insufficiency: Secondary | ICD-10-CM | POA: Diagnosis not present

## 2022-04-21 DIAGNOSIS — I341 Nonrheumatic mitral (valve) prolapse: Secondary | ICD-10-CM | POA: Diagnosis not present

## 2022-04-21 DIAGNOSIS — I38 Endocarditis, valve unspecified: Secondary | ICD-10-CM | POA: Diagnosis not present

## 2022-04-21 DIAGNOSIS — R630 Anorexia: Secondary | ICD-10-CM | POA: Insufficient documentation

## 2022-04-21 DIAGNOSIS — I5032 Chronic diastolic (congestive) heart failure: Secondary | ICD-10-CM | POA: Diagnosis not present

## 2022-04-21 NOTE — Patient Instructions (Signed)
Medication Instructions:  Your physician recommends that you continue on your current medications as directed. Please refer to the Current Medication list given to you today.  *If you need a refill on your cardiac medications before your next appointment, please call your pharmacy*   Lab Work: NONE If you have labs (blood work) drawn today and your tests are completely normal, you will receive your results only by: MyChart Message (if you have MyChart) OR A paper copy in the mail If you have any lab test that is abnormal or we need to change your treatment, we will call you to review the results.   Testing/Procedures: NONE   Follow-Up: At [facility], you and your health needs are our priority.  As part of our continuing mission to provide you with exceptional heart care, we have created designated Provider Care Teams.  These Care Teams include your primary Cardiologist (physician) and Advanced Practice Providers (APPs -  Physician Assistants and Nurse Practitioners) who all work together to provide you with the care you need, when you need it.  We recommend signing up for the patient portal called "MyChart".  Sign up information is provided on this After Visit Summary.  MyChart is used to connect with patients for Virtual Visits (Telemedicine).  Patients are able to view lab/test results, encounter notes, upcoming appointments, etc.  Non-urgent messages can be sent to your provider as well.   To learn more about what you can do with MyChart, go to [email or web address].    Your next appointment:   6 month(s)  Provider:   David Harding, MD    

## 2022-04-22 DIAGNOSIS — M6281 Muscle weakness (generalized): Secondary | ICD-10-CM | POA: Diagnosis not present

## 2022-04-26 DIAGNOSIS — M6281 Muscle weakness (generalized): Secondary | ICD-10-CM | POA: Diagnosis not present

## 2022-04-28 DIAGNOSIS — M6281 Muscle weakness (generalized): Secondary | ICD-10-CM | POA: Diagnosis not present

## 2022-04-29 DIAGNOSIS — T148XXA Other injury of unspecified body region, initial encounter: Secondary | ICD-10-CM | POA: Diagnosis not present

## 2022-04-30 DIAGNOSIS — M6281 Muscle weakness (generalized): Secondary | ICD-10-CM | POA: Diagnosis not present

## 2022-05-03 ENCOUNTER — Ambulatory Visit: Payer: Medicare Other | Admitting: Cardiology

## 2022-05-10 DIAGNOSIS — E349 Endocrine disorder, unspecified: Secondary | ICD-10-CM | POA: Diagnosis not present

## 2022-05-10 DIAGNOSIS — M8588 Other specified disorders of bone density and structure, other site: Secondary | ICD-10-CM | POA: Diagnosis not present

## 2022-05-10 DIAGNOSIS — N958 Other specified menopausal and perimenopausal disorders: Secondary | ICD-10-CM | POA: Diagnosis not present

## 2022-05-17 DIAGNOSIS — S81801D Unspecified open wound, right lower leg, subsequent encounter: Secondary | ICD-10-CM | POA: Diagnosis not present

## 2022-05-17 DIAGNOSIS — H0100A Unspecified blepharitis right eye, upper and lower eyelids: Secondary | ICD-10-CM | POA: Diagnosis not present

## 2022-05-17 DIAGNOSIS — H00011 Hordeolum externum right upper eyelid: Secondary | ICD-10-CM | POA: Diagnosis not present

## 2022-05-17 DIAGNOSIS — H0100B Unspecified blepharitis left eye, upper and lower eyelids: Secondary | ICD-10-CM | POA: Diagnosis not present

## 2022-05-17 DIAGNOSIS — S81812A Laceration without foreign body, left lower leg, initial encounter: Secondary | ICD-10-CM | POA: Diagnosis not present

## 2022-06-02 DIAGNOSIS — M6281 Muscle weakness (generalized): Secondary | ICD-10-CM | POA: Diagnosis not present

## 2022-06-07 DIAGNOSIS — M6281 Muscle weakness (generalized): Secondary | ICD-10-CM | POA: Diagnosis not present

## 2022-06-09 DIAGNOSIS — M6281 Muscle weakness (generalized): Secondary | ICD-10-CM | POA: Diagnosis not present

## 2022-06-12 DIAGNOSIS — S81812A Laceration without foreign body, left lower leg, initial encounter: Secondary | ICD-10-CM | POA: Diagnosis not present

## 2022-06-14 DIAGNOSIS — M6281 Muscle weakness (generalized): Secondary | ICD-10-CM | POA: Diagnosis not present

## 2022-06-16 DIAGNOSIS — M6281 Muscle weakness (generalized): Secondary | ICD-10-CM | POA: Diagnosis not present

## 2022-06-22 DIAGNOSIS — S81802D Unspecified open wound, left lower leg, subsequent encounter: Secondary | ICD-10-CM | POA: Diagnosis not present

## 2022-06-23 DIAGNOSIS — M6281 Muscle weakness (generalized): Secondary | ICD-10-CM | POA: Diagnosis not present

## 2022-06-25 DIAGNOSIS — U071 COVID-19: Secondary | ICD-10-CM | POA: Diagnosis not present

## 2022-07-05 ENCOUNTER — Other Ambulatory Visit: Payer: Self-pay | Admitting: Cardiology

## 2022-07-05 DIAGNOSIS — M6281 Muscle weakness (generalized): Secondary | ICD-10-CM | POA: Diagnosis not present

## 2022-07-07 DIAGNOSIS — M6281 Muscle weakness (generalized): Secondary | ICD-10-CM | POA: Diagnosis not present

## 2022-07-12 DIAGNOSIS — M6281 Muscle weakness (generalized): Secondary | ICD-10-CM | POA: Diagnosis not present

## 2022-07-14 DIAGNOSIS — M6281 Muscle weakness (generalized): Secondary | ICD-10-CM | POA: Diagnosis not present

## 2022-07-17 DIAGNOSIS — R1013 Epigastric pain: Secondary | ICD-10-CM | POA: Diagnosis not present

## 2022-07-17 DIAGNOSIS — R1032 Left lower quadrant pain: Secondary | ICD-10-CM | POA: Diagnosis not present

## 2022-07-20 DIAGNOSIS — M6281 Muscle weakness (generalized): Secondary | ICD-10-CM | POA: Diagnosis not present

## 2022-07-22 DIAGNOSIS — M6281 Muscle weakness (generalized): Secondary | ICD-10-CM | POA: Diagnosis not present

## 2022-07-27 DIAGNOSIS — M6281 Muscle weakness (generalized): Secondary | ICD-10-CM | POA: Diagnosis not present

## 2022-07-29 DIAGNOSIS — M6281 Muscle weakness (generalized): Secondary | ICD-10-CM | POA: Diagnosis not present

## 2022-08-02 ENCOUNTER — Other Ambulatory Visit: Payer: Self-pay | Admitting: Cardiology

## 2022-08-02 DIAGNOSIS — M6281 Muscle weakness (generalized): Secondary | ICD-10-CM | POA: Diagnosis not present

## 2022-08-04 DIAGNOSIS — M6281 Muscle weakness (generalized): Secondary | ICD-10-CM | POA: Diagnosis not present

## 2022-08-10 DIAGNOSIS — M6281 Muscle weakness (generalized): Secondary | ICD-10-CM | POA: Diagnosis not present

## 2022-08-17 DIAGNOSIS — M6281 Muscle weakness (generalized): Secondary | ICD-10-CM | POA: Diagnosis not present

## 2022-08-19 DIAGNOSIS — M6281 Muscle weakness (generalized): Secondary | ICD-10-CM | POA: Diagnosis not present

## 2022-08-24 DIAGNOSIS — M6281 Muscle weakness (generalized): Secondary | ICD-10-CM | POA: Diagnosis not present

## 2022-08-26 DIAGNOSIS — M6281 Muscle weakness (generalized): Secondary | ICD-10-CM | POA: Diagnosis not present

## 2022-08-30 DIAGNOSIS — M6281 Muscle weakness (generalized): Secondary | ICD-10-CM | POA: Diagnosis not present

## 2022-09-01 DIAGNOSIS — M6281 Muscle weakness (generalized): Secondary | ICD-10-CM | POA: Diagnosis not present

## 2022-09-03 DIAGNOSIS — L658 Other specified nonscarring hair loss: Secondary | ICD-10-CM | POA: Diagnosis not present

## 2022-09-03 DIAGNOSIS — L570 Actinic keratosis: Secondary | ICD-10-CM | POA: Diagnosis not present

## 2022-09-03 DIAGNOSIS — X32XXXD Exposure to sunlight, subsequent encounter: Secondary | ICD-10-CM | POA: Diagnosis not present

## 2022-09-09 DIAGNOSIS — M6281 Muscle weakness (generalized): Secondary | ICD-10-CM | POA: Diagnosis not present

## 2022-10-02 DIAGNOSIS — S81812A Laceration without foreign body, left lower leg, initial encounter: Secondary | ICD-10-CM | POA: Diagnosis not present

## 2022-10-21 DIAGNOSIS — S81812A Laceration without foreign body, left lower leg, initial encounter: Secondary | ICD-10-CM | POA: Diagnosis not present

## 2022-12-01 ENCOUNTER — Other Ambulatory Visit: Payer: Self-pay | Admitting: Cardiology

## 2023-01-04 ENCOUNTER — Other Ambulatory Visit: Payer: Self-pay

## 2023-01-04 ENCOUNTER — Emergency Department (HOSPITAL_COMMUNITY): Payer: Medicare Other

## 2023-01-04 ENCOUNTER — Observation Stay (HOSPITAL_COMMUNITY)
Admission: EM | Admit: 2023-01-04 | Discharge: 2023-01-06 | Disposition: A | Discharge status: Home Health | Payer: Medicare Other | Attending: Family Medicine | Admitting: Family Medicine

## 2023-01-04 DIAGNOSIS — I480 Paroxysmal atrial fibrillation: Secondary | ICD-10-CM | POA: Diagnosis not present

## 2023-01-04 DIAGNOSIS — R609 Edema, unspecified: Secondary | ICD-10-CM | POA: Diagnosis not present

## 2023-01-04 DIAGNOSIS — S99911A Unspecified injury of right ankle, initial encounter: Secondary | ICD-10-CM | POA: Diagnosis present

## 2023-01-04 DIAGNOSIS — S0990XA Unspecified injury of head, initial encounter: Secondary | ICD-10-CM | POA: Diagnosis not present

## 2023-01-04 DIAGNOSIS — Z7901 Long term (current) use of anticoagulants: Secondary | ICD-10-CM | POA: Diagnosis not present

## 2023-01-04 DIAGNOSIS — R519 Headache, unspecified: Secondary | ICD-10-CM | POA: Insufficient documentation

## 2023-01-04 DIAGNOSIS — S92101A Unspecified fracture of right talus, initial encounter for closed fracture: Secondary | ICD-10-CM | POA: Diagnosis not present

## 2023-01-04 DIAGNOSIS — R2681 Unsteadiness on feet: Secondary | ICD-10-CM | POA: Insufficient documentation

## 2023-01-04 DIAGNOSIS — S0101XA Laceration without foreign body of scalp, initial encounter: Secondary | ICD-10-CM | POA: Diagnosis not present

## 2023-01-04 DIAGNOSIS — I959 Hypotension, unspecified: Secondary | ICD-10-CM | POA: Diagnosis not present

## 2023-01-04 DIAGNOSIS — S82891A Other fracture of right lower leg, initial encounter for closed fracture: Principal | ICD-10-CM | POA: Insufficient documentation

## 2023-01-04 DIAGNOSIS — N1832 Chronic kidney disease, stage 3b: Secondary | ICD-10-CM | POA: Diagnosis not present

## 2023-01-04 DIAGNOSIS — W19XXXA Unspecified fall, initial encounter: Secondary | ICD-10-CM | POA: Diagnosis not present

## 2023-01-04 DIAGNOSIS — E785 Hyperlipidemia, unspecified: Secondary | ICD-10-CM | POA: Diagnosis not present

## 2023-01-04 DIAGNOSIS — M25562 Pain in left knee: Secondary | ICD-10-CM | POA: Diagnosis not present

## 2023-01-04 DIAGNOSIS — Z79899 Other long term (current) drug therapy: Secondary | ICD-10-CM | POA: Diagnosis not present

## 2023-01-04 DIAGNOSIS — M1712 Unilateral primary osteoarthritis, left knee: Secondary | ICD-10-CM | POA: Diagnosis not present

## 2023-01-04 LAB — CBC WITH DIFFERENTIAL/PLATELET
Abs Immature Granulocytes: 0.05 10*3/uL (ref 0.00–0.07)
Basophils Absolute: 0.1 10*3/uL (ref 0.0–0.1)
Basophils Relative: 0 %
Eosinophils Absolute: 0.1 10*3/uL (ref 0.0–0.5)
Eosinophils Relative: 0 %
HCT: 36.5 % (ref 36.0–46.0)
Hemoglobin: 12 g/dL (ref 12.0–15.0)
Immature Granulocytes: 0 %
Lymphocytes Relative: 8 %
Lymphs Abs: 0.9 10*3/uL (ref 0.7–4.0)
MCH: 33.1 pg (ref 26.0–34.0)
MCHC: 32.9 g/dL (ref 30.0–36.0)
MCV: 100.8 fL — ABNORMAL HIGH (ref 80.0–100.0)
Monocytes Absolute: 1.2 10*3/uL — ABNORMAL HIGH (ref 0.1–1.0)
Monocytes Relative: 10 %
Neutro Abs: 9.1 10*3/uL — ABNORMAL HIGH (ref 1.7–7.7)
Neutrophils Relative %: 82 %
Platelets: 169 10*3/uL (ref 150–400)
RBC: 3.62 MIL/uL — ABNORMAL LOW (ref 3.87–5.11)
RDW: 13.2 % (ref 11.5–15.5)
WBC: 11.4 10*3/uL — ABNORMAL HIGH (ref 4.0–10.5)
nRBC: 0 % (ref 0.0–0.2)

## 2023-01-04 LAB — BASIC METABOLIC PANEL
Anion gap: 6 (ref 5–15)
BUN: 27 mg/dL — ABNORMAL HIGH (ref 8–23)
CO2: 27 mmol/L (ref 22–32)
Calcium: 8.5 mg/dL — ABNORMAL LOW (ref 8.9–10.3)
Chloride: 103 mmol/L (ref 98–111)
Creatinine, Ser: 1.08 mg/dL — ABNORMAL HIGH (ref 0.44–1.00)
GFR, Estimated: 48 mL/min — ABNORMAL LOW (ref >60)
Glucose, Bld: 100 mg/dL — ABNORMAL HIGH (ref 70–99)
Potassium: 4.1 mmol/L (ref 3.5–5.1)
Sodium: 136 mmol/L (ref 135–145)

## 2023-01-04 MED ORDER — ACETAMINOPHEN 500 MG PO TABS
1000.0000 mg | ORAL_TABLET | Freq: Once | ORAL | Status: AC
  Administered 2023-01-04: 1000 mg via ORAL
  Filled 2023-01-04: qty 2

## 2023-01-04 NOTE — ED Provider Notes (Signed)
 [place] [facility] AT [facility] Provider Note   CSN: [id number] Arrival date & time: 01/04/23  1603     History  Chief Complaint  Patient presents with   Lisa Lang is a 89 y.o. female.  89 year old female with prior medical history as detailed below presents for evaluation.  Patient resting home with EMS transport.  She lives at home by herself.  She reports using a cane regularly for ambulation assistance.  She got up from her couch and lost her balance.  She went down hard and injured her right ankle.  She also complains of pain to the left knee.  She is not 100% sure that she did not strike her head.  She is on Eliquis .  Her last dose of Eliquis  was this morning.  She took Tylenol  prior to EMS arrival.  She reports that her pain is well-controlled at this time.  The history is provided by the patient and medical records.       Home Medications Prior to Admission medications   Medication Sig Start Date End Date Taking? Authorizing Provider  acetaminophen  (TYLENOL ) 325 MG tablet Take 2 tablets (650 mg total) by mouth every 4 (four) hours as needed. 11/25/21   [name], MD  amiodarone  (PACERONE ) 200 MG tablet TAKE 1 TABLET BY MOUTH TWICE DAILY OR AS DIRECTED 08/03/22   [name] ORN, MD  apixaban  (ELIQUIS ) 2.5 MG TABS tablet TAKE 1 TABLET(2.5 MG) BY MOUTH TWICE DAILY 03/11/22   [name] ORN, MD  atorvastatin  (LIPITOR) 40 MG tablet Take 40 mg by mouth at bedtime. 09/05/17   [provider]  calcium  carbonate (OS-CAL - DOSED IN MG OF ELEMENTAL CALCIUM ) 1250 MG tablet Take 1 tablet by mouth daily with breakfast.    [provider]  furosemide  (LASIX ) 20 MG tablet TAKE 1 TABLET BY MOUTH EVERY DAY AS NEEDED 07/05/22   [name] ORN, MD  metoprolol  succinate (TOPROL -XL) 25 MG 24 hr tablet TAKE 1/2 TABLET(12.5 MG) BY MOUTH AT BEDTIME 12/01/22   [name] ORN, MD  Multiple Vitamins-Minerals (MULTIVITAMIN PO) Take 1  tablet by mouth daily.    [provider]  oxyCODONE  (OXY IR/ROXICODONE ) 5 MG immediate release tablet Take 0.5 tablets (2.5 mg total) by mouth every 6 (six) hours as needed for severe pain. 11/25/21   [name], MD  pantoprazole (PROTONIX) 40 MG tablet Take 40 mg by mouth daily. 01/11/22   [provider]  Vitamin D , Ergocalciferol , 2000 units CAPS Take 2,000 Units by mouth daily.    [provider]      Allergies    Ciprofloxacin, Sulfa antibiotics, Amoxicillin, and Lidocaine     Review of Systems   Review of Systems  All other systems reviewed and are negative.   Physical Exam Updated Vital Signs There were no vitals taken for this visit. Physical Exam Vitals and nursing note reviewed.  Constitutional:      General: She is not in acute distress.    Appearance: Normal appearance. She is well-developed.  HENT:     Head: Normocephalic and atraumatic.  Eyes:     Conjunctiva/sclera: Conjunctivae normal.     Pupils: Pupils are equal, round, and reactive to light.  Cardiovascular:     Rate and Rhythm: Normal rate and regular rhythm.     Heart sounds: Normal heart sounds.  Pulmonary:     Effort: Pulmonary effort is normal. No respiratory distress.     Breath sounds:  Normal breath sounds.  Abdominal:     General: There is no distension.     Palpations: Abdomen is soft.     Tenderness: There is no abdominal tenderness.  Musculoskeletal:        General: Swelling and tenderness present. No deformity. Normal range of motion.     Cervical back: Normal range of motion and neck supple.     Comments: Tenderness and edema noted to the lateral aspect of the right ankle. Distal sensation intact. No break in skin.  Bruising and tenderness to anterior aspect of left knee.   Skin:    General: Skin is warm and dry.  Neurological:     General: No focal deficit present.     Mental Status: She is alert and oriented to person, place, and time.     ED Results /  Procedures / Treatments   Labs (all labs ordered are listed, but only abnormal results are displayed) Labs Reviewed - No data to display  EKG None  Radiology No results found.  Procedures Procedures    Medications Ordered in ED Medications - No data to display  ED Course/ Medical Decision Making/ A&P                                 Medical Decision Making Amount and/or Complexity of Data Reviewed Labs: ordered. Radiology: ordered.  Risk OTC drugs.    Medical Screen Complete  This patient presented to the ED with complaint of fall.  This complaint involves an extensive number of treatment options. The initial differential diagnosis includes, but is not limited to, trauma from fall  This presentation is: Acute, Self-Limited, Previously Undiagnosed, Uncertain Prognosis, Complicated, Systemic Symptoms, and Threat to Life/Bodily Function  Patient with fall from standing.   Uncertain Head injury per report. Patient on eliquis .  Right ankle pain and swelling post fall.  Ct head NAD.  Right ankle with acute talar process fracture. Case and Images discussed with Dr. Genelle. Boot and cane and weight bearing as tolerated is appropriate.   Patient appears quite unsteady with attempted standing/ambulation. She does not appear to be steady enough for DC home.   Hospitalist service made aware of case.   Additional history obtained: External records from outside sources obtained and reviewed including prior ED visits and prior Inpatient records.    Lab Tests:  I ordered and personally interpreted labs.  The pertinent results include:  cbc bmp   Imaging Studies ordered:  I ordered imaging studies including ct head, films of right ankle, r tibfib, left knee  I independently visualized and interpreted obtained imaging which showed right ankle talar process fx I agree with the radiologist interpretation.  Consultations Obtained:  I consulted Dr. Genelle,  and  discussed lab and imaging findings as well as pertinent plan of care.    Problem List / ED Course:  Fall, Right Ankle Fracture   Reevaluation:  After the interventions noted above, I reevaluated the patient and found that they have: improved    Disposition:  After consideration of the diagnostic results and the patients response to treatment, I feel that the patent would benefit from admission.          Final Clinical Impression(s) / ED Diagnoses Final diagnoses:  Fall, initial encounter  Closed fracture of right ankle, initial encounter    Rx / DC Orders ED Discharge Orders     None  Laurice Maude BROCKS, MD 01/04/23 2147

## 2023-01-04 NOTE — ED Triage Notes (Signed)
BIBA from home for a mechanical  fall, right ankle swelling, pain. 500 mg tylenol PTA   138/60 BP 64 HR 98% room air

## 2023-01-04 NOTE — Progress Notes (Signed)
 Orthopedic Tech Progress Note Patient Details:  Lisa Lang January 25, 1927 [id number]  Ortho Devices Type of Ortho Device: CAM walker Ortho Device/Splint Location: right cam boot applied Ortho Device/Splint Interventions: Ordered, Application, Adjustment   Post Interventions Patient Tolerated: Well Instructions Provided: Adjustment of device, Care of device  Waylan Thom Loving 01/04/2023, 7:36 PM

## 2023-01-05 DIAGNOSIS — S82891A Other fracture of right lower leg, initial encounter for closed fracture: Principal | ICD-10-CM

## 2023-01-05 DIAGNOSIS — I480 Paroxysmal atrial fibrillation: Secondary | ICD-10-CM | POA: Diagnosis not present

## 2023-01-05 DIAGNOSIS — R519 Headache, unspecified: Secondary | ICD-10-CM | POA: Diagnosis not present

## 2023-01-05 DIAGNOSIS — W19XXXA Unspecified fall, initial encounter: Secondary | ICD-10-CM

## 2023-01-05 DIAGNOSIS — N1832 Chronic kidney disease, stage 3b: Secondary | ICD-10-CM | POA: Diagnosis not present

## 2023-01-05 DIAGNOSIS — Z79899 Other long term (current) drug therapy: Secondary | ICD-10-CM | POA: Diagnosis not present

## 2023-01-05 DIAGNOSIS — E785 Hyperlipidemia, unspecified: Secondary | ICD-10-CM | POA: Diagnosis not present

## 2023-01-05 DIAGNOSIS — S99911A Unspecified injury of right ankle, initial encounter: Secondary | ICD-10-CM | POA: Diagnosis present

## 2023-01-05 DIAGNOSIS — Z7901 Long term (current) use of anticoagulants: Secondary | ICD-10-CM | POA: Diagnosis not present

## 2023-01-05 DIAGNOSIS — R2681 Unsteadiness on feet: Secondary | ICD-10-CM | POA: Diagnosis not present

## 2023-01-05 MED ORDER — POLYETHYLENE GLYCOL 3350 17 G PO PACK: 17.0000 g | PACK | Freq: Every day | ORAL | Status: DC | PRN

## 2023-01-05 MED ORDER — VITAMIN D (ERGOCALCIFEROL) 50 MCG (2000 UT) PO CAPS: 2000.0000 [IU] | ORAL_CAPSULE | Freq: Every day | ORAL | Status: DC

## 2023-01-05 MED ORDER — MELATONIN 5 MG PO TABS
5.0000 mg | ORAL_TABLET | Freq: Every evening | ORAL | Status: DC | PRN
  Administered 2023-01-05: 5 mg via ORAL
  Filled 2023-01-05: qty 1

## 2023-01-05 MED ORDER — VITAMIN D 25 MCG (1000 UNIT) PO TABS
2000.0000 [IU] | ORAL_TABLET | Freq: Every day | ORAL | Status: DC
  Administered 2023-01-05 – 2023-01-06 (×2): 2000 [IU] via ORAL
  Filled 2023-01-05 (×2): qty 2

## 2023-01-05 MED ORDER — APIXABAN 2.5 MG PO TABS
2.5000 mg | ORAL_TABLET | Freq: Two times a day (BID) | ORAL | Status: DC
  Administered 2023-01-05 – 2023-01-06 (×2): 2.5 mg via ORAL
  Filled 2023-01-05 (×2): qty 1

## 2023-01-05 MED ORDER — ACETAMINOPHEN 325 MG PO TABS
650.0000 mg | ORAL_TABLET | Freq: Once | ORAL | Status: AC
  Administered 2023-01-05: 650 mg via ORAL
  Filled 2023-01-05: qty 2

## 2023-01-05 MED ORDER — OXYCODONE HCL 5 MG PO TABS: 5.0000 mg | ORAL_TABLET | Freq: Four times a day (QID) | ORAL | Status: DC | PRN

## 2023-01-05 MED ORDER — PROCHLORPERAZINE EDISYLATE 10 MG/2ML IJ SOLN: 5.0000 mg | Freq: Four times a day (QID) | INTRAMUSCULAR | Status: DC | PRN

## 2023-01-05 MED ORDER — CALCIUM CARBONATE 1250 (500 CA) MG PO TABS
1.0000 | ORAL_TABLET | Freq: Every day | ORAL | Status: DC
  Administered 2023-01-05 – 2023-01-06 (×2): 1250 mg via ORAL
  Filled 2023-01-05 (×2): qty 1

## 2023-01-05 MED ORDER — ACETAMINOPHEN 325 MG PO TABS
650.0000 mg | ORAL_TABLET | Freq: Four times a day (QID) | ORAL | Status: DC | PRN
  Administered 2023-01-05: 650 mg via ORAL
  Filled 2023-01-05: qty 2

## 2023-01-05 MED ORDER — ATORVASTATIN CALCIUM 40 MG PO TABS
40.0000 mg | ORAL_TABLET | Freq: Every day | ORAL | Status: DC
  Administered 2023-01-05 – 2023-01-06 (×2): 40 mg via ORAL
  Filled 2023-01-05 (×2): qty 1

## 2023-01-05 MED ORDER — APIXABAN 2.5 MG PO TABS: 2.5000 mg | ORAL_TABLET | Freq: Two times a day (BID) | ORAL | Status: DC

## 2023-01-05 MED ORDER — ADULT MULTIVITAMIN W/MINERALS CH
1.0000 | ORAL_TABLET | Freq: Every day | ORAL | Status: DC
  Administered 2023-01-05 – 2023-01-06 (×2): 1 via ORAL
  Filled 2023-01-05 (×2): qty 1

## 2023-01-05 NOTE — Plan of Care (Signed)
 Patient with a right lateral talar avulsion consistent with an ankle sprain equivalent.  At this time she may be activity and weightbearing as tolerated with a cam boot for comfort to help with ambulation.  I will plan to see her in follow-up and advance her out of this.  No need for surgical intervention at this time

## 2023-01-05 NOTE — Evaluation (Signed)
 Physical Therapy Evaluation Patient Details Name: Lisa Lang MRN: [id number] DOB: [birth year] Today's Date: 01/05/2023  History of Present Illness  89 y.o. female who presented to ED after a mechanical fall at home.  noncontrast head CT is nonacute.  Right ankle showing moderate lateral malleolus soft tissue swelling, acute nondisplaced fracture of the lateral talar process just distal to the fibula,  right lateral talar avulsion consistent with an ankle sprain; ortho consulted.   PMH: paroxysmal A-fib on Eliquis , mitral valve prolapse, hyperlipidemia  Clinical Impression  Pt admitted with above diagnosis.  Pt reports amb with cane at baseline, very independent community ambulator;  doing very well at time of PT evaluation, amb ~ 41' with RW and CGA--reluctant to use RW initially however pt agreeable and acknowledges improved stability and pain control.  Pt reports she has a friend/caregiver that can provide additional assistance at home as needed, will stay with pt initially. Advised pt that she should use RW at home for now. Would benefit from HHPT at d/c  Pt currently with functional limitations due to the deficits listed below (see PT Problem List). Pt will benefit from acute skilled PT to increase their independence and safety with mobility to allow discharge.           If plan is discharge home, recommend the following: A little help with walking and/or transfers;A little help with bathing/dressing/bathroom;Assistance with cooking/housework;Assist for transportation;Help with stairs or ramp for entrance   Can travel by private vehicle        Equipment Recommendations None recommended by PT (has RW)  Recommendations for Other Services       Functional Status Assessment Patient has had a recent decline in their functional status and demonstrates the ability to make significant improvements in function in a reasonable and predictable amount of time.     Precautions / Restrictions  Precautions Precautions: Fall Required Braces or Orthoses: Other Brace Other Brace: camboot on R Restrictions Weight Bearing Restrictions Per Provider Order: No RLE Weight Bearing Per Provider Order: Weight bearing as tolerated      Mobility  Bed Mobility Overal bed mobility: Needs Assistance Bed Mobility: Supine to Sit, Sit to Supine     Supine to sit: Supervision Sit to supine: Supervision   General bed mobility comments: for safety; transitioned to sitting from stretcher and returned to hospital bed - supine    Transfers Overall transfer level: Needs assistance Equipment used: Rolling walker (2 wheels) Transfers: Sit to/from Stand Sit to Stand: Contact guard assist           General transfer comment: cues for hand placement    Ambulation/Gait Ambulation/Gait assistance: Contact guard assist Gait Distance (Feet): 80 Feet Assistive device: Rolling walker (2 wheels) Gait Pattern/deviations: Step-to pattern, Step-through pattern, Decreased step length - left       General Gait Details: cues for RW position and safety, began with step to pattern fo pain control, able to progress to step through with incr distance; pain fairly well controlled with improved wt shift to RLE  Stairs            Wheelchair Mobility     Tilt Bed    Modified Rankin (Stroke Patients Only)       Balance Overall balance assessment: Needs assistance Sitting-balance support: Feet unsupported, No upper extremity supported Sitting balance-Leahy Scale: Good     Standing balance support: No upper extremity supported, Reliant on assistive device for balance, During functional activity Standing balance-Leahy Scale: Fair  Pertinent Vitals/Pain Pain Assessment Pain Assessment: Faces Faces Pain Scale: Hurts a little bit Pain Location: right ankle Pain Descriptors / Indicators: Sore Pain Intervention(s): Limited activity within patient's  tolerance, Monitored during session, Repositioned    Home Living Family/patient expects to be discharged to:: Private residence Living Arrangements: Alone Available Help at Discharge: Friend(s) Type of Home: House Home Access: Stairs to enter   [employer] of Steps: 2   Home Layout: Two level;Able to live on main level with bedroom/bathroom        Prior Function                       Extremity/Trunk Assessment   Upper Extremity Assessment Upper Extremity Assessment: Overall WFL for tasks assessed    Lower Extremity Assessment Lower Extremity Assessment: RLE deficits/detail RLE Deficits / Details: knee and hip grossly WFL; cam boot R ankle RLE: Unable to fully assess due to immobilization       Communication   Communication Communication: No apparent difficulties  Cognition Arousal: Alert Behavior During Therapy: WFL for tasks assessed/performed Overall Cognitive Status: Within Functional Limits for tasks assessed                                          General Comments      Exercises     Assessment/Plan    PT Assessment Patient needs continued PT services  PT Problem List Decreased balance;Decreased mobility;Decreased knowledge of precautions;Decreased knowledge of use of DME;Pain       PT Treatment Interventions DME instruction;Gait training;Stair training;Functional mobility training;Therapeutic activities;Therapeutic exercise;Patient/family education    PT Goals (Current goals can be found in the Care Plan section)  Acute Rehab PT Goals Patient Stated Goal: home, back to beign IND PT Goal Formulation: With patient Time For Goal Achievement: 01/12/23 Potential to Achieve Goals: Good    Frequency Min 1X/week     Co-evaluation               AM-PAC PT 6 Clicks Mobility  Outcome Measure Help needed turning from your back to your side while in a flat bed without using bedrails?: A Little Help needed  moving from lying on your back to sitting on the side of a flat bed without using bedrails?: A Little Help needed moving to and from a bed to a chair (including a wheelchair)?: A Little Help needed standing up from a chair using your arms (e.g., wheelchair or bedside chair)?: A Little Help needed to walk in hospital room?: A Little Help needed climbing 3-5 steps with a railing? : A Little 6 Click Score: 18    End of Session Equipment Utilized During Treatment: Gait belt Activity Tolerance: Patient tolerated treatment well Patient left: in bed (ED hallway) Nurse Communication: Mobility status PT Visit Diagnosis: Other abnormalities of gait and mobility (R26.89)    Time: 8856-8842 PT Time Calculation (min) (ACUTE ONLY): 14 min   Charges:   PT Evaluation $PT Eval Low Complexity: 1 Low   PT General Charges $$ ACUTE PT VISIT: 1 Visit        Tawona Filsinger, PT  Acute Rehab Dept ([facility]) [phone]  01/05/2023   [facility] 01/05/2023, 1:09 PM

## 2023-01-05 NOTE — ED Notes (Signed)
 Pt states her pain is coming back and asked for tylenol. Provider contacted and approved tylenol.

## 2023-01-05 NOTE — Progress Notes (Signed)
 Subjective: Patient admitted this morning, see detailed H&P by Dr Shona  89 y.o. female with medical history significant for paroxysmal A-fib on Eliquis , mitral valve prolapse, hyperlipidemia, who presents after a mechanical fall at home.  The patient lives alone she uses a cane to ambulate at baseline.  She fell this morning.  Lost her balance.  No loss of consciousness.  She was in her usual state of health prior to this.  EMS was activated.  She was brought into the ED for further evaluation.  Vitals:   01/05/23 0254 01/05/23 0644  BP: 123/62 (!) 104/54  Pulse: (!) 54 (!) 57  Resp: 18 17  Temp: 98 F (36.7 C)   SpO2: 98% 97%      A/P  Right ankle fracture post mechanical fall, POA Right cam boot in place As needed analgesics Weightbearing as tolerated per orthopedic surgery PT OT assessment Fall precautions TOC consulted to assist with DC planning   Hyperlipidemia Resume home Lipitor   Mild leukocytosis Afebrile and nonseptic appearing Suspect reactive however will rule out infection WBC 11.4, follow UA   CKD 3B Appears to be at her baseline creatinine 1.0 with GFR 41 Avoid nephrotoxic agent, dehydration, and hypotension Monitor urine output Repeat BMP in the morning  History of atrial fibrillation -Patient takes Eliquis  at home Discussed with orthopedics, will restart Eliquis .  Sabas GORMAN Brod [facility]

## 2023-01-05 NOTE — H&P (Signed)
 History and Physical  Lisa Lang FMW:[id number] DOB: [birth year] DOA: 01/04/2023  Referring physician: Dr. Laurice, EDP  PCP: Signa Rush, MD (Inactive)  Outpatient Specialists: Cardiology Patient coming from: Home, lives alone.  Chief Complaint: Fall   HPI: Lisa Lang is a 89 y.o. female with medical history significant for paroxysmal A-fib on Eliquis , mitral valve prolapse, hyperlipidemia, who presents after a mechanical fall at home.  The patient lives alone she uses a cane to ambulate at baseline.  She fell this morning.  Lost her balance.  No loss of consciousness.  She was in her usual state of health prior to this.  EMS was activated.  She was brought into the ED for further evaluation.  In the ED, noncontrast head CT is nonacute.  Right ankle showing moderate lateral malleolus soft tissue swelling, acute nondisplaced fracture of the lateral talar process just distal to the fibula.  EDP discussed the case with orthopedic surgery on-call Dr. Lysbeth who recommended nonoperative management.  A cam walker/right cam boot was applied.  The patient was having difficulty ambulating with this in the ER.  Due to concern for unsafe discharge, EDP requested admission for further management.  Admitted by [facility], hospitalist service.  ED Course: Temperature 98.7.  BP 148/63, pulse 89, respiration rate 18, O2 saturation 97% on room air.  Lab studies notable for creatinine 1.08 with GFR 48.  WBC 11.4, MCV 100.8.  Review of Systems: Review of systems as noted in the HPI. All other systems reviewed and are negative.   Past Medical History:  Diagnosis Date   Arthritis    Diverticulitis 1995   And 2007   Dysrhythmia    Hyperlipidemia LDL goal <100    Mitral valve prolapse    Bileaflet MVP with moderate MR.   PAF (paroxysmal atrial fibrillation) (HCC) 11/2017   CHA2DS2 VASc score > 3 (Age >49 and female sex), --> Eliquis  & Metoprolol     Past Surgical History:  Procedure Laterality Date    CARDIOVERSION N/A 10/28/2021   Procedure: CARDIOVERSION;  Surgeon: Hobart Powell BRAVO, MD;  Location: [facility] ENDOSCOPY;  Service: Cardiovascular;  Laterality: N/A;   CARDIOVERSION N/A 11/06/2021   Procedure: CARDIOVERSION;  Surgeon: Santo Stanly LABOR, MD;  Location: [facility] ENDOSCOPY;  Service: Cardiovascular;  Laterality: N/A;   CATARACT EXTRACTION, BILATERAL     CHOLECYSTECTOMY N/A 11/24/2021   Procedure: LAPAROSCOPIC CHOLECYSTECTOMY;  Surgeon: Eletha Boas, MD;  Location: [facility] ORS;  Service: General;  Laterality: N/A;   ERCP N/A 11/23/2021   Procedure: ENDOSCOPIC RETROGRADE CHOLANGIOPANCREATOGRAPHY (ERCP);  Surgeon: Saintclair Jasper, MD;  Location: [facility] ENDOSCOPY;  Service: Gastroenterology;  Laterality: N/A;   REMOVAL OF STONES  11/23/2021   Procedure: REMOVAL OF STONES;  Surgeon: Saintclair Jasper, MD;  Location: [facility] ENDOSCOPY;  Service: Gastroenterology;;   [name]  11/23/2021   Procedure: [name];  Surgeon: Saintclair Jasper, MD;  Location: [facility] ENDOSCOPY;  Service: Gastroenterology;;   TEE WITHOUT CARDIOVERSION N/A 10/28/2021   Procedure: TRANSESOPHAGEAL ECHOCARDIOGRAM (TEE) - SYNCHRONIZED DIRECT CURRENT CARDIOVERSION (DCCV);  Surgeon: Hobart Powell BRAVO, MD;  Location: [facility] ENDOSCOPY;  Service: CV;; LVEF 65-70%. No RWMA . Mod LA dilation w/o thrombus. Normal RV, Mod RA dilation. 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 Bileaflet Prolapse w/ Large Post & small Ant Jet.= 3+/Mod MR - no PVV reversal. = DCCV   TRANSTHORACIC ECHOCARDIOGRAM  10/25/2021   Notably he was very elderly woman.  No acute distress.  Just seems worn out.   TRANSTHORACIC ECHOCARDIOGRAM  12/12/2019  EF 60 to 65%.  No R WMA.  Unable to assess diastolic pressures.  Normal RV pressures.  Mild biatrial enlargement.  Moderate aortic calcification - no AS.  Severe BI-Leaflet MVP with Mod-Severe MR    Social History:  reports that she has never smoked. She has never used smokeless tobacco. She reports that she does not  drink alcohol and does not use drugs.   Allergies  Allergen Reactions   Ciprofloxacin Diarrhea   Sulfa Antibiotics     unknown   Amoxicillin Rash   Lidocaine  Palpitations    unknown    Family History  Problem Relation Age of Onset   Cervical cancer Mother    CAD Father    Lung cancer Sister    Stroke Neg Hx    Diabetes Neg Hx       Prior to Admission medications   Medication Sig Start Date End Date Taking? Authorizing Provider  fluorouracil (EFUDEX) 5 % cream Apply topically 2 (two) times daily. 09/03/22  Yes [provider]  acetaminophen  (TYLENOL ) 325 MG tablet Take 2 tablets (650 mg total) by mouth every 4 (four) hours as needed. 11/25/21  Yes [name], MD  amiodarone  (PACERONE ) 200 MG tablet TAKE 1 TABLET BY MOUTH TWICE DAILY OR AS DIRECTED 08/03/22  Yes [name] ORN, MD  apixaban  (ELIQUIS ) 2.5 MG TABS tablet TAKE 1 TABLET(2.5 MG) BY MOUTH TWICE DAILY 03/11/22  Yes [name] ORN, MD  atorvastatin  (LIPITOR) 40 MG tablet Take 40 mg by mouth at bedtime. 09/05/17  Yes [provider]  calcium  carbonate (OS-CAL - DOSED IN MG OF ELEMENTAL CALCIUM ) 1250 MG tablet Take 1 tablet by mouth daily with breakfast.   Yes [provider]  furosemide  (LASIX ) 20 MG tablet TAKE 1 TABLET BY MOUTH EVERY DAY AS NEEDED Patient not taking: Reported on 01/04/2023 07/05/22   [name] ORN, MD  metoprolol  succinate (TOPROL -XL) 25 MG 24 hr tablet TAKE 1/2 TABLET(12.5 MG) BY MOUTH AT BEDTIME 12/01/22  Yes [name] ORN, MD  Multiple Vitamins-Minerals (MULTIVITAMIN PO) Take 1 tablet by mouth daily.   Yes [provider]  oxyCODONE  (OXY IR/ROXICODONE ) 5 MG immediate release tablet Take 0.5 tablets (2.5 mg total) by mouth every 6 (six) hours as needed for severe pain. Patient not taking: Reported on 01/04/2023 11/25/21   [name], MD  pantoprazole (PROTONIX) 40 MG tablet Take 40 mg by mouth daily. Patient not taking: Reported on 01/04/2023 01/11/22    [provider]  Vitamin D , Ergocalciferol , 2000 units CAPS Take 2,000 Units by mouth daily.   Yes [provider]    Physical Exam: BP (!) 148/63 (BP Location: Left Arm)   Pulse (!) 59   Temp 98.7 F (37.1 C) (Oral)   Resp 18   SpO2 97%   General: 89 y.o. year-old female well developed well nourished in no acute distress.  Alert and oriented x3. Cardiovascular: Regular rate and rhythm with no rubs or gallops.  No thyromegaly or JVD noted.  Trace lower extremity edema bilaterally. Respiratory: Clear to auscultation with no wheezes or rales. Good inspiratory effort. Abdomen: Soft nontender nondistended with normal bowel sounds x4 quadrants. Muskuloskeletal: No cyanosis, clubbing or edema noted bilaterally Neuro: CN II-XII intact, strength, sensation, reflexes Skin: No ulcerative lesions noted or rashes Psychiatry: Judgement and insight appear normal. Mood is appropriate for condition and setting          Labs on Admission:  Basic Metabolic Panel: Recent Labs  Lab 01/04/23 2116  NA 136  K 4.1  CL 103  CO2 27  GLUCOSE 100*  BUN 27*  CREATININE 1.08*  CALCIUM  8.5*   Liver Function Tests: No results for input(s): AST, ALT, ALKPHOS, BILITOT, PROT, ALBUMIN in the last 168 hours. No results for input(s): LIPASE, AMYLASE in the last 168 hours. No results for input(s): AMMONIA in the last 168 hours. CBC: Recent Labs  Lab 01/04/23 2116  WBC 11.4*  NEUTROABS 9.1*  HGB 12.0  HCT 36.5  MCV 100.8*  PLT 169   Cardiac Enzymes: No results for input(s): CKTOTAL, CKMB, CKMBINDEX, TROPONINI in the last 168 hours.  BNP (last 3 results) Recent Labs    01/06/22 1312  BNP 329.4*    ProBNP (last 3 results) No results for input(s): PROBNP in the last 8760 hours.  CBG: No results for input(s): GLUCAP in the last 168 hours.  Radiological Exams on Admission: CT Head Wo Contrast Result Date: 01/04/2023 CLINICAL DATA:  Head  trauma, minor (Age >= 65y) EXAM: CT HEAD WITHOUT CONTRAST TECHNIQUE: Contiguous axial images were obtained from the base of the skull through the vertex without intravenous contrast. RADIATION DOSE REDUCTION: This exam was performed according to the departmental dose-optimization program which includes automated exposure control, adjustment of the mA and/or kV according to patient size and/or use of iterative reconstruction technique. COMPARISON:  CT head March 23, 2022. FINDINGS: Brain: No evidence of acute infarction, hemorrhage, hydrocephalus, extra-axial collection or mass lesion/mass effect. Cerebral atrophy. Vascular: Calcific atherosclerosis.  No hyperdense vessel. Skull: High frontal scalp contusion/laceration without fracture. Sinuses/Orbits: Clear sinuses.  No acute orbital findings. Other: No mastoid effusions. IMPRESSION: 1. No evidence of acute intracranial abnormality. 2. High frontal scalp contusion/laceration without fracture. Electronically Signed   By: Gilmore GORMAN Molt M.D.   On: 01/04/2023 18:51   DG Knee Complete 4 Views Left Result Date: 01/04/2023 CLINICAL DATA:  Fall.  Pain. EXAM: LEFT KNEE - COMPLETE 4+ VIEW COMPARISON:  None Available. FINDINGS: Mildly decreased bone mineralization. No joint effusion. Mild medial compartment joint space narrowing. No acute fracture or dislocation. IMPRESSION: Mild medial compartment osteoarthritis. Electronically Signed   By: Tanda Lyons M.D.   On: 01/04/2023 18:40   DG Tibia/Fibula Right Result Date: 01/04/2023 CLINICAL DATA:  Fall.  Ankle pain. EXAM: RIGHT TIBIA AND FIBULA - 2 VIEW COMPARISON:  None Available. FINDINGS: Redemonstration of acute fracture of the lateral process of the talus as described on contemporaneous right ankle radiographs with less than 1 mm diastasis. Moderate lateral ankle soft tissue swelling. No acute fracture is seen within the tibia or fibula. IMPRESSION: Redemonstration of acute fracture of the lateral process of the  talus as described on contemporaneous right ankle radiographs with less than 1 mm diastasis. No tibia or fibular fracture. Electronically Signed   By: Tanda Lyons M.D.   On: 01/04/2023 18:33   DG Ankle Complete Right Result Date: 01/04/2023 CLINICAL DATA:  Fall.  Right ankle pain and swelling. EXAM: RIGHT ANKLE - COMPLETE 3+ VIEW COMPARISON:  None Available. FINDINGS: There is diffuse decreased bone mineralization. Moderate lateral malleolar soft tissue swelling. On frontal view there appears to be mild lucency on 1 mm cortical step-off at the lateral talar process this distal to the fibula. No dislocation. Joint spaces are preserved. Minimal chronic enthesopathic change at the Achilles insertion on the calcaneus. IMPRESSION: 1. Moderate lateral malleolar soft tissue swelling. 2. Acute nondisplaced fracture of the lateral talar process, just distal to the fibula. Electronically Signed   By: Tanda  Viola M.D.   On: 01/04/2023 18:30    EKG: I independently viewed the EKG done and my findings are as followed: None available at time of visit.  Assessment/Plan Present on Admission:  Fall, initial encounter  Principal Problem:   Fall, initial encounter  Right ankle fracture post mechanical fall, POA Right cam boot in place As needed analgesics Weightbearing as tolerated per orthopedic surgery PT OT assessment Fall precautions TOC consulted to assist with DC planning  Hyperlipidemia Resume home Lipitor  Mild leukocytosis Afebrile and nonseptic appearing Suspect reactive however will rule out infection WBC 11.4, follow UA  CKD 3B Appears to be at her baseline creatinine 1.0 with GFR 41 Avoid nephrotoxic agent, dehydration, and hypotension Monitor urine output Repeat BMP in the morning   Time: 75 minutes.   DVT prophylaxis: Home Eliquis  on hold tonight  Code Status: Full code  Family Communication: None at bedside  Disposition Plan: Admitted to telemetry unit  Consults  called: EDP consulted orthopedic surgery.  Admission status: Observation status.   Status is: Observation    Terry LOISE Hurst MD [facility] Pager [phone]  If 7PM-7AM, please contact night-coverage [email or web address] Password [email or web address]  01/05/2023, 12:21 AM

## 2023-01-06 DIAGNOSIS — W19XXXA Unspecified fall, initial encounter: Secondary | ICD-10-CM | POA: Diagnosis not present

## 2023-01-06 DIAGNOSIS — I480 Paroxysmal atrial fibrillation: Secondary | ICD-10-CM | POA: Diagnosis not present

## 2023-01-06 DIAGNOSIS — E785 Hyperlipidemia, unspecified: Secondary | ICD-10-CM | POA: Diagnosis not present

## 2023-01-06 DIAGNOSIS — R2681 Unsteadiness on feet: Secondary | ICD-10-CM | POA: Diagnosis not present

## 2023-01-06 DIAGNOSIS — S82891A Other fracture of right lower leg, initial encounter for closed fracture: Secondary | ICD-10-CM | POA: Diagnosis not present

## 2023-01-06 DIAGNOSIS — Z79899 Other long term (current) drug therapy: Secondary | ICD-10-CM | POA: Diagnosis not present

## 2023-01-06 DIAGNOSIS — Z7901 Long term (current) use of anticoagulants: Secondary | ICD-10-CM | POA: Diagnosis not present

## 2023-01-06 LAB — BASIC METABOLIC PANEL
Anion gap: 5 (ref 5–15)
BUN: 28 mg/dL — ABNORMAL HIGH (ref 8–23)
CO2: 26 mmol/L (ref 22–32)
Calcium: 8.2 mg/dL — ABNORMAL LOW (ref 8.9–10.3)
Chloride: 102 mmol/L (ref 98–111)
Creatinine, Ser: 1.16 mg/dL — ABNORMAL HIGH (ref 0.44–1.00)
GFR, Estimated: 44 mL/min — ABNORMAL LOW (ref >60)
Glucose, Bld: 92 mg/dL (ref 70–99)
Potassium: 3.9 mmol/L (ref 3.5–5.1)
Sodium: 133 mmol/L — ABNORMAL LOW (ref 135–145)

## 2023-01-06 LAB — CBC
HCT: 34.1 % — ABNORMAL LOW (ref 36.0–46.0)
Hemoglobin: 11.3 g/dL — ABNORMAL LOW (ref 12.0–15.0)
MCH: 33.2 pg (ref 26.0–34.0)
MCHC: 33.1 g/dL (ref 30.0–36.0)
MCV: 100.3 fL — ABNORMAL HIGH (ref 80.0–100.0)
Platelets: 143 10*3/uL — ABNORMAL LOW (ref 150–400)
RBC: 3.4 MIL/uL — ABNORMAL LOW (ref 3.87–5.11)
RDW: 13.6 % (ref 11.5–15.5)
WBC: 9.5 10*3/uL (ref 4.0–10.5)
nRBC: 0 % (ref 0.0–0.2)

## 2023-01-06 LAB — MAGNESIUM: Magnesium: 2.2 mg/dL (ref 1.7–2.4)

## 2023-01-06 LAB — PHOSPHORUS: Phosphorus: 3.3 mg/dL (ref 2.5–4.6)

## 2023-01-06 NOTE — Evaluation (Signed)
 Occupational Therapy Evaluation Patient Details Name: Lisa Lang MRN: [id number] DOB: September 17, 1927 Today's Date: 01/06/2023   History of Present Illness Lisa Lang is a 89 yr old female who is s/p a fall at home. She was found to have a R lateral talar avulsion consistent with an ankle sprain. PMH: a fib, mitral valve prolapse, HLD, arthritis   Clinical Impression   The pt is currently presenting below her baseline level of functioning for self-care management, as she is limited by the below listed deficits (see OT problem list). During the session today, she required min assist for lower body dressing, supervision to stand using a RW, and CGA for toileting at bathroom level. OT educated her on compensatory strategies for self-care management, general safety during ADLs, equipment recommendations for home and how to correctly don and doff R CAM boot. She presented with good understanding and teach back abilities. Continue OT plan of care. Home health OT recommended at discharge.        If plan is discharge home, recommend the following: Assist for transportation;Help with stairs or ramp for entrance;Assistance with cooking/housework    Functional Status Assessment  Patient has had a recent decline in their functional status and demonstrates the ability to make significant improvements in function in a reasonable and predictable amount of time.  Equipment Recommendations  None recommended by OT    Recommendations for Other Services       Precautions / Restrictions Precautions Precautions: Fall Required Braces or Orthoses: Other Brace Other Brace: camboot on R Restrictions Weight Bearing Restrictions Per Provider Order: Yes RLE Weight Bearing Per Provider Order: Weight bearing as tolerated      Mobility Bed Mobility Overal bed mobility: Needs Assistance Bed Mobility: Supine to Sit, Sit to Supine     Supine to sit: Supervision Sit to supine: Supervision         Transfers Overall transfer level: Needs assistance Equipment used: Rolling walker (2 wheels) Transfers: Sit to/from Stand Sit to Stand: Supervision                  Balance     Sitting balance-Leahy Scale: Good       Standing balance-Leahy Scale: Fair         ADL either performed or assessed with clinical judgement   ADL Overall ADL's : Independent Eating/Feeding: Independent;Sitting   Grooming: Contact guard assist;Standing Grooming Details (indicate cue type and reason): She performed hand washing in standing at sink level.         Upper Body Dressing : Set up;Sitting Upper Body Dressing Details (indicate cue type and reason): She donned a bra and button up shirt seated EOB. Lower Body Dressing: Minimal assistance;Cueing for compensatory techniques;Sitting/lateral leans;Sit to/from stand;Cueing for sequencing Lower Body Dressing Details (indicate cue type and reason): OT educated the pt on compensatory strategies for lower body dressing, specifically implementing the figure 4 technique when performing, performing majority of tasks in sitting, and lateral sitting to don clothing articles over hips in sitting. OT also instructed her on proper technique for doffing and donning her R LE CAM boot. Toilet Transfer: Contact guard assist;Rolling walker (2 wheels);Grab bars;Cueing for sequencing Toilet Transfer Details (indicate cue type and reason): The pt ambulated to and from the bathroom in her room using a RW. She required min assist for walker and R LE positioning. Toileting- Clothing Manipulation and Hygiene: Contact guard assist;Sit to/from stand Toileting - Clothing Manipulation Details (indicate cue type and reason): Pt performed toileting at  bathroom level.             Vision   Additional Comments: She correctly read the time depicted on the wall clock.            Pertinent Vitals/Pain Pain Assessment Pain Assessment: No/denies pain      Extremity/Trunk Assessment Upper Extremity Assessment Upper Extremity Assessment: Overall WFL for tasks assessed;Right hand dominant   Lower Extremity Assessment RLE Deficits / Details: knee and hip grossly WFL; cam boot R ankle       Communication Communication Communication: No apparent difficulties   Cognition Arousal: Alert Behavior During Therapy: WFL for tasks assessed/performed Overall Cognitive Status: Within Functional Limits for tasks assessed                       Home Living Family/patient expects to be discharged to:: Private residence Living Arrangements: Alone   Type of Home: House   Entrance Stairs-Number of Steps: 1   Home Layout: One level         Bathroom Toilet: Handicapped height     Home Equipment: Wheelchair - manual;Tub bench;Grab bars - tub/shower;Rolling Environmental Consultant (2 wheels);Cane - single point          Prior Functioning/Environment Prior Level of Function : Independent/Modified Independent;Driving             Mobility Comments: She did not use an AD inside the home, however used a cane outside the home. ADLs Comments: She was independent with ADLs, cooking, cleaning, and driving.        OT Problem List: Decreased strength;Impaired balance (sitting and/or standing);Decreased knowledge of use of DME or AE;Decreased knowledge of precautions      OT Treatment/Interventions: Self-care/ADL training;Therapeutic exercise;DME and/or AE instruction;Balance training;Therapeutic activities;Patient/family education    OT Goals(Current goals can be found in the care plan section) Acute Rehab OT Goals OT Goal Formulation: With patient Time For Goal Achievement: 01/20/23 Potential to Achieve Goals: Good ADL Goals Pt Will Perform Lower Body Dressing: with modified independence;sitting/lateral leans;sit to/from stand Pt Will Transfer to Toilet: with modified independence;ambulating Pt Will Perform Toileting - Clothing Manipulation  and hygiene: with modified independence;sit to/from stand  OT Frequency: Min 1X/week       AM-PAC OT 6 Clicks Daily Activity     Outcome Measure Help from another person eating meals?: None Help from another person taking care of personal grooming?: A Little Help from another person toileting, which includes using toliet, bedpan, or urinal?: A Little Help from another person bathing (including washing, rinsing, drying)?: A Little Help from another person to put on and taking off regular upper body clothing?: A Little Help from another person to put on and taking off regular lower body clothing?: A Little 6 Click Score: 19   End of Session Equipment Utilized During Treatment: Rolling walker (2 wheels) Nurse Communication: Mobility status  Activity Tolerance: Patient tolerated treatment well Patient left: in bed;with call bell/phone within reach;with bed alarm set  OT Visit Diagnosis: Unsteadiness on feet (R26.81)                Time: 8957-8876 OT Time Calculation (min): 41 min Charges:  OT General Charges $OT Visit: 1 Visit OT Evaluation $OT Eval Moderate Complexity: 1 Mod OT Treatments $Self Care/Home Management : 8-22 mins    Delanna LITTIE Molt, OTR/L 01/06/2023, 2:58 PM

## 2023-01-06 NOTE — Plan of Care (Signed)
No acute events overnight. Problem: Education: Goal: Knowledge of General Education information will improve Description: Including pain rating scale, medication(s)/side effects and non-pharmacologic comfort measures Outcome: Progressing   Problem: Health Behavior/Discharge Planning: Goal: Ability to manage health-related needs will improve Outcome: Progressing   Problem: Clinical Measurements: Goal: Ability to maintain clinical measurements within normal limits will improve Outcome: Progressing Goal: Will remain free from infection Outcome: Progressing Goal: Diagnostic test results will improve Outcome: Progressing Goal: Respiratory complications will improve Outcome: Progressing Goal: Cardiovascular complication will be avoided Outcome: Progressing   Problem: Activity: Goal: Risk for activity intolerance will decrease Outcome: Progressing   Problem: Nutrition: Goal: Adequate nutrition will be maintained Outcome: Progressing   Problem: Coping: Goal: Level of anxiety will decrease Outcome: Progressing   Problem: Elimination: Goal: Will not experience complications related to bowel motility Outcome: Progressing Goal: Will not experience complications related to urinary retention Outcome: Progressing   Problem: Pain Management: Goal: General experience of comfort will improve Outcome: Progressing   Problem: Safety: Goal: Ability to remain free from injury will improve Outcome: Progressing   Problem: Skin Integrity: Goal: Risk for impaired skin integrity will decrease Outcome: Progressing

## 2023-01-06 NOTE — TOC Initial Note (Signed)
 Transition of Care [facility]) - Initial/Assessment Note    Patient Details  Name: Lisa Lang MRN: [id number] Date of Birth: 08-Aug-1927  Transition of Care [facility]) CM/SW Contact:    Lisa Manuella Quill, RN Phone Number: 01/06/2023, 9:34 AM  Clinical Narrative:                 TOC for d/c planning; PT recc HHPT; spoke w/ pt in room; pt says she lives at home and plans to return at d/c; she verified she has PCP/insurance; pt identified POC son Lisa Lang [phone]); she denies SDOH risk; pt says she has transportation; she has cane, walker, and wheelchair; pt says she has grab bars in shower; she does not have HH services or home oxygen; pt agreed to receive HHPT; she does not have an agency preference; spoke w/ Lisa Lang at [facility]; she says agency can provide service; pt notified; agency contact info placed in follow up provider section of d/c instructions; also this RN, CM explained MOON to pt; she verbalized understanding, and signed MOON; copy of document given to pt; no TOC needs.  Expected Discharge Plan: Home w Home Health Services Barriers to Discharge: No Barriers Identified   Patient Goals and CMS Choice Patient states their goals for this hospitalization and ongoing recovery are:: home CMS Medicare.gov Compare Post Acute Care list provided to:: Patient Choice offered to / list presented to : Patient [place] ownership interest in [facility].provided to:: Patient    Expected Discharge Plan and Services   Discharge Planning Services: CM Consult Post Acute Care Choice: Home Health Living arrangements for the past 2 months: Single Family Home                 DME Arranged: N/A DME Agency: NA       HH Arranged: PT HH Agency: [facility] Date HH Agency Contacted: 01/06/23 Time HH Agency Contacted: [phone] Representative spoke with at [facility] Agency: Lisa Lang  Prior Living Arrangements/Services Living arrangements for the past 2 months: Single Family  Home Lives with:: Self Patient language and need for interpreter reviewed:: Yes Do you feel safe going back to the place where you live?: Yes      Need for Family Participation in Patient Care: Yes (Comment) Care giver support system in place?: Yes (comment) Current home services: DME (cane, walker, wheelchair) Criminal Activity/Legal Involvement Pertinent to Current Situation/Hospitalization: No - Comment as needed  Activities of Daily Living   ADL Screening (condition at time of admission) Independently performs ADLs?: Yes (appropriate for developmental age) Is the patient deaf or have difficulty hearing?: No Does the patient have difficulty seeing, even when wearing glasses/contacts?: No Does the patient have difficulty concentrating, remembering, or making decisions?: No  Permission Sought/Granted Permission sought to share information with : Case Manager Permission granted to share information with : Yes, Verbal Permission Granted  Share Information with NAME: Case Manager     Permission granted to share info w Relationship: Lisa Lang (son) [phone]     Emotional Assessment Appearance:: Appears stated age Attitude/Demeanor/Rapport: Gracious Affect (typically observed): Accepting Orientation: : Oriented to Self, Oriented to Place, Oriented to  Time, Oriented to Situation Alcohol / Substance Use: Not Applicable Psych Involvement: No (comment)  Admission diagnosis:  Closed fracture of right ankle, initial encounter [S82.891A] Fall, initial encounter [W19.XXXA] Patient Active Problem List   Diagnosis Date Noted   Fall, initial encounter 01/04/2023   On amiodarone  therapy 01/08/2022   Acute cholecystitis 11/21/2021  Choledocholithiasis 11/21/2021   Sepsis (HCC) 11/21/2021   Dehydration 11/21/2021   Acute cystitis 11/21/2021   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) 11/02/2021   Atrial fibrillation with rapid ventricular response (HCC) 10/24/2021    Hypercoagulable state due to paroxysmal atrial fibrillation (HCC): CHA2DS2-VASc score 6 10/24/2021   Moderate to severe mitral regurgitation 01/14/2020   Fatigue due to treatment 11/14/2017   HLD (hyperlipidemia) 11/09/2017   Arrhythmogenic bileaflet mitral valve prolapse syndrome 11/09/2017   Paroxysmal atrial fibrillation (HCC) 09/27/2017   Ovarian cyst 09/26/2017   PCP:  Signa Rush, MD (Inactive) Pharmacy:   [facility] [address] [place] - [address] AT [facility] [address] [place] [address] Phone: [phone] Fax: [phone]  [facility] - [place], [place] - [address] [place] [address] Phone: [phone] Fax: [phone]  [facility] [address] [place] [address] Phone: [phone] Fax: [phone]     Social Drivers of Health (SDOH) Social History: SDOH Screenings   Food Insecurity: No Food Insecurity (01/06/2023)  Housing: Low Risk  (01/06/2023)  Transportation Needs: No Transportation Needs (01/06/2023)  Utilities: Not At Risk (01/06/2023)  Social Connections: Moderately Integrated (01/05/2023)  Tobacco Use: Low Risk  (04/21/2022)   SDOH Interventions: Food Insecurity Interventions: Intervention Not Indicated, Inpatient TOC Housing Interventions: Intervention Not Indicated, Inpatient TOC Transportation Interventions: Intervention Not Indicated, Inpatient TOC Utilities Interventions: Intervention Not Indicated, Inpatient TOC   Readmission Risk Interventions     No data to display

## 2023-01-06 NOTE — Progress Notes (Signed)
 Physical Therapy Treatment Patient Details Name: Lisa Lang MRN: [id number] DOB: [birth year] Today's Date: 01/06/2023   History of Present Illness 89 y.o. female who presented to ED after a mechanical fall at home.  noncontrast head CT is nonacute.  Right ankle showing moderate lateral malleolus soft tissue swelling, acute nondisplaced fracture of the lateral talar process just distal to the fibula,  right lateral talar avulsion consistent with an ankle sprain; ortho consulted.   PMH: paroxysmal A-fib on Eliquis , mitral valve prolapse, hyperlipidemia    PT Comments  General Comments: AxO x 3 pleasant and bright Lady for her age.  Pt stated, she got herself dressed. Assisted with amb in hallway as well as practiced stairs. Pt self able to transfer in/OOB.  General transfer comment: increased effort but self able and present with good safety cognition.  General Gait Details: <25% VC's on safety with turns and proper walker to self distance.  Feels like I want to fall backward sometimes, stated pt.  Instructed to shorten R step length.  Prior pt was amb with a cane but Educated need for walker for now as Camboot can throw off your balance and leg height.  Pt agreed. General stair comments: Instructed/Educated on proper sequencing up with the good and down with the bad stair training 2 steps using B rails. Also Educated on proper donning/doffing of CAM boot with proper placement of grey inner pads and strap fit.  Also Educated on importance of R LE elevation to minimize swelling as her activity level increases.   Pt plans to D/C back to her home with intermittent family support.    If plan is discharge home, recommend the following: A little help with walking and/or transfers;A little help with bathing/dressing/bathroom;Assistance with cooking/housework;Assist for transportation;Help with stairs or ramp for entrance   Can travel by private vehicle        Equipment Recommendations  None  recommended by PT    Recommendations for Other Services       Precautions / Restrictions Precautions Precautions: Fall Required Braces or Orthoses: Other Brace Other Brace: camboot on R Restrictions Weight Bearing Restrictions Per Provider Order: No RLE Weight Bearing Per Provider Order: Weight bearing as tolerated     Mobility  Bed Mobility Overal bed mobility: Needs Assistance, Modified Independent             General bed mobility comments: self able    Transfers Overall transfer level: Needs assistance Equipment used: Rolling walker (2 wheels) Transfers: Sit to/from Stand Sit to Stand: Supervision           General transfer comment: increased effort but self able and present with good safety cognition    Ambulation/Gait Ambulation/Gait assistance: Supervision, Contact guard assist Gait Distance (Feet): 92 Feet Assistive device: Rolling walker (2 wheels) Gait Pattern/deviations: Step-to pattern, Step-through pattern, Decreased step length - left Gait velocity: decreased     General Gait Details: <25% VC's on safety with turns and proper walker to self distance.  Feels like I want to fall backward sometimes, stated pt.  Instructed to shorten R step length.  Prior pt was amb with a cane but Educated need for walker for now as Camboot can throw off your balance and leg height.  Pt agreed.   Stairs Stairs: Yes Stairs assistance: Supervision, Contact guard assist Stair Management: Two rails, Step to pattern, Forwards Number of Stairs: 2 General stair comments: Instructed/Educated on proper sequencing up with the good and down with the bad stair training  2 steps using B rails.   Wheelchair Mobility     Tilt Bed    Modified Rankin (Stroke Patients Only)       Balance                                            Cognition Arousal: Alert Behavior During Therapy: WFL for tasks assessed/performed Overall Cognitive Status:  Within Functional Limits for tasks assessed                                 General Comments: AxO x 3 pleasant and bright Lady for her age.  Pt stated, she got herself dressed.        Exercises      General Comments        Pertinent Vitals/Pain Pain Assessment Pain Assessment: Faces Faces Pain Scale: Hurts a little bit Pain Location: right ankle Pain Descriptors / Indicators: Constant Pain Intervention(s): Monitored during session    Home Living                          Prior Function            PT Goals (current goals can now be found in the care plan section) Progress towards PT goals: Progressing toward goals    Frequency           PT Plan      Co-evaluation              AM-PAC PT 6 Clicks Mobility   Outcome Measure  Help needed turning from your back to your side while in a flat bed without using bedrails?: None Help needed moving from lying on your back to sitting on the side of a flat bed without using bedrails?: None Help needed moving to and from a bed to a chair (including a wheelchair)?: None Help needed standing up from a chair using your arms (e.g., wheelchair or bedside chair)?: None Help needed to walk in hospital room?: A Little Help needed climbing 3-5 steps with a railing? : A Little 6 Click Score: 22    End of Session Equipment Utilized During Treatment: Gait belt Activity Tolerance: Patient tolerated treatment well Patient left: in bed Nurse Communication: Mobility status PT Visit Diagnosis: Other abnormalities of gait and mobility (R26.89)     Time: 8866-8841 PT Time Calculation (min) (ACUTE ONLY): 25 min  Charges:    $Gait Training: 8-22 mins $Therapeutic Activity: 8-22 mins PT General Charges $$ ACUTE PT VISIT: 1 Visit                     Katheryn Leap  PTA Acute  Rehabilitation Services Office M-F          [phone]

## 2023-01-06 NOTE — Care Management Obs Status (Signed)
 MEDICARE OBSERVATION STATUS NOTIFICATION   Patient Details  Name: Lisa Lang MRN: [id number] Date of Birth: [birth year]   Medicare Observation Status Notification Given:  Yes    Adrian Prows, RN 01/06/2023, 9:26 AM

## 2023-01-06 NOTE — Discharge Summary (Signed)
 Physician Discharge Summary   Patient: Lisa Lang MRN: [id number] DOB: 10-Feb-1927  Admit date:     01/04/2023  Discharge date: 01/06/23  Discharge Physician: Sabas GORMAN Brod   PCP: Signa Rush, MD (Inactive)   Recommendations at discharge:   Follow-up orthopedics in 2 weeks  weightbearing as tolerated with a cam boot for comfort   Discharge Diagnoses: Principal Problem:   Fall, initial encounter  Resolved Problems:   * No resolved hospital problems. *  Hospital Course: 89 y.o. female with medical history significant for paroxysmal A-fib on Eliquis , mitral valve prolapse, hyperlipidemia, who presents after a mechanical fall at home.  The patient lives alone she uses a cane to ambulate at baseline.  She fell this morning.  Lost her balance.  No loss of consciousness.  She was in her usual state of health prior to this.  EMS was activated.  She was brought into the ED for further evaluation   Assessment and Plan:  Right ankle fracture post mechanical fall, POA -X-ray of ankle showed moderate lateral malleolar soft tissue swelling, acute nondisplaced fracture of the lateral talar process, just distal to the fibula  cam boot in place As needed analgesics Weightbearing as tolerated per orthopedic surgery PT OT assessment obtained, patient to go home with home health PT    Hyperlipidemia Resume home Lipitor   Mild leukocytosis Afebrile and nonseptic appearing Suspect reactive however will rule out infection Resolved   CKD 3B Appears to be at her baseline   History of atrial fibrillation -Patient takes Eliquis  at home Discussed with orthopedics, will restart Eliquis .          Consultants: Orthopedics Procedures performed:  Disposition: Home Diet recommendation:  Discharge Diet Orders (From admission, onward)     Start     Ordered   01/06/23 0000  Diet - low sodium heart healthy        01/06/23 1247           Regular diet DISCHARGE  MEDICATION: Allergies as of 01/06/2023       Reactions   Ciprofloxacin Diarrhea   Sulfa Antibiotics    unknown   Amoxicillin Rash   Lidocaine  Palpitations   unknown        Medication List     TAKE these medications    acetaminophen  325 MG tablet Commonly known as: TYLENOL  Take 2 tablets (650 mg total) by mouth every 4 (four) hours as needed.   amiodarone  200 MG tablet Commonly known as: PACERONE  TAKE 1 TABLET BY MOUTH TWICE DAILY OR AS DIRECTED   atorvastatin  40 MG tablet Commonly known as: LIPITOR Take 40 mg by mouth at bedtime.   calcium  carbonate 1250 (500 Ca) MG tablet Commonly known as: OS-CAL - dosed in mg of elemental calcium  Take 1 tablet by mouth daily with breakfast.   Eliquis  2.5 MG Tabs tablet Generic drug: apixaban  TAKE 1 TABLET(2.5 MG) BY MOUTH TWICE DAILY   fluorouracil 5 % cream Commonly known as: EFUDEX Apply topically 2 (two) times daily.   furosemide  20 MG tablet Commonly known as: LASIX  TAKE 1 TABLET BY MOUTH EVERY DAY AS NEEDED   metoprolol  succinate 25 MG 24 hr tablet Commonly known as: TOPROL -XL TAKE 1/2 TABLET(12.5 MG) BY MOUTH AT BEDTIME   MULTIVITAMIN PO Take 1 tablet by mouth daily.   oxyCODONE  5 MG immediate release tablet Commonly known as: Oxy IR/ROXICODONE  Take 0.5 tablets (2.5 mg total) by mouth every 6 (six) hours as needed for severe pain.  pantoprazole 40 MG tablet Commonly known as: PROTONIX Take 40 mg by mouth daily.   Vitamin D  (Ergocalciferol ) 50 MCG (2000 UT) Caps Take 2,000 Units by mouth daily.        Follow-up Information     [facility] Follow up.   Contact information: [address] [place] [address] [phone]         Genelle Standing, MD. Schedule an appointment as soon as possible for a visit in 2 week(s).   Specialty: Orthopedic Surgery Contact information: [address] [place] [address] [phone]                Discharge  Exam: Fredricka Weights   01/05/23 2323  Weight: 48.6 kg   General-appears in no acute distress Heart-S1-S2, regular, no murmur auscultated Lungs-clear to auscultation bilaterally, no wheezing or crackles auscultated Abdomen-soft, nontender, no organomegaly Extremities-no edema in the lower extremities Neuro-alert, oriented x3, no focal deficit noted  Condition at discharge: good  The results of significant diagnostics from this hospitalization (including imaging, microbiology, ancillary and laboratory) are listed below for reference.   Imaging Studies: CT Head Wo Contrast Result Date: 01/04/2023 CLINICAL DATA:  Head trauma, minor (Age >= 65y) EXAM: CT HEAD WITHOUT CONTRAST TECHNIQUE: Contiguous axial images were obtained from the base of the skull through the vertex without intravenous contrast. RADIATION DOSE REDUCTION: This exam was performed according to the departmental dose-optimization program which includes automated exposure control, adjustment of the mA and/or kV according to patient size and/or use of iterative reconstruction technique. COMPARISON:  CT head March 23, 2022. FINDINGS: Brain: No evidence of acute infarction, hemorrhage, hydrocephalus, extra-axial collection or mass lesion/mass effect. Cerebral atrophy. Vascular: Calcific atherosclerosis.  No hyperdense vessel. Skull: High frontal scalp contusion/laceration without fracture. Sinuses/Orbits: Clear sinuses.  No acute orbital findings. Other: No mastoid effusions. IMPRESSION: 1. No evidence of acute intracranial abnormality. 2. High frontal scalp contusion/laceration without fracture. Electronically Signed   By: Gilmore GORMAN Molt M.D.   On: 01/04/2023 18:51   DG Knee Complete 4 Views Left Result Date: 01/04/2023 CLINICAL DATA:  Fall.  Pain. EXAM: LEFT KNEE - COMPLETE 4+ VIEW COMPARISON:  None Available. FINDINGS: Mildly decreased bone mineralization. No joint effusion. Mild medial compartment joint space narrowing. No acute  fracture or dislocation. IMPRESSION: Mild medial compartment osteoarthritis. Electronically Signed   By: Tanda Lyons M.D.   On: 01/04/2023 18:40   DG Tibia/Fibula Right Result Date: 01/04/2023 CLINICAL DATA:  Fall.  Ankle pain. EXAM: RIGHT TIBIA AND FIBULA - 2 VIEW COMPARISON:  None Available. FINDINGS: Redemonstration of acute fracture of the lateral process of the talus as described on contemporaneous right ankle radiographs with less than 1 mm diastasis. Moderate lateral ankle soft tissue swelling. No acute fracture is seen within the tibia or fibula. IMPRESSION: Redemonstration of acute fracture of the lateral process of the talus as described on contemporaneous right ankle radiographs with less than 1 mm diastasis. No tibia or fibular fracture. Electronically Signed   By: Tanda Lyons M.D.   On: 01/04/2023 18:33   DG Ankle Complete Right Result Date: 01/04/2023 CLINICAL DATA:  Fall.  Right ankle pain and swelling. EXAM: RIGHT ANKLE - COMPLETE 3+ VIEW COMPARISON:  None Available. FINDINGS: There is diffuse decreased bone mineralization. Moderate lateral malleolar soft tissue swelling. On frontal view there appears to be mild lucency on 1 mm cortical step-off at the lateral talar process this distal to the fibula. No dislocation. Joint  spaces are preserved. Minimal chronic enthesopathic change at the Achilles insertion on the calcaneus. IMPRESSION: 1. Moderate lateral malleolar soft tissue swelling. 2. Acute nondisplaced fracture of the lateral talar process, just distal to the fibula. Electronically Signed   By: Tanda Lyons M.D.   On: 01/04/2023 18:30    Microbiology: Results for orders placed or performed during the hospital encounter of 11/20/21  Urine Culture     Status: Abnormal   Collection Time: 11/20/21  8:24 PM   Specimen: Urine, Clean Catch  Result Value Ref Range Status   Specimen Description   Final    URINE, CLEAN CATCH Performed at [facility], [address], [place], [place] [address]    Special Requests   Final    NONE Performed at [facility], [address], [place], [place] [address]    Culture (A)  Final    20,000 COLONIES/mL ESCHERICHIA COLI CRITICAL RESULT CALLED TO, READ BACK BY AND VERIFIED WITH: PHARMD G GADHIA [id number] AT 728 AM BY CM Performed at [facility], [address], [place], [place] [address]    Report Status 11/23/2021 FINAL  Final   Organism ID, Bacteria ESCHERICHIA COLI (A)  Final      Susceptibility   Escherichia coli - MIC*    AMPICILLIN <=2 SENSITIVE Sensitive     CEFAZOLIN <=4 SENSITIVE Sensitive     CEFEPIME <=0.12 SENSITIVE Sensitive     CEFTRIAXONE  <=0.25 SENSITIVE Sensitive     CIPROFLOXACIN <=0.25 SENSITIVE Sensitive     GENTAMICIN <=1 SENSITIVE Sensitive     IMIPENEM <=0.25 SENSITIVE Sensitive     NITROFURANTOIN <=16 SENSITIVE Sensitive     TRIMETH/SULFA <=20 SENSITIVE Sensitive     AMPICILLIN/SULBACTAM <=2 SENSITIVE Sensitive     PIP/TAZO <=4 SENSITIVE Sensitive     * 20,000 COLONIES/mL ESCHERICHIA COLI  Surgical PCR screen     Status: None   Collection Time: 11/23/21  5:49 AM   Specimen: Nasal Mucosa; Nasal Swab  Result Value Ref Range Status   MRSA, PCR NEGATIVE NEGATIVE Final   Staphylococcus aureus NEGATIVE NEGATIVE Final    Comment: (NOTE) The Xpert SA Assay (FDA approved for NASAL specimens in patients 76 years of age and older), is one component of a comprehensive surveillance program. It is not intended to diagnose infection nor to guide or monitor treatment. Performed at [facility], [address], [place], [place] [address]     Labs: CBC: Recent Labs  Lab 01/04/23 2116 01/06/23 0530  WBC 11.4* 9.5  NEUTROABS 9.1*  --   HGB 12.0 11.3*  HCT 36.5 34.1*  MCV 100.8* 100.3*  PLT 169 143*   Basic Metabolic Panel: Recent Labs  Lab 01/04/23 2116 01/06/23 0530  NA 136 133*  K 4.1 3.9  CL 103 102  CO2 27 26  GLUCOSE  100* 92  BUN 27* 28*  CREATININE 1.08* 1.16*  CALCIUM  8.5* 8.2*  MG  --  2.2  PHOS  --  3.3   Liver Function Tests: No results for input(s): AST, ALT, ALKPHOS, BILITOT, PROT, ALBUMIN in the last 168 hours. CBG: No results for input(s): GLUCAP in the last 168 hours.  Discharge time spent: greater than 30 minutes.  Signed: Sabas GORMAN Brod, MD [facility] 01/06/2023

## 2023-01-07 DIAGNOSIS — M1380 Other specified arthritis, unspecified site: Secondary | ICD-10-CM | POA: Diagnosis not present

## 2023-01-07 DIAGNOSIS — I480 Paroxysmal atrial fibrillation: Secondary | ICD-10-CM | POA: Diagnosis not present

## 2023-01-07 DIAGNOSIS — S82891A Other fracture of right lower leg, initial encounter for closed fracture: Secondary | ICD-10-CM | POA: Diagnosis not present

## 2023-01-07 DIAGNOSIS — Z7901 Long term (current) use of anticoagulants: Secondary | ICD-10-CM | POA: Diagnosis not present

## 2023-01-07 DIAGNOSIS — E785 Hyperlipidemia, unspecified: Secondary | ICD-10-CM | POA: Diagnosis not present

## 2023-01-07 DIAGNOSIS — I341 Nonrheumatic mitral (valve) prolapse: Secondary | ICD-10-CM | POA: Diagnosis not present

## 2023-01-07 DIAGNOSIS — W19XXXA Unspecified fall, initial encounter: Secondary | ICD-10-CM | POA: Diagnosis not present

## 2023-01-07 DIAGNOSIS — N1832 Chronic kidney disease, stage 3b: Secondary | ICD-10-CM | POA: Diagnosis not present

## 2023-01-11 DIAGNOSIS — I341 Nonrheumatic mitral (valve) prolapse: Secondary | ICD-10-CM | POA: Diagnosis not present

## 2023-01-11 DIAGNOSIS — N1832 Chronic kidney disease, stage 3b: Secondary | ICD-10-CM | POA: Diagnosis not present

## 2023-01-11 DIAGNOSIS — Z7901 Long term (current) use of anticoagulants: Secondary | ICD-10-CM | POA: Diagnosis not present

## 2023-01-11 DIAGNOSIS — W19XXXA Unspecified fall, initial encounter: Secondary | ICD-10-CM | POA: Diagnosis not present

## 2023-01-11 DIAGNOSIS — I480 Paroxysmal atrial fibrillation: Secondary | ICD-10-CM | POA: Diagnosis not present

## 2023-01-11 DIAGNOSIS — S82891A Other fracture of right lower leg, initial encounter for closed fracture: Secondary | ICD-10-CM | POA: Diagnosis not present

## 2023-01-12 ENCOUNTER — Encounter (HOSPITAL_BASED_OUTPATIENT_CLINIC_OR_DEPARTMENT_OTHER): Payer: Self-pay | Admitting: Student

## 2023-01-12 ENCOUNTER — Ambulatory Visit (HOSPITAL_BASED_OUTPATIENT_CLINIC_OR_DEPARTMENT_OTHER): Payer: Medicare Other | Admitting: Student

## 2023-01-12 DIAGNOSIS — S92101A Unspecified fracture of right talus, initial encounter for closed fracture: Secondary | ICD-10-CM

## 2023-01-12 NOTE — Progress Notes (Addendum)
 Chief Complaint: Right ankle injury     History of Present Illness:    Lisa Lang is a 89 y.o. female presenting today for follow-up of a right ankle injury sustained on 12/31.  This was due to a mechanical fall at home.  Unsure of the mechanism however thinks that her ankle twisted.  She was seen in the emergency department and was placed in a walking boot.  Reports that she has been using a walker for ambulation and was using a cane before the fall.  Does have significant pain in the ankle first thing in the morning as well as after long periods of inactivity but overall reports pain today at a 5/10.  She is on daily Eliquis  and reports bruising from her toes all the way up the lower leg to where the top of the boot sits.   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   PAF (paroxysmal atrial fibrillation) (HCC) 11/2017   CHA2DS2 VASc score > 3 (Age >11 and female sex), --> Eliquis  & Metoprolol     Past Surgical History:  Procedure Laterality Date   CARDIOVERSION N/A 10/28/2021   Procedure: CARDIOVERSION;  Surgeon: Hobart Powell BRAVO, MD;  Location: [facility] ENDOSCOPY;  Service: Cardiovascular;  Laterality: N/A;   CARDIOVERSION N/A 11/06/2021   Procedure: CARDIOVERSION;  Surgeon: Santo Stanly LABOR, MD;  Location: [facility] ENDOSCOPY;  Service: Cardiovascular;  Laterality: N/A;   CATARACT EXTRACTION, BILATERAL     CHOLECYSTECTOMY N/A 11/24/2021   Procedure: LAPAROSCOPIC CHOLECYSTECTOMY;  Surgeon: Eletha Boas, MD;  Location: [facility] ORS;  Service: General;  Laterality: N/A;   ERCP N/A 11/23/2021   Procedure: ENDOSCOPIC RETROGRADE CHOLANGIOPANCREATOGRAPHY (ERCP);  Surgeon: Saintclair Jasper, MD;  Location: [facility] ENDOSCOPY;  Service:  Gastroenterology;  Laterality: N/A;   REMOVAL OF STONES  11/23/2021   Procedure: REMOVAL OF STONES;  Surgeon: Saintclair Jasper, MD;  Location: [facility] ENDOSCOPY;  Service: Gastroenterology;;   [name]  11/23/2021   Procedure: [name];  Surgeon: Saintclair Jasper, MD;  Location: [facility] ENDOSCOPY;  Service: Gastroenterology;;   TEE WITHOUT CARDIOVERSION N/A 10/28/2021   Procedure: TRANSESOPHAGEAL ECHOCARDIOGRAM (TEE) - SYNCHRONIZED DIRECT CURRENT CARDIOVERSION (DCCV);  Surgeon: Hobart Powell BRAVO, MD;  Location: [facility] ENDOSCOPY;  Service: CV;; LVEF 65-70%. No RWMA . Mod LA dilation w/o thrombus. Normal RV, Mod RA dilation. 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 Bileaflet Prolapse w/ Large Post & small Ant Jet.= 3+/Mod MR - no PVV reversal. = DCCV   TRANSTHORACIC ECHOCARDIOGRAM  10/25/2021   Notably he was very elderly woman.  No acute distress.  Just seems worn out.   TRANSTHORACIC ECHOCARDIOGRAM  12/12/2019    EF 60 to 65%.  No R WMA.  Unable to assess diastolic pressures.  Normal RV pressures.  Mild biatrial enlargement.  Moderate aortic calcification - no AS.  Severe BI-Leaflet MVP with Mod-Severe MR   Social History   Socioeconomic History   Marital status: Married    Spouse name: Not on file   Number of children: Not on file   Years of education: Not  on file   Highest education level: Not on file  Occupational History   Occupation: Retired  Tobacco Use   Smoking status: Never   Smokeless tobacco: Never  Vaping Use   Vaping status: Never Used  Substance and Sexual Activity   Alcohol use: No   Drug use: No   Sexual activity: Not on file  Other Topics Concern   Not on file  Social History Narrative   Recently widowed November 2019--she had been the primary caregiver for her husband who had progressively worsening dementia.   Now lives alone and is planning to move into a retirement community potentially.  She handled his death well, and felt somewhat liberated having the  stress of being caregiver removed.   Social Drivers of Corporate Investment Banker Strain: Not on file  Food Insecurity: No Food Insecurity (01/06/2023)   Hunger Vital Sign    Worried About Running Out of Food in the Last Year: Never true    Ran Out of Food in the Last Year: Never true  Transportation Needs: No Transportation Needs (01/06/2023)   PRAPARE - Administrator, Civil Service (Medical): No    Lack of Transportation (Non-Medical): No  Physical Activity: Not on file  Stress: Not on file  Social Connections: Moderately Integrated (01/05/2023)   Social Connection and Isolation Panel [NHANES]    Frequency of Communication with Friends and Family: More than three times a week    Frequency of Social Gatherings with Friends and Family: More than three times a week    Attends Religious Services: More than 4 times per year    Active Member of Clubs or Organizations: No    Attends Banker Meetings: 1 to 4 times per year    Marital Status: Widowed   Family History  Problem Relation Age of Onset   Cervical cancer Mother    CAD Father    Lung cancer Sister    Stroke Neg Hx    Diabetes Neg Hx    Allergies  Allergen Reactions   Ciprofloxacin Diarrhea   Sulfa Antibiotics     unknown   Amoxicillin Rash   Lidocaine  Palpitations    unknown   Current Outpatient Medications  Medication Sig Dispense Refill   acetaminophen  (TYLENOL ) 325 MG tablet Take 2 tablets (650 mg total) by mouth every 4 (four) hours as needed.     amiodarone  (PACERONE ) 200 MG tablet TAKE 1 TABLET BY MOUTH TWICE DAILY OR AS DIRECTED 90 tablet 3   apixaban  (ELIQUIS ) 2.5 MG TABS tablet TAKE 1 TABLET(2.5 MG) BY MOUTH TWICE DAILY 180 tablet 1   atorvastatin  (LIPITOR) 40 MG tablet Take 40 mg by mouth at bedtime.     calcium  carbonate (OS-CAL - DOSED IN MG OF ELEMENTAL CALCIUM ) 1250 MG tablet Take 1 tablet by mouth daily with breakfast.     fluorouracil (EFUDEX) 5 % cream Apply topically 2 (two)  times daily.     furosemide  (LASIX ) 20 MG tablet TAKE 1 TABLET BY MOUTH EVERY DAY AS NEEDED (Patient not taking: Reported on 01/04/2023) 90 tablet 3   metoprolol  succinate (TOPROL -XL) 25 MG 24 hr tablet TAKE 1/2 TABLET(12.5 MG) BY MOUTH AT BEDTIME 45 tablet 0   Multiple Vitamins-Minerals (MULTIVITAMIN PO) Take 1 tablet by mouth daily.     oxyCODONE  (OXY IR/ROXICODONE ) 5 MG immediate release tablet Take 0.5 tablets (2.5 mg total) by mouth every 6 (six) hours as needed for severe pain. (Patient not taking: Reported on 01/04/2023)  15 tablet 0   pantoprazole (PROTONIX) 40 MG tablet Take 40 mg by mouth daily. (Patient not taking: Reported on 01/04/2023)     Vitamin D , Ergocalciferol , 2000 units CAPS Take 2,000 Units by mouth daily.     No current facility-administered medications for this visit.   No results found.  Review of Systems:   A ROS was performed including pertinent positives and negatives as documented in the HPI.  Physical Exam :   Constitutional: NAD and appears stated age Neurological: Alert and oriented Psych: Appropriate affect and cooperative There were no vitals taken for this visit.   Comprehensive Musculoskeletal Exam:    Right ankle exam demonstrates significant swelling of the lateral ankle and right foot.  Diffuse ecchymosis noted from the base of the toes approximately to the mid lower leg.  Tenderness to palpation over the lateral malleolus and lateral talus.  DP pulse palpable.  Distal neurosensory exam intact.  Imaging:   Xray review from 01/03/33 (right ankle 3 views): Acute fracture of the lateral talar process with notable lateral soft tissue edema.   I personally reviewed and interpreted the radiographs.   Assessment:   89 y.o. female with a lateral talar process fracture after an injury sustained just over 1 week ago.  This does not have significant displacement but regardless I discussed conservative treatment with the walking boot.  She can begin to  progress weightbearing as tolerated and transition back to use of a cane.  There is significant ecchymosis throughout the foot ankle and lower leg likely attributable to anticoagulation.  Adjusted walking boot to make sure this fits comfortably.  Encouraged ice and elevation.  I would like to see her back in 4 weeks for reassessment.  Plan :    - Return to clinic in 4 weeks for follow up     I personally saw and evaluated the patient, and participated in the management and treatment plan.  Leonce Reveal, PA-C Orthopedics

## 2023-01-14 DIAGNOSIS — W19XXXA Unspecified fall, initial encounter: Secondary | ICD-10-CM | POA: Diagnosis not present

## 2023-01-14 DIAGNOSIS — N1832 Chronic kidney disease, stage 3b: Secondary | ICD-10-CM | POA: Diagnosis not present

## 2023-01-14 DIAGNOSIS — I480 Paroxysmal atrial fibrillation: Secondary | ICD-10-CM | POA: Diagnosis not present

## 2023-01-14 DIAGNOSIS — I341 Nonrheumatic mitral (valve) prolapse: Secondary | ICD-10-CM | POA: Diagnosis not present

## 2023-01-14 DIAGNOSIS — S82891A Other fracture of right lower leg, initial encounter for closed fracture: Secondary | ICD-10-CM | POA: Diagnosis not present

## 2023-01-14 DIAGNOSIS — Z7901 Long term (current) use of anticoagulants: Secondary | ICD-10-CM | POA: Diagnosis not present

## 2023-01-18 DIAGNOSIS — S82891A Other fracture of right lower leg, initial encounter for closed fracture: Secondary | ICD-10-CM | POA: Diagnosis not present

## 2023-01-18 DIAGNOSIS — W19XXXA Unspecified fall, initial encounter: Secondary | ICD-10-CM | POA: Diagnosis not present

## 2023-01-18 DIAGNOSIS — I341 Nonrheumatic mitral (valve) prolapse: Secondary | ICD-10-CM | POA: Diagnosis not present

## 2023-01-18 DIAGNOSIS — I480 Paroxysmal atrial fibrillation: Secondary | ICD-10-CM | POA: Diagnosis not present

## 2023-01-18 DIAGNOSIS — Z7901 Long term (current) use of anticoagulants: Secondary | ICD-10-CM | POA: Diagnosis not present

## 2023-01-18 DIAGNOSIS — N1832 Chronic kidney disease, stage 3b: Secondary | ICD-10-CM | POA: Diagnosis not present

## 2023-01-19 ENCOUNTER — Other Ambulatory Visit: Payer: Self-pay | Admitting: Cardiology

## 2023-01-19 DIAGNOSIS — I480 Paroxysmal atrial fibrillation: Secondary | ICD-10-CM

## 2023-01-20 ENCOUNTER — Ambulatory Visit (HOSPITAL_BASED_OUTPATIENT_CLINIC_OR_DEPARTMENT_OTHER): Payer: Medicare Other | Admitting: Student

## 2023-01-20 ENCOUNTER — Encounter (HOSPITAL_BASED_OUTPATIENT_CLINIC_OR_DEPARTMENT_OTHER): Payer: Self-pay | Admitting: Student

## 2023-01-20 DIAGNOSIS — S92101A Unspecified fracture of right talus, initial encounter for closed fracture: Secondary | ICD-10-CM | POA: Diagnosis not present

## 2023-01-20 DIAGNOSIS — W19XXXA Unspecified fall, initial encounter: Secondary | ICD-10-CM | POA: Diagnosis not present

## 2023-01-20 DIAGNOSIS — I480 Paroxysmal atrial fibrillation: Secondary | ICD-10-CM | POA: Diagnosis not present

## 2023-01-20 DIAGNOSIS — I341 Nonrheumatic mitral (valve) prolapse: Secondary | ICD-10-CM | POA: Diagnosis not present

## 2023-01-20 DIAGNOSIS — S82891A Other fracture of right lower leg, initial encounter for closed fracture: Secondary | ICD-10-CM | POA: Diagnosis not present

## 2023-01-20 DIAGNOSIS — N1832 Chronic kidney disease, stage 3b: Secondary | ICD-10-CM | POA: Diagnosis not present

## 2023-01-20 DIAGNOSIS — Z7901 Long term (current) use of anticoagulants: Secondary | ICD-10-CM | POA: Diagnosis not present

## 2023-01-20 NOTE — Progress Notes (Signed)
Chief Complaint: Right ankle injury     History of Present Illness:   01/20/23: Patient presents today for follow-up of her right ankle.  She has been wearing the walking boot and states that recently this has began rubbing the skin on her ankle.  These areas are red and painful but denies any breakdown of the skin.  She has gotten some improvement in her swelling and bruising.  Taking Tylenol as needed for pain.  She is working with home health PT.    Lisa Lang is a 89 y.o. female presenting today for follow-up of a right ankle injury sustained on 12/31.  This was due to a mechanical fall at home.  Unsure of the mechanism however thinks that her ankle twisted.  She was seen in the emergency department and was placed in a walking boot.  Reports that she has been using a walker for ambulation and was using a cane before the fall.  Does have significant pain in the ankle first thing in the morning as well as after long periods of inactivity but overall reports pain today at a 5/10.  She is on daily Eliquis and reports bruising from her toes all the way up the lower leg to where the top of the boot sits.   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   PAF (paroxysmal atrial fibrillation) (HCC) 11/2017   CHA2DS2 VASc score > 3 (Age >11 and female sex), --> Eliquis & Metoprolol    Past Surgical History:  Procedure Laterality Date   CARDIOVERSION N/A 10/28/2021   Procedure: CARDIOVERSION;  Surgeon: Meriam Sprague, MD;  Location: [facility] ENDOSCOPY;  Service: Cardiovascular;  Laterality: N/A;   CARDIOVERSION N/A 11/06/2021   Procedure: CARDIOVERSION;  Surgeon: Christell Constant, MD;  Location: [facility] ENDOSCOPY;  Service:  Cardiovascular;  Laterality: N/A;   CATARACT EXTRACTION, BILATERAL     CHOLECYSTECTOMY N/A 11/24/2021   Procedure: LAPAROSCOPIC CHOLECYSTECTOMY;  Surgeon: Darnell Level, MD;  Location: [facility] ORS;  Service: General;  Laterality: N/A;   ERCP N/A 11/23/2021   Procedure: ENDOSCOPIC RETROGRADE CHOLANGIOPANCREATOGRAPHY (ERCP);  Surgeon: Kerin Salen, MD;  Location: [facility] ENDOSCOPY;  Service: Gastroenterology;  Laterality: N/A;   REMOVAL OF STONES  11/23/2021   Procedure: REMOVAL OF STONES;  Surgeon: Kerin Salen, MD;  Location: [facility] ENDOSCOPY;  Service: Gastroenterology;;   Dennison Mascot  11/23/2021   Procedure: Dennison Mascot;  Surgeon: Kerin Salen, MD;  Location: [facility] ENDOSCOPY;  Service: Gastroenterology;;   TEE WITHOUT CARDIOVERSION N/A 10/28/2021   Procedure: TRANSESOPHAGEAL ECHOCARDIOGRAM (TEE) - SYNCHRONIZED DIRECT CURRENT CARDIOVERSION (DCCV);  Surgeon: Meriam Sprague, MD;  Location: [facility] ENDOSCOPY;  Service: CV;; LVEF 65-70%. No RWMA . Mod LA dilation w/o thrombus. Normal RV, Mod RA dilation. 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 Bileaflet Prolapse w/ Large Post & small Ant Jet.= 3+/Mod MR - no PVV reversal. = DCCV   TRANSTHORACIC ECHOCARDIOGRAM  10/25/2021   Notably he was very elderly woman.  No acute distress.  Just seems worn out.   TRANSTHORACIC ECHOCARDIOGRAM  12/12/2019  EF 60 to 65%.  No R WMA.  Unable to assess diastolic pressures.  Normal RV pressures.  Mild biatrial enlargement.  Moderate aortic calcification - no AS.  Severe BI-Leaflet MVP with Mod-Severe MR   Social History   Socioeconomic History   Marital status: Married    Spouse name: Not on file   Number of children: Not on file   Years of education: Not on file   Highest education level: Not on file  Occupational History   Occupation: Retired  Tobacco Use   Smoking status: Never   Smokeless tobacco: Never  Vaping Use   Vaping status: Never Used  Substance and Sexual Activity   Alcohol use: No   Drug  use: No   Sexual activity: Not on file  Other Topics Concern   Not on file  Social History Narrative   Recently widowed November 2019--she had been the primary caregiver for her husband who had progressively worsening dementia.   Now lives alone and is planning to move into a retirement community potentially.  She handled his death well, and felt somewhat liberated having the stress of being caregiver removed.   Social Drivers of Corporate investment banker Strain: Not on file  Food Insecurity: No Food Insecurity (01/06/2023)   Hunger Vital Sign    Worried About Running Out of Food in the Last Year: Never true    Ran Out of Food in the Last Year: Never true  Transportation Needs: No Transportation Needs (01/06/2023)   PRAPARE - Administrator, Civil Service (Medical): No    Lack of Transportation (Non-Medical): No  Physical Activity: Not on file  Stress: Not on file  Social Connections: Moderately Integrated (01/05/2023)   Social Connection and Isolation Panel [NHANES]    Frequency of Communication with Friends and Family: More than three times a week    Frequency of Social Gatherings with Friends and Family: More than three times a week    Attends Religious Services: More than 4 times per year    Active Member of Clubs or Organizations: No    Attends Banker Meetings: 1 to 4 times per year    Marital Status: Widowed   Family History  Problem Relation Age of Onset   Cervical cancer Mother    CAD Father    Lung cancer Sister    Stroke Neg Hx    Diabetes Neg Hx    Allergies  Allergen Reactions   Ciprofloxacin Diarrhea   Sulfa Antibiotics     unknown   Amoxicillin Rash   Lidocaine Palpitations    unknown   Current Outpatient Medications  Medication Sig Dispense Refill   acetaminophen (TYLENOL) 325 MG tablet Take 2 tablets (650 mg total) by mouth every 4 (four) hours as needed.     amiodarone (PACERONE) 200 MG tablet TAKE 1 TABLET BY MOUTH TWICE DAILY  OR AS DIRECTED 90 tablet 3   apixaban (ELIQUIS) 2.5 MG TABS tablet TAKE 1 TABLET TWICE A DAY 180 tablet 1   atorvastatin (LIPITOR) 40 MG tablet Take 40 mg by mouth at bedtime.     calcium carbonate (OS-CAL - DOSED IN MG OF ELEMENTAL CALCIUM) 1250 MG tablet Take 1 tablet by mouth daily with breakfast.     fluorouracil (EFUDEX) 5 % cream Apply topically 2 (two) times daily.     furosemide (LASIX) 20 MG tablet TAKE 1 TABLET BY MOUTH EVERY DAY AS NEEDED (Patient not taking: Reported on 01/04/2023) 90 tablet  3   metoprolol succinate (TOPROL-XL) 25 MG 24 hr tablet TAKE 1/2 TABLET(12.5 MG) BY MOUTH AT BEDTIME 45 tablet 0   Multiple Vitamins-Minerals (MULTIVITAMIN PO) Take 1 tablet by mouth daily.     oxyCODONE (OXY IR/ROXICODONE) 5 MG immediate release tablet Take 0.5 tablets (2.5 mg total) by mouth every 6 (six) hours as needed for severe pain. (Patient not taking: Reported on 01/04/2023) 15 tablet 0   pantoprazole (PROTONIX) 40 MG tablet Take 40 mg by mouth daily. (Patient not taking: Reported on 01/04/2023)     Vitamin D, Ergocalciferol, 2000 units CAPS Take 2,000 Units by mouth daily.     No current facility-administered medications for this visit.   No results found.  Review of Systems:   A ROS was performed including pertinent positives and negatives as documented in the HPI.  Physical Exam :   Constitutional: NAD and appears stated age Neurological: Alert and oriented Psych: Appropriate affect and cooperative There were no vitals taken for this visit.   Comprehensive Musculoskeletal Exam:    Patient ambulating in walking boot with assistance of a walker.  Diffuse ecchymosis over the right ankle and lower leg.  No calf tenderness.  Increased erythema noted of the lateral and medial malleolus without any blistering or skin breakdown.  DP pulse 2+.  Distal sensation intact.  Imaging:     Assessment:   89 y.o. female 2 weeks status post fracture to the lateral talar process of the right  ankle.  She has been compliant with usage of the cam boot however this does appear to be causing friction and irritation particularly over the medial and lateral malleolus.  I did place some moleskin for added padding as well as placed a bandage over the irritated area to allow healing.  Also adjusted strap tension and air in the boot to prevent the foot from moving.  I have recommended removing the boot at home when nonweightbearing to alleviate pressure on the skin.  I would like for her to continue to monitor this and should this persist or continue to worsen I would like to see her back for evaluation.  Plan :    - Return to clinic on 02/06/22 for next post op or sooner if needed     I personally saw and evaluated the patient, and participated in the management and treatment plan.  Hazle Nordmann, PA-C Orthopedics

## 2023-01-20 NOTE — Telephone Encounter (Signed)
Prescription refill request for Eliquis received. Indication: Afib  Last office visit: 04/21/22 ([place])  Scr: 1.16 (01/06/23)  Age: 89 Weight: 48.6kg  Appropriate dose. Refill sent.

## 2023-01-21 ENCOUNTER — Emergency Department (HOSPITAL_COMMUNITY): Payer: Medicare Other

## 2023-01-21 ENCOUNTER — Other Ambulatory Visit: Payer: Self-pay

## 2023-01-21 ENCOUNTER — Emergency Department (HOSPITAL_COMMUNITY)
Admission: EM | Admit: 2023-01-21 | Discharge: 2023-01-21 | Disposition: A | Discharge status: Home / Self Care | Payer: Medicare Other | Attending: Emergency Medicine | Admitting: Emergency Medicine

## 2023-01-21 DIAGNOSIS — I509 Heart failure, unspecified: Secondary | ICD-10-CM | POA: Diagnosis not present

## 2023-01-21 DIAGNOSIS — Z7901 Long term (current) use of anticoagulants: Secondary | ICD-10-CM | POA: Insufficient documentation

## 2023-01-21 DIAGNOSIS — M47812 Spondylosis without myelopathy or radiculopathy, cervical region: Secondary | ICD-10-CM | POA: Diagnosis not present

## 2023-01-21 DIAGNOSIS — J849 Interstitial pulmonary disease, unspecified: Secondary | ICD-10-CM | POA: Diagnosis not present

## 2023-01-21 DIAGNOSIS — I700 Atherosclerosis of aorta: Secondary | ICD-10-CM | POA: Diagnosis not present

## 2023-01-21 DIAGNOSIS — M1990 Unspecified osteoarthritis, unspecified site: Secondary | ICD-10-CM | POA: Diagnosis not present

## 2023-01-21 DIAGNOSIS — M4312 Spondylolisthesis, cervical region: Secondary | ICD-10-CM | POA: Diagnosis not present

## 2023-01-21 DIAGNOSIS — S0101XA Laceration without foreign body of scalp, initial encounter: Secondary | ICD-10-CM | POA: Diagnosis not present

## 2023-01-21 DIAGNOSIS — W19XXXA Unspecified fall, initial encounter: Secondary | ICD-10-CM | POA: Diagnosis not present

## 2023-01-21 DIAGNOSIS — Z043 Encounter for examination and observation following other accident: Secondary | ICD-10-CM | POA: Diagnosis not present

## 2023-01-21 DIAGNOSIS — S0990XA Unspecified injury of head, initial encounter: Secondary | ICD-10-CM | POA: Diagnosis not present

## 2023-01-21 DIAGNOSIS — I672 Cerebral atherosclerosis: Secondary | ICD-10-CM | POA: Diagnosis not present

## 2023-01-21 DIAGNOSIS — S199XXA Unspecified injury of neck, initial encounter: Secondary | ICD-10-CM | POA: Diagnosis not present

## 2023-01-21 DIAGNOSIS — W0110XA Fall on same level from slipping, tripping and stumbling with subsequent striking against unspecified object, initial encounter: Secondary | ICD-10-CM | POA: Insufficient documentation

## 2023-01-21 LAB — I-STAT CHEM 8, ED
BUN: 36 mg/dL — ABNORMAL HIGH (ref 8–23)
Calcium, Ion: 1.09 mmol/L — ABNORMAL LOW (ref 1.15–1.40)
Chloride: 105 mmol/L (ref 98–111)
Creatinine, Ser: 1.4 mg/dL — ABNORMAL HIGH (ref 0.44–1.00)
Glucose, Bld: 97 mg/dL (ref 70–99)
HCT: 37 % (ref 36.0–46.0)
Hemoglobin: 12.6 g/dL (ref 12.0–15.0)
Potassium: 4.4 mmol/L (ref 3.5–5.1)
Sodium: 141 mmol/L (ref 135–145)
TCO2: 28 mmol/L (ref 22–32)

## 2023-01-21 LAB — I-STAT CG4 LACTIC ACID, ED: Lactic Acid, Venous: 1.1 mmol/L (ref 0.5–1.9)

## 2023-01-21 NOTE — Progress Notes (Signed)
Orthopedic Tech Progress Note Patient Details:  SILVIE ALLBRIGHT 1927-08-02 [id number]  Patient ID: Derenda Fennel, female   DOB: 01/05/1927, 89 y.o.   MRN: [id number] Level 2 trauma Tonye Pearson 01/21/2023, 7:03 PM

## 2023-01-21 NOTE — ED Provider Notes (Signed)
[name] Provider Note  CSN: [id number] Arrival date & time: 01/21/23 1841  Chief Complaint(s) No chief complaint on file.  HPI Lisa Lang is a 89 y.o. female history of atrial fibrillation, on Eliquis presenting to the emergency department with fall.  Patient reports she tripped, fell hitting her head on the ground.  Did have some bleeding.  No loss of consciousness.  No pain in her arms or legs.  No headaches.  No nausea or vomiting.  No other symptoms.   Past Medical History Past Medical History:  Diagnosis Date   Arthritis    Diverticulitis 1995   And 2007   Dysrhythmia    Hyperlipidemia LDL goal <100    Mitral valve prolapse    Bileaflet MVP with moderate MR.   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 Authorizing Provider  acetaminophen (TYLENOL) 325 MG tablet Take 2 tablets (650 mg total) by mouth every 4 (four) hours as needed. 11/25/21   Lewie Chamber, MD  amiodarone  (PACERONE) 200 MG tablet TAKE 1 TABLET BY MOUTH TWICE DAILY OR AS DIRECTED 08/03/22   Marykay Lex, MD  apixaban Everlene Balls) 2.5 MG TABS tablet TAKE 1 TABLET TWICE A DAY 01/20/23   Marykay Lex, MD  atorvastatin (LIPITOR) 40 MG tablet Take 40 mg by mouth at bedtime. 09/05/17   [provider]  calcium carbonate (OS-CAL - DOSED IN MG OF ELEMENTAL CALCIUM) 1250 MG tablet Take 1 tablet by mouth daily with breakfast.    [provider]  fluorouracil (EFUDEX) 5 % cream Apply topically 2 (two) times daily. 09/03/22   [provider]  furosemide (LASIX) 20 MG tablet TAKE 1 TABLET BY MOUTH EVERY DAY AS NEEDED Patient not taking: Reported on 01/04/2023 07/05/22   Marykay Lex, MD  metoprolol succinate (TOPROL-XL) 25 MG 24 hr tablet TAKE 1/2 TABLET(12.5 MG) BY MOUTH AT BEDTIME 12/01/22   Marykay Lex, MD  Multiple Vitamins-Minerals (MULTIVITAMIN PO) Take 1 tablet by mouth daily.    [provider]  oxyCODONE (OXY IR/ROXICODONE) 5 MG immediate release tablet Take 0.5 tablets (2.5 mg total) by mouth every 6 (six) hours as needed for severe pain. Patient not taking: Reported on 01/04/2023 11/25/21   Lewie Chamber, MD  pantoprazole (PROTONIX) 40 MG tablet Take 40 mg by mouth daily. Patient not taking: Reported on 01/04/2023 01/11/22   [provider]  Vitamin D, Ergocalciferol, 2000 units CAPS Take 2,000 Units by mouth daily.    [provider]  Past Surgical History Past Surgical History:  Procedure Laterality Date   CARDIOVERSION N/A 10/28/2021   Procedure: CARDIOVERSION;  Surgeon: Meriam Sprague, MD;  Location: [facility] ENDOSCOPY;  Service: Cardiovascular;  Laterality: N/A;   CARDIOVERSION N/A 11/06/2021   Procedure: CARDIOVERSION;  Surgeon: Christell Constant, MD;  Location: [facility] ENDOSCOPY;  Service: Cardiovascular;   Laterality: N/A;   CATARACT EXTRACTION, BILATERAL     CHOLECYSTECTOMY N/A 11/24/2021   Procedure: LAPAROSCOPIC CHOLECYSTECTOMY;  Surgeon: Darnell Level, MD;  Location: [facility] ORS;  Service: General;  Laterality: N/A;   ERCP N/A 11/23/2021   Procedure: ENDOSCOPIC RETROGRADE CHOLANGIOPANCREATOGRAPHY (ERCP);  Surgeon: Kerin Salen, MD;  Location: [facility] ENDOSCOPY;  Service: Gastroenterology;  Laterality: N/A;   REMOVAL OF STONES  11/23/2021   Procedure: REMOVAL OF STONES;  Surgeon: Kerin Salen, MD;  Location: [facility] ENDOSCOPY;  Service: Gastroenterology;;   Dennison Mascot  11/23/2021   Procedure: Dennison Mascot;  Surgeon: Kerin Salen, MD;  Location: [facility] ENDOSCOPY;  Service: Gastroenterology;;   TEE WITHOUT CARDIOVERSION N/A 10/28/2021   Procedure: TRANSESOPHAGEAL ECHOCARDIOGRAM (TEE) - SYNCHRONIZED DIRECT CURRENT CARDIOVERSION (DCCV);  Surgeon: Meriam Sprague, MD;  Location: [facility] ENDOSCOPY;  Service: CV;; LVEF 65-70%. No RWMA . Mod LA dilation w/o thrombus. Normal RV, Mod RA dilation. 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 Bileaflet Prolapse w/ Large Post & small Ant Jet.= 3+/Mod MR - no PVV reversal. = DCCV   TRANSTHORACIC ECHOCARDIOGRAM  10/25/2021   Notably he was very elderly woman.  No acute distress.  Just seems worn out.   TRANSTHORACIC ECHOCARDIOGRAM  12/12/2019    EF 60 to 65%.  No R WMA.  Unable to assess diastolic pressures.  Normal RV pressures.  Mild biatrial enlargement.  Moderate aortic calcification - no AS.  Severe BI-Leaflet MVP with Mod-Severe MR   Family History Family History  Problem Relation Age of Onset   Cervical cancer Mother    CAD Father    Lung cancer Sister    Stroke Neg Hx    Diabetes Neg Hx     Social History Social History   Tobacco Use   Smoking status: Never   Smokeless tobacco: Never  Vaping Use   Vaping status: Never Used  Substance Use Topics   Alcohol use: No   Drug use: No   Allergies Ciprofloxacin, Sulfa antibiotics, Amoxicillin, and  Lidocaine  Review of Systems Review of Systems  All other systems reviewed and are negative.   Physical Exam Vital Signs  I have reviewed the triage vital signs BP (!) 154/64 (BP Location: Right Arm)   Pulse (!) 58   Temp 98 F (36.7 C) (Oral)   Resp 16   SpO2 98%  Physical Exam Vitals and nursing note reviewed.  Constitutional:      General: She is not in acute distress.    Appearance: She is well-developed.  HENT:     Head:     Comments: Unusual appearing large scab over the scalp, trace dried blood from this.  No active bleeding.    Mouth/Throat:     Mouth: Mucous membranes are moist.  Eyes:     Pupils: Pupils are equal, round, and reactive to light.  Cardiovascular:     Rate and Rhythm: Normal rate and regular rhythm.     Heart sounds: No murmur heard. Pulmonary:     Effort: Pulmonary effort is normal. No respiratory distress.     Breath sounds: Normal breath sounds.  Abdominal:     General: Abdomen is flat.  Palpations: Abdomen is soft.     Tenderness: There is no abdominal tenderness.  Musculoskeletal:        General: No tenderness.     Right lower leg: No edema.     Left lower leg: No edema.     Comments: No midline C, T, L-spine tenderness.  No chest wall tenderness or crepitus.  Full painless range of motion at the bilateral upper extremities including the shoulders, elbows, wrists, hand and fingers, and in the bilateral lower extremities including the hips, knees, ankle, toes.  No focal bony tenderness, injury or deformity.  Skin:    General: Skin is warm and dry.  Neurological:     General: No focal deficit present.     Mental Status: She is alert. Mental status is at baseline.  Psychiatric:        Mood and Affect: Mood normal.        Behavior: Behavior normal.     ED Results and Treatments Labs (all labs ordered are listed, but only abnormal results are displayed) Labs Reviewed  I-STAT CHEM 8, ED - Abnormal; Notable for the following  components:      Result Value   BUN 36 (*)    Creatinine, Ser 1.40 (*)    Calcium, Ion 1.09 (*)    All other components within normal limits  I-STAT CG4 LACTIC ACID, ED                                                                                                                          Radiology CT Cervical Spine Wo Contrast Result Date: 01/21/2023 CLINICAL DATA:  Larey Seat, anticoagulated, neck trauma EXAM: CT CERVICAL SPINE WITHOUT CONTRAST TECHNIQUE: Multidetector CT imaging of the cervical spine was performed without intravenous contrast. Multiplanar CT image reconstructions were also generated. RADIATION DOSE REDUCTION: This exam was performed according to the departmental dose-optimization program which includes automated exposure control, adjustment of the mA and/or kV according to patient size and/or use of iterative reconstruction technique. COMPARISON:  03/23/2022 FINDINGS: Alignment: Stable mild anterolisthesis of C4 on C5. Otherwise alignment is anatomic. Skull base and vertebrae: No acute fracture. No primary bone lesion or focal pathologic process. Soft tissues and spinal canal: No prevertebral fluid or swelling. No visible canal hematoma. Disc levels: Multilevel facet hypertrophy greatest from C3-4 through C5-6. Stable moderate spondylosis at the C5-6 level. Upper chest: Airway is patent. Stable biapical pleural and parenchymal scarring. Other: Reconstructed images demonstrate no additional findings. IMPRESSION: 1. No acute cervical spine fracture. 2. Stable multilevel cervical degenerative changes. Electronically Signed   By: Sharlet Salina M.D.   On: 01/21/2023 20:03   CT Head Wo Contrast Result Date: 01/21/2023 CLINICAL DATA:  Larey Seat, anticoagulated EXAM: CT HEAD WITHOUT CONTRAST TECHNIQUE: Contiguous axial images were obtained from the base of the skull through the vertex without intravenous contrast. RADIATION DOSE REDUCTION: This exam was performed according to the departmental  dose-optimization program which includes automated exposure control, adjustment of the  mA and/or kV according to patient size and/or use of iterative reconstruction technique. COMPARISON:  01/04/2023 FINDINGS: Brain: No acute infarct or hemorrhage. Lateral ventricles and midline structures are stable. No acute extra-axial fluid collections. No mass effect. Vascular: Stable atherosclerosis.  No hyperdense vessel. Skull: Left frontal scalp laceration. No underlying fracture. The remainder of the calvarium is unremarkable. Sinuses/Orbits: No acute finding. Other: None. IMPRESSION: 1. Left frontal scalp laceration. 2. No acute intracranial process. Electronically Signed   By: Sharlet Salina M.D.   On: 01/21/2023 20:00   DG Pelvis Portable Result Date: 01/21/2023 CLINICAL DATA:  Larey Seat EXAM: PORTABLE PELVIS 1-2 VIEWS COMPARISON:  11/20/2021 FINDINGS: Supine frontal view of the pelvis includes both hips. No acute displaced fracture, subluxation, or dislocation. Mild symmetrical bilateral hip osteoarthritis. Sacroiliac joints are normal. IMPRESSION: 1. Stable osteoarthritis.  No acute fracture. Electronically Signed   By: Sharlet Salina M.D.   On: 01/21/2023 19:17   DG Chest Port 1 View Result Date: 01/21/2023 CLINICAL DATA:  Status post fall. EXAM: PORTABLE CHEST 1 VIEW COMPARISON:  01/06/2022 FINDINGS: Stable cardiomediastinal contours. Aortic atherosclerosis. No pleural fluid, interstitial edema or airspace consolidation. Chronic coarsened interstitial markings are noted bilaterally. The visualized osseous structures appear grossly intact. IMPRESSION: 1. No acute findings. 2. Chronic interstitial lung disease. Electronically Signed   By: Signa Kell M.D.   On: 01/21/2023 19:16    Pertinent labs & imaging results that were available during my care of the patient were reviewed by me and considered in my medical decision making (see MDM for details).  Medications Ordered in ED Medications - No data to display                                                                                                                                    Procedures Procedures  (including critical care time)  Medical Decision Making / ED Course   MDM:  89 year old with fall.  Patient reports mechanical fall, tripped.  She recently had a ankle fracture and is in a cam boot.  This got caught on something and then she fell and hit her head.  She is overall very well-appearing and denies any pain or any symptoms at this time.  Given that she is on Eliquis and struck her head, CT scans were obtained with no evidence of intracranial bleeding, cervical spine fracture.  Patient request discharge.  She reports that she has had a tetanus shot in the last 5 years.  She does have a large scab on her scalp, she reports that this has been present for at least a year, she has been following with a dermatologist Dr. Margo Aye for this.  She has been told this is noncancerous but she is not sure if she remembers what it was called..  She has some dried blood coming from underneath the scab but no active bleeding.  I think unroofing this would possibly cause additional bleeding  and she reports this is a chronic process so we have left this in place.  I instructed patient to immediately return if she develops recurrent bleeding.  Will discharge patient to home. All questions answered. Patient comfortable with plan of discharge. Return precautions discussed with patient and specified on the after visit summary.       Additional history obtained: -Additional history obtained from ems   Lab Tests: -I ordered, reviewed, and interpreted labs.   The pertinent results include:   Labs Reviewed  I-STAT CHEM 8, ED - Abnormal; Notable for the following components:      Result Value   BUN 36 (*)    Creatinine, Ser 1.40 (*)    Calcium, Ion 1.09 (*)    All other components within normal limits  I-STAT CG4 LACTIC ACID, ED    Notable for  mild elevation in cr, with hx of CKD. Normal lactate     Imaging Studies ordered: I ordered imaging studies including CXR, CT head, CT cervical spine  On my interpretation imaging demonstrates no fracture  I independently visualized and interpreted imaging. I agree with the radiologist interpretation   Medicines ordered and prescription drug management: No orders of the defined types were placed in this encounter.   -I have reviewed the patients home medicines and have made adjustments as needed  Social Determinants of Health:  Diagnosis or treatment significantly limited by social determinants of health: lives alone   Reevaluation: After the interventions noted above, I reevaluated the patient and found that their symptoms have improved  Co morbidities that complicate the patient evaluation  Past Medical History:  Diagnosis Date   Arthritis    Diverticulitis 1995   And 2007   Dysrhythmia    Hyperlipidemia LDL goal <100    Mitral valve prolapse    Bileaflet MVP with moderate MR.   PAF (paroxysmal atrial fibrillation) (HCC) 11/2017   CHA2DS2 VASc score > 3 (Age >69 and female sex), --> Eliquis & Metoprolol       Dispostion: Disposition decision including need for hospitalization was considered, and patient discharged from emergency department.    Final Clinical Impression(s) / ED Diagnoses Final diagnoses:  Traumatic injury of head, initial encounter     This chart was dictated using voice recognition software.  Despite best efforts to proofread,  errors can occur which can change the documentation meaning.    Lonell Grandchild, MD 01/21/23 [phone]

## 2023-01-21 NOTE — Progress Notes (Signed)
   01/21/23 1923  Spiritual Encounters  Type of Visit Initial  Care provided to: Patient  Conversation partners present during encounter Nurse  Referral source Trauma page  Reason for visit Trauma  OnCall Visit Yes   Chaplain responding to Level 2 Trauma page, Fall on [place].  Pt was alert and conscious when I entered the room.  Chaplain spoke briefly with Pt about any contact persons we may need to contact. Pt states, "my son lives in [place]" .  She noted that her neighbors are her support people and they are aware of where she is.  Chaplain services remain available by Spiritual Consult or for emergent cases, paging [phone]  Chaplain Raelene Bott, MDiv [place] [email or web address] [phone]

## 2023-01-21 NOTE — ED Triage Notes (Signed)
Pt arrived via [facility] from home for a mechanical fall. Pt tripped and fell and hit head on door. Pt denies LOC or pain. Pt on eliquis. Pt has scab on head that is bleeding a little

## 2023-01-21 NOTE — Discharge Instructions (Addendum)
We evaluated you after your fall.  Your CT scans were negative.  We did notice some bleeding under your scalp rash, but since this is stopped on its own, we do not think removing the scab would be helpful.  If you do notice increased bleeding, please return immediately to the emergency department.

## 2023-01-25 DIAGNOSIS — Z7901 Long term (current) use of anticoagulants: Secondary | ICD-10-CM | POA: Diagnosis not present

## 2023-01-25 DIAGNOSIS — I480 Paroxysmal atrial fibrillation: Secondary | ICD-10-CM | POA: Diagnosis not present

## 2023-01-25 DIAGNOSIS — N1832 Chronic kidney disease, stage 3b: Secondary | ICD-10-CM | POA: Diagnosis not present

## 2023-01-25 DIAGNOSIS — S82891A Other fracture of right lower leg, initial encounter for closed fracture: Secondary | ICD-10-CM | POA: Diagnosis not present

## 2023-01-25 DIAGNOSIS — I341 Nonrheumatic mitral (valve) prolapse: Secondary | ICD-10-CM | POA: Diagnosis not present

## 2023-01-25 DIAGNOSIS — W19XXXA Unspecified fall, initial encounter: Secondary | ICD-10-CM | POA: Diagnosis not present

## 2023-01-26 ENCOUNTER — Telehealth: Payer: Self-pay | Admitting: Student

## 2023-01-26 NOTE — Telephone Encounter (Signed)
Charrisse (PT) from [facility] called with updates on pt. Pt cam boot is causing pt to have redness and scare forming on chin from boot. Pt complaint of boot being to big and pt had a fall last Friday. Charrisse secure number is [phone]. Also Charrisee states pt skin is already thin from pt being on blood thinner. Not sure what else she can do.

## 2023-01-26 NOTE — Telephone Encounter (Signed)
Spoke with [employer]  Verbal orders given to continue home physical therapy.  Will begin to have patient transition out of the boot as tolerated.

## 2023-01-27 DIAGNOSIS — R262 Difficulty in walking, not elsewhere classified: Secondary | ICD-10-CM | POA: Diagnosis not present

## 2023-01-27 DIAGNOSIS — R636 Underweight: Secondary | ICD-10-CM | POA: Diagnosis not present

## 2023-01-27 DIAGNOSIS — R030 Elevated blood-pressure reading, without diagnosis of hypertension: Secondary | ICD-10-CM | POA: Diagnosis not present

## 2023-01-27 DIAGNOSIS — I480 Paroxysmal atrial fibrillation: Secondary | ICD-10-CM | POA: Diagnosis not present

## 2023-01-27 DIAGNOSIS — L989 Disorder of the skin and subcutaneous tissue, unspecified: Secondary | ICD-10-CM | POA: Diagnosis not present

## 2023-01-28 ENCOUNTER — Telehealth: Payer: Self-pay

## 2023-01-28 DIAGNOSIS — S82891A Other fracture of right lower leg, initial encounter for closed fracture: Secondary | ICD-10-CM | POA: Diagnosis not present

## 2023-01-28 DIAGNOSIS — I341 Nonrheumatic mitral (valve) prolapse: Secondary | ICD-10-CM | POA: Diagnosis not present

## 2023-01-28 DIAGNOSIS — N1832 Chronic kidney disease, stage 3b: Secondary | ICD-10-CM | POA: Diagnosis not present

## 2023-01-28 DIAGNOSIS — Z7901 Long term (current) use of anticoagulants: Secondary | ICD-10-CM | POA: Diagnosis not present

## 2023-01-28 DIAGNOSIS — W19XXXA Unspecified fall, initial encounter: Secondary | ICD-10-CM | POA: Diagnosis not present

## 2023-01-28 DIAGNOSIS — I480 Paroxysmal atrial fibrillation: Secondary | ICD-10-CM | POA: Diagnosis not present

## 2023-01-28 NOTE — Telephone Encounter (Signed)
Lisa Lang with [facility] would like a call back concerning bandage needing to stay on until visit or come off.  CB# [phone].  Please advise.  Thank you.

## 2023-01-31 DIAGNOSIS — S82891A Other fracture of right lower leg, initial encounter for closed fracture: Secondary | ICD-10-CM | POA: Diagnosis not present

## 2023-01-31 DIAGNOSIS — W19XXXA Unspecified fall, initial encounter: Secondary | ICD-10-CM | POA: Diagnosis not present

## 2023-01-31 DIAGNOSIS — N1832 Chronic kidney disease, stage 3b: Secondary | ICD-10-CM | POA: Diagnosis not present

## 2023-01-31 DIAGNOSIS — I341 Nonrheumatic mitral (valve) prolapse: Secondary | ICD-10-CM | POA: Diagnosis not present

## 2023-01-31 DIAGNOSIS — I480 Paroxysmal atrial fibrillation: Secondary | ICD-10-CM | POA: Diagnosis not present

## 2023-01-31 DIAGNOSIS — Z7901 Long term (current) use of anticoagulants: Secondary | ICD-10-CM | POA: Diagnosis not present

## 2023-02-01 DIAGNOSIS — I341 Nonrheumatic mitral (valve) prolapse: Secondary | ICD-10-CM | POA: Diagnosis not present

## 2023-02-01 DIAGNOSIS — W19XXXA Unspecified fall, initial encounter: Secondary | ICD-10-CM | POA: Diagnosis not present

## 2023-02-01 DIAGNOSIS — I480 Paroxysmal atrial fibrillation: Secondary | ICD-10-CM | POA: Diagnosis not present

## 2023-02-01 DIAGNOSIS — N1832 Chronic kidney disease, stage 3b: Secondary | ICD-10-CM | POA: Diagnosis not present

## 2023-02-01 DIAGNOSIS — Z7901 Long term (current) use of anticoagulants: Secondary | ICD-10-CM | POA: Diagnosis not present

## 2023-02-01 DIAGNOSIS — S82891A Other fracture of right lower leg, initial encounter for closed fracture: Secondary | ICD-10-CM | POA: Diagnosis not present

## 2023-02-03 DIAGNOSIS — I341 Nonrheumatic mitral (valve) prolapse: Secondary | ICD-10-CM | POA: Diagnosis not present

## 2023-02-03 DIAGNOSIS — S82891A Other fracture of right lower leg, initial encounter for closed fracture: Secondary | ICD-10-CM | POA: Diagnosis not present

## 2023-02-03 DIAGNOSIS — I480 Paroxysmal atrial fibrillation: Secondary | ICD-10-CM | POA: Diagnosis not present

## 2023-02-03 DIAGNOSIS — W19XXXA Unspecified fall, initial encounter: Secondary | ICD-10-CM | POA: Diagnosis not present

## 2023-02-03 DIAGNOSIS — Z7901 Long term (current) use of anticoagulants: Secondary | ICD-10-CM | POA: Diagnosis not present

## 2023-02-03 DIAGNOSIS — N1832 Chronic kidney disease, stage 3b: Secondary | ICD-10-CM | POA: Diagnosis not present

## 2023-02-06 DIAGNOSIS — E785 Hyperlipidemia, unspecified: Secondary | ICD-10-CM | POA: Diagnosis not present

## 2023-02-06 DIAGNOSIS — N1832 Chronic kidney disease, stage 3b: Secondary | ICD-10-CM | POA: Diagnosis not present

## 2023-02-06 DIAGNOSIS — I480 Paroxysmal atrial fibrillation: Secondary | ICD-10-CM | POA: Diagnosis not present

## 2023-02-06 DIAGNOSIS — S82891A Other fracture of right lower leg, initial encounter for closed fracture: Secondary | ICD-10-CM | POA: Diagnosis not present

## 2023-02-06 DIAGNOSIS — W19XXXA Unspecified fall, initial encounter: Secondary | ICD-10-CM | POA: Diagnosis not present

## 2023-02-06 DIAGNOSIS — I341 Nonrheumatic mitral (valve) prolapse: Secondary | ICD-10-CM | POA: Diagnosis not present

## 2023-02-06 DIAGNOSIS — M1380 Other specified arthritis, unspecified site: Secondary | ICD-10-CM | POA: Diagnosis not present

## 2023-02-06 DIAGNOSIS — Z7901 Long term (current) use of anticoagulants: Secondary | ICD-10-CM | POA: Diagnosis not present

## 2023-02-07 ENCOUNTER — Ambulatory Visit (HOSPITAL_BASED_OUTPATIENT_CLINIC_OR_DEPARTMENT_OTHER): Payer: Medicare Other | Admitting: Student

## 2023-02-07 ENCOUNTER — Ambulatory Visit (HOSPITAL_BASED_OUTPATIENT_CLINIC_OR_DEPARTMENT_OTHER): Payer: Medicare Other

## 2023-02-07 ENCOUNTER — Encounter (HOSPITAL_BASED_OUTPATIENT_CLINIC_OR_DEPARTMENT_OTHER): Payer: Self-pay | Admitting: Student

## 2023-02-07 DIAGNOSIS — S92101A Unspecified fracture of right talus, initial encounter for closed fracture: Secondary | ICD-10-CM

## 2023-02-07 DIAGNOSIS — S8261XD Displaced fracture of lateral malleolus of right fibula, subsequent encounter for closed fracture with routine healing: Secondary | ICD-10-CM | POA: Diagnosis not present

## 2023-02-07 NOTE — Progress Notes (Signed)
Chief Complaint: Right ankle injury     History of Present Illness:   02/07/23: Patient is here today for follow-up of a right lateral talus avulsion fracture.  Since last visit I have allowed her to wean out of the walking boot as this was causing friction to the skin and causing bruising.  Today she reports some improvement in her symptoms.  Her pain level is a 3/10 without use of pain medication.  Her ankle continues to swell with increased activity.  She has been seeing improvements with physical therapy and has been able to ambulate better with the use of a walker.   01/19/22: Patient presents today for follow-up of her right ankle.  She has been wearing the walking boot and states that recently this has began rubbing the skin on her ankle.  These areas are red and painful but denies any breakdown of the skin.  She has gotten some improvement in her swelling and bruising.  Taking Tylenol as needed for pain.  She is working with home health PT.  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   PAF (paroxysmal atrial fibrillation) (HCC) 11/2017   CHA2DS2 VASc score > 3 (Age >47 and female sex), --> Eliquis & Metoprolol    Past Surgical History:  Procedure Laterality Date   CARDIOVERSION N/A 10/28/2021   Procedure: CARDIOVERSION;  Surgeon: Meriam Sprague, MD;  Location: [facility] ENDOSCOPY;  Service: Cardiovascular;  Laterality: N/A;   CARDIOVERSION N/A 11/06/2021   Procedure: CARDIOVERSION;  Surgeon: Christell Constant, MD;  Location: [facility] ENDOSCOPY;  Service: Cardiovascular;  Laterality: N/A;   CATARACT EXTRACTION, BILATERAL     CHOLECYSTECTOMY N/A 11/24/2021   Procedure: LAPAROSCOPIC CHOLECYSTECTOMY;  Surgeon:  Darnell Level, MD;  Location: [facility] ORS;  Service: General;  Laterality: N/A;   ERCP N/A 11/23/2021   Procedure: ENDOSCOPIC RETROGRADE CHOLANGIOPANCREATOGRAPHY (ERCP);  Surgeon: Kerin Salen, MD;  Location: [facility] ENDOSCOPY;  Service: Gastroenterology;  Laterality: N/A;   REMOVAL OF STONES  11/23/2021   Procedure: REMOVAL OF STONES;  Surgeon: Kerin Salen, MD;  Location: [facility] ENDOSCOPY;  Service: Gastroenterology;;   Dennison Mascot  11/23/2021   Procedure: Dennison Mascot;  Surgeon: Kerin Salen, MD;  Location: [facility] ENDOSCOPY;  Service: Gastroenterology;;   TEE WITHOUT CARDIOVERSION N/A 10/28/2021   Procedure: TRANSESOPHAGEAL ECHOCARDIOGRAM (TEE) - SYNCHRONIZED DIRECT CURRENT CARDIOVERSION (DCCV);  Surgeon: Meriam Sprague, MD;  Location: [facility] ENDOSCOPY;  Service: CV;; LVEF 65-70%. No RWMA . Mod LA dilation w/o thrombus. Normal RV, Mod RA dilation. 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 Bileaflet Prolapse w/ Large Post & small Ant Jet.= 3+/Mod MR - no PVV reversal. = DCCV   TRANSTHORACIC ECHOCARDIOGRAM  10/25/2021   Notably he was very elderly woman.  No acute distress.  Just seems worn out.   TRANSTHORACIC ECHOCARDIOGRAM  12/12/2019    EF 60 to 65%.  No R WMA.  Unable to assess diastolic pressures.  Normal RV pressures.  Mild biatrial enlargement.  Moderate aortic calcification - no AS.  Severe BI-Leaflet MVP with Mod-Severe MR   Social  History   Socioeconomic History   Marital status: Married    Spouse name: Not on file   Number of children: Not on file   Years of education: Not on file   Highest education level: Not on file  Occupational History   Occupation: Retired  Tobacco Use   Smoking status: Never   Smokeless tobacco: Never  Vaping Use   Vaping status: Never Used  Substance and Sexual Activity   Alcohol use: No   Drug use: No   Sexual activity: Not on file  Other Topics Concern   Not on file  Social History Narrative   Recently widowed November 2019--she had been the  primary caregiver for her husband who had progressively worsening dementia.   Now lives alone and is planning to move into a retirement community potentially.  She handled his death well, and felt somewhat liberated having the stress of being caregiver removed.   Social Drivers of Corporate investment banker Strain: Not on file  Food Insecurity: No Food Insecurity (01/06/2023)   Hunger Vital Sign    Worried About Running Out of Food in the Last Year: Never true    Ran Out of Food in the Last Year: Never true  Transportation Needs: No Transportation Needs (01/06/2023)   PRAPARE - Administrator, Civil Service (Medical): No    Lack of Transportation (Non-Medical): No  Physical Activity: Not on file  Stress: Not on file  Social Connections: Moderately Integrated (01/05/2023)   Social Connection and Isolation Panel [NHANES]    Frequency of Communication with Friends and Family: More than three times a week    Frequency of Social Gatherings with Friends and Family: More than three times a week    Attends Religious Services: More than 4 times per year    Active Member of Clubs or Organizations: No    Attends Banker Meetings: 1 to 4 times per year    Marital Status: Widowed   Family History  Problem Relation Age of Onset   Cervical cancer Mother    CAD Father    Lung cancer Sister    Stroke Neg Hx    Diabetes Neg Hx    Allergies  Allergen Reactions   Ciprofloxacin Diarrhea   Sulfa Antibiotics     unknown   Amoxicillin Rash   Lidocaine Palpitations    unknown   Current Outpatient Medications  Medication Sig Dispense Refill   acetaminophen (TYLENOL) 325 MG tablet Take 2 tablets (650 mg total) by mouth every 4 (four) hours as needed.     amiodarone (PACERONE) 200 MG tablet TAKE 1 TABLET BY MOUTH TWICE DAILY OR AS DIRECTED 90 tablet 3   apixaban (ELIQUIS) 2.5 MG TABS tablet TAKE 1 TABLET TWICE A DAY 180 tablet 1   atorvastatin (LIPITOR) 40 MG tablet Take 40 mg  by mouth at bedtime.     calcium carbonate (OS-CAL - DOSED IN MG OF ELEMENTAL CALCIUM) 1250 MG tablet Take 1 tablet by mouth daily with breakfast.     fluorouracil (EFUDEX) 5 % cream Apply topically 2 (two) times daily.     furosemide (LASIX) 20 MG tablet TAKE 1 TABLET BY MOUTH EVERY DAY AS NEEDED (Patient not taking: Reported on 01/04/2023) 90 tablet 3   metoprolol succinate (TOPROL-XL) 25 MG 24 hr tablet TAKE 1/2 TABLET(12.5 MG) BY MOUTH AT BEDTIME 45 tablet 0   Multiple Vitamins-Minerals (MULTIVITAMIN PO) Take 1 tablet by mouth daily.     oxyCODONE (  OXY IR/ROXICODONE) 5 MG immediate release tablet Take 0.5 tablets (2.5 mg total) by mouth every 6 (six) hours as needed for severe pain. (Patient not taking: Reported on 01/04/2023) 15 tablet 0   pantoprazole (PROTONIX) 40 MG tablet Take 40 mg by mouth daily. (Patient not taking: Reported on 01/04/2023)     Vitamin D, Ergocalciferol, 2000 units CAPS Take 2,000 Units by mouth daily.     No current facility-administered medications for this visit.   No results found.  Review of Systems:   A ROS was performed including pertinent positives and negatives as documented in the HPI.  Physical Exam :   Constitutional: NAD and appears stated age Neurological: Alert and oriented Psych: Appropriate affect and cooperative There were no vitals taken for this visit.   Comprehensive Musculoskeletal Exam:    Diffuse ecchymosis as well as edema remains throughout the right lower leg and ankle.  Tenderness to palpation over the ATFL and lateral talus.  Active ankle range of motion to 20 degrees dorsiflexion plantarflexion.  Dorsalis pedis pulse 2+.  Imaging:   Xray (right ankle 3 views): Lateral talar process avulsion with increased callus formation   I personally reviewed and interpreted the radiographs.  Assessment:   89 y.o. female with an avulsion fracture of the lateral talar process of the right ankle.  She has been able to transition out of the  walking boot and seems to be tolerating this very well.  X-rays today do show evidence of healing and callus formation.  I do believe she would continue to benefit from physical therapy for ankle range of motion and strengthening as well as general strengthening with ambulation and weightbearing.  She was using a cane for assistance before the injury and would like to get back to this as well as being able to walk outside.  Will have her continue PT and follow-up in another 4 weeks.  Plan :    - Return to clinic in 4 weeks for reassessment     I personally saw and evaluated the patient, and participated in the management and treatment plan.  Hazle Nordmann, PA-C Orthopedics

## 2023-02-09 DIAGNOSIS — I480 Paroxysmal atrial fibrillation: Secondary | ICD-10-CM | POA: Diagnosis not present

## 2023-02-09 DIAGNOSIS — N1832 Chronic kidney disease, stage 3b: Secondary | ICD-10-CM | POA: Diagnosis not present

## 2023-02-09 DIAGNOSIS — I341 Nonrheumatic mitral (valve) prolapse: Secondary | ICD-10-CM | POA: Diagnosis not present

## 2023-02-09 DIAGNOSIS — Z7901 Long term (current) use of anticoagulants: Secondary | ICD-10-CM | POA: Diagnosis not present

## 2023-02-09 DIAGNOSIS — S82891A Other fracture of right lower leg, initial encounter for closed fracture: Secondary | ICD-10-CM | POA: Diagnosis not present

## 2023-02-09 DIAGNOSIS — W19XXXA Unspecified fall, initial encounter: Secondary | ICD-10-CM | POA: Diagnosis not present

## 2023-02-14 ENCOUNTER — Other Ambulatory Visit: Payer: Self-pay | Admitting: Cardiology

## 2023-02-15 DIAGNOSIS — W19XXXA Unspecified fall, initial encounter: Secondary | ICD-10-CM | POA: Diagnosis not present

## 2023-02-15 DIAGNOSIS — Z7901 Long term (current) use of anticoagulants: Secondary | ICD-10-CM | POA: Diagnosis not present

## 2023-02-15 DIAGNOSIS — N1832 Chronic kidney disease, stage 3b: Secondary | ICD-10-CM | POA: Diagnosis not present

## 2023-02-15 DIAGNOSIS — I341 Nonrheumatic mitral (valve) prolapse: Secondary | ICD-10-CM | POA: Diagnosis not present

## 2023-02-15 DIAGNOSIS — S82891A Other fracture of right lower leg, initial encounter for closed fracture: Secondary | ICD-10-CM | POA: Diagnosis not present

## 2023-02-15 DIAGNOSIS — I480 Paroxysmal atrial fibrillation: Secondary | ICD-10-CM | POA: Diagnosis not present

## 2023-02-17 DIAGNOSIS — Z7901 Long term (current) use of anticoagulants: Secondary | ICD-10-CM | POA: Diagnosis not present

## 2023-02-17 DIAGNOSIS — W19XXXA Unspecified fall, initial encounter: Secondary | ICD-10-CM | POA: Diagnosis not present

## 2023-02-17 DIAGNOSIS — N1832 Chronic kidney disease, stage 3b: Secondary | ICD-10-CM | POA: Diagnosis not present

## 2023-02-17 DIAGNOSIS — I480 Paroxysmal atrial fibrillation: Secondary | ICD-10-CM | POA: Diagnosis not present

## 2023-02-17 DIAGNOSIS — S82891A Other fracture of right lower leg, initial encounter for closed fracture: Secondary | ICD-10-CM | POA: Diagnosis not present

## 2023-02-17 DIAGNOSIS — I341 Nonrheumatic mitral (valve) prolapse: Secondary | ICD-10-CM | POA: Diagnosis not present

## 2023-02-22 DIAGNOSIS — N1832 Chronic kidney disease, stage 3b: Secondary | ICD-10-CM | POA: Diagnosis not present

## 2023-02-22 DIAGNOSIS — W19XXXA Unspecified fall, initial encounter: Secondary | ICD-10-CM | POA: Diagnosis not present

## 2023-02-22 DIAGNOSIS — I341 Nonrheumatic mitral (valve) prolapse: Secondary | ICD-10-CM | POA: Diagnosis not present

## 2023-02-22 DIAGNOSIS — Z7901 Long term (current) use of anticoagulants: Secondary | ICD-10-CM | POA: Diagnosis not present

## 2023-02-22 DIAGNOSIS — I480 Paroxysmal atrial fibrillation: Secondary | ICD-10-CM | POA: Diagnosis not present

## 2023-02-22 DIAGNOSIS — S82891A Other fracture of right lower leg, initial encounter for closed fracture: Secondary | ICD-10-CM | POA: Diagnosis not present

## 2023-02-24 DIAGNOSIS — S82891A Other fracture of right lower leg, initial encounter for closed fracture: Secondary | ICD-10-CM | POA: Diagnosis not present

## 2023-02-24 DIAGNOSIS — Z7901 Long term (current) use of anticoagulants: Secondary | ICD-10-CM | POA: Diagnosis not present

## 2023-02-24 DIAGNOSIS — I341 Nonrheumatic mitral (valve) prolapse: Secondary | ICD-10-CM | POA: Diagnosis not present

## 2023-02-24 DIAGNOSIS — N1832 Chronic kidney disease, stage 3b: Secondary | ICD-10-CM | POA: Diagnosis not present

## 2023-02-24 DIAGNOSIS — I480 Paroxysmal atrial fibrillation: Secondary | ICD-10-CM | POA: Diagnosis not present

## 2023-02-24 DIAGNOSIS — W19XXXA Unspecified fall, initial encounter: Secondary | ICD-10-CM | POA: Diagnosis not present

## 2023-03-03 DIAGNOSIS — I480 Paroxysmal atrial fibrillation: Secondary | ICD-10-CM | POA: Diagnosis not present

## 2023-03-03 DIAGNOSIS — N1832 Chronic kidney disease, stage 3b: Secondary | ICD-10-CM | POA: Diagnosis not present

## 2023-03-03 DIAGNOSIS — S82891A Other fracture of right lower leg, initial encounter for closed fracture: Secondary | ICD-10-CM | POA: Diagnosis not present

## 2023-03-03 DIAGNOSIS — W19XXXA Unspecified fall, initial encounter: Secondary | ICD-10-CM | POA: Diagnosis not present

## 2023-03-03 DIAGNOSIS — I341 Nonrheumatic mitral (valve) prolapse: Secondary | ICD-10-CM | POA: Diagnosis not present

## 2023-03-03 DIAGNOSIS — Z7901 Long term (current) use of anticoagulants: Secondary | ICD-10-CM | POA: Diagnosis not present

## 2023-03-04 ENCOUNTER — Other Ambulatory Visit: Payer: Self-pay | Admitting: Cardiology

## 2023-03-04 DIAGNOSIS — K5792 Diverticulitis of intestine, part unspecified, without perforation or abscess without bleeding: Secondary | ICD-10-CM | POA: Diagnosis not present

## 2023-03-08 ENCOUNTER — Ambulatory Visit (HOSPITAL_BASED_OUTPATIENT_CLINIC_OR_DEPARTMENT_OTHER): Payer: Medicare Other | Admitting: Student

## 2023-03-08 ENCOUNTER — Telehealth (HOSPITAL_BASED_OUTPATIENT_CLINIC_OR_DEPARTMENT_OTHER): Payer: Self-pay | Admitting: Student

## 2023-03-08 ENCOUNTER — Ambulatory Visit (HOSPITAL_BASED_OUTPATIENT_CLINIC_OR_DEPARTMENT_OTHER)

## 2023-03-08 DIAGNOSIS — I341 Nonrheumatic mitral (valve) prolapse: Secondary | ICD-10-CM | POA: Diagnosis not present

## 2023-03-08 DIAGNOSIS — I509 Heart failure, unspecified: Secondary | ICD-10-CM | POA: Diagnosis not present

## 2023-03-08 DIAGNOSIS — S92101A Unspecified fracture of right talus, initial encounter for closed fracture: Secondary | ICD-10-CM

## 2023-03-08 DIAGNOSIS — Z7901 Long term (current) use of anticoagulants: Secondary | ICD-10-CM | POA: Diagnosis not present

## 2023-03-08 DIAGNOSIS — S92101D Unspecified fracture of right talus, subsequent encounter for fracture with routine healing: Secondary | ICD-10-CM | POA: Diagnosis not present

## 2023-03-08 DIAGNOSIS — M1380 Other specified arthritis, unspecified site: Secondary | ICD-10-CM | POA: Diagnosis not present

## 2023-03-08 DIAGNOSIS — M7731 Calcaneal spur, right foot: Secondary | ICD-10-CM | POA: Diagnosis not present

## 2023-03-08 DIAGNOSIS — Z9049 Acquired absence of other specified parts of digestive tract: Secondary | ICD-10-CM | POA: Diagnosis not present

## 2023-03-08 DIAGNOSIS — Z9181 History of falling: Secondary | ICD-10-CM | POA: Diagnosis not present

## 2023-03-08 DIAGNOSIS — S82891D Other fracture of right lower leg, subsequent encounter for closed fracture with routine healing: Secondary | ICD-10-CM | POA: Diagnosis not present

## 2023-03-08 DIAGNOSIS — N1832 Chronic kidney disease, stage 3b: Secondary | ICD-10-CM | POA: Diagnosis not present

## 2023-03-08 DIAGNOSIS — I480 Paroxysmal atrial fibrillation: Secondary | ICD-10-CM | POA: Diagnosis not present

## 2023-03-08 DIAGNOSIS — E785 Hyperlipidemia, unspecified: Secondary | ICD-10-CM | POA: Diagnosis not present

## 2023-03-08 NOTE — Progress Notes (Signed)
 Chief Complaint: Right ankle injury     History of Present Illness:   03/08/23: Lisa Lang is here today following up on a avulsion fracture of the right talus.  She has been out of the walking boot back into normal shoes and is tolerating this well.  States that she is in no pain without use pain medication.  Has been able to discontinue use of the cane while at home but still uses it when leaving the house.  She has been working with home health physical therapy which has been beneficial.  Denies any recent falls.  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   PAF (paroxysmal atrial fibrillation) (HCC) 11/2017   CHA2DS2 VASc score > 3 (Age >49 and female sex), --> Eliquis & Metoprolol    Past Surgical History:  Procedure Laterality Date   CARDIOVERSION N/A 10/28/2021   Procedure: CARDIOVERSION;  Surgeon: Meriam Sprague, MD;  Location: [facility] ENDOSCOPY;  Service: Cardiovascular;  Laterality: N/A;   CARDIOVERSION N/A 11/06/2021   Procedure: CARDIOVERSION;  Surgeon: Christell Constant, MD;  Location: [facility] ENDOSCOPY;  Service: Cardiovascular;  Laterality: N/A;   CATARACT EXTRACTION, BILATERAL     CHOLECYSTECTOMY N/A 11/24/2021   Procedure: LAPAROSCOPIC CHOLECYSTECTOMY;  Surgeon: Darnell Level, MD;  Location: [facility] ORS;  Service: General;  Laterality: N/A;   ERCP N/A 11/23/2021   Procedure: ENDOSCOPIC RETROGRADE CHOLANGIOPANCREATOGRAPHY (ERCP);  Surgeon: Kerin Salen, MD;  Location: [facility] ENDOSCOPY;  Service: Gastroenterology;  Laterality: N/A;   REMOVAL OF STONES  11/23/2021   Procedure: REMOVAL OF STONES;  Surgeon: Kerin Salen, MD;  Location: [facility] ENDOSCOPY;  Service: Gastroenterology;;   Dennison Mascot  11/23/2021   Procedure:  Dennison Mascot;  Surgeon: Kerin Salen, MD;  Location: [facility] ENDOSCOPY;  Service: Gastroenterology;;   TEE WITHOUT CARDIOVERSION N/A 10/28/2021   Procedure: TRANSESOPHAGEAL ECHOCARDIOGRAM (TEE) - SYNCHRONIZED DIRECT CURRENT CARDIOVERSION (DCCV);  Surgeon: Meriam Sprague, MD;  Location: [facility] ENDOSCOPY;  Service: CV;; LVEF 65-70%. No RWMA . Mod LA dilation w/o thrombus. Normal RV, Mod RA dilation. 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 Bileaflet Prolapse w/ Large Post & small Ant Jet.= 3+/Mod MR - no PVV reversal. = DCCV   TRANSTHORACIC ECHOCARDIOGRAM  10/25/2021   Notably he was very elderly woman.  No acute distress.  Just seems worn out.   TRANSTHORACIC ECHOCARDIOGRAM  12/12/2019    EF 60 to 65%.  No R WMA.  Unable to assess diastolic pressures.  Normal RV pressures.  Mild biatrial enlargement.  Moderate aortic calcification - no AS.  Severe BI-Leaflet MVP with Mod-Severe MR   Social History   Socioeconomic History   Marital status: Married    Spouse name: Not on file   Number of children: Not on file   Years of education: Not on file   Highest education level: Not on file  Occupational History   Occupation: Retired  Tobacco Use   Smoking status: Never   Smokeless tobacco: Never  Vaping Use   Vaping status: Never Used  Substance and Sexual Activity   Alcohol use:  No   Drug use: No   Sexual activity: Not on file  Other Topics Concern   Not on file  Social History Narrative   Recently widowed November 2019--she had been the primary caregiver for her husband who had progressively worsening dementia.   Now lives alone and is planning to move into a retirement community potentially.  She handled his death well, and felt somewhat liberated having the stress of being caregiver removed.   Social Drivers of Corporate investment banker Strain: Not on file  Food Insecurity: No Food Insecurity (01/06/2023)   Hunger Vital Sign    Worried About Running Out of Food in the Last  Year: Never true    Ran Out of Food in the Last Year: Never true  Transportation Needs: No Transportation Needs (01/06/2023)   PRAPARE - Administrator, Civil Service (Medical): No    Lack of Transportation (Non-Medical): No  Physical Activity: Not on file  Stress: Not on file  Social Connections: Moderately Integrated (01/05/2023)   Social Connection and Isolation Panel [NHANES]    Frequency of Communication with Friends and Family: More than three times a week    Frequency of Social Gatherings with Friends and Family: More than three times a week    Attends Religious Services: More than 4 times per year    Active Member of Clubs or Organizations: No    Attends Banker Meetings: 1 to 4 times per year    Marital Status: Widowed   Family History  Problem Relation Age of Onset   Cervical cancer Mother    CAD Father    Lung cancer Sister    Stroke Neg Hx    Diabetes Neg Hx    Allergies  Allergen Reactions   Ciprofloxacin Diarrhea   Sulfa Antibiotics     unknown   Amoxicillin Rash   Lidocaine Palpitations    unknown   Current Outpatient Medications  Medication Sig Dispense Refill   acetaminophen (TYLENOL) 325 MG tablet Take 2 tablets (650 mg total) by mouth every 4 (four) hours as needed.     amiodarone (PACERONE) 200 MG tablet TAKE 1 TABLET BY MOUTH TWICE DAILY OR AS DIRECTED 90 tablet 0   apixaban (ELIQUIS) 2.5 MG TABS tablet TAKE 1 TABLET TWICE A DAY 180 tablet 1   atorvastatin (LIPITOR) 40 MG tablet Take 40 mg by mouth at bedtime.     calcium carbonate (OS-CAL - DOSED IN MG OF ELEMENTAL CALCIUM) 1250 MG tablet Take 1 tablet by mouth daily with breakfast.     fluorouracil (EFUDEX) 5 % cream Apply topically 2 (two) times daily.     furosemide (LASIX) 20 MG tablet TAKE 1 TABLET BY MOUTH EVERY DAY AS NEEDED (Patient not taking: Reported on 01/04/2023) 90 tablet 3   metoprolol succinate (TOPROL-XL) 25 MG 24 hr tablet TAKE 1/2 TABLET(12.5 MG) BY MOUTH AT  BEDTIME 45 tablet 0   Multiple Vitamins-Minerals (MULTIVITAMIN PO) Take 1 tablet by mouth daily.     oxyCODONE (OXY IR/ROXICODONE) 5 MG immediate release tablet Take 0.5 tablets (2.5 mg total) by mouth every 6 (six) hours as needed for severe pain. (Patient not taking: Reported on 01/04/2023) 15 tablet 0   pantoprazole (PROTONIX) 40 MG tablet Take 40 mg by mouth daily. (Patient not taking: Reported on 01/04/2023)     Vitamin D, Ergocalciferol, 2000 units CAPS Take 2,000 Units by mouth daily.     No current facility-administered medications for this visit.  No results found.  Review of Systems:   A ROS was performed including pertinent positives and negatives as documented in the HPI.  Physical Exam :   Constitutional: NAD and appears stated age Neurological: Alert and oriented Psych: Appropriate affect and cooperative There were no vitals taken for this visit.   Comprehensive Musculoskeletal Exam:    Right ankle exam demonstrates some mild tenderness over the ATFL and lateral talus.  Full active ROM with dorsiflexion plantarflexion compared to contralateral side.  Mild edema throughout the ankle and right lower extremity.  Dorsalis pedis 2+.  Imaging:   Xray (right ankle 3 views): Avulsion fracture of the lateral talar process with increased sclerosis and decreased fracture line visualization.   I personally reviewed and interpreted the radiographs.  Assessment:   89 y.o. female with a fracture of the right talar process overall doing extremely well.  X-rays taken today do show that this is essentially healed from a radiographic standpoint.  She is otherwise asymptomatic.  At this point I do not believe follow-up or repeat x-rays will be necessary unless symptoms return so we can plan to follow-up as needed.  I would like for her to continue with home health physical therapy in order to regain full strength of the ankle as well as work on balance and fall prevention.  Plan :     -Return to clinic as needed     I personally saw and evaluated the patient, and participated in the management and treatment plan.  Hazle Nordmann, PA-C Orthopedics

## 2023-03-08 NOTE — Telephone Encounter (Signed)
 Lisa Lang from [facility] (p) [phone] (F) [phone] want to know if patient can walking without any devise and strengthen of ankle inversions eversions

## 2023-03-09 DIAGNOSIS — S82891D Other fracture of right lower leg, subsequent encounter for closed fracture with routine healing: Secondary | ICD-10-CM | POA: Diagnosis not present

## 2023-03-09 DIAGNOSIS — N1832 Chronic kidney disease, stage 3b: Secondary | ICD-10-CM | POA: Diagnosis not present

## 2023-03-09 DIAGNOSIS — I480 Paroxysmal atrial fibrillation: Secondary | ICD-10-CM | POA: Diagnosis not present

## 2023-03-09 DIAGNOSIS — E785 Hyperlipidemia, unspecified: Secondary | ICD-10-CM | POA: Diagnosis not present

## 2023-03-09 DIAGNOSIS — I341 Nonrheumatic mitral (valve) prolapse: Secondary | ICD-10-CM | POA: Diagnosis not present

## 2023-03-09 DIAGNOSIS — I509 Heart failure, unspecified: Secondary | ICD-10-CM | POA: Diagnosis not present

## 2023-03-11 DIAGNOSIS — I480 Paroxysmal atrial fibrillation: Secondary | ICD-10-CM | POA: Diagnosis not present

## 2023-03-11 DIAGNOSIS — N1832 Chronic kidney disease, stage 3b: Secondary | ICD-10-CM | POA: Diagnosis not present

## 2023-03-11 DIAGNOSIS — S82891D Other fracture of right lower leg, subsequent encounter for closed fracture with routine healing: Secondary | ICD-10-CM | POA: Diagnosis not present

## 2023-03-11 DIAGNOSIS — I341 Nonrheumatic mitral (valve) prolapse: Secondary | ICD-10-CM | POA: Diagnosis not present

## 2023-03-11 DIAGNOSIS — I509 Heart failure, unspecified: Secondary | ICD-10-CM | POA: Diagnosis not present

## 2023-03-11 DIAGNOSIS — E785 Hyperlipidemia, unspecified: Secondary | ICD-10-CM | POA: Diagnosis not present

## 2023-03-14 DIAGNOSIS — D492 Neoplasm of unspecified behavior of bone, soft tissue, and skin: Secondary | ICD-10-CM | POA: Diagnosis not present

## 2023-03-15 DIAGNOSIS — I480 Paroxysmal atrial fibrillation: Secondary | ICD-10-CM | POA: Diagnosis not present

## 2023-03-15 DIAGNOSIS — I509 Heart failure, unspecified: Secondary | ICD-10-CM | POA: Diagnosis not present

## 2023-03-15 DIAGNOSIS — N1832 Chronic kidney disease, stage 3b: Secondary | ICD-10-CM | POA: Diagnosis not present

## 2023-03-15 DIAGNOSIS — I341 Nonrheumatic mitral (valve) prolapse: Secondary | ICD-10-CM | POA: Diagnosis not present

## 2023-03-15 DIAGNOSIS — S82891D Other fracture of right lower leg, subsequent encounter for closed fracture with routine healing: Secondary | ICD-10-CM | POA: Diagnosis not present

## 2023-03-15 DIAGNOSIS — E785 Hyperlipidemia, unspecified: Secondary | ICD-10-CM | POA: Diagnosis not present

## 2023-03-17 DIAGNOSIS — L570 Actinic keratosis: Secondary | ICD-10-CM | POA: Diagnosis not present

## 2023-03-17 DIAGNOSIS — D485 Neoplasm of uncertain behavior of skin: Secondary | ICD-10-CM | POA: Diagnosis not present

## 2023-03-18 DIAGNOSIS — S82891D Other fracture of right lower leg, subsequent encounter for closed fracture with routine healing: Secondary | ICD-10-CM | POA: Diagnosis not present

## 2023-03-18 DIAGNOSIS — E785 Hyperlipidemia, unspecified: Secondary | ICD-10-CM | POA: Diagnosis not present

## 2023-03-18 DIAGNOSIS — I480 Paroxysmal atrial fibrillation: Secondary | ICD-10-CM | POA: Diagnosis not present

## 2023-03-18 DIAGNOSIS — N1832 Chronic kidney disease, stage 3b: Secondary | ICD-10-CM | POA: Diagnosis not present

## 2023-03-18 DIAGNOSIS — I509 Heart failure, unspecified: Secondary | ICD-10-CM | POA: Diagnosis not present

## 2023-03-18 DIAGNOSIS — I341 Nonrheumatic mitral (valve) prolapse: Secondary | ICD-10-CM | POA: Diagnosis not present

## 2023-03-21 DIAGNOSIS — I341 Nonrheumatic mitral (valve) prolapse: Secondary | ICD-10-CM | POA: Diagnosis not present

## 2023-03-21 DIAGNOSIS — I480 Paroxysmal atrial fibrillation: Secondary | ICD-10-CM | POA: Diagnosis not present

## 2023-03-21 DIAGNOSIS — N1832 Chronic kidney disease, stage 3b: Secondary | ICD-10-CM | POA: Diagnosis not present

## 2023-03-21 DIAGNOSIS — S82891D Other fracture of right lower leg, subsequent encounter for closed fracture with routine healing: Secondary | ICD-10-CM | POA: Diagnosis not present

## 2023-03-21 DIAGNOSIS — I509 Heart failure, unspecified: Secondary | ICD-10-CM | POA: Diagnosis not present

## 2023-03-21 DIAGNOSIS — E785 Hyperlipidemia, unspecified: Secondary | ICD-10-CM | POA: Diagnosis not present

## 2023-03-22 DIAGNOSIS — H524 Presbyopia: Secondary | ICD-10-CM | POA: Diagnosis not present

## 2023-03-22 DIAGNOSIS — H26493 Other secondary cataract, bilateral: Secondary | ICD-10-CM | POA: Diagnosis not present

## 2023-03-22 DIAGNOSIS — H04123 Dry eye syndrome of bilateral lacrimal glands: Secondary | ICD-10-CM | POA: Diagnosis not present

## 2023-03-29 DIAGNOSIS — N1832 Chronic kidney disease, stage 3b: Secondary | ICD-10-CM | POA: Diagnosis not present

## 2023-03-29 DIAGNOSIS — E785 Hyperlipidemia, unspecified: Secondary | ICD-10-CM | POA: Diagnosis not present

## 2023-03-29 DIAGNOSIS — I509 Heart failure, unspecified: Secondary | ICD-10-CM | POA: Diagnosis not present

## 2023-03-29 DIAGNOSIS — I480 Paroxysmal atrial fibrillation: Secondary | ICD-10-CM | POA: Diagnosis not present

## 2023-03-29 DIAGNOSIS — S82891D Other fracture of right lower leg, subsequent encounter for closed fracture with routine healing: Secondary | ICD-10-CM | POA: Diagnosis not present

## 2023-03-29 DIAGNOSIS — I341 Nonrheumatic mitral (valve) prolapse: Secondary | ICD-10-CM | POA: Diagnosis not present

## 2023-04-06 DIAGNOSIS — S81811A Laceration without foreign body, right lower leg, initial encounter: Secondary | ICD-10-CM | POA: Diagnosis not present

## 2023-04-19 DIAGNOSIS — L929 Granulomatous disorder of the skin and subcutaneous tissue, unspecified: Secondary | ICD-10-CM | POA: Diagnosis not present

## 2023-04-19 DIAGNOSIS — C4442 Squamous cell carcinoma of skin of scalp and neck: Secondary | ICD-10-CM | POA: Diagnosis not present

## 2023-04-19 DIAGNOSIS — L98499 Non-pressure chronic ulcer of skin of other sites with unspecified severity: Secondary | ICD-10-CM | POA: Diagnosis not present

## 2023-04-19 DIAGNOSIS — D485 Neoplasm of uncertain behavior of skin: Secondary | ICD-10-CM | POA: Diagnosis not present

## 2023-04-19 DIAGNOSIS — S81811D Laceration without foreign body, right lower leg, subsequent encounter: Secondary | ICD-10-CM | POA: Diagnosis not present

## 2023-04-19 DIAGNOSIS — Z09 Encounter for follow-up examination after completed treatment for conditions other than malignant neoplasm: Secondary | ICD-10-CM | POA: Diagnosis not present

## 2023-05-06 DIAGNOSIS — S81819A Laceration without foreign body, unspecified lower leg, initial encounter: Secondary | ICD-10-CM | POA: Diagnosis not present

## 2023-05-12 ENCOUNTER — Other Ambulatory Visit: Payer: Self-pay | Admitting: Cardiology

## 2023-05-12 DIAGNOSIS — L988 Other specified disorders of the skin and subcutaneous tissue: Secondary | ICD-10-CM | POA: Diagnosis not present

## 2023-05-18 ENCOUNTER — Ambulatory Visit: Payer: Medicare Other | Attending: Cardiology | Admitting: Cardiology

## 2023-05-18 ENCOUNTER — Encounter: Payer: Self-pay | Admitting: Cardiology

## 2023-05-18 VITALS — BP 138/52 | HR 60 | Ht 62.0 in | Wt 108.0 lb

## 2023-05-18 DIAGNOSIS — I5032 Chronic diastolic (congestive) heart failure: Secondary | ICD-10-CM | POA: Insufficient documentation

## 2023-05-18 DIAGNOSIS — Z79899 Other long term (current) drug therapy: Secondary | ICD-10-CM | POA: Diagnosis not present

## 2023-05-18 DIAGNOSIS — I480 Paroxysmal atrial fibrillation: Secondary | ICD-10-CM | POA: Insufficient documentation

## 2023-05-18 DIAGNOSIS — D6869 Other thrombophilia: Secondary | ICD-10-CM | POA: Diagnosis not present

## 2023-05-18 DIAGNOSIS — I38 Endocarditis, valve unspecified: Secondary | ICD-10-CM | POA: Diagnosis not present

## 2023-05-18 DIAGNOSIS — R5383 Other fatigue: Secondary | ICD-10-CM | POA: Insufficient documentation

## 2023-05-18 DIAGNOSIS — I340 Nonrheumatic mitral (valve) insufficiency: Secondary | ICD-10-CM | POA: Diagnosis not present

## 2023-05-18 MED ORDER — AMIODARONE HCL 100 MG PO TABS: 100.0000 mg | ORAL_TABLET | Freq: Every day | ORAL | 3 refills | Status: DC

## 2023-05-18 NOTE — Progress Notes (Signed)
 Cardiology Office Note:  .   Date:  05/21/2023  ID:  Lisa Lang, DOB 1927-01-11, MRN [id number] PCP: Lysle Saunas, MD (Inactive)  [facility] Providers Cardiologist:  Randene Bustard, MD     Chief Complaint  Patient presents with   Follow-up    Feeling relatively well.   Atrial Fibrillation    CC maintaining sinus rhythm   Cardiac Valve Problem    Mitral prolapse with MR.  Asymptomatic    Patient Profile: .     Lisa Lang is a  89 y.o. female  with a PMH reviewed below who presents here for ~ annual f/u.  Past medical history significant for: PAF. 11/06/2021: Successful cardioversion. On amiodarone .  Chest xray 01/06/2022: Mild pulmonary edema and possible small pleural effusions. Mild cardiomegaly.  TSH 01/06/2022: 13.784. CMP 01/06/2022: AST 82, ALT 85.  BNP 01/06/2022: 329.4. Event monitor 01/26/2022: Predominant rhythm was sinus.  Less than 1% ventricular and supraventricular ectopy.  No triggered episodes. Moderate to severe MR/chronic diastolic heart failure due to valvular disease. Echo 10/25/2021: EF 65 to 70%.  Moderately elevated pulmonary artery systolic pressure.  Moderately dilated bilateral atria.  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  The posterior leaflet is diminutive and there is at least moderate posterior MR.  Severe prolapse of both leaflets of the mitral valve.  Moderate MAC.  Moderate TR.  Moderate calcification/sclerosis of the aortic valve without stenosis.  Mild to moderate AR. Reviewed by valve team and felt patient was not favorable for MitraClip and she does not seem interested in discussing open MVR. Hyperlipidemia.     I last saw [name] December 23, 2021-at that time she was maintaining sinus rhythm with excessive warm labs during an hospitalization for choledocholithiasis.  She was on amiodarone  load and was started to note some signs of fatigue and weight loss. => Amiodarone  dose was reduced to 200 mg daily  for 2 weeks and then was decreased to 1/2 tablet as of January 16, 2022.  We also converted from Lopressor  to Toprol  12.5 mg daily remained on Eliquis  2.5 mg twice daily.  There was concern about mitral prolapse exasperating A-fib and discussed referral to CVTS or structural heart.  Continued PRN Lasix , but use sparingly because of fear for dehydration.. -> She was seen on January 06, 2018 for in the A-fib clinic for complaints of dyspnea.  She noted exertional dyspnea but not dyspnea at rest.  Weight was down.  Seems euvolemic on exam and oxygen saturations were fine on room air.  90 palpitations.  Her weight was down from 58.4 kg to 44.1.  She may very well have been in A-fib in the preceding day but was feeling better.  => Zio patch ordered that revealed mostly sinus rhythm with some ectopy but no A-fib.  She was then seen 9 days later by Morey Ar, NP, NP again noting increased dyspnea hide send she was whenever she did not use her prescription.  She denied any palpitations.  She had been given 5 days of Lasix  and her weight was down 2 pounds.  Did not do significantly better.  Was felt to be euvolemic felt to be deconditioned.  Also suggested nutritional supplementation.  She was most recently seen in April 2020 for again by Morey Ar, NP noting significant improvement.  She was attending physical therapy 3 times a week was getting stronger.  Energy level still not back up to baseline but was better.  Appetite also improving.  Exertional dyspnea proved.  Some lower extremity edema noted with the left being more notable the right but she says that she usually elevates her right leg more than the left.  Apparently she had a recent mechanical fall.  Subjective  Discussed the use of AI scribe software for clinical note transcription with the patient, who gave verbal consent to proceed.  History of Present Illness  Lisa Lang presents here today overall feeling quite well with no  major issues.  She just notes a little bit of exercise intolerance and fatigue.  She underwent cardioversion in October 2023 and has been on amiodarone  since then, currently taking 200 mg twice daily. Since the procedure, she has not experienced any episodes of rapid or irregular heartbeats, shortness of breath, or chest pain.  No issues with coughing, wheezing, or difficulty breathing during physical exertion. No history of syncope or cerebrovascular events. She reports occasional mild swelling in her ankles, which improves with elevation. She uses a cane due to balance issues following a foot fracture and subsequent therapy. No orthopnea or paroxysmal nocturnal dyspnea, and no chest tightness or pain.  -- She is on Eliquis  2.5 mg twice daily for anticoagulation and continues her cholesterol medication. Her cholesterol levels have not been checked recently.  She has alopecia and wears a cap when going out.   Her home systolic blood pressure was noted to be 112, and she has never had hypertension. She takes Toprol  12.5 mg, which does not significantly affect her blood pressure.    Objective   Current Meds  Medication Sig   acetaminophen  (TYLENOL ) 325 MG tablet Take 2 tablets (650 mg total) by mouth every 4 (four) hours as needed.   amiodarone  (PACERONE ) 200 MG tablet TAKE 1 TABLET BY MOUTH TWICE DAILY OR AS DIRECTED   apixaban  (ELIQUIS ) 2.5 MG TABS tablet TAKE 1 TABLET TWICE A DAY   atorvastatin  (LIPITOR) 40 MG tablet Take 40 mg by mouth at bedtime.   calcium  carbonate (OS-CAL - DOSED IN MG OF ELEMENTAL CALCIUM ) 1250 MG tablet Take 1 tablet by mouth daily with breakfast.   metoprolol  succinate (TOPROL -XL) 25 MG 24 hr tablet TAKE 1/2 TABLET(12.5 MG) BY MOUTH AT BEDTIME   pantoprazole (PROTONIX) 40 MG tablet Take 40 mg by mouth daily.   Vitamin D , Ergocalciferol , 2000 units CAPS Take 2,000 Units by mouth daily.     Studies Reviewed: Aaron Aas   EKG Interpretation Date/Time:  Wednesday May 18 2023 15:45:52 EDT Ventricular Rate:  60 PR Interval:  244 QRS Duration:  170 QT Interval:  468 QTC Calculation: 468 R Axis:   185  Text Interpretation: Sinus rhythm with 1st degree A-V block Right bundle branch block with repolarization abnormality When compared with ECG of 21-Jan-2023 19:11, No significant change since last tracing Confirmed by Randene Bustard ([id number]) on 05/18/2023 3:54:10 PM    TTE 10/25/2021: EF 65 to 70%.  No RWMA.  Moderate elevated PAP estimated 58 mmHg.  Moderate LA and RA dilation.  Myxomatous MV with bileaflet prolapse.  Moderate posterior MR.  Moderate MAC.  Moderate TR.  Moderate aortic calcification with no stenosis.   TEE / DCCV 10/28/2021: LVEF 65-70%. No RWMA. Mod LAD Dilation with no LA or LAA thrombus. Normal RV.  Mod RA dilation.  Myxomatous MV with large overriding AML & diminutive PML -bileaflet prolapse with large posterior directed jet and small anterior jet.  Moderate MR (EROA posterior to 0.2 cm.).  No systolic flow reversal with  PV.  Mild-MOd TR. AoV sclerosis without stenosis. =>  Following TEE, the patient underwent successful DCCV with 150J x1. NOT candidate for MITRAL CLIP.     Risk Assessment/Calculations:    CHA2DS2-VASc Score =     This indicates a  % annual risk of stroke. The patient's score is based upon:    Patient is on DOAC       Physical Exam:   VS:  BP (!) 138/52   Pulse 60   Ht 5\' 2"  (1.575 m)   Wt 108 lb (49 kg)   SpO2 95%   BMI 19.75 kg/m    Wt Readings from Last 3 Encounters:  05/18/23 108 lb (49 kg)  01/05/23 107 lb 2.3 oz (48.6 kg)  04/21/22 94 lb (42.6 kg)    GEN: Well nourished, well developed in no acute distress; healthy-appearing.  Seems more vibrant than previously noted. HEAD/NECK: No JVD; No carotid bruits; she is wearing a bandage on her head would wrap around the jaw for alopecia. CARDIAC:  RRR, Normal S1, split S2; midsystolic click noted.  Mid-to-late HSM 3/6 noted at apex radiating to the back and  high-pitched 1/6 SEM at RUSB-neck. RESPIRATORY:  Clear to auscultation without rales, wheezing or rhonchi ; nonlabored, good air movement. ABDOMEN: Soft, non-tender, non-distended EXTREMITIES:  No edema; No deformity      ASSESSMENT AND PLAN: .    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  Trivial edema.  Seems to be less malnourished and dehydrated.  Edema improves with foot elevation.  I suspect is a component of venous insufficiency. - Advise leg elevation to reduce swelling. - Recommend wearing light to moderate weight support socks, especially during prolonged standing or travel. - Does not have Lasix  ordered at present. ->  Could always use as needed Lasix  if needed but will need to refill.      Relevant Medications   amiodarone  (PACERONE ) 100 MG tablet   Hypercoagulable state due to paroxysmal atrial fibrillation (HCC): CHA2DS2-VASc score 6 (Chronic)   On appropriate age and renal function based dose of Eliquis  2.5 mg twice daily.  No major bleeding issues.  She does have recurrent paroxysms of A-fib and therefore we will try to avoid holding apixaban , but okay to hold for procedures or surgeries 2 to 3 days.       Relevant Medications   amiodarone  (PACERONE ) 100 MG tablet   Moderate to severe mitral regurgitation (Chronic)   Mitral prolapse with MR exacerbated by A-fib.  CC has been feeling relatively well and therefore we will hold off on referral to the structural heart or CVTS.      Relevant Medications   amiodarone  (PACERONE ) 100 MG tablet   Paroxysmal atrial fibrillation (HCC) - Primary (Chronic)   Atrial fibrillation stable-currently maintaining sinus rhythm on amiodarone .  Current dose exceeds maintenance needs. Discussed side effects and need for regular thyroid  and liver monitoring. - Reduce amiodarone  to 1/2 a tablet once daily. - Provide  instructions for dose adjustment in case of breakthrough atrial fibrillation.=> Increased to 2 tabs daily for 3 days and then 1 full tab daily to complete 1 week until converted. - Ensure primary care physician checks full thyroid  panel, liver function tests, and markers of inflammation (ESR, CRP) during next visit. - Inform ophthalmologist of amiodarone  use to monitor for corneal deposits.  Relevant Medications   amiodarone  (PACERONE ) 100 MG tablet   Other Relevant Orders   EKG 12-Lead (Completed)     Other   Fatigue due to treatment (Chronic)   Need to ensure that she is actually taking half dose of amiodarone  as it seemed to change her level of energy.      On amiodarone  therapy (Chronic)   Since labs are being followed by PCP, we will try to minimize how many times to get stuck. - Ensure primary care physician checks full thyroid  panel, liver function tests, and markers of inflammation (ESR, CRP) during next visit. - Inform ophthalmologist of amiodarone  use to monitor for corneal deposits.              Follow-Up: Return in about 1 year (around 05/17/2024) for Routine follow up with me, [employer].    Signed, Arleen Lacer, MD, MS Randene Bustard, M.D., M.S. Interventional Chartered certified accountant  Pager # [phone]

## 2023-05-18 NOTE — Patient Instructions (Signed)
 Medication Instructions:  - START AMIODARONE  100 MG BY MOUTH ONCE DAILY    *If you need a refill on your cardiac medications before your next appointment, please call your pharmacy*   Lab Work: PLEASE HAVE PCP DRAWN THE FOLLOW LABS ON MONDAY AND SEND THEM TO OUR OFFICE.  - TSH - LFT - ESR -CRP   If you have labs (blood work) drawn today and your tests are completely normal, you will receive your results only by: MyChart Message (if you have MyChart) OR A paper copy in the mail If you have any lab test that is abnormal or we need to change your treatment, we will call you to review the results.   Testing/Procedures: NONE    Follow-Up: At [facility], you and your health needs are our priority.  As part of our continuing mission to provide you with exceptional heart care, we have created designated Provider Care Teams.  These Care Teams include your primary Cardiologist (physician) and Advanced Practice Providers (APPs -  Physician Assistants and Nurse Practitioners) who all work together to provide you with the care you need, when you need it.  We recommend signing up for the patient portal called "MyChart".  Sign up information is provided on this After Visit Summary.  MyChart is used to connect with patients for Virtual Visits (Telemedicine).  Patients are able to view lab/test results, encounter notes, upcoming appointments, etc.  Non-urgent messages can be sent to your provider as well.   To learn more about what you can do with MyChart, go to [email or web address].    Your next appointment:   1 year(s)  The format for your next appointment:   In Person  Provider:   Randene Bustard, MD   Other Instructions

## 2023-05-21 ENCOUNTER — Encounter: Payer: Self-pay | Admitting: Cardiology

## 2023-05-21 NOTE — Assessment & Plan Note (Signed)
 Seems to be euvolemic on exam.  Trivial edema.  Seems to be less malnourished and dehydrated.  Edema improves with foot elevation.  I suspect is a component of venous insufficiency. - Advise leg elevation to reduce swelling. - Recommend wearing light to moderate weight support socks, especially during prolonged standing or travel. - Does not have Lasix  ordered at present. ->  Could always use as needed Lasix  if needed but will need to refill.

## 2023-05-21 NOTE — Assessment & Plan Note (Signed)
 Mitral prolapse with MR exacerbated by A-fib.  CC has been feeling relatively well and therefore we will hold off on referral to the structural heart or CVTS.

## 2023-05-21 NOTE — Assessment & Plan Note (Signed)
 Atrial fibrillation stable-currently maintaining sinus rhythm on amiodarone .  Current dose exceeds maintenance needs. Discussed side effects and need for regular thyroid  and liver monitoring. - Reduce amiodarone  to 1/2 a tablet once daily. - Provide instructions for dose adjustment in case of breakthrough atrial fibrillation.=> Increased to 2 tabs daily for 3 days and then 1 full tab daily to complete 1 week until converted. - Ensure primary care physician checks full thyroid  panel, liver function tests, and markers of inflammation (ESR, CRP) during next visit. - Inform ophthalmologist of amiodarone  use to monitor for corneal deposits.

## 2023-05-21 NOTE — Assessment & Plan Note (Signed)
 Since labs are being followed by PCP, we will try to minimize how many times to get stuck. - Ensure primary care physician checks full thyroid  panel, liver function tests, and markers of inflammation (ESR, CRP) during next visit. - Inform ophthalmologist of amiodarone  use to monitor for corneal deposits.

## 2023-05-21 NOTE — Assessment & Plan Note (Signed)
 Need to ensure that she is actually taking half dose of amiodarone  as it seemed to change her level of energy.

## 2023-05-21 NOTE — Progress Notes (Incomplete)
 Cardiology Office Note:  .   Date:  05/18/2023  ID:  Lisa Lang, DOB 1927/01/10, MRN [id number] PCP: Lysle Saunas, MD (Inactive)  [facility] Providers Cardiologist:  Randene Bustard, MD { Click to update primary MD,subspecialty MD or APP then REFRESH:1}    No chief complaint on file.   Patient Profile: .     Lisa Lang is a *** 89 y.o. female *** with a PMH notable for *** who presents here for *** at the request of No ref. provider found.  Past medical history significant for: PAF. 11/06/2021: Successful cardioversion. On amiodarone .  Chest xray 01/06/2022: Mild pulmonary edema and possible small pleural effusions. Mild cardiomegaly.  TSH 01/06/2022: 13.784. CMP 01/06/2022: AST 82, ALT 85.  BNP 01/06/2022: 329.4. Event monitor 01/26/2022: Predominant rhythm was sinus.  Less than 1% ventricular and supraventricular ectopy.  No triggered episodes. Moderate to severe MR/chronic diastolic heart failure due to valvular disease. Echo 10/25/2021: EF 65 to 70%.  Moderately elevated pulmonary artery systolic pressure.  Moderately dilated bilateral atria.  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  The posterior leaflet is diminutive and there is at least moderate posterior MR.  Severe prolapse of both leaflets of the mitral valve.  Moderate MAC.  Moderate TR.  Moderate calcification/sclerosis of the aortic valve without stenosis.  Mild to moderate AR. Reviewed by valve team and felt patient was not favorable for MitraClip and she does not seem interested in discussing open MVR. Hyperlipidemia.     I last saw Lisa Lang December 23, 2021-at that time she was maintaining sinus rhythm with excessive warm labs during an hospitalization for choledocholithiasis.  She was on amiodarone  load and was started to note some signs of fatigue and weight loss. => Amiodarone  dose was reduced to 200 mg daily for 2 weeks and then was decreased to 1/2 tablet as of January 16, 2022.  We also converted from Lopressor  to Toprol  12.5 mg daily remained on Eliquis  2.5 mg twice daily.  There was concern about mitral prolapse exasperating A-fib and discussed referral to CVTS or structural heart.  Continued PRN Lasix , but use sparingly because of fear for dehydration..   was last seen on ***  Subjective  Discussed the use of AI scribe software for clinical note transcription with the patient, who gave verbal consent to proceed.  History of Present Illness  Lisa Lang presents here today overall feeling quite well with no major issues.  She just notes a little bit of exercise intolerance and fatigue.  She underwent cardioversion in October 2023 and has been on amiodarone  since then, currently taking 200 mg twice daily. Since the procedure, she has not experienced any episodes of rapid or irregular heartbeats, shortness of breath, or chest pain.  No issues with coughing, wheezing, or difficulty breathing during physical exertion. No history of syncope or cerebrovascular events. She reports occasional mild swelling in her ankles, which improves with elevation. She uses a cane due to balance issues following a foot fracture and subsequent therapy. No orthopnea or paroxysmal nocturnal dyspnea, and no chest tightness or pain.  -- She is on Eliquis  2.5 mg twice daily for anticoagulation and continues her cholesterol medication. Her cholesterol levels have not been checked recently.  She has alopecia and wears a cap when going out.   Her home systolic blood pressure was noted to be 112, and she has never had hypertension. She takes Toprol  12.5 mg, which does not  significantly affect her blood pressure.    Objective   Current Meds  Medication Sig  . acetaminophen  (TYLENOL ) 325 MG tablet Take 2 tablets (650 mg total) by mouth every 4 (four) hours as needed.  . amiodarone  (PACERONE ) 200 MG tablet TAKE 1 TABLET BY MOUTH TWICE DAILY OR AS DIRECTED  . apixaban  (ELIQUIS ) 2.5 MG  TABS tablet TAKE 1 TABLET TWICE A DAY  . atorvastatin  (LIPITOR) 40 MG tablet Take 40 mg by mouth at bedtime.  . calcium  carbonate (OS-CAL - DOSED IN MG OF ELEMENTAL CALCIUM ) 1250 MG tablet Take 1 tablet by mouth daily with breakfast.  . metoprolol  succinate (TOPROL -XL) 25 MG 24 hr tablet TAKE 1/2 TABLET(12.5 MG) BY MOUTH AT BEDTIME  . Multiple Vitamins-Minerals (MULTIVITAMIN PO) Take 1 tablet by mouth daily.  . pantoprazole (PROTONIX) 40 MG tablet Take 40 mg by mouth daily.  . Vitamin D , Ergocalciferol , 2000 units CAPS Take 2,000 Units by mouth daily.     Studies Reviewed: Aaron Aas   EKG Interpretation Date/Time:  Wednesday May 18 2023 15:45:52 EDT Ventricular Rate:  60 PR Interval:  244 QRS Duration:  170 QT Interval:  468 QTC Calculation: 468 R Axis:   185  Text Interpretation: Sinus rhythm with 1st degree A-V block Right bundle branch block with repolarization abnormality When compared with ECG of 21-Jan-2023 19:11, No significant change since last tracing Confirmed by Randene Bustard ([id number]) on 05/18/2023 3:54:10 PM    ECHO: *** CATH: *** MONITOR: *** CT: ***  Risk Assessment/Calculations:    CHA2DS2-VASc Score =    {Confirm score is correct.  If not, click here to update score.  REFRESH note.  :1} This indicates a  % annual risk of stroke. The patient's score is based upon:    {This patient has a significant risk of stroke if diagnosed with atrial fibrillation.  Please consider VKA or DOAC agent for anticoagulation if the bleeding risk is acceptable.   You can also use the SmartPhrase .[email or web address] for documentation.   :[id number]}           Physical Exam:   VS:  BP (!) 138/52   Pulse 60   Ht 5\' 2"  (1.575 m)   Wt 108 lb (49 kg)   SpO2 95%   BMI 19.75 kg/m    Wt Readings from Last 3 Encounters:  05/18/23 108 lb (49 kg)  01/05/23 107 lb 2.3 oz (48.6 kg)  04/21/22 94 lb (42.6 kg)    GEN: Well nourished, well developed in no acute distress; *** NECK: No JVD; No carotid  bruits CARDIAC: Normal S1, S2; RRR, no murmurs, rubs, gallops RESPIRATORY:  Clear to auscultation without rales, wheezing or rhonchi ; nonlabored, good air movement. ABDOMEN: Soft, non-tender, non-distended EXTREMITIES:  No edema; No deformity      ASSESSMENT AND PLAN: .    Problem List Items Addressed This Visit       Cardiology Problems   Paroxysmal atrial fibrillation (HCC) - Primary (Chronic)   Relevant Orders   EKG 12-Lead (Completed)    Assessment and Plan Assessment & Plan        {Are you ordering a CV Procedure (e.g. stress test, cath, DCCV, TEE, etc)?   Press F2        :[id number]}   Follow-Up: No follow-ups on file.  Total time spent: *** min spent with patient + *** min spent charting = *** min    Signed, Arleen Lacer, MD, MS Randene Bustard, M.D., M.S. Interventional Cardiologist  [employer], engineering # [phone]

## 2023-05-21 NOTE — Assessment & Plan Note (Signed)
 On appropriate age and renal function based dose of Eliquis  2.5 mg twice daily.  No major bleeding issues.  She does have recurrent paroxysms of A-fib and therefore we will try to avoid holding apixaban , but okay to hold for procedures or surgeries 2 to 3 days.

## 2023-05-23 ENCOUNTER — Other Ambulatory Visit: Payer: Self-pay | Admitting: Physician Assistant

## 2023-05-23 DIAGNOSIS — M810 Age-related osteoporosis without current pathological fracture: Secondary | ICD-10-CM | POA: Diagnosis not present

## 2023-05-23 DIAGNOSIS — I483 Typical atrial flutter: Secondary | ICD-10-CM | POA: Diagnosis not present

## 2023-05-23 DIAGNOSIS — R269 Unspecified abnormalities of gait and mobility: Secondary | ICD-10-CM | POA: Diagnosis not present

## 2023-05-23 DIAGNOSIS — R29898 Other symptoms and signs involving the musculoskeletal system: Secondary | ICD-10-CM

## 2023-05-23 DIAGNOSIS — E7800 Pure hypercholesterolemia, unspecified: Secondary | ICD-10-CM | POA: Diagnosis not present

## 2023-05-23 DIAGNOSIS — Z5181 Encounter for therapeutic drug level monitoring: Secondary | ICD-10-CM | POA: Diagnosis not present

## 2023-05-23 DIAGNOSIS — R946 Abnormal results of thyroid function studies: Secondary | ICD-10-CM | POA: Diagnosis not present

## 2023-05-23 DIAGNOSIS — L988 Other specified disorders of the skin and subcutaneous tissue: Secondary | ICD-10-CM | POA: Diagnosis not present

## 2023-05-23 LAB — LAB REPORT - SCANNED: EGFR: 46

## 2023-05-31 ENCOUNTER — Other Ambulatory Visit: Payer: Self-pay | Admitting: Cardiology

## 2023-06-02 ENCOUNTER — Emergency Department (HOSPITAL_COMMUNITY)

## 2023-06-02 ENCOUNTER — Encounter (HOSPITAL_COMMUNITY): Payer: Self-pay

## 2023-06-02 ENCOUNTER — Other Ambulatory Visit: Payer: Self-pay

## 2023-06-02 ENCOUNTER — Observation Stay (HOSPITAL_COMMUNITY)

## 2023-06-02 ENCOUNTER — Observation Stay (HOSPITAL_COMMUNITY)
Admission: EM | Admit: 2023-06-02 | Discharge: 2023-06-03 | Disposition: A | Discharge status: Home Health | Attending: Internal Medicine | Admitting: Internal Medicine

## 2023-06-02 DIAGNOSIS — I10 Essential (primary) hypertension: Secondary | ICD-10-CM | POA: Diagnosis not present

## 2023-06-02 DIAGNOSIS — I6523 Occlusion and stenosis of bilateral carotid arteries: Secondary | ICD-10-CM | POA: Diagnosis not present

## 2023-06-02 DIAGNOSIS — I609 Nontraumatic subarachnoid hemorrhage, unspecified: Principal | ICD-10-CM

## 2023-06-02 DIAGNOSIS — Y92009 Unspecified place in unspecified non-institutional (private) residence as the place of occurrence of the external cause: Secondary | ICD-10-CM | POA: Diagnosis not present

## 2023-06-02 DIAGNOSIS — M1811 Unilateral primary osteoarthritis of first carpometacarpal joint, right hand: Secondary | ICD-10-CM | POA: Diagnosis not present

## 2023-06-02 DIAGNOSIS — Z8679 Personal history of other diseases of the circulatory system: Secondary | ICD-10-CM

## 2023-06-02 DIAGNOSIS — S0990XA Unspecified injury of head, initial encounter: Secondary | ICD-10-CM | POA: Diagnosis present

## 2023-06-02 DIAGNOSIS — Z7401 Bed confinement status: Secondary | ICD-10-CM | POA: Diagnosis not present

## 2023-06-02 DIAGNOSIS — S066XAA Traumatic subarachnoid hemorrhage with loss of consciousness status unknown, initial encounter: Secondary | ICD-10-CM | POA: Diagnosis present

## 2023-06-02 DIAGNOSIS — I959 Hypotension, unspecified: Secondary | ICD-10-CM | POA: Diagnosis not present

## 2023-06-02 DIAGNOSIS — I480 Paroxysmal atrial fibrillation: Secondary | ICD-10-CM

## 2023-06-02 DIAGNOSIS — W19XXXA Unspecified fall, initial encounter: Secondary | ICD-10-CM

## 2023-06-02 DIAGNOSIS — S0003XA Contusion of scalp, initial encounter: Secondary | ICD-10-CM | POA: Diagnosis not present

## 2023-06-02 DIAGNOSIS — W01198A Fall on same level from slipping, tripping and stumbling with subsequent striking against other object, initial encounter: Secondary | ICD-10-CM | POA: Diagnosis not present

## 2023-06-02 DIAGNOSIS — S066X0A Traumatic subarachnoid hemorrhage without loss of consciousness, initial encounter: Principal | ICD-10-CM | POA: Insufficient documentation

## 2023-06-02 DIAGNOSIS — G319 Degenerative disease of nervous system, unspecified: Secondary | ICD-10-CM | POA: Diagnosis not present

## 2023-06-02 DIAGNOSIS — M79603 Pain in arm, unspecified: Secondary | ICD-10-CM | POA: Diagnosis not present

## 2023-06-02 DIAGNOSIS — I4891 Unspecified atrial fibrillation: Secondary | ICD-10-CM | POA: Diagnosis not present

## 2023-06-02 DIAGNOSIS — M810 Age-related osteoporosis without current pathological fracture: Secondary | ICD-10-CM | POA: Insufficient documentation

## 2023-06-02 DIAGNOSIS — S52501A Unspecified fracture of the lower end of right radius, initial encounter for closed fracture: Secondary | ICD-10-CM | POA: Diagnosis not present

## 2023-06-02 DIAGNOSIS — Z7901 Long term (current) use of anticoagulants: Secondary | ICD-10-CM | POA: Diagnosis not present

## 2023-06-02 DIAGNOSIS — S199XXA Unspecified injury of neck, initial encounter: Secondary | ICD-10-CM | POA: Diagnosis not present

## 2023-06-02 DIAGNOSIS — Z9181 History of falling: Secondary | ICD-10-CM | POA: Diagnosis present

## 2023-06-02 DIAGNOSIS — I5030 Unspecified diastolic (congestive) heart failure: Secondary | ICD-10-CM | POA: Insufficient documentation

## 2023-06-02 DIAGNOSIS — S0181XA Laceration without foreign body of other part of head, initial encounter: Secondary | ICD-10-CM | POA: Diagnosis not present

## 2023-06-02 DIAGNOSIS — R946 Abnormal results of thyroid function studies: Secondary | ICD-10-CM | POA: Insufficient documentation

## 2023-06-02 LAB — CBC WITH DIFFERENTIAL/PLATELET
Abs Immature Granulocytes: 0.06 10*3/uL (ref 0.00–0.07)
Basophils Absolute: 0 10*3/uL (ref 0.0–0.1)
Basophils Relative: 1 %
Eosinophils Absolute: 0.1 10*3/uL (ref 0.0–0.5)
Eosinophils Relative: 1 %
HCT: 42.3 % (ref 36.0–46.0)
Hemoglobin: 13.4 g/dL (ref 12.0–15.0)
Immature Granulocytes: 1 %
Lymphocytes Relative: 10 %
Lymphs Abs: 0.8 10*3/uL (ref 0.7–4.0)
MCH: 32.5 pg (ref 26.0–34.0)
MCHC: 31.7 g/dL (ref 30.0–36.0)
MCV: 102.7 fL — ABNORMAL HIGH (ref 80.0–100.0)
Monocytes Absolute: 0.8 10*3/uL (ref 0.1–1.0)
Monocytes Relative: 10 %
Neutro Abs: 6.6 10*3/uL (ref 1.7–7.7)
Neutrophils Relative %: 77 %
Platelets: 246 10*3/uL (ref 150–400)
RBC: 4.12 MIL/uL (ref 3.87–5.11)
RDW: 13.1 % (ref 11.5–15.5)
WBC: 8.4 10*3/uL (ref 4.0–10.5)
nRBC: 0 % (ref 0.0–0.2)

## 2023-06-02 LAB — BASIC METABOLIC PANEL WITH GFR
Anion gap: 5 (ref 5–15)
BUN: 27 mg/dL — ABNORMAL HIGH (ref 8–23)
CO2: 28 mmol/L (ref 22–32)
Calcium: 8.9 mg/dL (ref 8.9–10.3)
Chloride: 105 mmol/L (ref 98–111)
Creatinine, Ser: 1.2 mg/dL — ABNORMAL HIGH (ref 0.44–1.00)
GFR, Estimated: 42 mL/min — ABNORMAL LOW (ref >60)
Glucose, Bld: 116 mg/dL — ABNORMAL HIGH (ref 70–99)
Potassium: 4.1 mmol/L (ref 3.5–5.1)
Sodium: 138 mmol/L (ref 135–145)

## 2023-06-02 MED ORDER — LEVETIRACETAM 500 MG PO TABS
500.0000 mg | ORAL_TABLET | Freq: Two times a day (BID) | ORAL | Status: DC
  Administered 2023-06-02 – 2023-06-03 (×2): 500 mg via ORAL
  Filled 2023-06-02 (×3): qty 1

## 2023-06-02 MED ORDER — AMIODARONE HCL 200 MG PO TABS
100.0000 mg | ORAL_TABLET | Freq: Every day | ORAL | Status: DC
  Administered 2023-06-03: 100 mg via ORAL
  Filled 2023-06-02: qty 1

## 2023-06-02 MED ORDER — PROTHROMBIN COMPLEX CONC HUMAN 500 UNITS IV KIT
2681.0000 [IU] | PACK | Status: DC
  Filled 2023-06-02: qty 2681

## 2023-06-02 MED ORDER — ACETAMINOPHEN 650 MG RE SUPP: 650.0000 mg | Freq: Four times a day (QID) | RECTAL | Status: DC | PRN

## 2023-06-02 MED ORDER — CALCIUM CARBONATE 1250 (500 CA) MG PO TABS
1.0000 | ORAL_TABLET | Freq: Every day | ORAL | Status: DC
  Administered 2023-06-03: 1250 mg via ORAL
  Filled 2023-06-02: qty 1

## 2023-06-02 MED ORDER — ACETAMINOPHEN 325 MG PO TABS
650.0000 mg | ORAL_TABLET | Freq: Four times a day (QID) | ORAL | Status: DC | PRN
  Administered 2023-06-02 – 2023-06-03 (×3): 650 mg via ORAL
  Filled 2023-06-02 (×3): qty 2

## 2023-06-02 MED ORDER — METOPROLOL SUCCINATE ER 25 MG PO TB24
12.5000 mg | ORAL_TABLET | Freq: Every day | ORAL | Status: DC
  Administered 2023-06-03: 12.5 mg via ORAL
  Filled 2023-06-02: qty 1

## 2023-06-02 MED ORDER — ONDANSETRON HCL 4 MG PO TABS: 4.0000 mg | ORAL_TABLET | Freq: Four times a day (QID) | ORAL | Status: DC | PRN

## 2023-06-02 MED ORDER — VITAMIN D 25 MCG (1000 UNIT) PO TABS
2000.0000 [IU] | ORAL_TABLET | Freq: Every day | ORAL | Status: DC
  Administered 2023-06-03: 2000 [IU] via ORAL
  Filled 2023-06-02: qty 2

## 2023-06-02 MED ORDER — ONDANSETRON HCL 4 MG/2ML IJ SOLN: 4.0000 mg | Freq: Four times a day (QID) | INTRAMUSCULAR | Status: DC | PRN

## 2023-06-02 NOTE — H&P (Addendum)
 History and Physical    Patient: Lisa Lang:[id number] DOB: Oct 29, 1927 DOA: 06/02/2023 DOS: the patient was seen and examined on 06/02/2023 PCP: Lisa Saunas, MD (Inactive)  Patient coming from: Home  Chief Complaint:  Chief Complaint  Patient presents with   Fall   HPI: Lisa Lang is a 89 y.o. female with medical history significant of paroxysmal atrial fibrillation on Eliquis , hyperlipidemia, mod to severe MR, HFpEF presented after a fall and was noted to have a subarachnoid hemorrhage.  He was admitted to the hospital service for further monitoring.  Patient states that she was coming in her back door in a hurry and tripped on a box.  She fell on her hands and face.  She denies any lightheadedness or dizziness, palpitations prior to her fall.  She does note that she walks with a cane.  Her last fall was about a year ago and occurred when patient was again moving too quickly and had a mechanical fall.     She denies any issues with urinating or stooling.  She denies dyspnea.  On imaging in the ED she was found to have a subarachnoid hemorrhage and neurosurgery was consulted and recommended that patient be admitted for observation.  Review of Systems: As mentioned in the history of present illness. All other systems reviewed and are negative. Past Medical History:  Diagnosis Date   Arthritis    Diverticulitis 1995   And 2007   Dysrhythmia    Hyperlipidemia LDL goal <100    Mitral valve prolapse    Bileaflet MVP with moderate MR.   PAF (paroxysmal atrial fibrillation) (HCC) 11/2017   CHA2DS2 VASc score > 3 (Age >4 and female sex), --> Eliquis  & Metoprolol     Past Surgical History:  Procedure Laterality Date   CARDIOVERSION N/A 10/28/2021   Procedure: CARDIOVERSION;  Surgeon: Lisa Dust, MD;  Location: [facility] ENDOSCOPY;  Service: Cardiovascular;  Laterality: N/A;   CARDIOVERSION N/A 11/06/2021   Procedure: CARDIOVERSION;  Surgeon: Lisa Melody, MD;   Location: [facility] ENDOSCOPY;  Service: Cardiovascular;  Laterality: N/A;   CATARACT EXTRACTION, BILATERAL     CHOLECYSTECTOMY N/A 11/24/2021   Procedure: LAPAROSCOPIC CHOLECYSTECTOMY;  Surgeon: Lisa Billow, MD;  Location: [facility] ORS;  Service: General;  Laterality: N/A;   ERCP N/A 11/23/2021   Procedure: ENDOSCOPIC RETROGRADE CHOLANGIOPANCREATOGRAPHY (ERCP);  Surgeon: Lisa Ken, MD;  Location: [facility] ENDOSCOPY;  Service: Gastroenterology;  Laterality: N/A;   REMOVAL OF STONES  11/23/2021   Procedure: REMOVAL OF STONES;  Surgeon: Lisa Ken, MD;  Location: [facility] ENDOSCOPY;  Service: Gastroenterology;;   Lisa Lang  11/23/2021   Procedure: Lisa Lang;  Surgeon: Lisa Ken, MD;  Location: [facility] ENDOSCOPY;  Service: Gastroenterology;;   TEE WITHOUT CARDIOVERSION N/A 10/28/2021   Procedure: TRANSESOPHAGEAL ECHOCARDIOGRAM (TEE) - SYNCHRONIZED DIRECT CURRENT CARDIOVERSION (DCCV);  Surgeon: Lisa Dust, MD;  Location: [facility] ENDOSCOPY;  Service: CV;; LVEF 65-70%. No RWMA . Mod LA dilation w/o thrombus. Normal RV, Mod RA dilation. 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 Bileaflet Prolapse w/ Large Post & small Ant Jet.= 3+/Mod MR - no PVV reversal. = DCCV   TRANSTHORACIC ECHOCARDIOGRAM  10/25/2021   Notably he was very elderly woman.  No acute distress.  Just seems worn out.   TRANSTHORACIC ECHOCARDIOGRAM  12/12/2019    EF 60 to 65%.  No R WMA.  Unable to assess diastolic pressures.  Normal RV pressures.  Mild biatrial enlargement.  Moderate aortic calcification - no AS.  Severe BI-Leaflet MVP  with Mod-Severe MR   Social History:  reports that she has never smoked. She has never used smokeless tobacco. She reports that she does not drink alcohol and does not use drugs.  Allergies  Allergen Reactions   Ciprofloxacin Diarrhea   Sulfa Antibiotics     unknown   Amoxicillin Rash   Lidocaine  Palpitations    unknown    Family History  Problem Relation Age of Onset   Cervical cancer  Mother    CAD Father    Lung cancer Sister    Stroke Neg Hx    Diabetes Neg Hx     Prior to Admission medications   Medication Sig Start Date End Date Taking? Authorizing Provider  acetaminophen  (TYLENOL ) 325 MG tablet Take 2 tablets (650 mg total) by mouth every 4 (four) hours as needed. 11/25/21   Lisa Homes, MD  amiodarone  (PACERONE ) 100 MG tablet Take 1 tablet (100 mg total) by mouth daily. 05/18/23   Lisa Lacer, MD  apixaban  (ELIQUIS ) 2.5 MG TABS tablet TAKE 1 TABLET TWICE A DAY 01/20/23   Lisa Lacer, MD  atorvastatin  (LIPITOR) 40 MG tablet Take 40 mg by mouth at bedtime. 09/05/17   [provider]  calcium  carbonate (OS-CAL - DOSED IN MG OF ELEMENTAL CALCIUM ) 1250 MG tablet Take 1 tablet by mouth daily with breakfast.    [provider]  furosemide  (LASIX ) 20 MG tablet TAKE 1 TABLET BY MOUTH EVERY DAY AS NEEDED Patient not taking: Reported on 01/04/2023 07/05/22   Lisa Lacer, MD  metoprolol  succinate (TOPROL -XL) 25 MG 24 hr tablet TAKE 1/2 TABLET(12.5 MG) BY MOUTH AT BEDTIME 05/31/23   Lisa Lacer, MD  Multiple Vitamins-Minerals (MULTIVITAMIN PO) Take 1 tablet by mouth daily.    [provider]  pantoprazole (PROTONIX) 40 MG tablet Take 40 mg by mouth daily. 01/11/22   [provider]  Vitamin D , Ergocalciferol , 2000 units CAPS Take 2,000 Units by mouth daily.    [provider]    Physical Exam: Vitals:   06/02/23 1530 06/02/23 1730 06/02/23 1900 06/02/23 2020  BP: (!) 146/60 (!) 135/55 (!) 121/103 (!) 154/68  Pulse: 66 (!) 55 (!) 58 65  Resp: 16 18 19 18   Temp:  97.9 F (36.6 C)  98.6 F (37 C)  TempSrc:    Oral  SpO2: 96% 95% 98% 99%  Weight:      Height:       Physical Exam  Constitutional: In no distress.  Cardiovascular: Normal rate, regular rhythm. Pitting edema 2+ in R ankle. L ankle with 1+ edema.  Pulmonary: Non labored breathing on room air, no wheezing or rales.   Abdominal: Soft. Normal bowel  sounds. Non distended and non tender Musculoskeletal: Normal range of motion.     Neurological: Alert and oriented to person, place, and time. Non focal  Skin: Skin is warm and dry.   Data Reviewed:  CT Head Wo Contrast Result Date: 06/02/2023 CLINICAL DATA:  Blunt trauma, fall. EXAM: CT HEAD WITHOUT CONTRAST CT CERVICAL SPINE WITHOUT CONTRAST TECHNIQUE: Multidetector CT imaging of the head and cervical spine was performed following the standard protocol without intravenous contrast. Multiplanar CT image reconstructions of the cervical spine were also generated. RADIATION DOSE REDUCTION: This exam was performed according to the departmental dose-optimization program which includes automated exposure control, adjustment of the mA and/or kV according to patient size and/or use of iterative reconstruction technique. COMPARISON:  None Available. FINDINGS: CT HEAD FINDINGS Brain: Small amount  of subarachnoid hemorrhage in the left occipital lobe, series 3 image 16. No midline shift or mass effect. No hemorrhage in the ventricular system. No intraparenchymal or subdural hemorrhage. Stable degree of generalized atrophy and chronic small vessel ischemia. No evidence of acute infarct. Vascular: Atherosclerosis of skullbase vasculature without hyperdense vessel or abnormal calcification. Skull: No fracture or focal lesion. Sinuses/Orbits: No acute finding. Other: Minimal right frontal scalp hematoma. Traumatic Brain Injury Risk Stratification Skull Fracture: No - Low/mBIG 1 Subdural Hematoma (SDH): No - Low Subarachnoid Hemorrhage [facility]): trace (up to 2 sulci) - Low/mBIG 1 Epidural Hematoma (EDH): No - Low/mBIG 1 Cerebral contusion, intra-axial, intraparenchymal Hemorrhage (IPH): No Intraventricular Hemorrhage (IVH): No - Low/mBIG 1 Midline Shift > 1mm or Edema/effacement of sulci/vents: No - Low/mBIG 1 ---------------------------------------------------- CT CERVICAL SPINE FINDINGS Alignment: Grade 1 anterolisthesis of  C4 on C5, unchanged from prior exam. No traumatic subluxation. Skull base and vertebrae: No acute fracture. Vertebral body heights are maintained. The dens and skull base are intact. Scattered bone islands. Soft tissues and spinal canal: No prevertebral fluid or swelling. No visible canal hematoma. Disc levels: Stable degenerative disc disease most prominently affecting C5-C6 stable. Facet multilevel hypertrophy. Upper chest: Chronic right greater than left apical pleuroparenchymal scarring. Other: Carotid calcifications IMPRESSION: 1. Small amount of subarachnoid hemorrhage in the left occipital lobe. 2. Minimal right frontal scalp hematoma. 3. No acute fracture or subluxation of the cervical spine. Critical Value/emergent results were called by telephone at the time of interpretation on 06/02/2023 at 4:13 pm to provider [facility] , who verbally acknowledged these results. Electronically Signed   By: Chadwick Colonel M.D.   On: 06/02/2023 16:13   CT Cervical Spine Wo Contrast Result Date: 06/02/2023 CLINICAL DATA:  Blunt trauma, fall. EXAM: CT HEAD WITHOUT CONTRAST CT CERVICAL SPINE WITHOUT CONTRAST TECHNIQUE: Multidetector CT imaging of the head and cervical spine was performed following the standard protocol without intravenous contrast. Multiplanar CT image reconstructions of the cervical spine were also generated. RADIATION DOSE REDUCTION: This exam was performed according to the departmental dose-optimization program which includes automated exposure control, adjustment of the mA and/or kV according to patient size and/or use of iterative reconstruction technique. COMPARISON:  None Available. FINDINGS: CT HEAD FINDINGS Brain: Small amount of subarachnoid hemorrhage in the left occipital lobe, series 3 image 16. No midline shift or mass effect. No hemorrhage in the ventricular system. No intraparenchymal or subdural hemorrhage. Stable degree of generalized atrophy and chronic small vessel ischemia. No  evidence of acute infarct. Vascular: Atherosclerosis of skullbase vasculature without hyperdense vessel or abnormal calcification. Skull: No fracture or focal lesion. Sinuses/Orbits: No acute finding. Other: Minimal right frontal scalp hematoma. Traumatic Brain Injury Risk Stratification Skull Fracture: No - Low/mBIG 1 Subdural Hematoma (SDH): No - Low Subarachnoid Hemorrhage [facility]): trace (up to 2 sulci) - Low/mBIG 1 Epidural Hematoma (EDH): No - Low/mBIG 1 Cerebral contusion, intra-axial, intraparenchymal Hemorrhage (IPH): No Intraventricular Hemorrhage (IVH): No - Low/mBIG 1 Midline Shift > 1mm or Edema/effacement of sulci/vents: No - Low/mBIG 1 ---------------------------------------------------- CT CERVICAL SPINE FINDINGS Alignment: Grade 1 anterolisthesis of C4 on C5, unchanged from prior exam. No traumatic subluxation. Skull base and vertebrae: No acute fracture. Vertebral body heights are maintained. The dens and skull base are intact. Scattered bone islands. Soft tissues and spinal canal: No prevertebral fluid or swelling. No visible canal hematoma. Disc levels: Stable degenerative disc disease most prominently affecting C5-C6 stable. Facet multilevel hypertrophy. Upper chest: Chronic right greater than left apical pleuroparenchymal scarring. Other:  Carotid calcifications IMPRESSION: 1. Small amount of subarachnoid hemorrhage in the left occipital lobe. 2. Minimal right frontal scalp hematoma. 3. No acute fracture or subluxation of the cervical spine. Critical Value/emergent results were called by telephone at the time of interpretation on 06/02/2023 at 4:13 pm to provider [facility] , who verbally acknowledged these results. Electronically Signed   By: Chadwick Colonel M.D.   On: 06/02/2023 16:13   DG Wrist Complete Right Result Date: 06/02/2023 CLINICAL DATA:  Right wrist pain after fall. EXAM: RIGHT WRIST - COMPLETE 3+ VIEW COMPARISON:  None Available. FINDINGS: Minimally displaced impaction fracture of  the distal radial metaphysis. No convincing intra-articular involvement. The distal radioulnar joint is congruent. 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 The fourth digit is held in flexion at the proximal interphalangeal joint on all views. Soft tissue edema seen at the fracture site. IMPRESSION: 1. Minimally displaced impaction fracture of the distal radial metaphysis. 2. The fourth digit is held in flexion at the proximal interphalangeal joint on all views. This is of unknown acuity, clinical correlation is advised. Electronically Signed   By: Chadwick Colonel M.D.   On: 06/02/2023 16:03     Assessment and Plan:  #Small SAH  in L occipital lobe Currently with no neurological deficits.  Neurosurgery was consulted by the ED.  They recommended repeat CT of the head and starting Keppra  for seizure prophylaxis. - Hold patient's Eliquis   #R distal radial fracture in the setting of a fall  #Osteoporosis  Given patient's fall, age, known osteoporosis on vitamin D  supplementation, and radial fracture will check vitamin D  level to ensure at appropriate level. For her radial fracture, non operative management was recommended and patient is in sling.   # Paroxysmal A-fib In normal sinus rhythm.  Hold Eliquis , continue amiodarone  and metoprolol  - Check LFTs and repeat TSH given normal values 01/2022    Advance Care Planning:   Code Status: Full Code   Consults: Neurosurgery  Family Communication: Son at bedside  Severity of Illness: The appropriate patient status for this patient is OBSERVATION. Observation status is judged to be reasonable and necessary in order to provide the required intensity of service to ensure the patient's safety. The patient's presenting symptoms, physical exam findings, and initial radiographic and laboratory data in the context of their medical condition is felt to place them at decreased risk for further clinical deterioration. Furthermore, it is anticipated  that the patient will be medically stable for discharge from the hospital within 2 midnights of admission.   Author: Joette Mustard, MD 06/02/2023 11:06 PM  For on call review [email or web address].

## 2023-06-02 NOTE — ED Provider Notes (Addendum)
 [place] [facility] AT [facility] Provider Note   CSN: [id number] Arrival date & time: 06/02/23  1333     History  Chief Complaint  Patient presents with   Lisa Lang is a 89 y.o. female.  With a history of atrial fibrillation on Eliquis  who presents to the ED after fall.  Patient reports tripping and falling in her home today.  She fell onto the kitchen floor striking her head and outstretched hands.  No loss of consciousness.  Now with pain in her right wrist.  Her son helped her up and she was able to ambulate after the fall.  Denies headache neck pain chest pain shortness of breath abdominal pain and pain in other extremities.  Last dose of Eliquis  was this morning   Fall       Home Medications Prior to Admission medications   Medication Sig Start Date End Date Taking? Authorizing Provider  acetaminophen  (TYLENOL ) 325 MG tablet Take 2 tablets (650 mg total) by mouth every 4 (four) hours as needed. 11/25/21   Faith Homes, MD  amiodarone  (PACERONE ) 100 MG tablet Take 1 tablet (100 mg total) by mouth daily. 05/18/23   Arleen Lacer, MD  apixaban  (ELIQUIS ) 2.5 MG TABS tablet TAKE 1 TABLET TWICE A DAY 01/20/23   Arleen Lacer, MD  atorvastatin  (LIPITOR) 40 MG tablet Take 40 mg by mouth at bedtime. 09/05/17   [provider]  calcium  carbonate (OS-CAL - DOSED IN MG OF ELEMENTAL CALCIUM ) 1250 MG tablet Take 1 tablet by mouth daily with breakfast.    [provider]  furosemide  (LASIX ) 20 MG tablet TAKE 1 TABLET BY MOUTH EVERY DAY AS NEEDED Patient not taking: Reported on 01/04/2023 07/05/22   Arleen Lacer, MD  metoprolol  succinate (TOPROL -XL) 25 MG 24 hr tablet TAKE 1/2 TABLET(12.5 MG) BY MOUTH AT BEDTIME 05/31/23   Arleen Lacer, MD  Multiple Vitamins-Minerals (MULTIVITAMIN PO) Take 1 tablet by mouth daily.    [provider]  pantoprazole (PROTONIX) 40 MG tablet Take 40 mg by mouth daily. 01/11/22   [provider]  Vitamin D , Ergocalciferol , 2000 units CAPS Take 2,000 Units by mouth daily.    [provider]      Allergies    Ciprofloxacin, Sulfa antibiotics, Amoxicillin, and Lidocaine     Review of Systems   Review of Systems  Physical Exam Updated Vital Signs BP (!) 146/60   Pulse 66   Temp 98 F (36.7 C) (Oral)   Resp 16   Ht 5\' 2"  (1.575 m)   Wt 49 kg   SpO2 96%   BMI 19.76 kg/m  Physical Exam Vitals and nursing note reviewed.  HENT:     Head:     Comments: Right frontal hematoma and superficial half centimeter punctate laceration overlying right frontal hematoma Abrasion over bridge of nose with no deformity No septal hematoma Eyes:     Extraocular Movements: Extraocular movements intact.     Pupils: Pupils are equal, round, and reactive to light.  Cardiovascular:     Rate and Rhythm: Normal rate and regular rhythm.  Pulmonary:     Effort: Pulmonary effort is normal.     Breath sounds: Normal breath sounds.  Abdominal:     Palpations: Abdomen is soft.     Tenderness: There is no abdominal tenderness.  Musculoskeletal:     Cervical back: Neck supple. No tenderness.     Comments: Skin tear over  dorsal aspect of fourth MCP on the left hand Bony tenderness over distal radius and ulna with no deformity Diminished motor strength in right hand secondary to pain Sensation tact light touch throughout bilateral hands 2+ radial pulse bilaterally Full active range of motion bilateral lower extremities Sensation tact light touch throughout lower extremities No midline tenderness step-off deformity of back  Skin:    General: Skin is warm and dry.  Neurological:     Mental Status: She is alert.  Psychiatric:        Mood and Affect: Mood normal.     ED Results / Procedures / Treatments   Labs (all labs ordered are listed, but only abnormal results are displayed) Labs Reviewed  CBC WITH DIFFERENTIAL/PLATELET - Abnormal; Notable for the following  components:      Result Value   MCV 102.7 (*)    All other components within normal limits  BASIC METABOLIC PANEL WITH GFR - Abnormal; Notable for the following components:   Glucose, Bld 116 (*)    BUN 27 (*)    Creatinine, Ser 1.20 (*)    GFR, Estimated 42 (*)    All other components within normal limits  URINALYSIS, ROUTINE W REFLEX MICROSCOPIC    EKG None  Radiology CT Head Wo Contrast Result Date: 06/02/2023 CLINICAL DATA:  Blunt trauma, fall. EXAM: CT HEAD WITHOUT CONTRAST CT CERVICAL SPINE WITHOUT CONTRAST TECHNIQUE: Multidetector CT imaging of the head and cervical spine was performed following the standard protocol without intravenous contrast. Multiplanar CT image reconstructions of the cervical spine were also generated. RADIATION DOSE REDUCTION: This exam was performed according to the departmental dose-optimization program which includes automated exposure control, adjustment of the mA and/or kV according to patient size and/or use of iterative reconstruction technique. COMPARISON:  None Available. FINDINGS: CT HEAD FINDINGS Brain: Small amount of subarachnoid hemorrhage in the left occipital lobe, series 3 image 16. No midline shift or mass effect. No hemorrhage in the ventricular system. No intraparenchymal or subdural hemorrhage. Stable degree of generalized atrophy and chronic small vessel ischemia. No evidence of acute infarct. Vascular: Atherosclerosis of skullbase vasculature without hyperdense vessel or abnormal calcification. Skull: No fracture or focal lesion. Sinuses/Orbits: No acute finding. Other: Minimal right frontal scalp hematoma. Traumatic Brain Injury Risk Stratification Skull Fracture: No - Low/mBIG 1 Subdural Hematoma (SDH): No - Low Subarachnoid Hemorrhage [facility]): trace (up to 2 sulci) - Low/mBIG 1 Epidural Hematoma (EDH): No - Low/mBIG 1 Cerebral contusion, intra-axial, intraparenchymal Hemorrhage (IPH): No Intraventricular Hemorrhage (IVH): No - Low/mBIG 1  Midline Shift > 1mm or Edema/effacement of sulci/vents: No - Low/mBIG 1 ---------------------------------------------------- CT CERVICAL SPINE FINDINGS Alignment: Grade 1 anterolisthesis of C4 on C5, unchanged from prior exam. No traumatic subluxation. Skull base and vertebrae: No acute fracture. Vertebral body heights are maintained. The dens and skull base are intact. Scattered bone islands. Soft tissues and spinal canal: No prevertebral fluid or swelling. No visible canal hematoma. Disc levels: Stable degenerative disc disease most prominently affecting C5-C6 stable. Facet multilevel hypertrophy. Upper chest: Chronic right greater than left apical pleuroparenchymal scarring. Other: Carotid calcifications IMPRESSION: 1. Small amount of subarachnoid hemorrhage in the left occipital lobe. 2. Minimal right frontal scalp hematoma. 3. No acute fracture or subluxation of the cervical spine. Critical Value/emergent results were called by telephone at the time of interpretation on 06/02/2023 at 4:13 pm to provider [facility] , who verbally acknowledged these results. Electronically Signed   By: Chadwick Colonel M.D.   On: 06/02/2023 16:13  CT Cervical Spine Wo Contrast Result Date: 06/02/2023 CLINICAL DATA:  Blunt trauma, fall. EXAM: CT HEAD WITHOUT CONTRAST CT CERVICAL SPINE WITHOUT CONTRAST TECHNIQUE: Multidetector CT imaging of the head and cervical spine was performed following the standard protocol without intravenous contrast. Multiplanar CT image reconstructions of the cervical spine were also generated. RADIATION DOSE REDUCTION: This exam was performed according to the departmental dose-optimization program which includes automated exposure control, adjustment of the mA and/or kV according to patient size and/or use of iterative reconstruction technique. COMPARISON:  None Available. FINDINGS: CT HEAD FINDINGS Brain: Small amount of subarachnoid hemorrhage in the left occipital lobe, series 3 image 16. No  midline shift or mass effect. No hemorrhage in the ventricular system. No intraparenchymal or subdural hemorrhage. Stable degree of generalized atrophy and chronic small vessel ischemia. No evidence of acute infarct. Vascular: Atherosclerosis of skullbase vasculature without hyperdense vessel or abnormal calcification. Skull: No fracture or focal lesion. Sinuses/Orbits: No acute finding. Other: Minimal right frontal scalp hematoma. Traumatic Brain Injury Risk Stratification Skull Fracture: No - Low/mBIG 1 Subdural Hematoma (SDH): No - Low Subarachnoid Hemorrhage [facility]): trace (up to 2 sulci) - Low/mBIG 1 Epidural Hematoma (EDH): No - Low/mBIG 1 Cerebral contusion, intra-axial, intraparenchymal Hemorrhage (IPH): No Intraventricular Hemorrhage (IVH): No - Low/mBIG 1 Midline Shift > 1mm or Edema/effacement of sulci/vents: No - Low/mBIG 1 ---------------------------------------------------- CT CERVICAL SPINE FINDINGS Alignment: Grade 1 anterolisthesis of C4 on C5, unchanged from prior exam. No traumatic subluxation. Skull base and vertebrae: No acute fracture. Vertebral body heights are maintained. The dens and skull base are intact. Scattered bone islands. Soft tissues and spinal canal: No prevertebral fluid or swelling. No visible canal hematoma. Disc levels: Stable degenerative disc disease most prominently affecting C5-C6 stable. Facet multilevel hypertrophy. Upper chest: Chronic right greater than left apical pleuroparenchymal scarring. Other: Carotid calcifications IMPRESSION: 1. Small amount of subarachnoid hemorrhage in the left occipital lobe. 2. Minimal right frontal scalp hematoma. 3. No acute fracture or subluxation of the cervical spine. Critical Value/emergent results were called by telephone at the time of interpretation on 06/02/2023 at 4:13 pm to provider [facility] , who verbally acknowledged these results. Electronically Signed   By: Chadwick Colonel M.D.   On: 06/02/2023 16:13   DG Wrist Complete  Right Result Date: 06/02/2023 CLINICAL DATA:  Right wrist pain after fall. EXAM: RIGHT WRIST - COMPLETE 3+ VIEW COMPARISON:  None Available. FINDINGS: Minimally displaced impaction fracture of the distal radial metaphysis. No convincing intra-articular involvement. The distal radioulnar joint is congruent. 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 The fourth digit is held in flexion at the proximal interphalangeal joint on all views. Soft tissue edema seen at the fracture site. IMPRESSION: 1. Minimally displaced impaction fracture of the distal radial metaphysis. 2. The fourth digit is held in flexion at the proximal interphalangeal joint on all views. This is of unknown acuity, clinical correlation is advised. Electronically Signed   By: Chadwick Colonel M.D.   On: 06/02/2023 16:03    Procedures Procedures    Medications Ordered in ED Medications  levETIRAcetam (KEPPRA) tablet 500 mg (has no administration in time range)    ED Course/ Medical Decision Making/ A&P Clinical Course as of 06/02/23 1725  Thu Jun 02, 2023  1640 CT head shows left occipital subarachnoid hemorrhage and a right frontal hematoma.  C-spine unremarkable.  Right wrist x-ray shows nondisplaced distal radius fracture.  Paged neurosurgery.  Will apply splint for distal radius fracture and instruct for orthopedic follow-up  after discharge [MP]  1655 Discussed with neurosurgery who agrees with plan for repeat scan in 6 hours from original.  Will admit to medicine [MP]  1704 Neurosurgery PA Darryl Endow) has discussed with neurosurgery attending.  No need for anticoagulation reversal with Kcentra.  Repeat scan and start Keppra twice daily 500 mg for 7 days [MP]  1713 Discussed with admitting hospitalist accepts patient for admission [MP]    Clinical Course User Index [MP] Sallyanne Creamer, DO                                 Medical Decision Making 89 year old female with history as above presenting after fall  at home.  Mechanical fall onto kitchen floor.  Last dose of Eliquis  was this morning.  No obvious head trauma.  No LOC.  Also reporting right wrist pain.  Will obtain CT head C-spine to look for traumatic findings on imaging including intracranial hemorrhage.  Also obtain x-ray of the right wrist.  Left upper extremity and lower extremities look okay.  No abdominal tenderness or chest wall tenderness.  Hemodynamically stable.  If there is evidence of intracranial bleeding we will plan for reversal with Kcentra  Amount and/or Complexity of Data Reviewed Radiology: ordered.  Risk Prescription drug management. Decision regarding hospitalization.           Final Clinical Impression(s) / ED Diagnoses Final diagnoses:  Subarachnoid hemorrhage (HCC)  Closed fracture of distal end of right radius, unspecified fracture morphology, initial encounter  Fall, initial encounter  History of atrial fibrillation    Rx / DC Orders ED Discharge Orders     None         Sallyanne Creamer, DO 06/02/23 1714    Sallyanne Creamer, DO 06/02/23 1725

## 2023-06-02 NOTE — Progress Notes (Signed)
 Orthopedic Tech Progress Note Patient Details:  Lisa Lang 1927/09/04 [id number]  Ortho Devices Type of Ortho Device: Ace wrap, Cotton web roll, Shoulder immobilizer, Volar splint Ortho Device/Splint Location: right volar applied. right sling applied Ortho Device/Splint Interventions: Ordered, Application, Adjustment   Post Interventions Patient Tolerated: Well Instructions Provided: Adjustment of device, Care of device  Leodis Rainwater 06/02/2023, 4:51 PM

## 2023-06-02 NOTE — ED Notes (Signed)
 Patient is being picked up by [facility] at this time, family was unaware patient was being admitted and transported.

## 2023-06-02 NOTE — Hospital Course (Addendum)
 Afib on eliquis    Fall at home today.   Fell on her face   Distal radial fracture   R frontal hematoma, SAH   Repeat scan tonight, start keppra, if hemorrhage stable onrepeat scan and neurologically stable nothing further per NSGY No need to reverse elquis per NSGY.     No neuro deficits   A&)x3  Pitting edema 2+ in R ankle. L ankle with 1+ edema.

## 2023-06-02 NOTE — ED Provider Triage Note (Signed)
 Emergency Medicine Provider Triage Evaluation Note  Lisa Lang , a 89 y.o. female  was evaluated in triage.  Pt complains of fall.  This morning patient was walking inside her house, tripped over a box and fell forward striking her head but denies any loss of consciousness.  Son was able to assist her up.  Primary complaint is pain to her right wrist.  She suffered a laceration to her right forehead.  Endorses mild left shoulder pain.  She is up-to-date with tetanus.  Denies precipitating symptoms prior to the fall. Pt on blood thinner for afib  Review of Systems  Positive: As above Negative: As above  Physical Exam  BP (!) 148/64   Pulse (!) 58   Temp 98 F (36.7 C) (Oral)   Resp 14   Ht 5\' 2"  (1.575 m)   Wt 49 kg   SpO2 100%   BMI 19.76 kg/m  Gen:   Awake, no distress   Resp:  Normal effort  MSK:   Moves extremities without difficulty  Other:  Lac R forehead.  Ttp R wrist  Medical Decision Making  Medically screening exam initiated at 2:17 PM.  Appropriate orders placed.  STEVANA DUFNER was informed that the remainder of the evaluation will be completed by another provider, this initial triage assessment does not replace that evaluation, and the importance of remaining in the ED until their evaluation is complete.     Debbra Fairy, PA-C 06/02/23 1419

## 2023-06-02 NOTE — Progress Notes (Signed)
 Pt presented to ED after a fall earlier today. On Eliquis  for h/o afib. Neuro intact per EDP. Workup revealing small occipital lobe SAH. Recommend holding Eliquis , interval repeat CTH 8H, and prophylactic Keppra  500mg  BID x7d. Pending stability of repeat scan, will likely recommend holding Eliquis  x7d and completing course of Keppra  with outpt f/u PRN.   Brittnae Aschenbrenner CAYLIN Kristianne Albin, PA-C

## 2023-06-02 NOTE — ED Triage Notes (Signed)
 Patient presented to ER after fall, patient endorses hitting head, small laceration noted to forehead and left hand. Patient states she tripped and fell, denies LOC, but does take blood thinners. Patient endorses pain to left wrist.

## 2023-06-03 ENCOUNTER — Other Ambulatory Visit (HOSPITAL_COMMUNITY): Payer: Self-pay

## 2023-06-03 DIAGNOSIS — S066X0A Traumatic subarachnoid hemorrhage without loss of consciousness, initial encounter: Secondary | ICD-10-CM | POA: Diagnosis not present

## 2023-06-03 DIAGNOSIS — Z8679 Personal history of other diseases of the circulatory system: Secondary | ICD-10-CM

## 2023-06-03 DIAGNOSIS — S52501A Unspecified fracture of the lower end of right radius, initial encounter for closed fracture: Secondary | ICD-10-CM

## 2023-06-03 DIAGNOSIS — I480 Paroxysmal atrial fibrillation: Secondary | ICD-10-CM

## 2023-06-03 DIAGNOSIS — I609 Nontraumatic subarachnoid hemorrhage, unspecified: Secondary | ICD-10-CM

## 2023-06-03 LAB — URINALYSIS, ROUTINE W REFLEX MICROSCOPIC
Bilirubin Urine: NEGATIVE
Glucose, UA: NEGATIVE mg/dL
Hgb urine dipstick: NEGATIVE
Ketones, ur: NEGATIVE mg/dL
Leukocytes,Ua: NEGATIVE
Nitrite: NEGATIVE
Protein, ur: NEGATIVE mg/dL
Specific Gravity, Urine: 1.01 (ref 1.005–1.030)
pH: 6 (ref 5.0–8.0)

## 2023-06-03 LAB — HEPATIC FUNCTION PANEL
ALT: 38 U/L (ref 0–44)
AST: 41 U/L (ref 15–41)
Albumin: 2.9 g/dL — ABNORMAL LOW (ref 3.5–5.0)
Alkaline Phosphatase: 88 U/L (ref 38–126)
Bilirubin, Direct: 0.1 mg/dL (ref 0.0–0.2)
Indirect Bilirubin: 0.8 mg/dL (ref 0.3–0.9)
Total Bilirubin: 0.9 mg/dL (ref 0.0–1.2)
Total Protein: 5.5 g/dL — ABNORMAL LOW (ref 6.5–8.1)

## 2023-06-03 LAB — BASIC METABOLIC PANEL WITH GFR
Anion gap: 9 (ref 5–15)
BUN: 18 mg/dL (ref 8–23)
CO2: 25 mmol/L (ref 22–32)
Calcium: 8.8 mg/dL — ABNORMAL LOW (ref 8.9–10.3)
Chloride: 105 mmol/L (ref 98–111)
Creatinine, Ser: 1.05 mg/dL — ABNORMAL HIGH (ref 0.44–1.00)
GFR, Estimated: 50 mL/min — ABNORMAL LOW (ref >60)
Glucose, Bld: 82 mg/dL (ref 70–99)
Potassium: 3.9 mmol/L (ref 3.5–5.1)
Sodium: 139 mmol/L (ref 135–145)

## 2023-06-03 LAB — SEDIMENTATION RATE: Sed Rate: 15 mm/h (ref 0–22)

## 2023-06-03 LAB — VITAMIN B12: Vitamin B-12: 891 pg/mL (ref 180–914)

## 2023-06-03 LAB — CBC
HCT: 34.6 % — ABNORMAL LOW (ref 36.0–46.0)
Hemoglobin: 11.5 g/dL — ABNORMAL LOW (ref 12.0–15.0)
MCH: 32.9 pg (ref 26.0–34.0)
MCHC: 33.2 g/dL (ref 30.0–36.0)
MCV: 98.9 fL (ref 80.0–100.0)
Platelets: 198 10*3/uL (ref 150–400)
RBC: 3.5 MIL/uL — ABNORMAL LOW (ref 3.87–5.11)
RDW: 13.1 % (ref 11.5–15.5)
WBC: 10.3 10*3/uL (ref 4.0–10.5)
nRBC: 0 % (ref 0.0–0.2)

## 2023-06-03 LAB — TSH: TSH: 56.454 u[IU]/mL — ABNORMAL HIGH (ref 0.350–4.500)

## 2023-06-03 LAB — VITAMIN D 25 HYDROXY (VIT D DEFICIENCY, FRACTURES): Vit D, 25-Hydroxy: 42.36 ng/mL (ref 30–100)

## 2023-06-03 LAB — C-REACTIVE PROTEIN: CRP: 0.5 mg/dL (ref <1.0)

## 2023-06-03 LAB — FOLATE: Folate: >40 ng/mL (ref >5.9)

## 2023-06-03 LAB — T4, FREE: Free T4: 0.55 ng/dL — ABNORMAL LOW (ref 0.61–1.12)

## 2023-06-03 MED ORDER — LEVETIRACETAM 500 MG PO TABS
500.0000 mg | ORAL_TABLET | Freq: Two times a day (BID) | ORAL | 0 refills | Status: DC
Start: 2023-06-03 — End: 2023-10-26
  Filled 2023-06-03: qty 14, 7d supply, fill #0

## 2023-06-03 NOTE — TOC Transition Note (Addendum)
 Transition of Care [facility]) - Discharge Note   Patient Details  Name: Lisa Lang MRN: [id number] Date of Birth: 1927/07/16  [facility] CM/SW Contact:  Jonathan Neighbor, RN Phone Number: 06/03/2023, 2:48 PM   Clinical Narrative:     Pt is discharging home with home health through [employer]. [facility] will contact her for the first home visit. Information is on the AVS.  Platform attachment for walker to be delivered to the room.  Pts son to stay with her for about a week after d/c. She has friends/neighbors that can check on her after this.  Pt drives self or she has an aide that can provide some transportation. Pt manges her own medications.  Son is transporting her home.  1537: [employer] will not provide platform attachment for walker since her walker didn't come from them. CM has called [employer] and they will provide platform attachment.   Final next level of care: Home w Home Health Services Barriers to Discharge: No Barriers Identified   Patient Goals and CMS Choice   CMS Medicare.gov Compare Post Acute Care list provided to:: Patient Choice offered to / list presented to : Patient      Discharge Placement                       Discharge Plan and Services Additional resources added to the After Visit Summary for                  DME Arranged: [employer] platform DME Agency: [employer] Date DME Agency Contacted: 06/03/23   Representative spoke with at DME Agency: Harriet Limber HH Arranged: PT [facility] Agency: [facility] Date [facility] Agency Contacted: 06/03/23   Representative spoke with at [facility] Agency: Charlann Confer  Social Drivers of Health (SDOH) Interventions SDOH Screenings   Food Insecurity: No Food Insecurity (06/02/2023)  Housing: Low Risk  (06/02/2023)  Transportation Needs: No Transportation Needs (06/02/2023)  Utilities: Not At Risk (06/02/2023)  Social Connections: Moderately Integrated (06/02/2023)  Tobacco Use: Low Risk  (06/02/2023)     Readmission Risk  Interventions     No data to display

## 2023-06-03 NOTE — Evaluation (Signed)
 Physical Therapy Evaluation Patient Details Name: Lisa Lang MRN: [id number] DOB: 1927/10/12 Today's Date: 06/03/2023  History of Present Illness  Pt is 89 yo presenting to [facility] ED on 5/29 due to a fall; noted subarachnoid hemorrhage. PMH: Paroxysmal a-fib, hyperlipidemia, mod to severe MR, HFpEF.  Clinical Impression  Pt is presenting below baseline level of functioning. Currently pt has difficulty ambulating with preferred AD due to was using SPC in R hand and currently cannot. Recommending a platform walker due to significant improvement to Mod I from Cga/Min A with SPC in L hand. Due to pt current functional status, home set up and available assistance at home recommending skilled physical therapy services 3x/week in order to address strength, balance and functional mobility to decrease risk for falls, injury and re-hospitalization.           If plan is discharge home, recommend the following: A little help with walking and/or transfers;Assistance with cooking/housework;Assist for transportation;Help with stairs or ramp for entrance     Equipment Recommendations Other (comment) (platform for platform walker)     Functional Status Assessment Patient has had a recent decline in their functional status and demonstrates the ability to make significant improvements in function in a reasonable and predictable amount of time.     Precautions / Restrictions Precautions Precautions: Fall Recall of Precautions/Restrictions: Intact Required Braces or Orthoses: Sling;Splint/Cast Splint/Cast: R arm Restrictions Weight Bearing Restrictions Per Provider Order: Yes RUE Weight Bearing Per Provider Order: Non weight bearing Other Position/Activity Restrictions: assumed NWB on R arm, does not have formal order      Mobility  Bed Mobility     General bed mobility comments: up in recliner upon arrival    Transfers Overall transfer level: Needs assistance Equipment used: Right platform walker,  Straight cane Transfers: Sit to/from Stand Sit to Stand: Contact guard assist, Supervision           General transfer comment: CGA with SPC, supervision with platform walker    Ambulation/Gait Ambulation/Gait assistance: Contact guard assist, Modified independent (Device/Increase time), Supervision, Min assist Gait Distance (Feet): 200 Feet Assistive device: Right platform walker, Straight cane Gait Pattern/deviations: Step-through pattern, Drifts right/left Gait velocity: slightly decreased improved with platform walker Gait velocity interpretation: 1.31 - 2.62 ft/sec, indicative of limited community ambulator   General Gait Details: Pt was unsteady with SPC with occasional LOB and drifting to the R. Pt continued to slightly drift toward the R with platform walker but significant improvement from [facility]. Pt was Mod I with platform walker witho ccasional supervision and CGA with SPC with occasional Min A due to LOB  Stairs Stairs: Yes Stairs assistance: Supervision Stair Management: One rail Left, Step to pattern, Sideways, Forwards Number of Stairs: 1 General stair comments: pt has one step to step into her home. Pt performed step to forwad to get up the steps and side ways with education due to she only has a grab on the L hand side when coming out of the house.    Balance Overall balance assessment: Mild deficits observed, not formally tested           Pertinent Vitals/Pain Pain Assessment Pain Assessment: Faces Faces Pain Scale: Hurts a little bit Pain Location: R arm Pain Descriptors / Indicators: Grimacing Pain Intervention(s): Monitored during session, Limited activity within patient's tolerance    Home Living Family/patient expects to be discharged to:: Private residence Living Arrangements: Alone Available Help at Discharge: Friend(s) Type of Home: House Home Access: Stairs to enter  Entrance Stairs-Rails: Can reach both Entrance Stairs-Number of Steps: 1    Home Layout: One level Home Equipment: Wheelchair - manual;Tub bench;Grab bars - tub/shower;Rolling Walker (2 wheels);Cane - single point      Prior Function Prior Level of Function : Independent/Modified Independent;Driving   Mobility Comments: She did not use an AD inside the home, however used a cane outside the home. ADLs Comments: She was independent with ADLs, cooking, cleaning, and driving.     Extremity/Trunk Assessment   Upper Extremity Assessment Upper Extremity Assessment: Defer to OT evaluation    Lower Extremity Assessment Lower Extremity Assessment: Overall WFL for tasks assessed    Cervical / Trunk Assessment Cervical / Trunk Assessment: Normal  Communication   Communication Communication: No apparent difficulties    Cognition Arousal: Alert Behavior During Therapy: WFL for tasks assessed/performed         Following commands: Intact       Cueing Cueing Techniques: Verbal cues     General Comments General comments (skin integrity, edema, etc.): Son present and supportive. No signs/symptoms of cardiac/respiratory distress during session        Assessment/Plan    PT Assessment Patient needs continued PT services  PT Problem List Decreased balance;Decreased mobility       PT Treatment Interventions DME instruction;Balance training;Gait training;Stair training;Functional mobility training;Therapeutic activities;Therapeutic exercise;Patient/family education    PT Goals (Current goals can be found in the Care Plan section)  Acute Rehab PT Goals Patient Stated Goal: to return home safely PT Goal Formulation: With patient/family Time For Goal Achievement: 06/17/23 Potential to Achieve Goals: Good    Frequency Min 2X/week        AM-PAC PT "6 Clicks" Mobility  Outcome Measure Help needed turning from your back to your side while in a flat bed without using bedrails?: A Little Help needed moving from lying on your back to sitting on the side  of a flat bed without using bedrails?: A Little Help needed moving to and from a bed to a chair (including a wheelchair)?: A Little Help needed standing up from a chair using your arms (e.g., wheelchair or bedside chair)?: A Little Help needed to walk in hospital room?: A Little Help needed climbing 3-5 steps with a railing? : A Little 6 Click Score: 18    End of Session Equipment Utilized During Treatment: Gait belt Activity Tolerance: Patient tolerated treatment well Patient left: in chair;with call bell/phone within reach;with chair alarm set;with family/visitor present Nurse Communication: Mobility status PT Visit Diagnosis: Unsteadiness on feet (R26.81);Other abnormalities of gait and mobility (R26.89)    Time: 4098-1191 PT Time Calculation (min) (ACUTE ONLY): 23 min   Charges:   PT Evaluation $PT Eval Low Complexity: 1 Low PT Treatments $Therapeutic Activity: 8-22 mins PT General Charges $$ ACUTE PT VISIT: 1 Visit         Sloan Duncans, DPT, CLT  Acute Rehabilitation Services Office: [phone] (Secure chat preferred)   Lisa Lang 06/03/2023, 5:09 PM

## 2023-06-03 NOTE — Discharge Summary (Signed)
 Physician Discharge Summary   Patient: Lisa Lang MRN: [id number] DOB: [birth year]  Admit date:     06/02/2023  Discharge date: 06/03/2023  Discharge Physician: Feliciana Horn   PCP: Lysle Saunas, MD (Inactive)   Recommendations at discharge:  Please follow up with PCP in one week.  Please follow up with orthopedics in 2 weeks.  Please follow up with thyroid  panel in 2 weeks.  Please follow up neurosurgery as needed.   Discharge Diagnoses: Principal Problem:   Subarachnoid hematoma [facility])   Hospital Course:   Lisa Lang is a 89 y.o. female with medical history significant of paroxysmal atrial fibrillation on Eliquis , hyperlipidemia, mod to severe MR, HFpEF presented after a fall and was noted to have a subarachnoid hemorrhage.   He was admitted to the hospital service for further monitoring.  Assessment and Plan:  #Small SAH  in L occipital lobe Currently with no neurological deficits.  Neurosurgery was consulted by the ED.  They recommended repeat CT of the head and starting Keppra for seizure prophylaxis. Repeat CT head stable subarachnoid hemorrhage.  - Hold patient's Eliquis  for 7 to 10 days.  - discussed the above with the patient and her son at bedside.    #R distal radial fracture Discussed with orthopedics PA. Right sling applied. Recommend outpatient follow up with orthopedics in 2 weeks.    # Paroxysmal A-fib In normal sinus rhythm.  Hold Eliquis , continue amiodarone  and metoprolol  - Check LFTs and repeat TSH giving normal values 01/2022  Abnormal TSH Check free t4 and free t3.  Repeat thyroid  panel in 2 to 4 weeks.    Consultants: neurosurgery.  Procedures performed: ct HEAD without contrast.   Disposition: Home Diet recommendation:  Discharge Diet Orders (From admission, onward)     Start     Ordered   06/03/23 0000  Diet - low sodium heart healthy        06/03/23 1401   06/03/23 0000  Diet - low sodium heart healthy        06/03/23 1406            Regular diet DISCHARGE MEDICATION: Allergies as of 06/03/2023       Reactions   Lidocaine  Palpitations, Other (See Comments)   True tachycardia, dizziness, vertigo   Amoxicillin Hives, Itching   Sulfa Antibiotics Hives, Itching   Ciprofloxacin Diarrhea        Medication List     PAUSE taking these medications    Eliquis  2.5 MG Tabs tablet Wait to take this until: June 13, 2023 Generic drug: apixaban  TAKE 1 TABLET TWICE A DAY       TAKE these medications    acetaminophen  325 MG tablet Commonly known as: TYLENOL  Take 2 tablets (650 mg total) by mouth every 4 (four) hours as needed.   amiodarone  100 MG tablet Commonly known as: PACERONE  Take 1 tablet (100 mg total) by mouth daily.   atorvastatin  40 MG tablet Commonly known as: LIPITOR Take 40 mg by mouth at bedtime.   calcium  carbonate 1250 (500 Ca) MG tablet Commonly known as: OS-CAL - dosed in mg of elemental calcium  Take 1 tablet by mouth daily with breakfast.   clobetasol ointment 0.05 % Commonly known as: TEMOVATE Apply 1 Application topically 2 (two) times daily.   levETIRAcetam 500 MG tablet Commonly known as: KEPPRA Take 1 tablet (500 mg total) by mouth 2 (two) times daily for 7 days.   metoprolol  succinate 25 MG 24 hr tablet Commonly known  as: TOPROL -XL TAKE 1/2 TABLET(12.5 MG) BY MOUTH AT BEDTIME   MULTIVITAMIN PO Take 1 tablet by mouth daily.   pantoprazole 40 MG tablet Commonly known as: PROTONIX Take 40 mg by mouth daily.   Vitamin D  (Ergocalciferol ) 50 MCG (2000 UT) Caps Take 2,000 Units by mouth daily.               Durable Medical Equipment  (From admission, onward)           Start     Ordered   06/03/23 1359  For home use only DME Walker platform  Once       Comments: Right sided platform  Question:  Patient needs a walker to treat with the following condition  Answer:  Weakness   06/03/23 1359            Follow-up Information     Lysle Saunas, MD.  Schedule an appointment as soon as possible for a visit in 1 week(s).   Specialty: Internal Medicine Contact information: [address] [place] [address] [phone]         Arvil Birks, MD. Call in 2 week(s).   Specialty: Orthopedic Surgery Why: call to make appt. Contact information: [address] [place] [address] [phone]                Discharge Exam: Filed Weights   06/02/23 1344  Weight: 49 kg   General exam: Appears calm and comfortable  Respiratory system: Clear to auscultation. Respiratory effort normal. Cardiovascular system: S1 & S2 heard, RRR. No JVD,  Gastrointestinal system: Abdomen is nondistended, soft and nontender.  Central nervous system: Alert and oriented. No focal neurological deficits. Extremities: Symmetric 5 x 5 power. Skin: No rashes, lesions or ulcers Psychiatry:  Mood & affect appropriate.    Condition at discharge: fair  The results of significant diagnostics from this hospitalization (including imaging, microbiology, ancillary and laboratory) are listed below for reference.   Imaging Studies: CT HEAD WO CONTRAST ( ) Result Date: 06/02/2023 CLINICAL DATA:  Subarachnoid hemorrhage EXAM: CT HEAD WITHOUT CONTRAST TECHNIQUE: Contiguous axial images were obtained from the base of the skull through the vertex without intravenous contrast. RADIATION DOSE REDUCTION: This exam was performed according to the departmental dose-optimization program which includes automated exposure control, adjustment of the mA and/or kV according to patient size and/or use of iterative reconstruction technique. COMPARISON:  06/02/2023 at 3:24 p.m. FINDINGS: Brain: Unchanged small amount of subarachnoid blood at the left occipital lobe. Chronic ischemic white matter changes. Generalized volume loss. Vascular: Atherosclerotic calcification of the internal carotid arteries at the skull base. No abnormal hyperdensity of the  major intracranial arteries or dural venous sinuses. Skull: The visualized skull base, calvarium and extracranial soft tissues are normal. Sinuses/Orbits: No fluid levels or advanced mucosal thickening of the visualized paranasal sinuses. No mastoid or middle ear effusion. Normal orbits. Other: None. IMPRESSION: Unchanged small amount of subarachnoid blood at the left occipital lobe. Electronically Signed   By: Juanetta Nordmann M.D.   On: 06/02/2023 23:37   CT Head Wo Contrast Result Date: 06/02/2023 CLINICAL DATA:  Blunt trauma, fall. EXAM: CT HEAD WITHOUT CONTRAST CT CERVICAL SPINE WITHOUT CONTRAST TECHNIQUE: Multidetector CT imaging of the head and cervical spine was performed following the standard protocol without intravenous contrast. Multiplanar CT image reconstructions of the cervical spine were also generated. RADIATION DOSE REDUCTION: This exam was performed according to the departmental dose-optimization program which includes automated exposure control, adjustment  of the mA and/or kV according to patient size and/or use of iterative reconstruction technique. COMPARISON:  None Available. FINDINGS: CT HEAD FINDINGS Brain: Small amount of subarachnoid hemorrhage in the left occipital lobe, series 3 image 16. No midline shift or mass effect. No hemorrhage in the ventricular system. No intraparenchymal or subdural hemorrhage. Stable degree of generalized atrophy and chronic small vessel ischemia. No evidence of acute infarct. Vascular: Atherosclerosis of skullbase vasculature without hyperdense vessel or abnormal calcification. Skull: No fracture or focal lesion. Sinuses/Orbits: No acute finding. Other: Minimal right frontal scalp hematoma. Traumatic Brain Injury Risk Stratification Skull Fracture: No - Low/mBIG 1 Subdural Hematoma (SDH): No - Low Subarachnoid Hemorrhage [facility]): trace (up to 2 sulci) - Low/mBIG 1 Epidural Hematoma (EDH): No - Low/mBIG 1 Cerebral contusion, intra-axial, intraparenchymal  Hemorrhage (IPH): No Intraventricular Hemorrhage (IVH): No - Low/mBIG 1 Midline Shift > 1mm or Edema/effacement of sulci/vents: No - Low/mBIG 1 ---------------------------------------------------- CT CERVICAL SPINE FINDINGS Alignment: Grade 1 anterolisthesis of C4 on C5, unchanged from prior exam. No traumatic subluxation. Skull base and vertebrae: No acute fracture. Vertebral body heights are maintained. The dens and skull base are intact. Scattered bone islands. Soft tissues and spinal canal: No prevertebral fluid or swelling. No visible canal hematoma. Disc levels: Stable degenerative disc disease most prominently affecting C5-C6 stable. Facet multilevel hypertrophy. Upper chest: Chronic right greater than left apical pleuroparenchymal scarring. Other: Carotid calcifications IMPRESSION: 1. Small amount of subarachnoid hemorrhage in the left occipital lobe. 2. Minimal right frontal scalp hematoma. 3. No acute fracture or subluxation of the cervical spine. Critical Value/emergent results were called by telephone at the time of interpretation on 06/02/2023 at 4:13 pm to provider [facility] , who verbally acknowledged these results. Electronically Signed   By: Chadwick Colonel M.D.   On: 06/02/2023 16:13   CT Cervical Spine Wo Contrast Result Date: 06/02/2023 CLINICAL DATA:  Blunt trauma, fall. EXAM: CT HEAD WITHOUT CONTRAST CT CERVICAL SPINE WITHOUT CONTRAST TECHNIQUE: Multidetector CT imaging of the head and cervical spine was performed following the standard protocol without intravenous contrast. Multiplanar CT image reconstructions of the cervical spine were also generated. RADIATION DOSE REDUCTION: This exam was performed according to the departmental dose-optimization program which includes automated exposure control, adjustment of the mA and/or kV according to patient size and/or use of iterative reconstruction technique. COMPARISON:  None Available. FINDINGS: CT HEAD FINDINGS Brain: Small amount of  subarachnoid hemorrhage in the left occipital lobe, series 3 image 16. No midline shift or mass effect. No hemorrhage in the ventricular system. No intraparenchymal or subdural hemorrhage. Stable degree of generalized atrophy and chronic small vessel ischemia. No evidence of acute infarct. Vascular: Atherosclerosis of skullbase vasculature without hyperdense vessel or abnormal calcification. Skull: No fracture or focal lesion. Sinuses/Orbits: No acute finding. Other: Minimal right frontal scalp hematoma. Traumatic Brain Injury Risk Stratification Skull Fracture: No - Low/mBIG 1 Subdural Hematoma (SDH): No - Low Subarachnoid Hemorrhage [facility]): trace (up to 2 sulci) - Low/mBIG 1 Epidural Hematoma (EDH): No - Low/mBIG 1 Cerebral contusion, intra-axial, intraparenchymal Hemorrhage (IPH): No Intraventricular Hemorrhage (IVH): No - Low/mBIG 1 Midline Shift > 1mm or Edema/effacement of sulci/vents: No - Low/mBIG 1 ---------------------------------------------------- CT CERVICAL SPINE FINDINGS Alignment: Grade 1 anterolisthesis of C4 on C5, unchanged from prior exam. No traumatic subluxation. Skull base and vertebrae: No acute fracture. Vertebral body heights are maintained. The dens and skull base are intact. Scattered bone islands. Soft tissues and spinal canal: No prevertebral fluid or swelling. No visible canal  hematoma. Disc levels: Stable degenerative disc disease most prominently affecting C5-C6 stable. Facet multilevel hypertrophy. Upper chest: Chronic right greater than left apical pleuroparenchymal scarring. Other: Carotid calcifications IMPRESSION: 1. Small amount of subarachnoid hemorrhage in the left occipital lobe. 2. Minimal right frontal scalp hematoma. 3. No acute fracture or subluxation of the cervical spine. Critical Value/emergent results were called by telephone at the time of interpretation on 06/02/2023 at 4:13 pm to provider [facility] , who verbally acknowledged these results. Electronically Signed    By: Chadwick Colonel M.D.   On: 06/02/2023 16:13   DG Wrist Complete Right Result Date: 06/02/2023 CLINICAL DATA:  Right wrist pain after fall. EXAM: RIGHT WRIST - COMPLETE 3+ VIEW COMPARISON:  None Available. FINDINGS: Minimally displaced impaction fracture of the distal radial metaphysis. No convincing intra-articular involvement. The distal radioulnar joint is congruent. 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 The fourth digit is held in flexion at the proximal interphalangeal joint on all views. Soft tissue edema seen at the fracture site. IMPRESSION: 1. Minimally displaced impaction fracture of the distal radial metaphysis. 2. The fourth digit is held in flexion at the proximal interphalangeal joint on all views. This is of unknown acuity, clinical correlation is advised. Electronically Signed   By: Chadwick Colonel M.D.   On: 06/02/2023 16:03    Microbiology: Results for orders placed or performed during the hospital encounter of 11/20/21  Urine Culture     Status: Abnormal   Collection Time: 11/20/21  8:24 PM   Specimen: Urine, Clean Catch  Result Value Ref Range Status   Specimen Description   Final    URINE, CLEAN CATCH Performed at [facility], [address] [place], [place] [address]    Special Requests   Final    NONE Performed at [facility], [address] [place], [place] [address]    Culture (A)  Final    20,000 COLONIES/mL ESCHERICHIA COLI CRITICAL RESULT CALLED TO, READ BACK BY AND VERIFIED WITH: PHARMD G GADHIA [id number] AT 728 AM BY CM Performed at [facility], [address] [place], [place] [address]    Report Status 11/23/2021 FINAL  Final   Organism ID, Bacteria ESCHERICHIA COLI (A)  Final      Susceptibility   Escherichia coli - MIC*    AMPICILLIN <=2 SENSITIVE Sensitive     CEFAZOLIN <=4 SENSITIVE Sensitive     CEFEPIME <=0.12 SENSITIVE Sensitive     CEFTRIAXONE  <=0.25 SENSITIVE Sensitive      CIPROFLOXACIN <=0.25 SENSITIVE Sensitive     GENTAMICIN <=1 SENSITIVE Sensitive     IMIPENEM <=0.25 SENSITIVE Sensitive     NITROFURANTOIN <=16 SENSITIVE Sensitive     TRIMETH/SULFA <=20 SENSITIVE Sensitive     AMPICILLIN/SULBACTAM <=2 SENSITIVE Sensitive     PIP/TAZO <=4 SENSITIVE Sensitive     * 20,000 COLONIES/mL ESCHERICHIA COLI  Surgical PCR screen     Status: None   Collection Time: 11/23/21  5:49 AM   Specimen: Nasal Mucosa; Nasal Swab  Result Value Ref Range Status   MRSA, PCR NEGATIVE NEGATIVE Final   Staphylococcus aureus NEGATIVE NEGATIVE Final    Comment: (NOTE) The Xpert SA Assay (FDA approved for NASAL specimens in patients 60 years of age and older), is one component of a comprehensive surveillance program. It is not intended to diagnose infection nor to guide or monitor treatment. Performed at [facility], [address] [place], [place] [address]  Labs: CBC: Recent Labs  Lab 06/02/23 1432 06/03/23 0744  WBC 8.4 10.3  NEUTROABS 6.6  --   HGB 13.4 11.5*  HCT 42.3 34.6*  MCV 102.7* 98.9  PLT 246 198   Basic Metabolic Panel: Recent Labs  Lab 06/02/23 1432 06/03/23 0744  NA 138 139  K 4.1 3.9  CL 105 105  CO2 28 25  GLUCOSE 116* 82  BUN 27* 18  CREATININE 1.20* 1.05*  CALCIUM  8.9 8.8*   Liver Function Tests: Recent Labs  Lab 06/03/23 0744  AST 41  ALT 38  ALKPHOS 88  BILITOT 0.9  PROT 5.5*  ALBUMIN 2.9*   CBG: No results for input(s): "GLUCAP" in the last 168 hours.  Discharge time spent: 39 minutes.   Signed: Feliciana Horn, MD [facility] 06/03/2023

## 2023-06-03 NOTE — Care Management Obs Status (Signed)
 MEDICARE OBSERVATION STATUS NOTIFICATION   Patient Details  Name: Lisa Lang MRN: [id number] Date of Birth: 1927-12-15   Medicare Observation Status Notification Given:  Yes  Moon/Obs letter sign and copy given  Wynonia Hedges 06/03/2023, 12:28 PM

## 2023-06-03 NOTE — Progress Notes (Signed)
 Patient able to ambulate to bathroom with assistance. Clean urine sample obtained and sent.

## 2023-06-03 NOTE — Progress Notes (Signed)
 DISCHARGE NOTE HOME KALIAH HADDAWAY to be discharged Home per MD order. Discussed prescriptions and follow up appointments with the patient. Prescriptions given to patient; medication list explained in detail. Patient verbalized understanding. TOC meds picked up  Skin clean, dry and intact without evidence of skin break down, no evidence of skin tears noted. IV catheter discontinued intact. Site without signs and symptoms of complications. Dressing and pressure applied. Pt denies pain at the site currently. No complaints noted.  See LDA for incisions and wounds at discharge, head, right arm etc  An After Visit Summary (AVS) was printed and given to the patient.  Patient taken to discharge lounge to wait for equipment. Patient will be escorted via wheelchair, and discharged home via private auto.  Tonda Francisco, RN

## 2023-06-03 NOTE — Evaluation (Signed)
 Occupational Therapy Evaluation Patient Details Name: Lisa Lang MRN: [id number] DOB: 1927-09-21 Today's Date: 06/03/2023   History of Present Illness   Pt is 89 yo presenting to [facility] ED on 5/29 due to a fall; noted subarachnoid hemorrhage. PMH: Paroxysmal a-fib, hyperlipidemia, mod to severe MR, HFpEF.     Clinical Impressions Prior to this admission, patient living alone, with a caretaker that comes and checks on her and assist with IADLs as needed. Patient would walk with a cane in the community, but otherwise extremely independent and still drove. Currently, patient is CGA due to assummed NWB status with RUE due to fracture, but no cognitive deficits or vision impairments noted. OT will follow acutely, but patient does not need OT follow up at discharge.      If plan is discharge home, recommend the following:   A little help with walking and/or transfers;A little help with bathing/dressing/bathroom;Assist for transportation;Help with stairs or ramp for entrance;Assistance with cooking/housework (initially)     Functional Status Assessment   Patient has had a recent decline in their functional status and demonstrates the ability to make significant improvements in function in a reasonable and predictable amount of time.     Equipment Recommendations   None recommended by OT     Recommendations for Other Services         Precautions/Restrictions   Precautions Precautions: Fall Recall of Precautions/Restrictions: Intact Required Braces or Orthoses: Sling;Splint/Cast Splint/Cast: R arm Restrictions Weight Bearing Restrictions Per Provider Order: Yes RUE Weight Bearing Per Provider Order: Non weight bearing Other Position/Activity Restrictions: assumed NWB on R arm, does not have formal order     Mobility Bed Mobility               General bed mobility comments: up in recliner upon arrival    Transfers Overall transfer level: Needs  assistance Equipment used: 1 person hand held assist Transfers: Sit to/from Stand Sit to Stand: Contact guard assist           General transfer comment: CGA to steady, OT acting as cane on R side      Balance Overall balance assessment: Mild deficits observed, not formally tested                                         ADL either performed or assessed with clinical judgement   ADL Overall ADL's : Needs assistance/impaired Eating/Feeding: Set up;Sitting   Grooming: Set up;Sitting   Upper Body Bathing: Contact guard assist;Sitting   Lower Body Bathing: Minimal assistance;Sit to/from stand;Sitting/lateral leans   Upper Body Dressing : Contact guard assist;Sitting   Lower Body Dressing: Minimal assistance;Sitting/lateral leans;Sit to/from stand   Toilet Transfer: Contact guard assist;Ambulation   Toileting- Clothing Manipulation and Hygiene: Minimal assistance;Sitting/lateral lean;Sit to/from stand       Functional mobility during ADLs: Contact guard assist;Cueing for safety;Cueing for sequencing General ADL Comments: Prior to this admission, patient living alone, with a caretaker that comes and checks on her and assist with IADLs as needed. Patient would walk with a cane in the community, but otherwise extremely independent and still drove. Currently, patient is CGA due to assummed NWB status with RUE due to fracture, but no cognitive deficits or vision impairments noted. OT will follow acutely, but patient does not need OT follow up at discharge.     Vision Baseline Vision/History: 1 Wears glasses Ability  to See in Adequate Light: 0 Adequate Patient Visual Report: No change from baseline Vision Assessment?: Yes Eye Alignment: Within Functional Limits Ocular Range of Motion: Within Functional Limits Alignment/Gaze Preference: Within Defined Limits Tracking/Visual Pursuits: Able to track stimulus in all quads without difficulty Saccades: Within  functional limits Convergence: Within functional limits Visual Fields: No apparent deficits     Perception Perception: Within Functional Limits       Praxis Praxis: WFL       Pertinent Vitals/Pain Pain Assessment Pain Assessment: Faces Faces Pain Scale: Hurts a little bit Pain Location: R arm Pain Descriptors / Indicators: Grimacing Pain Intervention(s): Limited activity within patient's tolerance, Monitored during session, Premedicated before session     Extremity/Trunk Assessment Upper Extremity Assessment Upper Extremity Assessment: RUE deficits/detail;Right hand dominant RUE Deficits / Details: R arm in cast, assuming NWB, sensation intact and able to wiggle fingers, no impairments at elbow or shoulder RUE Sensation: WNL RUE Coordination: decreased fine motor;decreased gross motor   Lower Extremity Assessment Lower Extremity Assessment: Defer to PT evaluation   Cervical / Trunk Assessment Cervical / Trunk Assessment: Normal   Communication Communication Communication: No apparent difficulties   Cognition Arousal: Alert Behavior During Therapy: WFL for tasks assessed/performed Cognition: No apparent impairments             OT - Cognition Comments: Able to count backwards from 20, and dual task without issue                 Following commands: Intact       Cueing  General Comments   Cueing Techniques: Verbal cues  VSS   Exercises     Shoulder Instructions      Home Living Family/patient expects to be discharged to:: Private residence Living Arrangements: Alone Available Help at Discharge: Friend(s) Type of Home: House Home Access: Stairs to enter Secretary/administrator of Steps: 1 Entrance Stairs-Rails: Can reach both Home Layout: One level     Bathroom Shower/Tub: Chief Strategy Officer: Handicapped height     Home Equipment: Wheelchair - manual;Tub bench;Grab bars - tub/shower;Rolling Environmental consultant (2 wheels);Cane - single  point          Prior Functioning/Environment Prior Level of Function : Independent/Modified Independent;Driving             Mobility Comments: She did not use an AD inside the home, however used a cane outside the home. ADLs Comments: She was independent with ADLs, cooking, cleaning, and driving.    OT Problem List: Impaired balance (sitting and/or standing);Pain;Impaired UE functional use   OT Treatment/Interventions: Self-care/ADL training;Therapeutic exercise;Energy conservation;DME and/or AE instruction;Manual therapy;Patient/family education;Balance training      OT Goals(Current goals can be found in the care plan section)   Acute Rehab OT Goals Patient Stated Goal: to get back home OT Goal Formulation: With patient/family Time For Goal Achievement: 06/17/23 Potential to Achieve Goals: Good   OT Frequency:  Min 2X/week    Co-evaluation              AM-PAC OT "6 Clicks" Daily Activity     Outcome Measure Help from another person eating meals?: A Little Help from another person taking care of personal grooming?: A Little Help from another person toileting, which includes using toliet, bedpan, or urinal?: A Little Help from another person bathing (including washing, rinsing, drying)?: A Little Help from another person to put on and taking off regular upper body clothing?: A Little Help from another person to  put on and taking off regular lower body clothing?: A Little 6 Click Score: 18   End of Session Equipment Utilized During Treatment: Gait belt Nurse Communication: Mobility status  Activity Tolerance: Patient tolerated treatment well Patient left: in chair;with call bell/phone within reach;with family/visitor present  OT Visit Diagnosis: Unsteadiness on feet (R26.81);Pain Pain - Right/Left: Right Pain - part of body: Arm                Time: 1023-1050 OT Time Calculation (min): 27 min Charges:  OT General Charges $OT Visit: 1 Visit OT  Evaluation $OT Eval Moderate Complexity: 1 Mod OT Treatments $Self Care/Home Management : 8-22 mins  Mollie Anger E. Ellwood Steidle, OTR/L Acute Rehabilitation Services [phone]   Vincent Greek 06/03/2023, 12:25 PM

## 2023-06-03 NOTE — TOC CAGE-AID Note (Signed)
 Transition of Care [facility]) - CAGE-AID Screening   Patient Details  Name: NATAJAH DERDERIAN MRN: [id number] Date of Birth: [birth year]  Transition of Care (TOC) CM/SW Contact:    Tavarious Freel E Adaley Kiene, LCSW Phone Number: 06/03/2023, 9:22 AM   Clinical Narrative:    CAGE-AID Screening:    Have You Ever Felt You Ought to Cut Down on Your Drinking or Drug Use?: No Have People Annoyed You By [employer] Your Drinking Or Drug Use?: No Have You Felt Bad Or Guilty About Your Drinking Or Drug Use?: No Have You Ever Had a Drink or Used Drugs First Thing In The Morning to Steady Your Nerves or to Get Rid of a Hangover?: No CAGE-AID Score: 0  Substance Abuse Education Offered: No

## 2023-06-04 DIAGNOSIS — Z7901 Long term (current) use of anticoagulants: Secondary | ICD-10-CM | POA: Diagnosis not present

## 2023-06-04 DIAGNOSIS — Z9842 Cataract extraction status, left eye: Secondary | ICD-10-CM | POA: Diagnosis not present

## 2023-06-04 DIAGNOSIS — E785 Hyperlipidemia, unspecified: Secondary | ICD-10-CM | POA: Diagnosis not present

## 2023-06-04 DIAGNOSIS — S8264XD Nondisplaced fracture of lateral malleolus of right fibula, subsequent encounter for closed fracture with routine healing: Secondary | ICD-10-CM | POA: Diagnosis not present

## 2023-06-04 DIAGNOSIS — I5032 Chronic diastolic (congestive) heart failure: Secondary | ICD-10-CM | POA: Diagnosis not present

## 2023-06-04 DIAGNOSIS — N1832 Chronic kidney disease, stage 3b: Secondary | ICD-10-CM | POA: Diagnosis not present

## 2023-06-04 DIAGNOSIS — I341 Nonrheumatic mitral (valve) prolapse: Secondary | ICD-10-CM | POA: Diagnosis not present

## 2023-06-04 DIAGNOSIS — Z9181 History of falling: Secondary | ICD-10-CM | POA: Diagnosis not present

## 2023-06-04 DIAGNOSIS — Z9841 Cataract extraction status, right eye: Secondary | ICD-10-CM | POA: Diagnosis not present

## 2023-06-04 DIAGNOSIS — Z556 Problems related to health literacy: Secondary | ICD-10-CM | POA: Diagnosis not present

## 2023-06-04 DIAGNOSIS — S066X0D Traumatic subarachnoid hemorrhage without loss of consciousness, subsequent encounter: Secondary | ICD-10-CM | POA: Diagnosis not present

## 2023-06-04 DIAGNOSIS — I480 Paroxysmal atrial fibrillation: Secondary | ICD-10-CM | POA: Diagnosis not present

## 2023-06-04 DIAGNOSIS — M1990 Unspecified osteoarthritis, unspecified site: Secondary | ICD-10-CM | POA: Diagnosis not present

## 2023-06-04 DIAGNOSIS — S52591D Other fractures of lower end of right radius, subsequent encounter for closed fracture with routine healing: Secondary | ICD-10-CM | POA: Diagnosis not present

## 2023-06-04 LAB — T3, FREE: T3, Free: 2.1 pg/mL (ref 2.0–4.4)

## 2023-06-07 DIAGNOSIS — I480 Paroxysmal atrial fibrillation: Secondary | ICD-10-CM | POA: Diagnosis not present

## 2023-06-07 DIAGNOSIS — S066X0D Traumatic subarachnoid hemorrhage without loss of consciousness, subsequent encounter: Secondary | ICD-10-CM | POA: Diagnosis not present

## 2023-06-07 DIAGNOSIS — S52591D Other fractures of lower end of right radius, subsequent encounter for closed fracture with routine healing: Secondary | ICD-10-CM | POA: Diagnosis not present

## 2023-06-07 DIAGNOSIS — I5032 Chronic diastolic (congestive) heart failure: Secondary | ICD-10-CM | POA: Diagnosis not present

## 2023-06-07 DIAGNOSIS — S8264XD Nondisplaced fracture of lateral malleolus of right fibula, subsequent encounter for closed fracture with routine healing: Secondary | ICD-10-CM | POA: Diagnosis not present

## 2023-06-07 DIAGNOSIS — N1832 Chronic kidney disease, stage 3b: Secondary | ICD-10-CM | POA: Diagnosis not present

## 2023-06-09 DIAGNOSIS — I483 Typical atrial flutter: Secondary | ICD-10-CM | POA: Diagnosis not present

## 2023-06-09 DIAGNOSIS — M25531 Pain in right wrist: Secondary | ICD-10-CM | POA: Diagnosis not present

## 2023-06-09 DIAGNOSIS — I609 Nontraumatic subarachnoid hemorrhage, unspecified: Secondary | ICD-10-CM | POA: Diagnosis not present

## 2023-06-09 DIAGNOSIS — E039 Hypothyroidism, unspecified: Secondary | ICD-10-CM | POA: Diagnosis not present

## 2023-06-13 ENCOUNTER — Other Ambulatory Visit

## 2023-06-13 DIAGNOSIS — S52591D Other fractures of lower end of right radius, subsequent encounter for closed fracture with routine healing: Secondary | ICD-10-CM | POA: Diagnosis not present

## 2023-06-13 DIAGNOSIS — S8264XD Nondisplaced fracture of lateral malleolus of right fibula, subsequent encounter for closed fracture with routine healing: Secondary | ICD-10-CM | POA: Diagnosis not present

## 2023-06-13 DIAGNOSIS — S066X0D Traumatic subarachnoid hemorrhage without loss of consciousness, subsequent encounter: Secondary | ICD-10-CM | POA: Diagnosis not present

## 2023-06-13 DIAGNOSIS — I480 Paroxysmal atrial fibrillation: Secondary | ICD-10-CM | POA: Diagnosis not present

## 2023-06-13 DIAGNOSIS — I5032 Chronic diastolic (congestive) heart failure: Secondary | ICD-10-CM | POA: Diagnosis not present

## 2023-06-13 DIAGNOSIS — N1832 Chronic kidney disease, stage 3b: Secondary | ICD-10-CM | POA: Diagnosis not present

## 2023-06-14 DIAGNOSIS — L988 Other specified disorders of the skin and subcutaneous tissue: Secondary | ICD-10-CM | POA: Diagnosis not present

## 2023-06-15 DIAGNOSIS — N1832 Chronic kidney disease, stage 3b: Secondary | ICD-10-CM | POA: Diagnosis not present

## 2023-06-15 DIAGNOSIS — I480 Paroxysmal atrial fibrillation: Secondary | ICD-10-CM | POA: Diagnosis not present

## 2023-06-15 DIAGNOSIS — S8264XD Nondisplaced fracture of lateral malleolus of right fibula, subsequent encounter for closed fracture with routine healing: Secondary | ICD-10-CM | POA: Diagnosis not present

## 2023-06-15 DIAGNOSIS — S52591D Other fractures of lower end of right radius, subsequent encounter for closed fracture with routine healing: Secondary | ICD-10-CM | POA: Diagnosis not present

## 2023-06-15 DIAGNOSIS — I5032 Chronic diastolic (congestive) heart failure: Secondary | ICD-10-CM | POA: Diagnosis not present

## 2023-06-15 DIAGNOSIS — S066X0D Traumatic subarachnoid hemorrhage without loss of consciousness, subsequent encounter: Secondary | ICD-10-CM | POA: Diagnosis not present

## 2023-06-17 DIAGNOSIS — S066X0D Traumatic subarachnoid hemorrhage without loss of consciousness, subsequent encounter: Secondary | ICD-10-CM | POA: Diagnosis not present

## 2023-06-17 DIAGNOSIS — I5032 Chronic diastolic (congestive) heart failure: Secondary | ICD-10-CM | POA: Diagnosis not present

## 2023-06-17 DIAGNOSIS — N1832 Chronic kidney disease, stage 3b: Secondary | ICD-10-CM | POA: Diagnosis not present

## 2023-06-17 DIAGNOSIS — S52591D Other fractures of lower end of right radius, subsequent encounter for closed fracture with routine healing: Secondary | ICD-10-CM | POA: Diagnosis not present

## 2023-06-17 DIAGNOSIS — I480 Paroxysmal atrial fibrillation: Secondary | ICD-10-CM | POA: Diagnosis not present

## 2023-06-17 DIAGNOSIS — S8264XD Nondisplaced fracture of lateral malleolus of right fibula, subsequent encounter for closed fracture with routine healing: Secondary | ICD-10-CM | POA: Diagnosis not present

## 2023-06-20 DIAGNOSIS — I480 Paroxysmal atrial fibrillation: Secondary | ICD-10-CM | POA: Diagnosis not present

## 2023-06-20 DIAGNOSIS — N1832 Chronic kidney disease, stage 3b: Secondary | ICD-10-CM | POA: Diagnosis not present

## 2023-06-20 DIAGNOSIS — S066X0D Traumatic subarachnoid hemorrhage without loss of consciousness, subsequent encounter: Secondary | ICD-10-CM | POA: Diagnosis not present

## 2023-06-20 DIAGNOSIS — S8264XD Nondisplaced fracture of lateral malleolus of right fibula, subsequent encounter for closed fracture with routine healing: Secondary | ICD-10-CM | POA: Diagnosis not present

## 2023-06-20 DIAGNOSIS — I5032 Chronic diastolic (congestive) heart failure: Secondary | ICD-10-CM | POA: Diagnosis not present

## 2023-06-20 DIAGNOSIS — S52591D Other fractures of lower end of right radius, subsequent encounter for closed fracture with routine healing: Secondary | ICD-10-CM | POA: Diagnosis not present

## 2023-06-22 DIAGNOSIS — S066X0D Traumatic subarachnoid hemorrhage without loss of consciousness, subsequent encounter: Secondary | ICD-10-CM | POA: Diagnosis not present

## 2023-06-22 DIAGNOSIS — I480 Paroxysmal atrial fibrillation: Secondary | ICD-10-CM | POA: Diagnosis not present

## 2023-06-22 DIAGNOSIS — N1832 Chronic kidney disease, stage 3b: Secondary | ICD-10-CM | POA: Diagnosis not present

## 2023-06-22 DIAGNOSIS — S8264XD Nondisplaced fracture of lateral malleolus of right fibula, subsequent encounter for closed fracture with routine healing: Secondary | ICD-10-CM | POA: Diagnosis not present

## 2023-06-22 DIAGNOSIS — I5032 Chronic diastolic (congestive) heart failure: Secondary | ICD-10-CM | POA: Diagnosis not present

## 2023-06-22 DIAGNOSIS — S52591D Other fractures of lower end of right radius, subsequent encounter for closed fracture with routine healing: Secondary | ICD-10-CM | POA: Diagnosis not present

## 2023-06-27 DIAGNOSIS — S8264XD Nondisplaced fracture of lateral malleolus of right fibula, subsequent encounter for closed fracture with routine healing: Secondary | ICD-10-CM | POA: Diagnosis not present

## 2023-06-27 DIAGNOSIS — S066X0D Traumatic subarachnoid hemorrhage without loss of consciousness, subsequent encounter: Secondary | ICD-10-CM | POA: Diagnosis not present

## 2023-06-27 DIAGNOSIS — S52591D Other fractures of lower end of right radius, subsequent encounter for closed fracture with routine healing: Secondary | ICD-10-CM | POA: Diagnosis not present

## 2023-06-27 DIAGNOSIS — I480 Paroxysmal atrial fibrillation: Secondary | ICD-10-CM | POA: Diagnosis not present

## 2023-06-27 DIAGNOSIS — N1832 Chronic kidney disease, stage 3b: Secondary | ICD-10-CM | POA: Diagnosis not present

## 2023-06-27 DIAGNOSIS — I5032 Chronic diastolic (congestive) heart failure: Secondary | ICD-10-CM | POA: Diagnosis not present

## 2023-06-28 DIAGNOSIS — N1832 Chronic kidney disease, stage 3b: Secondary | ICD-10-CM | POA: Diagnosis not present

## 2023-06-28 DIAGNOSIS — S066X0D Traumatic subarachnoid hemorrhage without loss of consciousness, subsequent encounter: Secondary | ICD-10-CM | POA: Diagnosis not present

## 2023-06-28 DIAGNOSIS — I480 Paroxysmal atrial fibrillation: Secondary | ICD-10-CM | POA: Diagnosis not present

## 2023-06-28 DIAGNOSIS — S8264XD Nondisplaced fracture of lateral malleolus of right fibula, subsequent encounter for closed fracture with routine healing: Secondary | ICD-10-CM | POA: Diagnosis not present

## 2023-06-28 DIAGNOSIS — I5032 Chronic diastolic (congestive) heart failure: Secondary | ICD-10-CM | POA: Diagnosis not present

## 2023-06-28 DIAGNOSIS — S52591D Other fractures of lower end of right radius, subsequent encounter for closed fracture with routine healing: Secondary | ICD-10-CM | POA: Diagnosis not present

## 2023-06-29 DIAGNOSIS — S52591D Other fractures of lower end of right radius, subsequent encounter for closed fracture with routine healing: Secondary | ICD-10-CM | POA: Diagnosis not present

## 2023-06-29 DIAGNOSIS — N1832 Chronic kidney disease, stage 3b: Secondary | ICD-10-CM | POA: Diagnosis not present

## 2023-06-29 DIAGNOSIS — I5032 Chronic diastolic (congestive) heart failure: Secondary | ICD-10-CM | POA: Diagnosis not present

## 2023-06-29 DIAGNOSIS — S066X0D Traumatic subarachnoid hemorrhage without loss of consciousness, subsequent encounter: Secondary | ICD-10-CM | POA: Diagnosis not present

## 2023-06-29 DIAGNOSIS — S8264XD Nondisplaced fracture of lateral malleolus of right fibula, subsequent encounter for closed fracture with routine healing: Secondary | ICD-10-CM | POA: Diagnosis not present

## 2023-06-29 DIAGNOSIS — I480 Paroxysmal atrial fibrillation: Secondary | ICD-10-CM | POA: Diagnosis not present

## 2023-06-30 DIAGNOSIS — M25531 Pain in right wrist: Secondary | ICD-10-CM | POA: Diagnosis not present

## 2023-07-07 DIAGNOSIS — M25631 Stiffness of right wrist, not elsewhere classified: Secondary | ICD-10-CM | POA: Diagnosis not present

## 2023-07-07 DIAGNOSIS — M25531 Pain in right wrist: Secondary | ICD-10-CM | POA: Diagnosis not present

## 2023-07-14 DIAGNOSIS — M25631 Stiffness of right wrist, not elsewhere classified: Secondary | ICD-10-CM | POA: Diagnosis not present

## 2023-07-19 DIAGNOSIS — L988 Other specified disorders of the skin and subcutaneous tissue: Secondary | ICD-10-CM | POA: Diagnosis not present

## 2023-07-19 DIAGNOSIS — L821 Other seborrheic keratosis: Secondary | ICD-10-CM | POA: Diagnosis not present

## 2023-07-26 DIAGNOSIS — M25631 Stiffness of right wrist, not elsewhere classified: Secondary | ICD-10-CM | POA: Diagnosis not present

## 2023-07-28 DIAGNOSIS — M25531 Pain in right wrist: Secondary | ICD-10-CM | POA: Diagnosis not present

## 2023-08-02 DIAGNOSIS — M25631 Stiffness of right wrist, not elsewhere classified: Secondary | ICD-10-CM | POA: Diagnosis not present

## 2023-08-04 ENCOUNTER — Other Ambulatory Visit: Payer: Self-pay

## 2023-08-04 MED ORDER — AMIODARONE HCL 100 MG PO TABS: 100.0000 mg | ORAL_TABLET | Freq: Every day | ORAL | 2 refills | Status: DC

## 2023-08-11 DIAGNOSIS — L988 Other specified disorders of the skin and subcutaneous tissue: Secondary | ICD-10-CM | POA: Diagnosis not present

## 2023-08-22 ENCOUNTER — Other Ambulatory Visit: Payer: Self-pay | Admitting: Cardiology

## 2023-08-22 DIAGNOSIS — E039 Hypothyroidism, unspecified: Secondary | ICD-10-CM | POA: Diagnosis not present

## 2023-08-24 MED ORDER — AMIODARONE HCL 100 MG PO TABS: 100.0000 mg | ORAL_TABLET | Freq: Every day | ORAL | 2 refills | Status: DC

## 2023-08-25 DIAGNOSIS — M25531 Pain in right wrist: Secondary | ICD-10-CM | POA: Diagnosis not present

## 2023-09-19 DIAGNOSIS — L988 Other specified disorders of the skin and subcutaneous tissue: Secondary | ICD-10-CM | POA: Diagnosis not present

## 2023-09-22 DIAGNOSIS — K5792 Diverticulitis of intestine, part unspecified, without perforation or abscess without bleeding: Secondary | ICD-10-CM | POA: Diagnosis not present

## 2023-10-04 ENCOUNTER — Emergency Department (HOSPITAL_COMMUNITY)

## 2023-10-04 ENCOUNTER — Encounter (HOSPITAL_COMMUNITY): Payer: Self-pay

## 2023-10-04 ENCOUNTER — Emergency Department (HOSPITAL_COMMUNITY): Admission: EM | Admit: 2023-10-04 | Discharge: 2023-10-04 | Disposition: A | Discharge status: Home / Self Care

## 2023-10-04 ENCOUNTER — Other Ambulatory Visit: Payer: Self-pay

## 2023-10-04 DIAGNOSIS — R0602 Shortness of breath: Secondary | ICD-10-CM | POA: Diagnosis not present

## 2023-10-04 DIAGNOSIS — I517 Cardiomegaly: Secondary | ICD-10-CM | POA: Diagnosis not present

## 2023-10-04 DIAGNOSIS — J849 Interstitial pulmonary disease, unspecified: Secondary | ICD-10-CM | POA: Diagnosis not present

## 2023-10-04 DIAGNOSIS — R42 Dizziness and giddiness: Secondary | ICD-10-CM | POA: Diagnosis not present

## 2023-10-04 DIAGNOSIS — R000 Tachycardia, unspecified: Secondary | ICD-10-CM | POA: Diagnosis not present

## 2023-10-04 DIAGNOSIS — Z7901 Long term (current) use of anticoagulants: Secondary | ICD-10-CM | POA: Insufficient documentation

## 2023-10-04 DIAGNOSIS — R457 State of emotional shock and stress, unspecified: Secondary | ICD-10-CM | POA: Diagnosis not present

## 2023-10-04 DIAGNOSIS — I700 Atherosclerosis of aorta: Secondary | ICD-10-CM | POA: Diagnosis not present

## 2023-10-04 DIAGNOSIS — I4510 Unspecified right bundle-branch block: Secondary | ICD-10-CM | POA: Diagnosis not present

## 2023-10-04 LAB — CBC
HCT: 42.3 % (ref 36.0–46.0)
Hemoglobin: 13.3 g/dL (ref 12.0–15.0)
MCH: 31.7 pg (ref 26.0–34.0)
MCHC: 31.4 g/dL (ref 30.0–36.0)
MCV: 101 fL — ABNORMAL HIGH (ref 80.0–100.0)
Platelets: 307 K/uL (ref 150–400)
RBC: 4.19 MIL/uL (ref 3.87–5.11)
RDW: 13.6 % (ref 11.5–15.5)
WBC: 10.3 K/uL (ref 4.0–10.5)
nRBC: 0 % (ref 0.0–0.2)

## 2023-10-04 LAB — BASIC METABOLIC PANEL WITH GFR
Anion gap: 13 (ref 5–15)
BUN: 16 mg/dL (ref 8–23)
CO2: 23 mmol/L (ref 22–32)
Calcium: 8.7 mg/dL — ABNORMAL LOW (ref 8.9–10.3)
Chloride: 105 mmol/L (ref 98–111)
Creatinine, Ser: 1.13 mg/dL — ABNORMAL HIGH (ref 0.44–1.00)
GFR, Estimated: 45 mL/min — ABNORMAL LOW (ref >60)
Glucose, Bld: 116 mg/dL — ABNORMAL HIGH (ref 70–99)
Potassium: 5 mmol/L (ref 3.5–5.1)
Sodium: 141 mmol/L (ref 135–145)

## 2023-10-04 LAB — URINALYSIS, ROUTINE W REFLEX MICROSCOPIC
Bilirubin Urine: NEGATIVE
Glucose, UA: NEGATIVE mg/dL
Hgb urine dipstick: NEGATIVE
Ketones, ur: NEGATIVE mg/dL
Nitrite: NEGATIVE
Protein, ur: NEGATIVE mg/dL
Specific Gravity, Urine: 1.01 (ref 1.005–1.030)
pH: 6 (ref 5.0–8.0)

## 2023-10-04 LAB — TROPONIN I (HIGH SENSITIVITY)
Troponin I (High Sensitivity): 9 ng/L (ref <18)
Troponin I (High Sensitivity): 16 ng/L (ref <18)

## 2023-10-04 LAB — PROTIME-INR
INR: 1.3 — ABNORMAL HIGH (ref 0.8–1.2)
Prothrombin Time: 16.7 s — ABNORMAL HIGH (ref 11.4–15.2)

## 2023-10-04 MED ORDER — METOPROLOL SUCCINATE ER 25 MG PO TB24
12.5000 mg | ORAL_TABLET | Freq: Once | ORAL | Status: AC
  Administered 2023-10-04: 12.5 mg via ORAL
  Filled 2023-10-04: qty 1

## 2023-10-04 MED ORDER — METOPROLOL TARTRATE 5 MG/5ML IV SOLN
2.5000 mg | Freq: Once | INTRAVENOUS | Status: AC
  Administered 2023-10-04: 2.5 mg via INTRAVENOUS
  Filled 2023-10-04: qty 5

## 2023-10-04 MED ORDER — LACTATED RINGERS IV BOLUS
1000.0000 mL | Freq: Once | INTRAVENOUS | Status: AC
  Administered 2023-10-04: 1000 mL via INTRAVENOUS

## 2023-10-04 NOTE — ED Provider Notes (Signed)
 Lisa Lang Provider Note   CSN: [id number] Arrival date & time: 10/04/23  1234     Patient presents with: Dizziness and Shortness of Breath   Lisa Lang is a 89 y.o. female.   89 year old female presents for evaluation of dizziness.  States she got up this morning felt lightheaded like her heart was racing when she get up and walk around.  She states she just finished antibiotics on Sunday for diverticulitis.  States her symptoms have overall improved.  Denies any abdominal pain, nausea, vomiting, or diarrhea. Denies chest pain or any other symptoms or concerns at this time.    Dizziness Associated symptoms: shortness of breath   Associated symptoms: no chest pain, no palpitations and no vomiting   Shortness of Breath Associated symptoms: no abdominal pain, no chest pain, no cough, no ear pain, no fever, no rash, no sore throat and no vomiting        Prior to Admission medications   Medication Sig Start Date End Date Taking? Authorizing Provider  acetaminophen  (TYLENOL ) 325 MG tablet Take 2 tablets (650 mg total) by mouth every 4 (four) hours as needed. 11/25/21   [name], MD  amiodarone  (PACERONE ) 100 MG tablet Take 1 tablet (100 mg total) by mouth daily. 08/24/23   [name] ORN, MD  apixaban  (ELIQUIS ) 2.5 MG TABS tablet TAKE 1 TABLET TWICE A DAY 01/20/23   [name] ORN, MD  atorvastatin  (LIPITOR) 40 MG tablet Take 40 mg by mouth at bedtime. 09/05/17   [provider]  calcium  carbonate (OS-CAL - DOSED IN MG OF ELEMENTAL CALCIUM ) 1250 MG tablet Take 1 tablet by mouth daily with breakfast.    [provider]  clobetasol ointment (TEMOVATE) 0.05 % Apply 1 Application topically 2 (two) times daily.    [provider]  levETIRAcetam  (KEPPRA ) 500 MG tablet Take 1 tablet (500 mg total) by mouth 2 (two) times daily for 7 days. 06/03/23 06/10/23  Akula, Vijaya, MD  metoprolol  succinate (TOPROL -XL) 25 MG 24  hr tablet TAKE 1/2 TABLET(12.5 MG) BY MOUTH AT BEDTIME 05/31/23   [name] ORN, MD  Multiple Vitamins-Minerals (MULTIVITAMIN PO) Take 1 tablet by mouth daily.    [provider]  pantoprazole (PROTONIX) 40 MG tablet Take 40 mg by mouth daily. 01/11/22   [provider]  Vitamin D , Ergocalciferol , 2000 units CAPS Take 2,000 Units by mouth daily.    [provider]    Allergies: Lidocaine , Amoxicillin, Sulfa antibiotics, and Ciprofloxacin    Review of Systems  Constitutional:  Negative for chills and fever.  HENT:  Negative for ear pain and sore throat.   Eyes:  Negative for pain and visual disturbance.  Respiratory:  Positive for shortness of breath. Negative for cough.   Cardiovascular:  Negative for chest pain and palpitations.  Gastrointestinal:  Negative for abdominal pain and vomiting.  Genitourinary:  Negative for dysuria and hematuria.  Musculoskeletal:  Negative for arthralgias and back pain.  Skin:  Negative for color change and rash.  Neurological:  Positive for dizziness. Negative for seizures and syncope.  All other systems reviewed and are negative.   Updated Vital Signs BP 116/63   Pulse (!) 104   Temp 98.1 F (36.7 C) (Oral)   Resp 18   Ht 5' 2 (1.575 m)   Wt 45.4 kg   SpO2 100%   BMI 18.29 kg/m   Physical Exam Vitals and nursing note reviewed.  Constitutional:  General: She is not in acute distress.    Appearance: She is well-developed. She is not ill-appearing.  HENT:     Head: Normocephalic and atraumatic.  Eyes:     Conjunctiva/sclera: Conjunctivae normal.  Cardiovascular:     Rate and Rhythm: Normal rate and regular rhythm.     Heart sounds: No murmur heard. Pulmonary:     Effort: Pulmonary effort is normal. No respiratory distress.     Breath sounds: Normal breath sounds.  Abdominal:     Palpations: Abdomen is soft.     Tenderness: There is no abdominal tenderness.  Musculoskeletal:        General: No swelling.      Cervical back: Neck supple.  Skin:    General: Skin is warm and dry.     Capillary Refill: Capillary refill takes less than 2 seconds.  Neurological:     Mental Status: She is alert.  Psychiatric:        Mood and Affect: Mood normal.     (all labs ordered are listed, but only abnormal results are displayed) Labs Reviewed  BASIC METABOLIC PANEL WITH GFR - Abnormal; Notable for the following components:      Result Value   Glucose, Bld 116 (*)    Creatinine, Ser 1.13 (*)    Calcium  8.7 (*)    GFR, Estimated 45 (*)    All other components within normal limits  CBC - Abnormal; Notable for the following components:   MCV 101.0 (*)    All other components within normal limits  PROTIME-INR - Abnormal; Notable for the following components:   Prothrombin  Time 16.7 (*)    INR 1.3 (*)    All other components within normal limits  URINALYSIS, ROUTINE W REFLEX MICROSCOPIC  TROPONIN I (HIGH SENSITIVITY)  TROPONIN I (HIGH SENSITIVITY)    EKG: EKG Interpretation Date/Time:  Tuesday October 04 2023 12:44:50 EDT Ventricular Rate:  109 PR Interval:    QRS Duration:  160 QT Interval:  352 QTC Calculation: 474 R Axis:   226  Text Interpretation: Atrial fibrillation with rapid ventricular response Right bundle branch block  Compared with prior EKG from 06/02/2023 Confirmed by Gennaro Bouchard ([id number]) on 10/04/2023 12:52:45 PM  Radiology: [facility] Chest Port 1 View Result Date: 10/04/2023 CLINICAL DATA:  [id number] SOB (shortness of breath) [id number] EXAM: PORTABLE CHEST 1 VIEW COMPARISON:  01/21/2023 FINDINGS: Stable cardiomegaly and hyperinflation. Similar chronic mild interstitial changes throughout both lungs most pronounced in the lower lobes. No superimposed acute pneumonia, collapse or consolidation. Negative for edema, effusion or pneumothorax. Trachea midline. Aorta atherosclerotic. Bones are osteopenic. Overall stable exam. IMPRESSION: 1. Stable cardiomegaly and hyperinflation. 2. Chronic  interstitial lung disease. 3. No interval change or acute process by plain radiography. Electronically Signed   By: CHRISTELLA.  Shick M.D.   On: 10/04/2023 13:48     Procedures   Medications Ordered in the ED  lactated ringers  bolus 1,000 mL (0 mLs Intravenous Stopped 10/04/23 1510)                                    Medical Decision Making Cardiac monitor interpretation: atrial fibrillation, rate controlled, in the low 100s   Patient here for some lightheadedness that sounds like orthostasis.  Given IV fluids.  She is feeling much improved and orthostatics are negative.  Heart rate in the low 100s to 1 teens, she is in atrial fibrillation.  Troponins and urinalysis are pending at this time.  Patient signed out to oncoming provider at 4 PM pending remainder of workup and ultimate disposition  Problems Addressed: Lightheadedness: acute illness or injury  Amount and/or Complexity of Data Reviewed External Data Reviewed: notes.    Details: Prior records reviewed and patient recently treated for diverticulitis Labs: ordered. Decision-making details documented in ED Course.    Details: Ordered and reviewed by me and so far unremarkable, troponins and urine are pending Radiology: ordered and independent interpretation performed. Decision-making details documented in ED Course.    Details: Ordered and interpreted by me independently radiology Chest x-ray: Shows no acute abnormality in the chest ECG/medicine tests: ordered and independent interpretation performed. Decision-making details documented in ED Course.    Details: Ordered and interpreted by me and patient shows atrial fibrillation with a rate in the low 100s, no STEMI, compared with previous  Risk OTC drugs. Prescription drug management. Drug therapy requiring intensive monitoring for toxicity.     Final diagnoses:  Lightheadedness    ED Discharge Orders     None          Gennaro Duwaine CROME, DO 10/04/23 1541

## 2023-10-04 NOTE — ED Notes (Signed)
 CCMD called.

## 2023-10-04 NOTE — ED Provider Triage Note (Signed)
 Emergency Medicine Provider Triage Evaluation Note  Lisa Lang , a 89 y.o. female  was evaluated in triage.  Pt complains of dizziness with standing that began today.  She woke up this morning stated that she had some initial dizziness when trying to walk around the house, laid back down in bed and after several hours that again attempted to walk around and had profound dizziness.  It is noted that she has a recent history of diverticulitis and has had decreased oral intake as a result.  She denies having any pain, denies headache.  Denies any shortness of breath or chest pain.  Does have a known history of mitral valve prolapse.  Review of Systems  Positive: As above Negative:   Physical Exam  Ht 5' 2 (1.575 m)   Wt 45.4 kg   BMI 18.29 kg/m  Gen:   Awake, no distress   Resp:  Normal effort  MSK:   Moves extremities without difficulty  Other:  Skin turgor is poor and oral mucosa are dry.  There is a noted split S2 as well as a S3.  Medical Decision Making  Medically screening exam initiated at 12:50 PM.  Appropriate orders placed.  Lisa Lang was informed that the remainder of the evaluation will be completed by another provider, this initial triage assessment does not replace that evaluation, and the importance of remaining in the ED until their evaluation is complete.  Secondary to chronic Eliquis , evaluate PT/INR.  Otherwise initial order set placed for generalized weakness/near syncope.   Lisa Lang, Lisa Lang 10/04/23 1252

## 2023-10-04 NOTE — ED Notes (Signed)
 Pt made aware she can eat

## 2023-10-04 NOTE — Discharge Instructions (Signed)
 To maintain adequate hydration and frequent soups clear liquids, low sugar Gatorade.  Return if develop lightheadedness, passout, chest pain, shortness of breath, uncontrolled nausea or vomiting.  Or any worsening symptoms that are concerning to you.

## 2023-10-04 NOTE — ED Triage Notes (Signed)
 Patient bib [facility] from home after waking up this morning with dizziness and shortness of breath while standing.  PT reported that blood pressure was low. Denies chest pain, does have mitral valve prolapse and a-fib.

## 2023-10-04 NOTE — ED Provider Notes (Signed)
 Received signout.  See prior team's note for full HPI.  Signed out with dispo pending repeat troponin and UA.  Patient's heart rate in the low 100s.  She is asymptomatic and feeling much improved.  She is ambulatory in the emergency department without any symptoms.  Her repeat troponin was negative.  Reviewed labs no leukocytosis to suggest infectious process.  No anemia.  Baseline kidney function.  Does not appear to have a urinary tract infection.  Gave dose of IV and Oral dose of her metoprolol . Rate in upper 90s. Will discharge in stable condition.    Neysa Caron PARAS, DO 10/04/23 2056

## 2023-10-05 DIAGNOSIS — R42 Dizziness and giddiness: Secondary | ICD-10-CM | POA: Diagnosis not present

## 2023-10-17 ENCOUNTER — Other Ambulatory Visit: Payer: Self-pay | Admitting: Cardiology

## 2023-10-17 DIAGNOSIS — I480 Paroxysmal atrial fibrillation: Secondary | ICD-10-CM

## 2023-10-17 NOTE — Telephone Encounter (Signed)
 Prescription refill request for Eliquis  received. Indication: PAF Last office visit: 05/18/23  JONETTA Clay MD Scr: 1.13 on 10/04/23  Epic Age: 89 Weight: 49kg  Based on above findings Eliquis  2.5mg  twice daily is the appropriate dose.  Refill approved.

## 2023-10-18 DIAGNOSIS — L821 Other seborrheic keratosis: Secondary | ICD-10-CM | POA: Diagnosis not present

## 2023-10-18 DIAGNOSIS — L988 Other specified disorders of the skin and subcutaneous tissue: Secondary | ICD-10-CM | POA: Diagnosis not present

## 2023-10-20 DIAGNOSIS — E039 Hypothyroidism, unspecified: Secondary | ICD-10-CM | POA: Diagnosis not present

## 2023-10-26 ENCOUNTER — Ambulatory Visit (HOSPITAL_COMMUNITY)
Admission: RE | Admit: 2023-10-26 | Discharge: 2023-10-26 | Disposition: A | Discharge status: Home / Self Care | Source: Ambulatory Visit | Attending: Internal Medicine | Admitting: Internal Medicine

## 2023-10-26 ENCOUNTER — Encounter (HOSPITAL_COMMUNITY): Payer: Self-pay | Admitting: Internal Medicine

## 2023-10-26 VITALS — BP 128/90 | HR 108 | Ht 62.0 in | Wt 102.0 lb

## 2023-10-26 DIAGNOSIS — I4819 Other persistent atrial fibrillation: Secondary | ICD-10-CM | POA: Insufficient documentation

## 2023-10-26 DIAGNOSIS — Z5181 Encounter for therapeutic drug level monitoring: Secondary | ICD-10-CM | POA: Insufficient documentation

## 2023-10-26 DIAGNOSIS — Z79899 Other long term (current) drug therapy: Secondary | ICD-10-CM | POA: Insufficient documentation

## 2023-10-26 DIAGNOSIS — D6869 Other thrombophilia: Secondary | ICD-10-CM | POA: Diagnosis not present

## 2023-10-26 DIAGNOSIS — I480 Paroxysmal atrial fibrillation: Secondary | ICD-10-CM | POA: Insufficient documentation

## 2023-10-26 MED ORDER — AMIODARONE HCL 200 MG PO TABS: ORAL_TABLET | ORAL | 1 refills | Status: DC

## 2023-10-26 NOTE — Progress Notes (Signed)
 Primary Care Physician: Alys Schuyler [place], [place] Primary Cardiologist: Dr. Anner Primary Electrophysiologist: none Referring Physician: Dr. Anner Brunet Lisa Lang is a 89 y.o. female with a history of paroxysmal atrial fibrillation, aortic atherosclerosis by CT imaging, RBBB, mitral valve prolapse with moderate to severe MR, HLD, arthritis, and PAF on Eliquis  who was seen 10/24/2021 for the evaluation of afib with RVR  after presenting with symptoms of palpitations with  dyspnea .history as well of severe MVP with moderate to severe MR. This was reviewed with Dr. 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 He was not confident that she would do well with Clip. She was admitted at [facility] from 10/21 to 10/29/21. She was treated with amiodarone  gtts and had a successful cardioversion 10/25. She is on Eliquis  2.5 mg BID.  When she saw Dr. Anner 10/30, she was back in afib. He increased amio load and planned on repeat cardioversion 11/3 and clinic was asked to see pt today prior to cardioversion. Her fluid status is stable. She is tolerating higher doses of amiodarone  well. Her neighbor is with her today. She appears to feel well today and lives independently and up to a few weeks ago was driving.   F/u in afib clinic, 11/109/23. She had a successful cardioversion and remains in SR. She feels improved. She will lower amiodarone  to 200 mg daily starting tomorrow. She is tolerating amiodarone  well.   Pt is back in afib clinic, 01/06/22 for c/o of feeling short  of breath. Her amiodarone  has been reduced to 100 mg daily since end of December. Her ekg shows sinus rhythm. She states that she feels fine sitting or lying down but short of breath with exertion. Her weight is down, no fluid observed. PO 94% on RA. No recent illness or respiratory  symptoms.   Follow up 10/26/23. Patient seen in ED on 9/30 for dizziness and heart racing found to be in Afib with  RVR. Patient discharged in Afib. She is taking amiodarone  100 mg daily. She feels sleepy and unwell when out of rhythm. No missed doses of Eliquis  2.5 mg BID.   Today, she denies symptoms of palpitations, chest pain, shortness of breath, orthopnea, PND, lower extremity edema, dizziness, presyncope, syncope, snoring, daytime somnolence, bleeding, or neurologic sequela. The patient is tolerating medications without difficulties and is otherwise without complaint today.    Atrial Fibrillation Risk Factors:  she does not have symptoms or diagnosis of sleep apnea.  she does not have a history of rheumatic fever.  she does not have a history of alcohol use.  she has a BMI of Body mass index is 18.66 kg/m.SABRA Filed Weights   10/26/23 1423  Weight: 46.3 kg     Atrial Fibrillation Management history:  Previous antiarrhythmic drugs: amiodarone    Previous cardioversions: 10/28/21, 11/06/21  Previous ablations: none  Anticoagulation history: eliquis  2.5 mg bid    Past Medical History:  Diagnosis Date   Arthritis    Diverticulitis 1995   And 2007   Dysrhythmia    Hyperlipidemia LDL goal <100    Mitral valve prolapse    Bileaflet MVP with moderate MR.   PAF (paroxysmal atrial fibrillation) (HCC) 11/2017   CHA2DS2 VASc score > 3 (Age >48 and female sex), --> Eliquis  & Metoprolol     Past Surgical History:  Procedure Laterality Date   CARDIOVERSION N/A 10/28/2021   Procedure: CARDIOVERSION;  Surgeon: Hobart Powell BRAVO, MD;  Location: [facility];  Service: Cardiovascular;  Laterality: N/A;   CARDIOVERSION N/A 11/06/2021   Procedure: CARDIOVERSION;  Surgeon: Santo Stanly LABOR, MD;  Location: [facility]OS[facility];  Service: Cardiovascular;  Laterality: N/A;   CATARACT EXTRACTION, BILATERAL     CHOLECYSTECTOMY N/A 11/24/2021   Procedure: LAPAROSCOPIC CHOLECYSTECTOMY;  Surgeon: Eletha Boas, MD;  Location: [facility];  Service: General;  Laterality: N/A;   ERCP N/A 11/23/2021   Procedure:  ENDOSCOPIC RETROGRADE CHOLANGIOPANCREATOGRAPHY (ERCP);  Surgeon: Saintclair Jasper, MD;  Location: [facility];  Service: Gastroenterology;  Laterality: N/A;   REMOVAL OF STONES  11/23/2021   Procedure: REMOVAL OF STONES;  Surgeon: Saintclair Jasper, MD;  Location: [facility];  Service: Gastroenterology;;   [name]  11/23/2021   Procedure: [name];  Surgeon: Saintclair Jasper, MD;  Location: [facility];  Service: Gastroenterology;;   TEE WITHOUT CARDIOVERSION N/A 10/28/2021   Procedure: TRANSESOPHAGEAL ECHOCARDIOGRAM (TEE) - SYNCHRONIZED DIRECT CURRENT CARDIOVERSION (DCCV);  Surgeon: Hobart Powell BRAVO, MD;  Location: [facility] ENDOSCOPY;  Service: CV;; LVEF 65-70%. No RWMA . Mod LA dilation w/o thrombus. Normal RV, Mod RA dilation. 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 Bileaflet Prolapse w/ Large Post & small Ant Jet.= 3+/Mod MR - no PVV reversal. = DCCV   TRANSTHORACIC ECHOCARDIOGRAM  10/25/2021   Notably he was very elderly woman.  No acute distress.  Just seems worn out.   TRANSTHORACIC ECHOCARDIOGRAM  12/12/2019    EF 60 to 65%.  No R WMA.  Unable to assess diastolic pressures.  Normal RV pressures.  Mild biatrial enlargement.  Moderate aortic calcification - no AS.  Severe BI-Leaflet MVP with Mod-Severe MR    Current Outpatient Medications  Medication Sig Dispense Refill   acetaminophen  (TYLENOL ) 325 MG tablet Take 2 tablets (650 mg total) by mouth every 4 (four) hours as needed.     amiodarone  (PACERONE ) 100 MG tablet Take 1 tablet (100 mg total) by mouth daily. 90 tablet 2   apixaban  (ELIQUIS ) 2.5 MG TABS tablet TAKE 1 TABLET TWICE A DAY 180 tablet 1   atorvastatin  (LIPITOR) 40 MG tablet Take 40 mg by mouth at bedtime.     calcium  carbonate (OS-CAL - DOSED IN MG OF ELEMENTAL CALCIUM ) 1250 MG tablet Take 1 tablet by mouth daily with breakfast.     clobetasol ointment (TEMOVATE) 0.05 % Apply 1 Application topically 2 (two) times daily.     levothyroxine (SYNTHROID) 50 MCG  tablet Take 50 mcg by mouth every morning.     metoprolol  succinate (TOPROL -XL) 25 MG 24 hr tablet TAKE 1/2 TABLET(12.5 MG) BY MOUTH AT BEDTIME 45 tablet 3   Multiple Vitamins-Minerals (MULTIVITAMIN PO) Take 1 tablet by mouth daily.     pantoprazole (PROTONIX) 40 MG tablet Take 40 mg by mouth daily.     tacrolimus (PROTOPIC) 0.1 % ointment Apply 1 Application topically daily.     Vitamin D , Ergocalciferol , 2000 units CAPS Take 2,000 Units by mouth daily.     No current facility-administered medications for this encounter.    Allergies  Allergen Reactions   Lidocaine  Palpitations and Other (See Comments)    True tachycardia, dizziness, vertigo   Amoxicillin Hives and Itching   Sulfa Antibiotics Hives and Itching   Ciprofloxacin Diarrhea    The patient does not have a history of early familial atrial fibrillation or other arrhythmias.  ROS- All systems are reviewed and negative except as per the HPI above.  Physical Exam: Vitals:   10/26/23 1423  BP: (!) 128/90  Pulse: (!) 108  Weight:  46.3 kg  Height: 5' 2 (1.575 m)    GEN- The patient is well appearing, alert and oriented x 3 today.   Neck - no JVD or carotid bruit noted Lungs- Clear to ausculation bilaterally, normal work of breathing Heart- Irregular rate and rhythm, no murmurs, rubs or gallops, PMI not laterally displaced Extremities- no clubbing, cyanosis, or edema Skin - no rash or ecchymosis noted   Wt Readings from Last 3 Encounters:  10/26/23 46.3 kg  10/04/23 45.4 kg  06/02/23 49 kg    EKG Vent. rate 108 BPM PR interval * ms QRS duration 160 ms QT/QTcB 394/527 ms P-R-T axes * 249 56 Atrial fibrillation with rapid ventricular response Right bundle branch block Abnormal ECG When compared with ECG of 04-Oct-2023 12:44, Previous ECG is present  Epic records are reviewed at length today  CHA2DS2-VASc Score = 4  The patient's score is based upon: CHF History: 0 HTN History: 0 Diabetes History:  0 Stroke History: 0 Vascular Disease History: 1 Age Score: 2 Gender Score: 1       ASSESSMENT AND PLAN: Persistent Atrial Fibrillation (ICD10:  I48.19) The patient's CHA2DS2-VASc score is 4, indicating a 4.8% annual risk of stroke.    Patient is currently in Afib. She is taking amiodarone  100 mg daily. She notes to be very independent and feels sleepy/unwell while in Afib. We will reload amiodarone  and increase to 200 mg BID x 1 month. I will plan to reassess patient in several weeks to determine if needs cardioversion. We discussed cardioversion procedure and patient would be interested in it if does not chemically convert to NSR.  High risk medication monitoring (ICD10: U5195107) Patient requires ongoing monitoring for anti-arrhythmic medication which has the potential to cause life threatening arrhythmias or AV block. Qtc stable. Increase amiodarone  as noted above.   Secondary Hypercoagulable State (ICD10:  D68.69) The patient is at significant risk for stroke/thromboembolism based upon her CHA2DS2-VASc Score of 4.  Continue Apixaban  (Eliquis ).  No missed doses. Dosage of 2.5 mg is correct based on age and weight.    Follow up 3 weeks Afib clinic.  Dorn Heinrich, PA-C Afib clinic [address]. [phone]

## 2023-10-26 NOTE — Patient Instructions (Signed)
 Increase Amiodarone  200mg  twice a day for 30 days (11/21) then reduce to 200mg  once a day

## 2023-11-07 ENCOUNTER — Encounter: Payer: Self-pay | Admitting: Radiology

## 2023-11-16 ENCOUNTER — Ambulatory Visit (HOSPITAL_COMMUNITY)
Admission: RE | Admit: 2023-11-16 | Discharge: 2023-11-16 | Disposition: A | Discharge status: Home / Self Care | Source: Ambulatory Visit | Attending: Internal Medicine | Admitting: Internal Medicine

## 2023-11-16 ENCOUNTER — Encounter (HOSPITAL_COMMUNITY): Payer: Self-pay | Admitting: Internal Medicine

## 2023-11-16 VITALS — BP 126/78 | HR 105 | Ht 62.0 in | Wt 103.6 lb

## 2023-11-16 DIAGNOSIS — Z79899 Other long term (current) drug therapy: Secondary | ICD-10-CM | POA: Diagnosis not present

## 2023-11-16 DIAGNOSIS — I4819 Other persistent atrial fibrillation: Secondary | ICD-10-CM | POA: Insufficient documentation

## 2023-11-16 DIAGNOSIS — Z5181 Encounter for therapeutic drug level monitoring: Secondary | ICD-10-CM | POA: Diagnosis present

## 2023-11-16 DIAGNOSIS — I480 Paroxysmal atrial fibrillation: Secondary | ICD-10-CM | POA: Insufficient documentation

## 2023-11-16 DIAGNOSIS — D6869 Other thrombophilia: Secondary | ICD-10-CM | POA: Insufficient documentation

## 2023-11-16 NOTE — H&P (View-Only) (Signed)
 Primary Care Physician: Alys Schuyler [place], [place] Primary Cardiologist: Dr. Anner Primary Electrophysiologist: none Referring Physician: Dr. Anner Brunet Lisa Lang is a 89 y.o. female with a history of paroxysmal atrial fibrillation, aortic atherosclerosis by CT imaging, RBBB, mitral valve prolapse with moderate to severe MR, HLD, arthritis, and PAF on Eliquis  who was seen 10/24/2021 for the evaluation of afib with RVR  after presenting with symptoms of palpitations with  dyspnea .history as well of severe MVP with moderate to severe MR. This was reviewed with Dr. 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 He was not confident that she would do well with Clip. She was admitted at [facility] from 10/21 to 10/29/21. She was treated with amiodarone  gtts and had a successful cardioversion 10/25. She is on Eliquis  2.5 mg BID.  When she saw Dr. Anner 10/30, she was back in afib. He increased amio load and planned on repeat cardioversion 11/3 and clinic was asked to see pt today prior to cardioversion. Her fluid status is stable. She is tolerating higher doses of amiodarone  well. Her neighbor is with her today. She appears to feel well today and lives independently and up to a few weeks ago was driving.   F/u in afib clinic, 11/109/23. She had a successful cardioversion and remains in SR. She feels improved. She will lower amiodarone  to 200 mg daily starting tomorrow. She is tolerating amiodarone  well.   Pt is back in afib clinic, 01/06/22 for c/o of feeling short  of breath. Her amiodarone  has been reduced to 100 mg daily since end of December. Her ekg shows sinus rhythm. She states that she feels fine sitting or lying down but short of breath with exertion. Her weight is down, no fluid observed. PO 94% on RA. No recent illness or respiratory  symptoms.   Follow up 10/26/23. Patient seen in ED on 9/30 for dizziness and heart racing found to be in Afib with  RVR. Patient discharged in Afib. She is taking amiodarone  100 mg daily. She feels sleepy and unwell when out of rhythm. No missed doses of Eliquis  2.5 mg BID.   Follow up 11/16/23, patient is currently in Afib. She began amiodarone  load at last office visit. No missed doses of Eliquis . She does note to feel very tired and have no energy when out of rhythm.   Today, she denies symptoms of palpitations, chest pain, shortness of breath, orthopnea, PND, lower extremity edema, dizziness, presyncope, syncope, snoring, daytime somnolence, bleeding, or neurologic sequela. The patient is tolerating medications without difficulties and is otherwise without complaint today.    Atrial Fibrillation Risk Factors:  she does not have symptoms or diagnosis of sleep apnea.  she does not have a history of rheumatic fever.  she does not have a history of alcohol use.  she has a BMI of Body mass index is 18.95 kg/m.SABRA Filed Weights   11/16/23 1406  Weight: 47 kg      Atrial Fibrillation Management history:  Previous antiarrhythmic drugs: amiodarone    Previous cardioversions: 10/28/21, 11/06/21  Previous ablations: none  Anticoagulation history: eliquis  2.5 mg bid    Past Medical History:  Diagnosis Date   Arthritis    Diverticulitis 1995   And 2007   Dysrhythmia    Hyperlipidemia LDL goal <100    Mitral valve prolapse    Bileaflet MVP with moderate MR.   PAF (paroxysmal atrial fibrillation) (HCC) 11/2017   CHA2DS2 VASc score > 3 (  Age >46 and female sex), --> Eliquis  & Metoprolol     Past Surgical History:  Procedure Laterality Date   CARDIOVERSION N/A 10/28/2021   Procedure: CARDIOVERSION;  Surgeon: Hobart Powell BRAVO, MD;  Location: [facility] ENDOSCOPY;  Service: Cardiovascular;  Laterality: N/A;   CARDIOVERSION N/A 11/06/2021   Procedure: CARDIOVERSION;  Surgeon: Santo Stanly LABOR, MD;  Location: [facility] ENDOSCOPY;  Service: Cardiovascular;  Laterality: N/A;   CATARACT EXTRACTION,  BILATERAL     CHOLECYSTECTOMY N/A 11/24/2021   Procedure: LAPAROSCOPIC CHOLECYSTECTOMY;  Surgeon: Eletha Boas, MD;  Location: [facility];  Service: General;  Laterality: N/A;   ERCP N/A 11/23/2021   Procedure: ENDOSCOPIC RETROGRADE CHOLANGIOPANCREATOGRAPHY (ERCP);  Surgeon: Saintclair Jasper, MD;  Location: [facility];  Service: Gastroenterology;  Laterality: N/A;   REMOVAL OF STONES  11/23/2021   Procedure: REMOVAL OF STONES;  Surgeon: Saintclair Jasper, MD;  Location: [facility];  Service: Gastroenterology;;   [name]  11/23/2021   Procedure: [name];  Surgeon: Saintclair Jasper, MD;  Location: [facility]OS[facility];  Service: Gastroenterology;;   TEE WITHOUT CARDIOVERSION N/A 10/28/2021   Procedure: TRANSESOPHAGEAL ECHOCARDIOGRAM (TEE) - SYNCHRONIZED DIRECT CURRENT CARDIOVERSION (DCCV);  Surgeon: Hobart Powell BRAVO, MD;  Location[facility] ENDOSCOPY;  Service: CV;; LVEF 65-70%. No RWMA . Mod LA dilation w/o thrombus. Normal RV, Mod RA dilation. 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 Bileaflet Prolapse w/ Large Post & small Ant Jet.= 3+/Mod MR - no PVV reversal. = DCCV   TRANSTHORACIC ECHOCARDIOGRAM  10/25/2021   Notably he was very elderly woman.  No acute distress.  Just seems worn out.   TRANSTHORACIC ECHOCARDIOGRAM  12/12/2019    EF 60 to 65%.  No R WMA.  Unable to assess diastolic pressures.  Normal RV pressures.  Mild biatrial enlargement.  Moderate aortic calcification - no AS.  Severe BI-Leaflet MVP with Mod-Severe MR    Current Outpatient Medications  Medication Sig Dispense Refill   acetaminophen  (TYLENOL ) 325 MG tablet Take 2 tablets (650 mg total) by mouth every 4 (four) hours as needed.     amiodarone  (PACERONE ) 200 MG tablet Take 1 tablet (200 mg total) by mouth 2 (two) times daily for 30 days, THEN 1 tablet (200 mg total) daily. 60 tablet 1   apixaban  (ELIQUIS ) 2.5 MG TABS tablet TAKE 1 TABLET TWICE A DAY 180 tablet 1   atorvastatin  (LIPITOR) 40 MG tablet Take 40 mg by mouth at  bedtime.     calcium  carbonate (OS-CAL - DOSED IN MG OF ELEMENTAL CALCIUM ) 1250 MG tablet Take 1 tablet by mouth daily with breakfast.     clobetasol ointment (TEMOVATE) 0.05 % Apply 1 Application topically 2 (two) times daily.     levothyroxine (SYNTHROID) 50 MCG tablet Take 50 mcg by mouth every morning.     metoprolol  succinate (TOPROL -XL) 25 MG 24 hr tablet TAKE 1/2 TABLET(12.5 MG) BY MOUTH AT BEDTIME 45 tablet 3   Multiple Vitamins-Minerals (MULTIVITAMIN PO) Take 1 tablet by mouth daily.     pantoprazole (PROTONIX) 40 MG tablet Take 40 mg by mouth daily.     tacrolimus (PROTOPIC) 0.1 % ointment Apply 1 Application topically daily.     Vitamin D , Ergocalciferol , 2000 units CAPS Take 2,000 Units by mouth daily.     No current facility-administered medications for this encounter.    Allergies  Allergen Reactions   Lidocaine  Palpitations and Other (See Comments)    True tachycardia, dizziness, vertigo   Amoxicillin Hives and Itching   Sulfa Antibiotics Hives and Itching  Ciprofloxacin Diarrhea    The patient does not have a history of early familial atrial fibrillation or other arrhythmias.  ROS- All systems are reviewed and negative except as per the HPI above.  Physical Exam: Vitals:   11/16/23 1406  BP: 126/78  Pulse: (!) 105  Weight: 47 kg  Height: 5' 2 (1.575 m)    GEN- The patient is well appearing, alert and oriented x 3 today.   Neck - no JVD or carotid bruit noted Lungs- Clear to ausculation bilaterally, normal work of breathing Heart- Irregular rate and rhythm, no murmurs, rubs or gallops, PMI not laterally displaced Extremities- no clubbing, cyanosis, or edema Skin - no rash or ecchymosis noted   Wt Readings from Last 3 Encounters:  11/16/23 47 kg  10/26/23 46.3 kg  10/04/23 45.4 kg    EKG Vent. rate 105 BPM PR interval * ms QRS duration 162 ms QT/QTcB 410/541 ms P-R-T axes * 264 83 Atrial fibrillation with rapid ventricular response Right bundle  branch block Possible Lateral infarct , age undetermined Abnormal ECG When compared with ECG of 26-Oct-2023 14:26, Previous ECG is present  Epic records are reviewed at length today  CHA2DS2-VASc Score = 4  The patient's score is based upon: CHF History: 0 HTN History: 0 Diabetes History: 0 Stroke History: 0 Vascular Disease History: 1 Age Score: 2 Gender Score: 1       ASSESSMENT AND PLAN: Persistent Atrial Fibrillation (ICD10:  I48.19) The patient's CHA2DS2-VASc score is 4, indicating a 4.8% annual risk of stroke.    Patient is currently in Afib. We discussed the procedure cardioversion to try to convert to NSR. We discussed the risks vs benefits of this procedure and how ultimately we cannot predict whether a patient will have early return of arrhythmia post procedure. After discussion, the patient wishes to proceed with cardioversion. Labs drawn today.   Informed Consent   Shared Decision Making/Informed Consent The risks (stroke, cardiac arrhythmias rarely resulting in the need for a temporary or permanent pacemaker, skin irritation or burns and complications associated with conscious sedation including aspiration, arrhythmia, respiratory failure and death), benefits (restoration of normal sinus rhythm) and alternatives of a direct current cardioversion were explained in detail to Ms. Gadbois and she agrees to proceed.        High risk medication monitoring (ICD10: J342684) Patient requires ongoing monitoring for anti-arrhythmic medication which has the potential to cause life threatening arrhythmias or AV block. Qtc stable. Continue amiodarone  load as prescribed.   Secondary Hypercoagulable State (ICD10:  D68.69) The patient is at significant risk for stroke/thromboembolism based upon her CHA2DS2-VASc Score of 4.  Continue Apixaban  (Eliquis ).  No missed doses. Dosage of 2.5 mg is correct based on age and weight.    Follow up 2 weeks after DCCV.   Dorn Heinrich,  PA-C Afib clinic [address] [phone]

## 2023-11-16 NOTE — Progress Notes (Signed)
 Primary Care Physician: Alys Schuyler [place], [place] Primary Cardiologist: Dr. Anner Primary Electrophysiologist: none Referring Physician: Dr. Anner Lisa Lang is a 89 y.o. female with a history of paroxysmal atrial fibrillation, aortic atherosclerosis by CT imaging, RBBB, mitral valve prolapse with moderate to severe MR, HLD, arthritis, and PAF on Eliquis  who was seen 10/24/2021 for the evaluation of afib with RVR  after presenting with symptoms of palpitations with  dyspnea .history as well of severe MVP with moderate to severe MR. This was reviewed with Dr. 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 He was not confident that she would do well with Clip. She was admitted at [facility] from 10/21 to 10/29/21. She was treated with amiodarone  gtts and had a successful cardioversion 10/25. She is on Eliquis  2.5 mg BID.  When she saw Dr. Anner 10/30, she was back in afib. He increased amio load and planned on repeat cardioversion 11/3 and clinic was asked to see pt today prior to cardioversion. Her fluid status is stable. She is tolerating higher doses of amiodarone  well. Her neighbor is with her today. She appears to feel well today and lives independently and up to a few weeks ago was driving.   F/u in afib clinic, 11/109/23. She had a successful cardioversion and remains in SR. She feels improved. She will lower amiodarone  to 200 mg daily starting tomorrow. She is tolerating amiodarone  well.   Pt is back in afib clinic, 01/06/22 for c/o of feeling short  of breath. Her amiodarone  has been reduced to 100 mg daily since end of December. Her ekg shows sinus rhythm. She states that she feels fine sitting or lying down but short of breath with exertion. Her weight is down, no fluid observed. PO 94% on RA. No recent illness or respiratory  symptoms.   Follow up 10/26/23. Patient seen in ED on 9/30 for dizziness and heart racing found to be in Afib with  RVR. Patient discharged in Afib. She is taking amiodarone  100 mg daily. She feels sleepy and unwell when out of rhythm. No missed doses of Eliquis  2.5 mg BID.   Follow up 11/16/23, patient is currently in Afib. She began amiodarone  load at last office visit. No missed doses of Eliquis . She does note to feel very tired and have no energy when out of rhythm.   Today, she denies symptoms of palpitations, chest pain, shortness of breath, orthopnea, PND, lower extremity edema, dizziness, presyncope, syncope, snoring, daytime somnolence, bleeding, or neurologic sequela. The patient is tolerating medications without difficulties and is otherwise without complaint today.    Atrial Fibrillation Risk Factors:  she does not have symptoms or diagnosis of sleep apnea.  she does not have a history of rheumatic fever.  she does not have a history of alcohol use.  she has a BMI of Body mass index is 18.95 kg/m.SABRA Filed Weights   11/16/23 1406  Weight: 47 kg      Atrial Fibrillation Management history:  Previous antiarrhythmic drugs: amiodarone    Previous cardioversions: 10/28/21, 11/06/21  Previous ablations: none  Anticoagulation history: eliquis  2.5 mg bid    Past Medical History:  Diagnosis Date   Arthritis    Diverticulitis 1995   And 2007   Dysrhythmia    Hyperlipidemia LDL goal <100    Mitral valve prolapse    Bileaflet MVP with moderate MR.   PAF (paroxysmal atrial fibrillation) (HCC) 11/2017   CHA2DS2 VASc score > 3 (  Age >46 and female sex), --> Eliquis  & Metoprolol     Past Surgical History:  Procedure Laterality Date   CARDIOVERSION N/A 10/28/2021   Procedure: CARDIOVERSION;  Surgeon: Hobart Powell BRAVO, MD;  Location: [facility] ENDOSCOPY;  Service: Cardiovascular;  Laterality: N/A;   CARDIOVERSION N/A 11/06/2021   Procedure: CARDIOVERSION;  Surgeon: Santo Stanly LABOR, MD;  Location: [facility] ENDOSCOPY;  Service: Cardiovascular;  Laterality: N/A;   CATARACT EXTRACTION,  BILATERAL     CHOLECYSTECTOMY N/A 11/24/2021   Procedure: LAPAROSCOPIC CHOLECYSTECTOMY;  Surgeon: Eletha Boas, MD;  Location: [facility] ORS;  Service: General;  Laterality: N/A;   ERCP N/A 11/23/2021   Procedure: ENDOSCOPIC RETROGRADE CHOLANGIOPANCREATOGRAPHY (ERCP);  Surgeon: Saintclair Jasper, MD;  Location: [facility] ENDOSCOPY;  Service: Gastroenterology;  Laterality: N/A;   REMOVAL OF STONES  11/23/2021   Procedure: REMOVAL OF STONES;  Surgeon: Saintclair Jasper, MD;  Location: [facility] ENDOSCOPY;  Service: Gastroenterology;;   [name]  11/23/2021   Procedure: [name];  Surgeon: Saintclair Jasper, MD;  Location: [facility] ENDOSCOPY;  Service: Gastroenterology;;   TEE WITHOUT CARDIOVERSION N/A 10/28/2021   Procedure: TRANSESOPHAGEAL ECHOCARDIOGRAM (TEE) - SYNCHRONIZED DIRECT CURRENT CARDIOVERSION (DCCV);  Surgeon: Hobart Powell BRAVO, MD;  Location: [facility] ENDOSCOPY;  Service: CV;; LVEF 65-70%. No RWMA . Mod LA dilation w/o thrombus. Normal RV, Mod RA dilation. 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 Bileaflet Prolapse w/ Large Post & small Ant Jet.= 3+/Mod MR - no PVV reversal. = DCCV   TRANSTHORACIC ECHOCARDIOGRAM  10/25/2021   Notably he was very elderly woman.  No acute distress.  Just seems worn out.   TRANSTHORACIC ECHOCARDIOGRAM  12/12/2019    EF 60 to 65%.  No R WMA.  Unable to assess diastolic pressures.  Normal RV pressures.  Mild biatrial enlargement.  Moderate aortic calcification - no AS.  Severe BI-Leaflet MVP with Mod-Severe MR    Current Outpatient Medications  Medication Sig Dispense Refill   acetaminophen  (TYLENOL ) 325 MG tablet Take 2 tablets (650 mg total) by mouth every 4 (four) hours as needed.     amiodarone  (PACERONE ) 200 MG tablet Take 1 tablet (200 mg total) by mouth 2 (two) times daily for 30 days, THEN 1 tablet (200 mg total) daily. 60 tablet 1   apixaban  (ELIQUIS ) 2.5 MG TABS tablet TAKE 1 TABLET TWICE A DAY 180 tablet 1   atorvastatin  (LIPITOR) 40 MG tablet Take 40 mg by mouth at  bedtime.     calcium  carbonate (OS-CAL - DOSED IN MG OF ELEMENTAL CALCIUM ) 1250 MG tablet Take 1 tablet by mouth daily with breakfast.     clobetasol ointment (TEMOVATE) 0.05 % Apply 1 Application topically 2 (two) times daily.     levothyroxine (SYNTHROID) 50 MCG tablet Take 50 mcg by mouth every morning.     metoprolol  succinate (TOPROL -XL) 25 MG 24 hr tablet TAKE 1/2 TABLET(12.5 MG) BY MOUTH AT BEDTIME 45 tablet 3   Multiple Vitamins-Minerals (MULTIVITAMIN PO) Take 1 tablet by mouth daily.     pantoprazole (PROTONIX) 40 MG tablet Take 40 mg by mouth daily.     tacrolimus (PROTOPIC) 0.1 % ointment Apply 1 Application topically daily.     Vitamin D , Ergocalciferol , 2000 units CAPS Take 2,000 Units by mouth daily.     No current facility-administered medications for this encounter.    Allergies  Allergen Reactions   Lidocaine  Palpitations and Other (See Comments)    True tachycardia, dizziness, vertigo   Amoxicillin Hives and Itching   Sulfa Antibiotics Hives and Itching  Ciprofloxacin Diarrhea    The patient does not have a history of early familial atrial fibrillation or other arrhythmias.  ROS- All systems are reviewed and negative except as per the HPI above.  Physical Exam: Vitals:   11/16/23 1406  BP: 126/78  Pulse: (!) 105  Weight: 47 kg  Height: 5' 2 (1.575 m)    GEN- The patient is well appearing, alert and oriented x 3 today.   Neck - no JVD or carotid bruit noted Lungs- Clear to ausculation bilaterally, normal work of breathing Heart- Irregular rate and rhythm, no murmurs, rubs or gallops, PMI not laterally displaced Extremities- no clubbing, cyanosis, or edema Skin - no rash or ecchymosis noted   Wt Readings from Last 3 Encounters:  11/16/23 47 kg  10/26/23 46.3 kg  10/04/23 45.4 kg    EKG Vent. rate 105 BPM PR interval * ms QRS duration 162 ms QT/QTcB 410/541 ms P-R-T axes * 264 83 Atrial fibrillation with rapid ventricular response Right bundle  branch block Possible Lateral infarct , age undetermined Abnormal ECG When compared with ECG of 26-Oct-2023 14:26, Previous ECG is present  Epic records are reviewed at length today  CHA2DS2-VASc Score = 4  The patient's score is based upon: CHF History: 0 HTN History: 0 Diabetes History: 0 Stroke History: 0 Vascular Disease History: 1 Age Score: 2 Gender Score: 1       ASSESSMENT AND PLAN: Persistent Atrial Fibrillation (ICD10:  I48.19) The patient's CHA2DS2-VASc score is 4, indicating a 4.8% annual risk of stroke.    Patient is currently in Afib. We discussed the procedure cardioversion to try to convert to NSR. We discussed the risks vs benefits of this procedure and how ultimately we cannot predict whether a patient will have early return of arrhythmia post procedure. After discussion, the patient wishes to proceed with cardioversion. Labs drawn today.   Informed Consent   Shared Decision Making/Informed Consent The risks (stroke, cardiac arrhythmias rarely resulting in the need for a temporary or permanent pacemaker, skin irritation or burns and complications associated with conscious sedation including aspiration, arrhythmia, respiratory failure and death), benefits (restoration of normal sinus rhythm) and alternatives of a direct current cardioversion were explained in detail to Ms. Gadbois and she agrees to proceed.        High risk medication monitoring (ICD10: J342684) Patient requires ongoing monitoring for anti-arrhythmic medication which has the potential to cause life threatening arrhythmias or AV block. Qtc stable. Continue amiodarone  load as prescribed.   Secondary Hypercoagulable State (ICD10:  D68.69) The patient is at significant risk for stroke/thromboembolism based upon her CHA2DS2-VASc Score of 4.  Continue Apixaban  (Eliquis ).  No missed doses. Dosage of 2.5 mg is correct based on age and weight.    Follow up 2 weeks after DCCV.   Dorn Heinrich,  PA-C Afib clinic [address] [phone]

## 2023-11-16 NOTE — Patient Instructions (Addendum)
 Cardioversion scheduled for: 11/25/23 Friday 9:00am   - Arrive at the [employer] [facility] ([address])  and check in with ADMITTING at 9:00am   - Do not eat or drink anything after midnight the night prior to your procedure.   - Take all your morning medication (except diabetic medications) with a sip of water prior to arrival.  - Do NOT miss any doses of your blood thinner - if you should miss a dose or take a dose more than 4 hours late -- please notify our office immediately.  - You will not be able to drive home after your procedure. Please ensure you have a responsible adult to drive you home. You will need someone with you for 24 hours post procedure.     - Expect to be in the procedural area approximately 2 hours.   - If you feel as if you go back into normal rhythm prior to scheduled cardioversion, please notify our office immediately.   If your procedure is canceled in the cardioversion suite you will be charged a cancellation fee.

## 2023-11-17 ENCOUNTER — Ambulatory Visit (HOSPITAL_COMMUNITY): Payer: Self-pay | Admitting: Internal Medicine

## 2023-11-17 LAB — BASIC METABOLIC PANEL WITH GFR
BUN/Creatinine Ratio: 20 (ref 12–28)
BUN: 24 mg/dL (ref 10–36)
CO2: 25 mmol/L (ref 20–29)
Calcium: 9.3 mg/dL (ref 8.7–10.3)
Chloride: 102 mmol/L (ref 96–106)
Creatinine, Ser: 1.21 mg/dL — ABNORMAL HIGH (ref 0.57–1.00)
Glucose: 97 mg/dL (ref 70–99)
Potassium: 4.6 mmol/L (ref 3.5–5.2)
Sodium: 141 mmol/L (ref 134–144)
eGFR: 42 mL/min/1.73 — ABNORMAL LOW (ref >59)

## 2023-11-17 LAB — CBC
Hematocrit: 42.8 % (ref 34.0–46.6)
Hemoglobin: 13.2 g/dL (ref 11.1–15.9)
MCH: 31.1 pg (ref 26.6–33.0)
MCHC: 30.8 g/dL — ABNORMAL LOW (ref 31.5–35.7)
MCV: 101 fL — ABNORMAL HIGH (ref 79–97)
Platelets: 251 x10E3/uL (ref 150–450)
RBC: 4.24 x10E6/uL (ref 3.77–5.28)
RDW: 11.8 % (ref 11.7–15.4)
WBC: 8.2 x10E3/uL (ref 3.4–10.8)

## 2023-11-21 DIAGNOSIS — L988 Other specified disorders of the skin and subcutaneous tissue: Secondary | ICD-10-CM | POA: Diagnosis not present

## 2023-11-24 NOTE — Progress Notes (Signed)
 Called patient with pre-procedure instructions for tomorrow. Pt did not answer; left general voicemail with callback number.

## 2023-11-25 ENCOUNTER — Ambulatory Visit (HOSPITAL_COMMUNITY): Admitting: Anesthesiology

## 2023-11-25 ENCOUNTER — Ambulatory Visit (HOSPITAL_COMMUNITY)
Admission: RE | Admit: 2023-11-25 | Discharge: 2023-11-25 | Disposition: A | Discharge status: Home / Self Care | Attending: Cardiology | Admitting: Cardiology

## 2023-11-25 ENCOUNTER — Encounter (HOSPITAL_COMMUNITY): Payer: Self-pay | Admitting: Cardiology

## 2023-11-25 ENCOUNTER — Encounter (HOSPITAL_COMMUNITY)
Admission: RE | Disposition: A | Discharge status: Home / Self Care | Payer: Self-pay | Source: Home / Self Care | Attending: Cardiology

## 2023-11-25 ENCOUNTER — Other Ambulatory Visit: Payer: Self-pay

## 2023-11-25 DIAGNOSIS — I4819 Other persistent atrial fibrillation: Secondary | ICD-10-CM | POA: Diagnosis not present

## 2023-11-25 DIAGNOSIS — I4891 Unspecified atrial fibrillation: Secondary | ICD-10-CM

## 2023-11-25 DIAGNOSIS — I340 Nonrheumatic mitral (valve) insufficiency: Secondary | ICD-10-CM | POA: Insufficient documentation

## 2023-11-25 DIAGNOSIS — D6869 Other thrombophilia: Secondary | ICD-10-CM | POA: Diagnosis not present

## 2023-11-25 DIAGNOSIS — E785 Hyperlipidemia, unspecified: Secondary | ICD-10-CM | POA: Insufficient documentation

## 2023-11-25 DIAGNOSIS — Z006 Encounter for examination for normal comparison and control in clinical research program: Secondary | ICD-10-CM

## 2023-11-25 DIAGNOSIS — Z79899 Other long term (current) drug therapy: Secondary | ICD-10-CM | POA: Diagnosis not present

## 2023-11-25 DIAGNOSIS — I341 Nonrheumatic mitral (valve) prolapse: Secondary | ICD-10-CM | POA: Diagnosis not present

## 2023-11-25 DIAGNOSIS — Z7901 Long term (current) use of anticoagulants: Secondary | ICD-10-CM | POA: Diagnosis not present

## 2023-11-25 HISTORY — PX: CARDIOVERSION: EP1203

## 2023-11-25 MED ORDER — SODIUM CHLORIDE 0.9 % IV SOLN: INTRAVENOUS | Status: DC

## 2023-11-25 MED ORDER — PROPOFOL 10 MG/ML IV BOLUS
INTRAVENOUS | Status: DC | PRN
  Administered 2023-11-25: 50 mg via INTRAVENOUS

## 2023-11-25 NOTE — Transfer of Care (Signed)
 Immediate Anesthesia Transfer of Care Note  Patient: Lisa Lang  Procedure(s) Performed: CARDIOVERSION  Patient Location: PACU and Cath Lab  Anesthesia Type:General  Level of Consciousness: drowsy, patient cooperative, and responds to stimulation  Airway & Oxygen Therapy: Patient Spontanous Breathing and Patient connected to face mask oxygen  Post-op Assessment: Report given to RN and Post -op Vital signs reviewed and stable  Post vital signs: Reviewed and stable  Last Vitals:  Vitals Value Taken Time  BP    Temp    Pulse 96 11/25/23 09:53  Resp 18 11/25/23 09:53  SpO2 92 % 11/25/23 09:53  Vitals shown include unfiled device data.  Last Pain:  Vitals:   11/25/23 0900  TempSrc: Temporal         Complications: No notable events documented.

## 2023-11-25 NOTE — Discharge Instructions (Signed)

## 2023-11-25 NOTE — Anesthesia Postprocedure Evaluation (Signed)
 Anesthesia Post Note  Patient: Lisa Lang  Procedure(s) Performed: CARDIOVERSION     Patient location during evaluation: PACU Anesthesia Type: General Level of consciousness: awake and alert Pain management: pain level controlled Vital Signs Assessment: post-procedure vital signs reviewed and stable Respiratory status: spontaneous breathing, nonlabored ventilation and respiratory function stable Cardiovascular status: stable and blood pressure returned to baseline Anesthetic complications: no   No notable events documented.  Last Vitals:  Vitals:   11/25/23 1021 11/25/23 1031  BP: (!) 102/50 (!) 107/50  Pulse: 61 64  Resp: 15 17  Temp:    SpO2: 95% 95%    Last Pain:  Vitals:   11/25/23 1031  TempSrc:   PainSc: 0-No pain                 Debby FORBES Like

## 2023-11-25 NOTE — Research (Signed)
 [facility] Cardioversion Informed Consent   Subject Name: Lisa Lang  Subject met inclusion and exclusion criteria.  The informed consent form, study requirements and expectations were reviewed with the subject and questions and concerns were addressed prior to the signing of the consent form.  The subject verbalized understanding of the trial requirements.  The subject agreed to participate in the [facility] Cardioversion trial and signed the informed consent at 0910 on 21/Nov/2025.  The informed consent was obtained prior to performance of any protocol-specific procedures for the subject.  A copy of the signed informed consent was given to the subject and a copy was placed in the subject's medical record.   [name] [name]

## 2023-11-25 NOTE — CV Procedure (Signed)
   DIRECT CURRENT CARDIOVERSION  NAME:  Lisa Lang    MRN: [id number] DOB:  10/28/1927    ADMIT DATE: 11/25/2023  Indication:  Symptomatic atrial fibrillation  Procedure Note:  The patient signed informed consent.  She has been on therapeutic anticoagulation with Eliquis  greater than or equal to 3 weeks.  Anesthesia was administered by Dr.Brock.  Adequate airway was maintained throughout and vital followed per protocol.  She was cardioverted x 1 with 150J of biphasic synchronized energy.  Post procedure rhythm was Sinus with ectopy. There were no apparent complications.  The patient had normal neuro status and respiratory status post procedure with vitals stable as recorded elsewhere.    Follow up:  Continue on current medical therapy and follow up with cardiology as scheduled. Updated [name] per patient request.  Continue current medications  Follow up is arranged.   Madonna Michele HAS, [facility]  A Division of [facility] [address], [place], [place] [address]  11:03 AM

## 2023-11-25 NOTE — Interval H&P Note (Signed)
 History and Physical Interval Note:  11/25/2023 9:33 AM  Lonell SHAUNNA Eve  has presented today for surgery, with the diagnosis of afib.  The various methods of treatment have been discussed with the patient and family. After consideration of risks, benefits and other options for treatment, the patient has consented to  Procedure(s): CARDIOVERSION (N/A) as a surgical intervention.  The patient's history has been reviewed, patient examined, no change in status, stable for surgery.  I have reviewed the patient's chart and labs.  Questions were answered to the patient's satisfaction.    Informed Consent   Shared Decision Making/Informed Consent The risks (stroke, cardiac arrhythmias rarely resulting in the need for a temporary or permanent pacemaker, skin irritation or burns and complications associated with conscious sedation including aspiration, arrhythmia, respiratory failure and death), benefits (restoration of normal sinus rhythm) and alternatives of a direct current cardioversion were explained in detail to Ms. Gilberti and she agrees to proceed.      No missed doses of Eliquis .  Contact person: Rimmer,Cheryl   Dr. Michele 9:35 AM

## 2023-11-25 NOTE — Anesthesia Preprocedure Evaluation (Addendum)
 Anesthesia Evaluation  Patient identified by MRN, date of birth, ID band Patient awake    Reviewed: Allergy & Precautions, NPO status , Patient's Chart, lab work & pertinent test results  History of Anesthesia Complications Negative for: history of anesthetic complications  Airway Mallampati: II  TM Distance: >3 FB Neck ROM: Full    Dental  (+) Dental Advisory Given   Pulmonary neg pulmonary ROS   Pulmonary exam normal        Cardiovascular + dysrhythmias Atrial Fibrillation + Valvular Problems/Murmurs MVP and MR  Rhythm:Irregular Rate:Normal + Systolic murmurs  '23 TEE - EF 65 to 70%. Left atrial size was moderately dilated. Right atrial size was moderately dilated. The mitral valve is myxomatous with a large overriding anterior mitral valve leaflet and a diminutive posterior leaflet that measures about 8-44mm  in length. 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 Overall, mitral regurgitation appears moderate (EROA of posterior jet 0.2cm2, RVol 21mL). There is no systolic flow reversal in the pulmonary veins. Tricuspid valve regurgitation is mild to moderate. Aortic valve regurgitation is mild. Aortic valve sclerosis/calcification is present, without any evidence of aortic stenosis. Following TEE, the patient underwent successful DCCV with 150J x1.     Neuro/Psych negative neurological ROS  negative psych ROS   GI/Hepatic negative GI ROS, Neg liver ROS,,,  Endo/Other  negative endocrine ROS    Renal/GU negative Renal ROS     Musculoskeletal  (+) Arthritis ,    Abdominal   Peds  Hematology  On eliquis     Anesthesia Other Findings   Reproductive/Obstetrics                              Anesthesia Physical Anesthesia Plan  ASA: 3  Anesthesia Plan: General   Post-op Pain Management: Minimal or no pain anticipated   Induction:  Intravenous  PONV Risk Score and Plan: 3 and Treatment may vary due to age or medical condition and Propofol  infusion  Airway Management Planned: Natural Airway and Mask  Additional Equipment: None  Intra-op Plan:   Post-operative Plan:   Informed Consent: I have reviewed the patients History and Physical, chart, labs and discussed the procedure including the risks, benefits and alternatives for the proposed anesthesia with the patient or authorized representative who has indicated his/her understanding and acceptance.       Plan Discussed with: CRNA and Anesthesiologist  Anesthesia Plan Comments:          Anesthesia Quick Evaluation

## 2023-11-26 ENCOUNTER — Encounter (HOSPITAL_COMMUNITY): Payer: Self-pay | Admitting: Cardiology

## 2023-12-09 ENCOUNTER — Ambulatory Visit (HOSPITAL_COMMUNITY): Admitting: Internal Medicine

## 2023-12-18 ENCOUNTER — Other Ambulatory Visit (HOSPITAL_COMMUNITY): Payer: Self-pay | Admitting: Internal Medicine

## 2023-12-22 ENCOUNTER — Ambulatory Visit (HOSPITAL_COMMUNITY): Admission: RE | Admit: 2023-12-22 | Discharge: 2023-12-22 | Attending: Internal Medicine | Admitting: Internal Medicine

## 2023-12-22 VITALS — BP 132/56 | HR 64 | Ht 62.0 in | Wt 96.4 lb

## 2023-12-22 DIAGNOSIS — I4819 Other persistent atrial fibrillation: Secondary | ICD-10-CM | POA: Diagnosis present

## 2023-12-22 DIAGNOSIS — Z5181 Encounter for therapeutic drug level monitoring: Secondary | ICD-10-CM | POA: Insufficient documentation

## 2023-12-22 DIAGNOSIS — I4891 Unspecified atrial fibrillation: Secondary | ICD-10-CM | POA: Insufficient documentation

## 2023-12-22 DIAGNOSIS — D6869 Other thrombophilia: Secondary | ICD-10-CM | POA: Insufficient documentation

## 2023-12-22 DIAGNOSIS — Z79899 Other long term (current) drug therapy: Secondary | ICD-10-CM | POA: Insufficient documentation

## 2023-12-22 NOTE — Progress Notes (Signed)
 Primary Care Physician: Alys Schuyler HERO, [place] Primary Cardiologist: Dr. Anner Primary Electrophysiologist: none Referring Physician: Dr. Anner Brunet Lisa Lang is a 89 y.o. female with a history of paroxysmal atrial fibrillation, aortic atherosclerosis by CT imaging, RBBB, mitral valve prolapse with moderate to severe MR, HLD, arthritis, and PAF on Eliquis  who was seen 10/24/2021 for the evaluation of afib with RVR  after presenting with symptoms of palpitations with  dyspnea .history as well of severe MVP with moderate to severe MR. This was reviewed with Dr. 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 He was not confident that she would do well with Clip. She was admitted at [facility] from 10/21 to 10/29/21. She was treated with amiodarone  gtts and had a successful cardioversion 10/25. She is on Eliquis  2.5 mg BID.  When she saw Dr. Anner 10/30, she was back in afib. He increased amio load and planned on repeat cardioversion 11/3 and clinic was asked to see pt today prior to cardioversion. Her fluid status is stable. She is tolerating higher doses of amiodarone  well. Her neighbor is with her today. She appears to feel well today and lives independently and up to a few weeks ago was driving.   F/u in afib clinic, 11/109/23. She had a successful cardioversion and remains in SR. She feels improved. She will lower amiodarone  to 200 mg daily starting tomorrow. She is tolerating amiodarone  well.   Pt is back in afib clinic, 01/06/22 for c/o of feeling short  of breath. Her amiodarone  has been reduced to 100 mg daily since end of December. Her ekg shows sinus rhythm. She states that she feels fine sitting or lying down but short of breath with exertion. Her weight is down, no fluid observed. PO 94% on RA. No recent illness or respiratory  symptoms.   Follow up 10/26/23. Patient seen in ED on 9/30 for dizziness and heart racing found to be in Afib with  RVR. Patient discharged in Afib. She is taking amiodarone  100 mg daily. She feels sleepy and unwell when out of rhythm. No missed doses of Eliquis  2.5 mg BID.   Follow up 11/16/23, patient is currently in Afib. She began amiodarone  load at last office visit. No missed doses of Eliquis . She does note to feel very tired and have no energy when out of rhythm.   Follow-up 12/22/2023.  Patient is currently in NSR.  S/p successful DCCV on 11/25/2023.  Patient is taking amiodarone  200 mg daily.  She is feeling better since cardioversion.  No missed doses of Eliquis .  Today, she denies symptoms of palpitations, chest pain, shortness of breath, orthopnea, PND, lower extremity edema, dizziness, presyncope, syncope, snoring, daytime somnolence, bleeding, or neurologic sequela. The patient is tolerating medications without difficulties and is otherwise without complaint today.    Atrial Fibrillation Risk Factors:  she does not have symptoms or diagnosis of sleep apnea.  she does not have a history of rheumatic fever.  she does not have a history of alcohol use.  she has a BMI of Body mass index is 17.63 kg/m.SABRA Filed Weights   12/22/23 1005  Weight: 43.7 kg     Atrial Fibrillation Management history:  Previous antiarrhythmic drugs: amiodarone    Previous cardioversions: 10/28/21, 11/06/21, 11/25/2023  Previous ablations: none  Anticoagulation history: eliquis  2.5 mg bid    Past Medical History:  Diagnosis Date   Arthritis    Diverticulitis 1995   And 2007   Dysrhythmia  Hyperlipidemia LDL goal <100    Mitral valve prolapse    Bileaflet MVP with moderate MR.   PAF (paroxysmal atrial fibrillation) (HCC) 11/2017   CHA2DS2 VASc score > 3 (Age >77 and female sex), --> Eliquis  & Metoprolol     Past Surgical History:  Procedure Laterality Date   CARDIOVERSION N/A 10/28/2021   Procedure: CARDIOVERSION;  Surgeon: Hobart Powell BRAVO, MD;  Location: [facility] ENDOSCOPY;  Service: Cardiovascular;   Laterality: N/A;   CARDIOVERSION N/A 11/06/2021   Procedure: CARDIOVERSION;  Surgeon: Santo Stanly LABOR, MD;  Location: [facility] ENDOSCOPY;  Service: Cardiovascular;  Laterality: N/A;   CARDIOVERSION N/A 11/25/2023   Procedure: CARDIOVERSION;  Surgeon: Michele Richardson, DO;  Location: [facility] INVASIVE CV LAB;  Service: Cardiovascular;  Laterality: N/A;   CATARACT EXTRACTION, BILATERAL     CHOLECYSTECTOMY N/A 11/24/2021   Procedure: LAPAROSCOPIC CHOLECYSTECTOMY;  Surgeon: Eletha Boas, MD;  Location: [facility] ORS;  Service: General;  Laterality: N/A;   ERCP N/A 11/23/2021   Procedure: ENDOSCOPIC RETROGRADE CHOLANGIOPANCREATOGRAPHY (ERCP);  Surgeon: Saintclair Jasper, MD;  Location: [facility] ENDOSCOPY;  Service: Gastroenterology;  Laterality: N/A;   REMOVAL OF STONES  11/23/2021   Procedure: REMOVAL OF STONES;  Surgeon: Saintclair Jasper, MD;  Location: [facility] ENDOSCOPY;  Service: Gastroenterology;;   [name]  11/23/2021   Procedure: [name];  Surgeon: Saintclair Jasper, MD;  Location: [facility] ENDOSCOPY;  Service: Gastroenterology;;   TEE WITHOUT CARDIOVERSION N/A 10/28/2021   Procedure: TRANSESOPHAGEAL ECHOCARDIOGRAM (TEE) - SYNCHRONIZED DIRECT CURRENT CARDIOVERSION (DCCV);  Surgeon: Hobart Powell BRAVO, MD;  Location: [facility] ENDOSCOPY;  Service: CV;; LVEF 65-70%. No RWMA . Mod LA dilation w/o thrombus. Normal RV, Mod RA dilation. 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 Bileaflet Prolapse w/ Large Post & small Ant Jet.= 3+/Mod MR - no PVV reversal. = DCCV   TRANSTHORACIC ECHOCARDIOGRAM  10/25/2021   Notably he was very elderly woman.  No acute distress.  Just seems worn out.   TRANSTHORACIC ECHOCARDIOGRAM  12/12/2019    EF 60 to 65%.  No R WMA.  Unable to assess diastolic pressures.  Normal RV pressures.  Mild biatrial enlargement.  Moderate aortic calcification - no AS.  Severe BI-Leaflet MVP with Mod-Severe MR    Current Outpatient Medications  Medication Sig Dispense Refill   acetaminophen  (TYLENOL ) 325 MG  tablet Take 2 tablets (650 mg total) by mouth every 4 (four) hours as needed. (Patient taking differently: Take 650 mg by mouth as needed.)     amiodarone  (PACERONE ) 200 MG tablet Take 1 tablet (200 mg total) by mouth daily. 90 tablet 2   apixaban  (ELIQUIS ) 2.5 MG TABS tablet TAKE 1 TABLET TWICE A DAY 180 tablet 1   atorvastatin  (LIPITOR) 40 MG tablet Take 40 mg by mouth at bedtime.     calcium  carbonate (OS-CAL) 600 MG TABS tablet Take 600 mg by mouth daily with breakfast.     clobetasol ointment (TEMOVATE) 0.05 % Apply 1 Application topically daily.     levothyroxine (SYNTHROID) 50 MCG tablet Take 50 mcg by mouth every morning.     metoprolol  succinate (TOPROL -XL) 25 MG 24 hr tablet TAKE 1/2 TABLET(12.5 MG) BY MOUTH AT BEDTIME 45 tablet 3   Multiple Vitamins-Minerals (MULTIVITAMIN PO) Take 1 tablet by mouth daily. Woman 50+     pantoprazole (PROTONIX) 40 MG tablet Take 40 mg by mouth daily.     Polyethyl Glycol-Propyl Glycol (SYSTANE ULTRA) 0.4-0.3 % SOLN Place 1 drop into both eyes daily as needed (Dry eyes).     tacrolimus (PROTOPIC)  0.1 % ointment Apply 1 Application topically 2 (two) times daily.     VITAMIN D , ERGOCALCIFEROL , PO Take 1,000 Units by mouth daily.     No current facility-administered medications for this encounter.    Allergies  Allergen Reactions   Lidocaine  Palpitations and Other (See Comments)    True tachycardia, dizziness, vertigo Shakes, Causes her to cry   Amoxicillin Hives and Itching   Sulfa Antibiotics Hives and Itching   Ciprofloxacin Diarrhea    The patient does not have a history of early familial atrial fibrillation or other arrhythmias.  ROS- All systems are reviewed and negative except as per the HPI above.  Physical Exam: Vitals:   12/22/23 1005  BP: (!) 132/56  Pulse: 64  Weight: 43.7 kg  Height: 5' 2 (1.575 m)    GEN- The patient is well appearing, alert and oriented x 3 today.   Neck - no JVD or carotid bruit noted Lungs- Clear to  ausculation bilaterally, normal work of breathing Heart- Regular rate and rhythm, no murmurs, rubs or gallops, PMI not laterally displaced Extremities- no clubbing, cyanosis, or edema Skin - no rash or ecchymosis noted   Wt Readings from Last 3 Encounters:  12/22/23 43.7 kg  11/25/23 45.4 kg  11/16/23 47 kg    EKG EKG Interpretation Date/Time:  Thursday December 22 2023 10:16:11 EST Ventricular Rate:  64 PR Interval:  262 QRS Duration:  168 QT Interval:  468 QTC Calculation: 482 R Axis:   231  Text Interpretation: Sinus rhythm with sinus arrhythmia with 1st degree A-V block Right bundle branch block Abnormal ECG When compared with ECG of 25-Nov-2023 10:12, Premature atrial complexes are no longer Present Confirmed by Terra Pac ([id number]) on 12/22/2023 10:30:55 AM    Echo 10/25/2021: 1. Left ventricular ejection fraction, by estimation, is 65 to 70%. The  left ventricle has normal function. The left ventricle has no regional  wall motion abnormalities. Left ventricular diastolic function could not  be evaluated.   2. Right ventricular systolic function is normal. The right ventricular  size is normal. There is moderately elevated pulmonary artery systolic  pressure. The estimated right ventricular systolic pressure is 57.7 mmHg.   3. Left atrial size was moderately dilated.   4. Right atrial size was moderately dilated.   5. 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 The posterior  leaflet is dimunitive and there is at least moderate posterior MR. The  mitral valve is myxomatous. Moderate mitral valve regurgitation. There is  severe prolapse of both leaflets  of the mitral valve. Moderate mitral annular calcification.   6. Tricuspid valve regurgitation is moderate.   7. The aortic valve is tricuspid. There is moderate calcification of the  aortic valve. Aortic valve regurgitation is mild to moderate. Aortic valve  sclerosis/calcification is present, without any  evidence of aortic  stenosis.   Epic records are reviewed at length today  CHA2DS2-VASc Score = 4  The patient's score is based upon: CHF History: 0 HTN History: 0 Diabetes History: 0 Stroke History: 0 Vascular Disease History: 1 Age Score: 2 Gender Score: 1       ASSESSMENT AND PLAN: Persistent Atrial Fibrillation (ICD10:  I48.19) The patient's CHA2DS2-VASc score is 4, indicating a 4.8% annual risk of stroke.   S/p successful DCCV on 11/25/2023.  Patient is currently in NSR.  She is happy with overall management so we will not make any changes at this time.  Continue Toprol  12.5 mg nightly.  High risk medication monitoring (ICD10: J342684) Patient requires ongoing monitoring for anti-arrhythmic medication which has the potential to cause life threatening arrhythmias or AV block. Qtc stable. Continue amiodarone  200 mg daily.   Secondary Hypercoagulable State (ICD10:  D68.69) The patient is at significant risk for stroke/thromboembolism based upon her CHA2DS2-VASc Score of 4.  Continue Apixaban  (Eliquis ).  No missed doses of Eliquis .  Dosage of 2.5 mg is correct based on age and weight.    Follow up 6 months for amiodarone  surveillance.   Dorn Heinrich, PA-C Afib clinic [address]. [phone]

## 2023-12-30 ENCOUNTER — Encounter (HOSPITAL_COMMUNITY): Payer: Self-pay

## 2023-12-30 ENCOUNTER — Emergency Department (HOSPITAL_COMMUNITY)

## 2023-12-30 ENCOUNTER — Other Ambulatory Visit: Payer: Self-pay

## 2023-12-30 ENCOUNTER — Inpatient Hospital Stay (HOSPITAL_COMMUNITY)
Admission: EM | Admit: 2023-12-30 | Discharge: 2024-01-04 | DRG: 193 | Disposition: A | Discharge status: Skilled Nursing Facility | Attending: Internal Medicine | Admitting: Internal Medicine

## 2023-12-30 DIAGNOSIS — I440 Atrioventricular block, first degree: Secondary | ICD-10-CM | POA: Diagnosis present

## 2023-12-30 DIAGNOSIS — Z8049 Family history of malignant neoplasm of other genital organs: Secondary | ICD-10-CM

## 2023-12-30 DIAGNOSIS — E43 Unspecified severe protein-calorie malnutrition: Secondary | ICD-10-CM | POA: Diagnosis present

## 2023-12-30 DIAGNOSIS — I081 Rheumatic disorders of both mitral and tricuspid valves: Secondary | ICD-10-CM | POA: Diagnosis present

## 2023-12-30 DIAGNOSIS — W1839XA Other fall on same level, initial encounter: Secondary | ICD-10-CM | POA: Diagnosis present

## 2023-12-30 DIAGNOSIS — E860 Dehydration: Secondary | ICD-10-CM | POA: Diagnosis present

## 2023-12-30 DIAGNOSIS — Z8249 Family history of ischemic heart disease and other diseases of the circulatory system: Secondary | ICD-10-CM

## 2023-12-30 DIAGNOSIS — J101 Influenza due to other identified influenza virus with other respiratory manifestations: Principal | ICD-10-CM | POA: Diagnosis present

## 2023-12-30 DIAGNOSIS — M6282 Rhabdomyolysis: Secondary | ICD-10-CM | POA: Diagnosis present

## 2023-12-30 DIAGNOSIS — E876 Hypokalemia: Secondary | ICD-10-CM | POA: Diagnosis present

## 2023-12-30 DIAGNOSIS — Z7901 Long term (current) use of anticoagulants: Secondary | ICD-10-CM

## 2023-12-30 DIAGNOSIS — R7989 Other specified abnormal findings of blood chemistry: Secondary | ICD-10-CM | POA: Diagnosis present

## 2023-12-30 DIAGNOSIS — I21A1 Myocardial infarction type 2: Secondary | ICD-10-CM | POA: Diagnosis present

## 2023-12-30 DIAGNOSIS — I480 Paroxysmal atrial fibrillation: Secondary | ICD-10-CM | POA: Diagnosis not present

## 2023-12-30 DIAGNOSIS — E039 Hypothyroidism, unspecified: Secondary | ICD-10-CM | POA: Diagnosis present

## 2023-12-30 DIAGNOSIS — R531 Weakness: Secondary | ICD-10-CM

## 2023-12-30 DIAGNOSIS — Z681 Body mass index (BMI) 19 or less, adult: Secondary | ICD-10-CM | POA: Diagnosis not present

## 2023-12-30 DIAGNOSIS — W19XXXA Unspecified fall, initial encounter: Principal | ICD-10-CM | POA: Diagnosis present

## 2023-12-30 DIAGNOSIS — R748 Abnormal levels of other serum enzymes: Secondary | ICD-10-CM

## 2023-12-30 DIAGNOSIS — I452 Bifascicular block: Secondary | ICD-10-CM | POA: Diagnosis present

## 2023-12-30 DIAGNOSIS — Z882 Allergy status to sulfonamides status: Secondary | ICD-10-CM | POA: Diagnosis not present

## 2023-12-30 DIAGNOSIS — E785 Hyperlipidemia, unspecified: Secondary | ICD-10-CM | POA: Diagnosis present

## 2023-12-30 DIAGNOSIS — E872 Acidosis, unspecified: Secondary | ICD-10-CM | POA: Diagnosis present

## 2023-12-30 DIAGNOSIS — Z880 Allergy status to penicillin: Secondary | ICD-10-CM | POA: Diagnosis not present

## 2023-12-30 DIAGNOSIS — I214 Non-ST elevation (NSTEMI) myocardial infarction: Secondary | ICD-10-CM | POA: Diagnosis not present

## 2023-12-30 DIAGNOSIS — I4819 Other persistent atrial fibrillation: Secondary | ICD-10-CM | POA: Diagnosis present

## 2023-12-30 DIAGNOSIS — Z7189 Other specified counseling: Secondary | ICD-10-CM | POA: Diagnosis not present

## 2023-12-30 DIAGNOSIS — Z9049 Acquired absence of other specified parts of digestive tract: Secondary | ICD-10-CM | POA: Diagnosis not present

## 2023-12-30 DIAGNOSIS — Z884 Allergy status to anesthetic agent status: Secondary | ICD-10-CM

## 2023-12-30 DIAGNOSIS — N390 Urinary tract infection, site not specified: Secondary | ICD-10-CM | POA: Diagnosis present

## 2023-12-30 DIAGNOSIS — Z79890 Hormone replacement therapy (postmenopausal): Secondary | ICD-10-CM

## 2023-12-30 DIAGNOSIS — Z515 Encounter for palliative care: Secondary | ICD-10-CM | POA: Diagnosis not present

## 2023-12-30 DIAGNOSIS — Z79899 Other long term (current) drug therapy: Secondary | ICD-10-CM

## 2023-12-30 DIAGNOSIS — Y92008 Other place in unspecified non-institutional (private) residence as the place of occurrence of the external cause: Secondary | ICD-10-CM

## 2023-12-30 DIAGNOSIS — G9341 Metabolic encephalopathy: Secondary | ICD-10-CM | POA: Diagnosis present

## 2023-12-30 DIAGNOSIS — Z881 Allergy status to other antibiotic agents status: Secondary | ICD-10-CM

## 2023-12-30 DIAGNOSIS — R7401 Elevation of levels of liver transaminase levels: Secondary | ICD-10-CM | POA: Diagnosis present

## 2023-12-30 DIAGNOSIS — M6281 Muscle weakness (generalized): Secondary | ICD-10-CM | POA: Diagnosis not present

## 2023-12-30 DIAGNOSIS — Z801 Family history of malignant neoplasm of trachea, bronchus and lung: Secondary | ICD-10-CM

## 2023-12-30 DIAGNOSIS — E871 Hypo-osmolality and hyponatremia: Secondary | ICD-10-CM | POA: Diagnosis present

## 2023-12-30 LAB — CBC
HCT: 45.6 % (ref 36.0–46.0)
Hemoglobin: 14.9 g/dL (ref 12.0–15.0)
MCH: 31.2 pg (ref 26.0–34.0)
MCHC: 32.7 g/dL (ref 30.0–36.0)
MCV: 95.6 fL (ref 80.0–100.0)
Platelets: 177 K/uL (ref 150–400)
RBC: 4.77 MIL/uL (ref 3.87–5.11)
RDW: 14.3 % (ref 11.5–15.5)
WBC: 9.2 K/uL (ref 4.0–10.5)
nRBC: 0 % (ref 0.0–0.2)

## 2023-12-30 LAB — URINALYSIS, ROUTINE W REFLEX MICROSCOPIC
Bacteria, UA: NONE SEEN
Bilirubin Urine: NEGATIVE
Glucose, UA: NEGATIVE mg/dL
Hgb urine dipstick: NEGATIVE
Ketones, ur: 5 mg/dL — AB
Nitrite: NEGATIVE
Protein, ur: 100 mg/dL — AB
Specific Gravity, Urine: 1.015 (ref 1.005–1.030)
pH: 6 (ref 5.0–8.0)

## 2023-12-30 LAB — BASIC METABOLIC PANEL WITH GFR
Anion gap: 12 (ref 5–15)
BUN: 20 mg/dL (ref 8–23)
CO2: 25 mmol/L (ref 22–32)
Calcium: 9.1 mg/dL (ref 8.9–10.3)
Chloride: 101 mmol/L (ref 98–111)
Creatinine, Ser: 0.84 mg/dL (ref 0.44–1.00)
GFR, Estimated: >60 mL/min
Glucose, Bld: 104 mg/dL — ABNORMAL HIGH (ref 70–99)
Potassium: 4.6 mmol/L (ref 3.5–5.1)
Sodium: 138 mmol/L (ref 135–145)

## 2023-12-30 LAB — TROPONIN T, HIGH SENSITIVITY
Troponin T High Sensitivity: 44 ng/L — ABNORMAL HIGH (ref 0–19)
Troponin T High Sensitivity: 135 ng/L (ref 0–19)

## 2023-12-30 LAB — RESP PANEL BY RT-PCR (RSV, FLU A&B, COVID)  RVPGX2
Influenza A by PCR: POSITIVE — AB
Influenza B by PCR: NEGATIVE
Resp Syncytial Virus by PCR: NEGATIVE
SARS Coronavirus 2 by RT PCR: NEGATIVE

## 2023-12-30 LAB — CK: Total CK: 653 U/L — ABNORMAL HIGH (ref 38–234)

## 2023-12-30 MED ORDER — SODIUM CHLORIDE 0.9 % IV BOLUS
500.0000 mL | Freq: Once | INTRAVENOUS | Status: AC
  Administered 2023-12-30: 500 mL via INTRAVENOUS

## 2023-12-30 NOTE — ED Notes (Signed)
 MD made aware of critical troponin level per lab 135

## 2023-12-30 NOTE — ED Notes (Signed)
Pt speaking to son on phone

## 2023-12-30 NOTE — ED Notes (Signed)
 Trauma Response Nurse Documentation  Lisa Lang is a 89 y.o. female arriving to [facility] ED via EMS  On Eliquis  (apixaban ) daily. Trauma was activated as a Level 2 based on the following trauma criteria Elderly patients > 65 with head trauma on anti-coagulation (excluding ASA).  Patient cleared for CT by Dr. Cottie. Pt transported to CT with trauma response nurse present to monitor. RN remained with the patient throughout their absence from the department for clinical observation. GCS 15.  History   Past Medical History:  Diagnosis Date   Arthritis    Diverticulitis 1995   And 2007   Dysrhythmia    Hyperlipidemia LDL goal <100    Mitral valve prolapse    Bileaflet MVP with moderate MR.   PAF (paroxysmal atrial fibrillation) (HCC) 11/2017   CHA2DS2 VASc score > 3 (Age >83 and female sex), --> Eliquis  & Metoprolol       Past Surgical History:  Procedure Laterality Date   CARDIOVERSION N/A 10/28/2021   Procedure: CARDIOVERSION;  Surgeon: Hobart Powell BRAVO, MD;  Location: [facility] ENDOSCOPY;  Service: Cardiovascular;  Laterality: N/A;   CARDIOVERSION N/A 11/06/2021   Procedure: CARDIOVERSION;  Surgeon: Santo Stanly LABOR, MD;  Location: [facility] ENDOSCOPY;  Service: Cardiovascular;  Laterality: N/A;   CARDIOVERSION N/A 11/25/2023   Procedure: CARDIOVERSION;  Surgeon: Michele Richardson, DO;  Location: [facility] INVASIVE CV LAB;  Service: Cardiovascular;  Laterality: N/A;   CATARACT EXTRACTION, BILATERAL     CHOLECYSTECTOMY N/A 11/24/2021   Procedure: LAPAROSCOPIC CHOLECYSTECTOMY;  Surgeon: Eletha Boas, MD;  Location: [facility] ORS;  Service: General;  Laterality: N/A;   ERCP N/A 11/23/2021   Procedure: ENDOSCOPIC RETROGRADE CHOLANGIOPANCREATOGRAPHY (ERCP);  Surgeon: Saintclair Jasper, MD;  Location: [facility] ENDOSCOPY;  Service: Gastroenterology;  Laterality: N/A;   REMOVAL OF STONES  11/23/2021   Procedure: REMOVAL OF STONES;  Surgeon: Saintclair Jasper, MD;  Location: [facility] ENDOSCOPY;  Service: Gastroenterology;;    Lisa Lang  11/23/2021   Procedure: Lisa Lang;  Surgeon: Saintclair Jasper, MD;  Location: [facility] ENDOSCOPY;  Service: Gastroenterology;;   TEE WITHOUT CARDIOVERSION N/A 10/28/2021   Procedure: TRANSESOPHAGEAL ECHOCARDIOGRAM (TEE) - SYNCHRONIZED DIRECT CURRENT CARDIOVERSION (DCCV);  Surgeon: Hobart Powell BRAVO, MD;  Location: [facility] ENDOSCOPY;  Service: CV;; LVEF 65-70%. No RWMA . Mod LA dilation w/o thrombus. Normal RV, Mod RA dilation. 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 Bileaflet Prolapse w/ Large Post & small Ant Jet.= 3+/Mod MR - no PVV reversal. = DCCV   TRANSTHORACIC ECHOCARDIOGRAM  10/25/2021   Notably he was very elderly woman.  No acute distress.  Just seems worn out.   TRANSTHORACIC ECHOCARDIOGRAM  12/12/2019    EF 60 to 65%.  No R WMA.  Unable to assess diastolic pressures.  Normal RV pressures.  Mild biatrial enlargement.  Moderate aortic calcification - no AS.  Severe BI-Leaflet MVP with Mod-Severe MR     Initial Focused Assessment (If applicable, or please see trauma documentation): Patient A&Ox4, GCS 15, PERR 3 Airway intact, bilateral breath sounds  Pulses 2+ Has been sick/congested  CT's Completed:   CT Head and CT C-Spine   Interventions:  IV, labs CXR/PXR CT Head/Cspine/Lspine  Plan for disposition:  Admission to floor   Event Summary: Patient to ED after an unwitnessed fall around 5A, found by neighbor around 2P. Patient confused per neighbor, not at baseline. Imaging was ordered and revealed no traumatic injury. Plans for patient to be admitted for observation, management of rhabdo and possible infection.  Bedside handoff with ED RN  Lisa Lang Lisa Lang  Trauma Response RN  Please call TRN at [phone] for further assistance.

## 2023-12-30 NOTE — ED Provider Notes (Signed)
 " [place] EMERGENCY DEPARTMENT AT [facility] Provider Note   CSN: [id number] Arrival date & time: 12/30/23  1447     Patient presents with: Lisa Lang is a 89 y.o. female on Eliquis  presenting from home with unwitnessed fall that occurred at 5 AM this morning per the patient's report.  She said that her legs buckled and gave out on her and she fell at home and then could not get off the floor.  EMS found the patient on the floor.  Patient was complaining of some pain in her tailbone where it struck the ground.  EMS reports her SpO2 was 86% room air and she was started on 2 L nasal cannula.  They report initial blood pressure of 110 systolic.  Patient is denying headache.  She arrives in a C-spine collar per EMS as a precaution.   HPI     Prior to Admission medications  Medication Sig Start Date End Date Taking? Authorizing Provider  acetaminophen  (TYLENOL ) 325 MG tablet Take 2 tablets (650 mg total) by mouth every 4 (four) hours as needed. Patient taking differently: Take 650 mg by mouth as needed. 11/25/21   [name], MD  amiodarone  (PACERONE ) 200 MG tablet Take 1 tablet (200 mg total) by mouth daily. 12/19/23   Terra Fairy PARAS, PA-C  apixaban  (ELIQUIS ) 2.5 MG TABS tablet TAKE 1 TABLET TWICE A DAY 10/17/23   [name] ORN, MD  atorvastatin  (LIPITOR) 40 MG tablet Take 40 mg by mouth at bedtime. 09/05/17   [provider]  calcium  carbonate (OS-CAL) 600 MG TABS tablet Take 600 mg by mouth daily with breakfast.    [provider]  clobetasol ointment (TEMOVATE) 0.05 % Apply 1 Application topically daily.    [provider]  levothyroxine  (SYNTHROID ) 50 MCG tablet Take 50 mcg by mouth every morning.    [provider]  metoprolol  succinate (TOPROL -XL) 25 MG 24 hr tablet TAKE 1/2 TABLET(12.5 MG) BY MOUTH AT BEDTIME 05/31/23   [name] ORN, MD  Multiple Vitamins-Minerals (MULTIVITAMIN PO) Take 1 tablet by mouth daily. Woman  50+    [provider]  pantoprazole  (PROTONIX ) 40 MG tablet Take 40 mg by mouth daily. 01/11/22   [provider]  Polyethyl Glycol-Propyl Glycol (SYSTANE ULTRA) 0.4-0.3 % SOLN Place 1 drop into both eyes daily as needed (Dry eyes).    [provider]  tacrolimus  (PROTOPIC ) 0.1 % ointment Apply 1 Application topically 2 (two) times daily. 11/23/23   [provider]  VITAMIN D , ERGOCALCIFEROL , PO Take 1,000 Units by mouth daily.    [provider]    Allergies: Lidocaine , Amoxicillin, Sulfa antibiotics, and Ciprofloxacin    Review of Systems  Updated Vital Signs SpO2 (!) 86%   Physical Exam Constitutional:      General: She is not in acute distress. HENT:     Head: Normocephalic and atraumatic.  Eyes:     Conjunctiva/sclera: Conjunctivae normal.     Pupils: Pupils are equal, round, and reactive to light.  Cardiovascular:     Rate and Rhythm: Normal rate and regular rhythm.  Pulmonary:     Effort: Pulmonary effort is normal. No respiratory distress.  Abdominal:     General: There is no distension.     Tenderness: There is no abdominal tenderness.  Musculoskeletal:     Comments: Full range of motion of the bilateral hips and shoulders with no discomfort, no pain with knee range of motion testing,  C-spine collar in place  Skin:    General: Skin is warm and dry.  Neurological:     General: No focal deficit present.     Mental Status: She is alert. Mental status is at baseline.  Psychiatric:        Mood and Affect: Mood normal.        Behavior: Behavior normal.     (all labs ordered are listed, but only abnormal results are displayed) Labs Reviewed - No data to display  EKG: None  Radiology: No results found.   Procedures   Medications Ordered in the ED - No data to display                                  Medical Decision Making Amount and/or Complexity of Data Reviewed Labs: ordered. Radiology:  ordered. ECG/medicine tests: ordered.  Risk Decision regarding hospitalization.   This patient presents to the ED with concern for syncope versus near syncope versus mechanical fall. This involves an extensive number of treatment options, and is a complaint that carries with it a high risk of complications and morbidity.  The differential diagnosis includes arrhythmia versus anemia versus generalized weakness or deconditioning versus orthostatic hypotension versus other  Co-morbidities that complicate the patient evaluation: advanced age  Additional history obtained from EMS  External records from outside source obtained and reviewed including cardio evaluation from 1 week ago, noting the patient has a history of paroxysmal A-fib on Eliquis , severe MVP, moderate to severe MR, right bundle branch block  I ordered and personally interpreted labs.  The pertinent results include: Basic labs unremarkable, no acute anemia, no leukocytosis, no AKI.  CK level is elevated at 653.  Viral testing, troponins and UA pending at signout  I ordered imaging studies including trauma CT imaging and x-rays I independently visualized and interpreted imaging which showed no emergent traumatic injuries or other findings on emergent basis I agree with the radiologist interpretation  The patient was maintained on a cardiac monitor.  I personally viewed and interpreted the cardiac monitored which showed an underlying rhythm of: Regular heart rate  Per my interpretation the patient's ECG shows sinus rhythm with right bundle branch block, no significant changes from prior tracing December 18.    I have reviewed the patients home medicines and have made adjustments as needed  Test Considered: Doubt acute stroke, meningitis, no indication for LP or MRI of the brain at this time.  Doubt acute PE  Dispostion:  The patient was signed out to the afternoon ED provider Dr Tully Bough pending follow up on labs,  attempted ambulation (amb pulse ox requested with RN given EMS report of hypoxia) and disposition per provider.      Final diagnoses:  None    ED Discharge Orders     None          Natalea Sutliff, Donnice PARAS, MD 12/31/23 [phone]  "

## 2023-12-30 NOTE — Progress Notes (Signed)
 Orthopedic Tech Progress Note Patient Details:  Lisa Lang [birth year] [id number]  Patient ID: Lisa Lang, female   DOB: [birth year], 89 y.o.   MRN: [id number] Respond to Level 2 trauma. Not needed [name] 12/30/2023, 2:55 PM

## 2023-12-30 NOTE — ED Notes (Signed)
 Attempt to ambulate pt to BR. Pt unable to sit on edge of bed independently without falling back. Pt unable to stand at this time. Pt no safe for ambulation trial. Pt returned to lying position, urine sample obtained via bed pan s/p peri care. Chucks changed, brief applied, pt repositioned for comfort, remains on continuous cardiac monitoring. MD at bedside to update pt on POC, pt verbalized understanding.

## 2023-12-30 NOTE — ED Provider Notes (Addendum)
 Patient signed out to me by previous provider. Please refer to their note for full HPI.  Briefly this is a 89 year old female who presented after an unwitnessed fall around 5 AM this morning, was found on the ground this afternoon by neighbor.  Patient states that she had fallen asleep on the couch overnight and got up too fast to walk which is what caused her to fall.  Denies any LOC/syncope.  Feels generally weak.  Neighbor reveals that she has been ill over the past couple days.  Initial blood work shows CK in the 600s, slightly elevated troponin T, most likely secondary to elevated CK.  Remainder of the blood work otherwise reassuring.  CT imaging to rule out traumatic injury ordered.   Patient signed out pending official reads of CT imaging.   ED Course: CT imaging shows no acute fracture.  Small fluid bolus ordered for elevated CK, plan for repeat troponin.  Added on urinalysis and respiratory panel.  Nurse attempted to walk the patient and she was so weak she was unable to even stand up from the side of the bed.  This is a drastic change from her baseline.  She is otherwise living independently, high functioning.  Patient will require admission.  Repeat troponin is uptrending to 135.  No active chest pain.  EKG is unchanged for the patient.  Respiratory panel has returned and is positive for influenza A.  Patient is on Eliquis  and compliant, low suspicion for PE at this time.  There was a hypoxic reading in triage but she has been on room air with normal oxygenation on my evaluation.  Patients evaluation and results requires admission for further treatment and care.  Spoke with hospitalist, reviewed patient's ED course and they accept admission.  Patient agrees with admission plan, offers no new complaints and is stable/unchanged at time of admit.    Bari Roxie HERO, DO 12/30/23 1851    Bari Roxie HERO, DO 12/30/23 [phone]

## 2023-12-30 NOTE — H&P (Signed)
 " History and Physical    Patient: Lisa Lang:[id number] DOB: [birth year] DOA: 12/30/2023 DOS: the patient was seen and examined on 12/30/2023 PCP: Alys Schuyler HERO, PA  Patient coming from: Home  Chief Complaint:  Chief Complaint  Patient presents with   Fall   HPI: Lisa Lang is a 89 y.o. female with medical history significant of hyperlipidemia, paroxysmal atrial fibrillation on anticoagulation, mitral valve prolapse, osteoarthritis, who apparently sustained a fall at home.  The fall was unwitnessed.  It happened early in the morning.  The neighbor found patient inside the house in the afternoon and unknown how long she she has been out or if she lost consciousness.  Also not sure if patient hit her head.  Per patient she just felt weak on the left leg and then fell.  On arrival oxygen saturation was 86% on room air.  She is currently on 2 L with better oxygen saturation.  Patient was found to be influenza A positive.  All x-ray showed no evidence of fractures or traumatic injury.  Patient however has rising troponins.  She has been admitted with traumatic fall and possible early rhabdomyolysis.  CPK 653.  Review of Systems: As mentioned in the history of present illness. All other systems reviewed and are negative. Past Medical History:  Diagnosis Date   Arthritis    Diverticulitis 1995   And 2007   Dysrhythmia    Hyperlipidemia LDL goal <100    Mitral valve prolapse    Bileaflet MVP with moderate MR.   PAF (paroxysmal atrial fibrillation) (HCC) 11/2017   CHA2DS2 VASc score > 3 (Age >9 and female sex), --> Eliquis  & Metoprolol     Past Surgical History:  Procedure Laterality Date   CARDIOVERSION N/A 10/28/2021   Procedure: CARDIOVERSION;  Surgeon: Hobart Powell BRAVO, MD;  Location: [facility] ENDOSCOPY;  Service: Cardiovascular;  Laterality: N/A;   CARDIOVERSION N/A 11/06/2021   Procedure: CARDIOVERSION;  Surgeon: Santo Stanly LABOR, MD;  Location: [facility] ENDOSCOPY;  Service:  Cardiovascular;  Laterality: N/A;   CARDIOVERSION N/A 11/25/2023   Procedure: CARDIOVERSION;  Surgeon: Michele Richardson, DO;  Location: [facility] INVASIVE CV LAB;  Service: Cardiovascular;  Laterality: N/A;   CATARACT EXTRACTION, BILATERAL     CHOLECYSTECTOMY N/A 11/24/2021   Procedure: LAPAROSCOPIC CHOLECYSTECTOMY;  Surgeon: Eletha Boas, MD;  Location: [facility] ORS;  Service: General;  Laterality: N/A;   ERCP N/A 11/23/2021   Procedure: ENDOSCOPIC RETROGRADE CHOLANGIOPANCREATOGRAPHY (ERCP);  Surgeon: Saintclair Jasper, MD;  Location: [facility] ENDOSCOPY;  Service: Gastroenterology;  Laterality: N/A;   REMOVAL OF STONES  11/23/2021   Procedure: REMOVAL OF STONES;  Surgeon: Saintclair Jasper, MD;  Location: [facility] ENDOSCOPY;  Service: Gastroenterology;;   [name]  11/23/2021   Procedure: [name];  Surgeon: Saintclair Jasper, MD;  Location: [facility] ENDOSCOPY;  Service: Gastroenterology;;   TEE WITHOUT CARDIOVERSION N/A 10/28/2021   Procedure: TRANSESOPHAGEAL ECHOCARDIOGRAM (TEE) - SYNCHRONIZED DIRECT CURRENT CARDIOVERSION (DCCV);  Surgeon: Hobart Powell BRAVO, MD;  Location: [facility] ENDOSCOPY;  Service: CV;; LVEF 65-70%. No RWMA . Mod LA dilation w/o thrombus. Normal RV, Mod RA dilation. 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 Bileaflet Prolapse w/ Large Post & small Ant Jet.= 3+/Mod MR - no PVV reversal. = DCCV   TRANSTHORACIC ECHOCARDIOGRAM  10/25/2021   Notably he was very elderly woman.  No acute distress.  Just seems worn out.   TRANSTHORACIC ECHOCARDIOGRAM  12/12/2019    EF 60 to 65%.  No R WMA.  Unable to assess diastolic pressures.  Normal RV pressures.  Mild biatrial enlargement.  Moderate aortic calcification - no AS.  Severe BI-Leaflet MVP with Mod-Severe MR   Social History:  reports that she has never smoked. She has never used smokeless tobacco. She reports that she does not drink alcohol and does not use drugs.  Allergies[1]  Family History  Problem Relation Age of Onset   Cervical cancer Mother    CAD  Father    Lung cancer Sister    Stroke Neg Hx    Diabetes Neg Hx     Prior to Admission medications  Medication Sig Start Date End Date Taking? Authorizing Provider  acetaminophen  (TYLENOL ) 325 MG tablet Take 2 tablets (650 mg total) by mouth every 4 (four) hours as needed. Patient taking differently: Take 650 mg by mouth as needed. 11/25/21   [name], MD  amiodarone  (PACERONE ) 200 MG tablet Take 1 tablet (200 mg total) by mouth daily. 12/19/23   Terra Fairy PARAS, PA-C  apixaban  (ELIQUIS ) 2.5 MG TABS tablet TAKE 1 TABLET TWICE A DAY 10/17/23   [name] ORN, MD  atorvastatin  (LIPITOR) 40 MG tablet Take 40 mg by mouth at bedtime. 09/05/17   [provider]  calcium  carbonate (OS-CAL) 600 MG TABS tablet Take 600 mg by mouth daily with breakfast.    [provider]  clobetasol ointment (TEMOVATE) 0.05 % Apply 1 Application topically daily.    [provider]  levothyroxine  (SYNTHROID ) 50 MCG tablet Take 50 mcg by mouth every morning.    [provider]  metoprolol  succinate (TOPROL -XL) 25 MG 24 hr tablet TAKE 1/2 TABLET(12.5 MG) BY MOUTH AT BEDTIME 05/31/23   [name] ORN, MD  Multiple Vitamins-Minerals (MULTIVITAMIN PO) Take 1 tablet by mouth daily. Woman 50+    [provider]  pantoprazole  (PROTONIX ) 40 MG tablet Take 40 mg by mouth daily. 01/11/22   [provider]  Polyethyl Glycol-Propyl Glycol (SYSTANE ULTRA) 0.4-0.3 % SOLN Place 1 drop into both eyes daily as needed (Dry eyes).    [provider]  tacrolimus (PROTOPIC) 0.1 % ointment Apply 1 Application topically 2 (two) times daily. 11/23/23   [provider]  VITAMIN D , ERGOCALCIFEROL , PO Take 1,000 Units by mouth daily.    [provider]    Physical Exam: Vitals:   12/30/23 1451 12/30/23 1458 12/30/23 1630 12/30/23 1818  BP: (!) 142/71  138/80 (!) 126/56  Pulse: 84  100 74  Resp: 15  16   Temp: 97.8 F (36.6 C)     TempSrc: Oral      SpO2: 99%  98% 96%  Weight:  43.1 kg    Height:  5' 2 (1.575 m)     Constitutional: Chronically ill looking, mildly confused NAD, calm, comfortable Eyes: PERRL, lids and conjunctivae normal ENMT: Mucous membranes are dry. Posterior pharynx clear of any exudate or lesions.Normal dentition.  Neck: normal, supple, no masses, no thyromegaly Respiratory: clear to auscultation bilaterally, no wheezing, no crackles. Normal respiratory effort. No accessory muscle use.  Cardiovascular: Sinus tachycardia, irregular, no murmurs / rubs / gallops. No extremity edema. 2+ pedal pulses. No carotid bruits.  Abdomen: no tenderness, no masses palpated. No hepatosplenomegaly. Bowel sounds positive.  Musculoskeletal: Good range of motion, no joint swelling or tenderness, Skin: no rashes, lesions, ulcers. No induration Neurologic: CN 2-12 grossly intact. Sensation intact, DTR normal. Strength 5/5 in all 4.  Psychiatric: Confused, chronically ill looking,. Normal mood  Data Reviewed:  Temperature 98.7, blood pressure 142/71, pulse 100,  respirate of 16 oxygen sat 86% on room air.  Glucose 104, CPK 653, initial troponin 44 and then 135.  CBC within normal.  Acute viral screen positive for influenza A.  Urinalysis essentially negative.  Chest x-ray showed no acute findings.  X-ray of the pelvis showed no acute findings.  CT of the lumbar spine, cervical spine and CT head showed no acute findings.  Assessment and Plan:  #1 status post fall: Most likely secondary to generalized weakness.  Patient is flu a positive.  Probably gait abnormalities as well.  Admit the patient.  Pain control.  PT OT consultation.  Hydrate.  No fractures.  #2 influenza A infection: May be the cause of most of patient's symptoms.  Initiate Tamiflu and hydration.  Supportive care.  #3 elevated troponins: Cycle troponins.  Consider echocardiogram.  Most likely due to patient's falls and early rhabdo.  CPK however is not very high.  We will  still consider cardiac workup.  #4 hyperlipidemia: Continue with statin  #5 paroxysmal atrial fibrillation: We will need to review patient's use of anticoagulation.  At this point she had mild RVR.  Patient on amiodarone  and Eliquis .  #6 hypothyroidism: Continue levothyroxine   #7 hyperlipidemia: On statin.  May be responsible for elevated CPK.  Continue to monitor.    Advance Care Planning:   Code Status: Full Code   Consults: None  Family Communication: Patient's son over the phone  Severity of Illness: The appropriate patient status for this patient is INPATIENT. Inpatient status is judged to be reasonable and necessary in order to provide the required intensity of service to ensure the patient's safety. The patient's presenting symptoms, physical exam findings, and initial radiographic and laboratory data in the context of their chronic comorbidities is felt to place them at high risk for further clinical deterioration. Furthermore, it is not anticipated that the patient will be medically stable for discharge from the hospital within 2 midnights of admission.   * I certify that at the point of admission it is my clinical judgment that the patient will require inpatient hospital care spanning beyond 2 midnights from the point of admission due to high intensity of service, high risk for further deterioration and high frequency of surveillance required.*  AuthorBETHA SIM KNOLL, MD 12/30/2023 7:07 PM  For on call review [email or web address].      [1]  Allergies Allergen Reactions   Lidocaine  Palpitations and Other (See Comments)    True tachycardia, dizziness, vertigo Shakes, Causes her to cry   Amoxicillin Hives and Itching   Sulfa Antibiotics Hives and Itching   Ciprofloxacin Diarrhea   "

## 2023-12-30 NOTE — ED Triage Notes (Addendum)
 Pt BIBA from home, per EMS pt had unwitnessed fall around 0500 this AM. Neighbor found pt inside her house this afternoon. Unknown if pt his head, unknown LOC. No obvious injury or deformity. Pt AAO on arrival in NAD. Pt is on blood thinners. Pt reports she fell because her left leg didn't want to work. EMS reports pt was 86% spO2 on their arrival on RA and was placed on 2 L [place] w/ good improvement. Pt 98% on RA on arrival

## 2023-12-31 ENCOUNTER — Inpatient Hospital Stay (HOSPITAL_COMMUNITY)

## 2023-12-31 DIAGNOSIS — R7989 Other specified abnormal findings of blood chemistry: Secondary | ICD-10-CM | POA: Diagnosis not present

## 2023-12-31 DIAGNOSIS — Z7189 Other specified counseling: Secondary | ICD-10-CM | POA: Diagnosis not present

## 2023-12-31 DIAGNOSIS — I214 Non-ST elevation (NSTEMI) myocardial infarction: Secondary | ICD-10-CM | POA: Diagnosis not present

## 2023-12-31 DIAGNOSIS — Z515 Encounter for palliative care: Secondary | ICD-10-CM

## 2023-12-31 DIAGNOSIS — R531 Weakness: Secondary | ICD-10-CM | POA: Diagnosis not present

## 2023-12-31 LAB — COMPREHENSIVE METABOLIC PANEL WITH GFR
ALT: 209 U/L — ABNORMAL HIGH (ref 0–44)
AST: 232 U/L — ABNORMAL HIGH (ref 15–41)
Albumin: 3.2 g/dL — ABNORMAL LOW (ref 3.5–5.0)
Alkaline Phosphatase: 103 U/L (ref 38–126)
Anion gap: 14 (ref 5–15)
BUN: 24 mg/dL — ABNORMAL HIGH (ref 8–23)
CO2: 20 mmol/L — ABNORMAL LOW (ref 22–32)
Calcium: 9 mg/dL (ref 8.9–10.3)
Chloride: 100 mmol/L (ref 98–111)
Creatinine, Ser: 0.8 mg/dL (ref 0.44–1.00)
GFR, Estimated: >60 mL/min
Glucose, Bld: 86 mg/dL (ref 70–99)
Potassium: 4 mmol/L (ref 3.5–5.1)
Sodium: 134 mmol/L — ABNORMAL LOW (ref 135–145)
Total Bilirubin: 1.4 mg/dL — ABNORMAL HIGH (ref 0.0–1.2)
Total Protein: 5.6 g/dL — ABNORMAL LOW (ref 6.5–8.1)

## 2023-12-31 LAB — VITAMIN D 25 HYDROXY (VIT D DEFICIENCY, FRACTURES): Vit D, 25-Hydroxy: 41 ng/mL (ref 30–100)

## 2023-12-31 LAB — ECHOCARDIOGRAM COMPLETE
AR max vel: 1.33 cm2
AV Area VTI: 1.59 cm2
AV Area mean vel: 1.6 cm2
AV Mean grad: 16 mmHg
AV Peak grad: 25.4 mmHg
Ao pk vel: 2.52 m/s
Area-P 1/2: 3.89 cm2
Height: 62 in
S' Lateral: 1.9 cm
Weight: 1520 [oz_av]

## 2023-12-31 LAB — TSH: TSH: 4.39 u[IU]/mL (ref 0.350–4.500)

## 2023-12-31 LAB — CBC
HCT: 41.2 % (ref 36.0–46.0)
Hemoglobin: 13.7 g/dL (ref 12.0–15.0)
MCH: 31.1 pg (ref 26.0–34.0)
MCHC: 33.3 g/dL (ref 30.0–36.0)
MCV: 93.6 fL (ref 80.0–100.0)
Platelets: 168 K/uL (ref 150–400)
RBC: 4.4 MIL/uL (ref 3.87–5.11)
RDW: 14.3 % (ref 11.5–15.5)
WBC: 10.1 K/uL (ref 4.0–10.5)
nRBC: 0 % (ref 0.0–0.2)

## 2023-12-31 LAB — TROPONIN T, HIGH SENSITIVITY: Troponin T High Sensitivity: 207 ng/L (ref 0–19)

## 2023-12-31 LAB — CK: Total CK: 679 U/L — ABNORMAL HIGH (ref 38–234)

## 2023-12-31 LAB — VITAMIN B12: Vitamin B-12: 1454 pg/mL — ABNORMAL HIGH (ref 180–914)

## 2023-12-31 MED ORDER — LEVOTHYROXINE SODIUM 50 MCG PO TABS
50.0000 ug | ORAL_TABLET | Freq: Every day | ORAL | Status: DC
  Administered 2023-12-31 – 2024-01-04 (×5): 50 ug via ORAL
  Filled 2023-12-31 (×2): qty 1

## 2023-12-31 MED ORDER — TACROLIMUS 0.1 % EX OINT: 1.0000 | TOPICAL_OINTMENT | Freq: Two times a day (BID) | CUTANEOUS | Status: DC

## 2023-12-31 MED ORDER — OSELTAMIVIR PHOSPHATE 30 MG PO CAPS
30.0000 mg | ORAL_CAPSULE | Freq: Every day | ORAL | Status: AC
  Administered 2023-12-31 – 2024-01-04 (×5): 30 mg via ORAL
  Filled 2023-12-31 (×3): qty 1

## 2023-12-31 MED ORDER — ATORVASTATIN CALCIUM 40 MG PO TABS
40.0000 mg | ORAL_TABLET | Freq: Every day | ORAL | Status: DC
  Administered 2023-12-31: 40 mg via ORAL
  Filled 2023-12-31: qty 1

## 2023-12-31 MED ORDER — METOPROLOL SUCCINATE ER 25 MG PO TB24
12.5000 mg | ORAL_TABLET | Freq: Every day | ORAL | Status: DC
  Administered 2023-12-31: 12.5 mg via ORAL
  Filled 2023-12-31: qty 1

## 2023-12-31 MED ORDER — LACTATED RINGERS IV SOLN: INTRAVENOUS | Status: AC

## 2023-12-31 MED ORDER — POLYVINYL ALCOHOL 1.4 % OP SOLN: 1.0000 [drp] | Freq: Every day | OPHTHALMIC | Status: DC | PRN

## 2023-12-31 MED ORDER — CALCIUM CARBONATE 1250 (500 CA) MG PO TABS
1250.0000 mg | ORAL_TABLET | Freq: Every day | ORAL | Status: DC
  Administered 2023-12-31 – 2024-01-04 (×5): 1250 mg via ORAL
  Filled 2023-12-31 (×2): qty 1

## 2023-12-31 MED ORDER — LACTATED RINGERS IV SOLN: INTRAVENOUS | Status: DC

## 2023-12-31 MED ORDER — APIXABAN 2.5 MG PO TABS
2.5000 mg | ORAL_TABLET | Freq: Two times a day (BID) | ORAL | Status: DC
  Administered 2023-12-31 – 2024-01-04 (×10): 2.5 mg via ORAL
  Filled 2023-12-31 (×5): qty 1

## 2023-12-31 MED ORDER — PANTOPRAZOLE SODIUM 40 MG PO TBEC
40.0000 mg | DELAYED_RELEASE_TABLET | Freq: Every day | ORAL | Status: DC
  Administered 2023-12-31 – 2024-01-04 (×5): 40 mg via ORAL
  Filled 2023-12-31 (×2): qty 1

## 2023-12-31 MED ORDER — MORPHINE SULFATE (PF) 2 MG/ML IV SOLN
2.0000 mg | INTRAVENOUS | Status: DC | PRN
  Administered 2023-12-31: 2 mg via INTRAVENOUS
  Filled 2023-12-31: qty 1

## 2023-12-31 MED ORDER — ACETAMINOPHEN 325 MG PO TABS: 650.0000 mg | ORAL_TABLET | Freq: Four times a day (QID) | ORAL | Status: DC | PRN

## 2023-12-31 MED ORDER — ONDANSETRON HCL 4 MG PO TABS: 4.0000 mg | ORAL_TABLET | Freq: Four times a day (QID) | ORAL | Status: DC | PRN

## 2023-12-31 MED ORDER — ACETAMINOPHEN 650 MG RE SUPP: 650.0000 mg | Freq: Four times a day (QID) | RECTAL | Status: DC | PRN

## 2023-12-31 MED ORDER — AMIODARONE HCL 200 MG PO TABS
200.0000 mg | ORAL_TABLET | Freq: Every day | ORAL | Status: DC
  Administered 2023-12-31 – 2024-01-04 (×5): 200 mg via ORAL
  Filled 2023-12-31 (×2): qty 1

## 2023-12-31 MED ORDER — SODIUM CHLORIDE 0.9 % IV SOLN
1.0000 g | INTRAVENOUS | Status: DC
  Administered 2023-12-31 – 2024-01-03 (×4): 1 g via INTRAVENOUS
  Filled 2023-12-31 (×2): qty 10

## 2023-12-31 MED ORDER — ONDANSETRON HCL 4 MG/2ML IJ SOLN: 4.0000 mg | Freq: Four times a day (QID) | INTRAMUSCULAR | Status: DC | PRN

## 2023-12-31 NOTE — Consult Note (Signed)
 "   Cardiology  Consult:   Patient ID: Lisa Lang; MRN: [id number]; DOB: 02/09/1927   Admission date: 12/30/2023  Primary Care Provider: Alys Schuyler HERO, PA Primary Cardiologist: Dr. Anner   Chief Complaint:  Troponin elevation  History of Present Illness:   Lisa Lang is a with mitral valve prolapse and paroxysmal atrial fibrillation who presents after an unwitnessed fall and influenza A infection. She is accompanied by her niece.  She experienced an unwitnessed fall at home, discovered by a neighbor in the afternoon. It is unclear how long she was on the floor or if she lost consciousness. She attempted to get up too quickly, causing her legs to buckle, leading to the fall. No chest pain, chest pressure, or shortness of breath was noted during the evaluation.  She has a history of mitral valve prolapse with an abnormal mitral valve and mild mitral regurgitation, as well as aortic valve regurgitation. An echocardiogram performed earlier today showed normal function, an abnormal mitral valve, moderate tricuspid regurgitation, and non-rheumatic aortic regurgitation.  She has paroxysmal atrial fibrillation and is on anticoagulation therapy. She takes amiodarone  1200 mg daily and was previously on metoprolol  12.5 mg daily, which has been discontinued.  She was found to have early rhabdomyolysis with a CPK of 653, which has increased to 679. Her high sensitivity troponin T has risen from 44 to 135 to 207. She is experiencing transaminitis with elevated AST and ALT levels of 232 and 209, respectively. Atorvastatin  40 mg PO daily has been held until her transaminitis resolves.  She has been experiencing some confusion. Her niece reports no shortness of breath, chest pain, or palpitations at baseline.  Allergies:   Allergies[1]  Social History:   Social History   Socioeconomic History   Marital status: Married    Spouse name: Not on file   Number of children: Not on file   Years of  education: Not on file   Highest education level: Not on file  Occupational History   Occupation: Retired  Tobacco Use   Smoking status: Never   Smokeless tobacco: Never   Tobacco comments:    Never smoked 10/26/23  Vaping Use   Vaping status: Never Used  Substance and Sexual Activity   Alcohol  use: No   Drug use: No   Sexual activity: Not on file  Other Topics Concern   Not on file  Social History Narrative   Recently widowed November 2019--she had been the primary caregiver for her husband who had progressively worsening dementia.   Now lives alone and is planning to move into a retirement community potentially.  She handled his death well, and felt somewhat liberated having the stress of being caregiver removed.   Social Drivers of Health   Tobacco Use: Low Risk (12/30/2023)   Patient History    Smoking Tobacco Use: Never    Smokeless Tobacco Use: Never    Passive Exposure: Not on file  Financial Resource Strain: Not on file  Food Insecurity: No Food Insecurity (12/31/2023)   Epic    Worried About Programme Researcher, Broadcasting/film/video in the Last Year: Never true    Ran Out of Food in the Last Year: Never true  Transportation Needs: No Transportation Needs (12/31/2023)   Epic    Lack of Transportation (Medical): No    Lack of Transportation (Non-Medical): No  Physical Activity: Not on file  Stress: Not on file  Social Connections: Socially Isolated (12/31/2023)   Social Connection and Isolation Panel  Frequency of Communication with Friends and Family: Once a week    Frequency of Social Gatherings with Friends and Family: Once a week    Attends Religious Services: Never    Database Administrator or Organizations: No    Attends Banker Meetings: Never    Marital Status: Widowed  Intimate Partner Violence: Not At Risk (12/31/2023)   Epic    Fear of Current or Ex-Partner: No    Emotionally Abused: No    Physically Abused: No    Sexually Abused: No  Depression  (PHQ2-9): Not on file  Alcohol  Screen: Not on file  Housing: Low Risk (12/31/2023)   Epic    Unable to Pay for Housing in the Last Year: No    Number of Times Moved in the Last Year: 0    Homeless in the Last Year: No  Utilities: Not At Risk (12/31/2023)   Epic    Threatened with loss of utilities: No  Health Literacy: Not on file    Family History:   The patient's family history includes CAD in her father; Cervical cancer in her mother; Lung cancer in her sister. There is no history of Stroke or Diabetes.    ROS:  Please see the history of present illness.   Physical Exam/Data:   Vitals:   12/30/23 2101 12/30/23 2352 12/31/23 0538 12/31/23 0801  BP: (!) 119/59 111/62 (!) 133/57 (!) 120/53  Pulse: 71 85 82 80  Resp: 16 20 14 16   Temp:  (!) 97.5 F (36.4 C) 98.4 F (36.9 C) 98.1 F (36.7 C)  TempSrc:  Oral Oral Oral  SpO2: 95% 97% 92% 94%  Weight:      Height:        Intake/Output Summary (Last 24 hours) at 12/31/2023 1254 Last data filed at 12/31/2023 0500 Gross per 24 hour  Intake 146 ml  Output --  Net 146 ml   Filed Weights   12/30/23 1458  Weight: 43.1 kg   Body mass index is 17.38 kg/m.   Gen: no distress, elderly female   Neck: No JVD Cardiac: No Rubs or Gallops, systolic and diastolic murmurs,RRR +2 Respiratory: Clear to auscultation bilaterally, normal effort, normal  respiratory rate GI: Soft, nontender, non-distended  MS: No  edema;  moves all extremities Integument: Skin feels warm Neuro:  At time of evaluation, alert and oriented to person/place/time/situation at this time Psych: Normal affect, patient feels ok    EKG:  The ECG that was done  was personally reviewed and demonstrates SR with FAV and RBBB (BIFB)  Relevant CV Studies:  Cardiac Studies & Procedures   ______________________________________________________________________________________________     ECHOCARDIOGRAM  ECHOCARDIOGRAM COMPLETE  12/31/2023  Narrative ECHOCARDIOGRAM REPORT    Patient Name:   Lisa Lang Date of Exam: 12/31/2023 Medical Rec #:  [id number]     Height:       62.0 in Accession #:    [id number]    Weight:       95.0 lb Date of Birth:  [birth year]      BSA:          1.393 m Patient Age:    89 years      BP:           120/53 mmHg Patient Gender: F             HR:           75 bpm. Exam Location:  Inpatient  Procedure: 2D Echo,  Cardiac Doppler and Color Doppler (Both Spectral and Color Flow Doppler were utilized during procedure).  Indications:    Elevated Troponin  History:        Patient has prior history of Echocardiogram examinations, most recent 10/25/2021. Arrythmias:Atrial Fibrillation; Risk Factors:Dyslipidemia.  Sonographer:    Philomena Daring Referring Phys: EMERY LITTIE FUSS  IMPRESSIONS   1. Left ventricular ejection fraction, by estimation, is 65 to 70%. The left ventricle has normal function. The left ventricle has no regional wall motion abnormalities. Left ventricular diastolic parameters are indeterminate. 2. Right ventricular systolic function is normal. The right ventricular size is normal. 3. Left atrial size was severely dilated. 4. Diastolic doming and thickening of the leaflet tips with anterior valve prolapse. Cannot exclude small annular disjuction. The mitral valve is abnormal. Mild mitral valve regurgitation. No evidence of mitral stenosis. 5. Multiple jets of eccentric tricuspid regurgitation; tricuspid prolapse. The tricuspid valve is abnormal. Tricuspid valve regurgitation is moderate. 6. The aortic valve was not well visualized. Aortic valve regurgitation is mild to moderate. Mild aortic valve stenosis. Aortic valve mean gradient measures 16.0 mmHg. Aortic valve Vmax measures 2.52 m/s.  Comparison(s): Prior images reviewed side by side. Mild increase in aortic valve gradients.  FINDINGS Left Ventricle: Left ventricular ejection fraction, by estimation, is 65 to 70%.  The left ventricle has normal function. The left ventricle has no regional wall motion abnormalities. The left ventricular internal cavity size was small. There is no left ventricular hypertrophy. Left ventricular diastolic parameters are indeterminate.  Right Ventricle: The right ventricular size is normal. No increase in right ventricular wall thickness. Right ventricular systolic function is normal.  Left Atrium: Left atrial size was severely dilated.  Right Atrium: Right atrial size was normal in size.  Pericardium: There is no evidence of pericardial effusion.  Mitral Valve: Diastolic doming and thickening of the leaflet tips with anterior valve prolapse. Cannot exclude small annular disjuction. The mitral valve is abnormal. Mild mitral valve regurgitation. No evidence of mitral valve stenosis.  Tricuspid Valve: Multiple jets of eccentric tricuspid regurgitation; tricuspid prolapse. The tricuspid valve is abnormal. Tricuspid valve regurgitation is moderate.  Aortic Valve: The aortic valve was not well visualized. Aortic valve regurgitation is mild to moderate. Mild aortic stenosis is present. Aortic valve mean gradient measures 16.0 mmHg. Aortic valve peak gradient measures 25.4 mmHg. Aortic valve area, by VTI measures 1.59 cm.  Pulmonic Valve: The pulmonic valve was normal in structure. Pulmonic valve regurgitation is mild. No evidence of pulmonic stenosis.  Aorta: The aortic root and ascending aorta are structurally normal, with no evidence of dilitation.  IAS/Shunts: No atrial level shunt detected by color flow Doppler.   LEFT VENTRICLE PLAX 2D LVIDd:         3.10 cm   Diastology LVIDs:         1.90 cm   LV e' medial:    4.35 cm/s LV PW:         1.00 cm   LV E/e' medial:  21.2 LV IVS:        1.00 cm   LV e' lateral:   5.87 cm/s LVOT diam:     2.10 cm   LV E/e' lateral: 15.7 LV SV:         65 LV SV Index:   47 LVOT Area:     3.46 cm   RIGHT VENTRICLE             IVC RV  Basal diam:  3.50 cm  IVC diam: 1.60 cm RV Mid diam:    2.40 cm RV S prime:     14.50 cm/s TAPSE (M-mode): 2.5 cm  LEFT ATRIUM             Index        RIGHT ATRIUM           Index LA diam:        2.50 cm 1.79 cm/m   RA Area:     17.20 cm LA Vol (A2C):   63.4 ml 45.54 ml/m  RA Volume:   43.50 ml  31.22 ml/m LA Vol (A4C):   69.8 ml 50.10 ml/m LA Biplane Vol: 75.8 ml 54.41 ml/m AORTIC VALVE AV Area (Vmax):    1.33 cm AV Area (Vmean):   1.60 cm AV Area (VTI):     1.59 cm AV Vmax:           252.00 cm/s AV Vmean:          146.800 cm/s AV VTI:            0.407 m AV Peak Grad:      25.4 mmHg AV Mean Grad:      16.0 mmHg LVOT Vmax:         96.70 cm/s LVOT Vmean:        67.850 cm/s LVOT VTI:          0.188 m LVOT/AV VTI ratio: 0.46  AORTA Ao Root diam: 2.60 cm Ao Asc diam:  3.00 cm  MITRAL VALVE               TRICUSPID VALVE MV Area (PHT): 3.89 cm    TR Peak grad:   27.5 mmHg MV Decel Time: 195 msec    TR Vmax:        262.00 cm/s MV E velocity: 92.20 cm/s MV A velocity: 88.00 cm/s  SHUNTS MV E/A ratio:  1.05        Systemic VTI:  0.19 m Systemic Diam: 2.10 cm  Stanly Leavens MD Electronically signed by Stanly Leavens MD Signature Date/Time: 12/31/2023/12:16:39 PM    Final   TEE  ECHO TEE 10/28/2021  Narrative TRANSESOPHOGEAL ECHO REPORT    Patient Name:   Lisa Lang Date of Exam: 10/28/2021 Medical Rec #:  [id number]     Height:       62.0 in Accession #:    [id number]    Weight:       108.2 lb Date of Birth:  April 23, 1927      BSA:          1.472 m Patient Age:    89 years      BP:           91/61 mmHg Patient Gender: F             HR:           110 bpm. Exam Location:  Inpatient  Procedure: TEE-Intraopertive, 3D Echo, Cardiac Doppler and Color Doppler  Indications:     mitral regurgitation. atrial fibrillation  History:         Patient has prior history of Echocardiogram examinations, most recent 10/25/2021. Mitral Valve Prolapse;  Risk Factors:Dyslipidemia.  Sonographer:     Tinnie Barefoot RDCS Referring Phys:  [id number] POWELL BRAVO [place] Diagnosing Phys: Powell Sorrow MD  PROCEDURE: After discussion of the risks and benefits of a TEE, an informed consent was obtained from the patient. The transesophogeal probe was passed without difficulty through  the esophogus of the patient. Sedation performed by different physician. The patient was monitored while under deep sedation. Anesthestetic sedation was provided intravenously by Anesthesiology: 178mg  of Propofol . The patient developed no complications during the procedure. A direct current cardioversion was performed.  IMPRESSIONS   1. Left ventricular ejection fraction, by estimation, is 65 to 70%. The left ventricle has normal function. 2. Right ventricular systolic function is normal. The right ventricular size is normal. 3. Left atrial size was moderately dilated. No left atrial/left atrial appendage thrombus was detected. 4. Right atrial size was moderately dilated. 5. 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 Overall, mitral regurgitation appears moderate (EROA of posterior jet 0.2cm2, RVol 21mL). There is no systolic flow reversal in the pulmonary veins. PASP is +RAP. SABRA The mitral valve is myxomatous. Moderate mitral valve regurgitation. 6. Tricuspid valve regurgitation is mild to moderate. 7. The aortic valve is tricuspid. There is mild calcification of the aortic valve. There is mild thickening of the aortic valve. Aortic valve regurgitation is mild. Aortic valve sclerosis/calcification is present, without any evidence of aortic stenosis. 8. Following TEE, the patient underwent successful DCCV with 150J x1.  FINDINGS Left Ventricle:  Left ventricular ejection fraction, by estimation, is 65 to 70%. The left ventricle has normal function. The left ventricular internal cavity size was normal in size.  Right Ventricle: The right ventricular size is normal. No increase in right ventricular wall thickness. Right ventricular systolic function is normal.  Left Atrium: Left atrial size was moderately dilated. No left atrial/left atrial appendage thrombus was detected.  Right Atrium: Right atrial size was moderately dilated.  Pericardium: There is no evidence of pericardial effusion.  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 Overall, mitral regurgitation appears moderate (EROA of posterior jet 0.2cm2, RVol 21mL). There is no systolic flow reversal in the pulmonary veins. PASP is +RAP. The mitral valve is myxomatous. Moderate mitral valve regurgitation.  Tricuspid Valve: The tricuspid valve is normal in structure. Tricuspid valve regurgitation is mild to moderate.  Aortic Valve: The aortic valve is tricuspid. There is mild calcification of the aortic valve. There is mild thickening of the aortic valve. Aortic valve regurgitation is mild. Aortic valve sclerosis/calcification is present, without any evidence of aortic stenosis.  Pulmonic Valve: The pulmonic valve was normal in structure. Pulmonic valve regurgitation is trivial.  Aorta: The aortic root is normal in size and structure.  IAS/Shunts: The atrial septum is grossly normal.   MR Peak grad:    92.5 mmHg MR Mean grad:    55.0 mmHg MR Vmax:         481.00 cm/s MR Vmean:        343.0 cm/s MR PISA:         2.26 cm MR PISA Eff ROA: 18 mm MR PISA Radius:  0.60 cm  Powell Sorrow MD Electronically signed by Powell Sorrow  MD Signature Date/Time: 10/28/2021/11:20:18 AM    Final  MONITORS  LONG TERM MONITOR (3-14 DAYS) 01/26/2022  Narrative Patch Wear Time:  9 days and 5 hours  Predominant rhythm was sinus rhythm Less  than 1% ventricular and supraventricular ectopy No triggered episodes recorded  Will Camnitz, MD       ______________________________________________________________________________________________      Laboratory Data:  Chemistry Recent Labs  Lab 12/30/23 1536 12/31/23 1038  NA 138 134*  K 4.6 4.0  CL 101 100  CO2 25 20*  GLUCOSE 104* 86  BUN 20 24*  CREATININE 0.84 0.80  CALCIUM  9.1 9.0  GFRNONAA >60 >60  ANIONGAP 12 14    Recent Labs  Lab 12/31/23 1038  PROT 5.6*  ALBUMIN 3.2*  AST 232*  ALT 209*  ALKPHOS 103  BILITOT 1.4*   Hematology Recent Labs  Lab 12/30/23 1536 12/31/23 1038  WBC 9.2 10.1  RBC 4.77 4.40  HGB 14.9 13.7  HCT 45.6 41.2  MCV 95.6 93.6  MCH 31.2 31.1  MCHC 32.7 33.3  RDW 14.3 14.3  PLT 177 168      Assessment and Plan:   NSTMI Type 2 myocardial infarction Likely related to mild rhabdomyolysis and influenza A infection. No evidence of plaque rupture or wall motion abnormalities. Asymptomatic with no plan for ischemic evaluation. - Continue anticoagulation for AF  Rhabdomyolysis Early rhabdomyolysis with elevated CPK levels. Likely contributing to type 2 myocardial infarction. - Continue hydration as per TRH  Influenza A infection Confirmed influenza A infection contributing to type 2 myocardial infarction and rhabdomyolysis. - Continue supportive care  Transaminitis (likely drug-induced) Elevated AST and ALT levels, likely drug-induced. Atorvastatin  held until resolution. - Held atorvastatin  until transaminitis resolves, ok to resume at DC  Persistent atrial fibrillation status post cardioversion Persistent atrial fibrillation status post cardioversion in November. Currently in sinus rhythm with prolonged first  degree AV block and right bundle branch block. - Continue amiodarone  - Discontinued metoprolol  in the setting of BIFB  - Monitor on telemetry  Multiple valvular heart disease (mitral valve prolapse with regurgitation, aortic and tricuspid regurgitation) Multiple valvular heart disease with mitral valve prolapse, mild mitral regurgitation, aortic valve regurgitation, and moderate tricuspid regurgitation. No severe valve disease noted. 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 Metoprolol  discontinued due to this finding. - Monitor on telemetry  Discussed with patient and niece  For questions or updates, please contact [facility] Please consult [email or web address] for contact info under Cardiology/STEMI.   Stanly Leavens, MD FASE [facility] Cardiologist [facility]  [address] [place], [address] [place], [place] [address] [phone]  12:54 PM      [1]  Allergies Allergen Reactions   Lidocaine  Palpitations and Other (See Comments)    True tachycardia, dizziness, vertigo Shakes, Causes her to cry   Amoxicillin Hives and Itching   Sulfa Antibiotics Hives and Itching   Ciprofloxacin Diarrhea   "

## 2023-12-31 NOTE — Progress Notes (Signed)
 " PROGRESS NOTE Lisa Lang  FMW:[id number] DOB: [birth year] DOA: 12/30/2023 PCP: Alys Schuyler HERO, PA  Brief Narrative/Hospital Course: Lisa Lang is a 89 y.o. female with PMH of persistent AF on Eliquis  recent DCCV on 11/25/2023, aortic atherosclerosis MVP with moderate to severe MR, arthritis, hyperlipidemia, hypothyroidism presented after an unwitnessed fall around 5 AM on 12/26 and found by neighbor inside her house in the afternoon, EMS was brought with hypoxic 87% on room air and placed on 2 L [place] and improved to 98% Patient reported she has fallen asleep on the couch overnight and got up too fast to work, her legs buckled and gave out and she fell at home and could not get off the floor.,  Patient was complaining about pain in her tailbone. In the ED: Vitals stable, blood work showed slight elevated CK, elevated troponin rest of the labs reassuring.  Patient without chest pain and EKG unchanged, tested positive for influenza A Imaging CT head, C-spine, lumbar spine, x-ray pelvis chest xray >No evidence of trauma no acute intracranial mobility or acute fracture. Patient is unable to stand on her own, patient is admitted for further management  Subjective: Seen and examined today She is alert awake oriented to self, states she is at home  She is having cough dry, lips sores w/ dry mucosa Denies chest pain shortness of breath Overnight afebrile, on room air, VSS, Labs-troponin 44> 135> 207  Assessment and plan:  Unwitnessed fall at home Generalized weakness: Imaging with chest x-ray pelvis x-ray CT lumbar spine C-spine CT head no acute acute traumatic fracture.  Patient with mild rhabdomyolysis. On Eliquis  PTA.  Check vitamin D , TSH B12, continue PT OT  Check orthostasis. She is positive for influenza likley attributed to her weakness and fall.  Acute metabolic encephalopathy Suspected UTI: Appears  confused, but no focal on exam, follows commands  As per son her memory is fine at  baseline, lives independently and fairly mobile. UA abnormal wbc 6-10 bacteria- will do empric rocephin  for now-which she has tolerated in the past in 2023.  Influenza A: Having cough weakness- start tamiflu  -pharmacy to dose.  Continue isolation.  Dehydration Hyponatremia mild Metabolic acidosis: Cont ivf, encourage po  Persistent A-fib: Recent DCCV 11/25/2023, on renally dosed Eliquis , Toprol  and amiodarone  PTA  and resumed  Mild rhabdomyolysis: Trend CK.  Monitor while on statin. Recent Labs  Lab 12/30/23 1536 12/31/23 1038  CKTOTAL 653* 679*    HLD: on statins.  Elevated troponin: Patient without ischemic change in the EKG and ahs no chest pain.  Recheck troponin again up will consulted cardio- echocardiogram has been ordered.  Severe malnutrition with Body mass index is 17.38 kg/m.: Add supplements.  Consult RD  Mild transaminitis- Suspect from rhabdo. S/p ERCP and laparoscopic cholecystectomy in 2023  GOC: Consult palliative care per son she has a living will and he will fax it and she wishes for natural death She has been full code in the past. I updated son this am.he is in [place]. Niece will be visiting today.  Mobility: PT Orders: Active PT Follow up Rec: Skilled Nursing-Short Term Rehab (<3 Hours/Day)12/31/2023 1000   DVT prophylaxis: apixaban  (ELIQUIS ) tablet 2.5 mg Start: 12/31/23 0100 Code Status:   Code Status: Full Code Family Communication: plan of care discussed with patient at bedside. Called her son to update. And met niece at bedside. Patient status is: Remains hospitalized because of severity of illness Level of care: Telemetry   Dispo: The patient is  from: home aone            Anticipated disposition: TBD Objective: Vitals last 24 hrs: Vitals:   12/30/23 2101 12/30/23 2352 12/31/23 0538 12/31/23 0801  BP: (!) 119/59 111/62 (!) 133/57 (!) 120/53  Pulse: 71 85 82 80  Resp: 16 20 14 16   Temp:  (!) 97.5 F (36.4 C) 98.4 F (36.9 C) 98.1 F  (36.7 C)  TempSrc:  Oral Oral Oral  SpO2: 95% 97% 92% 94%  Weight:      Height:        Physical Examination: General exam: alert awake, oriented x 1-2, follows commands, voice difficult to comprehend. HEENT:Oral mucosa DRYt, Ear/Nose WNL grossly. Respiratory system: Bilaterally clear BS,no use of accessory muscle. Cardiovascular system: S1 & S2 +, No JVD. Gastrointestinal system: Abdomen soft,NT,ND, BS+ Nervous System: Alert, awake, moving all extremities and non focal  and following commands. Extremities: extremities warm, leg edema neg dry skin. Skin: Warm, no rashes. MSK: Normal muscle bulk,tone, power.   Medications reviewed: Scheduled Meds:  amiodarone   200 mg Oral Daily   apixaban   2.5 mg Oral BID   atorvastatin   40 mg Oral QHS   calcium  carbonate  1,250 mg Oral Q breakfast   levothyroxine   50 mcg Oral Q0600   metoprolol  succinate  12.5 mg Oral Daily   oseltamivir   30 mg Oral Daily   pantoprazole   40 mg Oral Daily   tacrolimus   1 Application Topical BID   Continuous Infusions:  cefTRIAXone  (ROCEPHIN )  IV 1 g (12/31/23 1212)   lactated ringers  100 mL/hr at 12/31/23 1029   Diet: Diet Order             Diet heart healthy/carb modified Room service appropriate? Yes; Fluid consistency: Thin  Diet effective now                   Data Reviewed: I have personally reviewed following labs and imaging studies ( see epic result tab) CBC: Recent Labs  Lab 12/30/23 1536 12/31/23 1038  WBC 9.2 10.1  HGB 14.9 13.7  HCT 45.6 41.2  MCV 95.6 93.6  PLT 177 168   CMP: Recent Labs  Lab 12/30/23 1536 12/31/23 1038  NA 138 134*  K 4.6 4.0  CL 101 100  CO2 25 20*  GLUCOSE 104* 86  BUN 20 24*  CREATININE 0.84 0.80  CALCIUM  9.1 9.0   GFR: Estimated Creatinine Clearance: 29.3 mL/min (by C-G formula based on SCr of 0.8 mg/dL). Recent Labs  Lab 12/31/23 1038  AST 232*  ALT 209*  ALKPHOS 103  BILITOT 1.4*  PROT 5.6*  ALBUMIN 3.2*   No results for input(s):  LIPASE, AMYLASE in the last 168 hours. No results for input(s): AMMONIA in the last 168 hours. Coagulation Profile: No results for input(s): INR, PROTIME in the last 168 hours. Unresulted Labs (From admission, onward)     Start     Ordered   12/31/23 1049  Urine Culture (for pregnant, neutropenic or urologic patients or patients with an indwelling urinary catheter)  (Urine Labs)  Add-on,   AD       Question:  Indication  Answer:  Dysuria   12/31/23 1048           Antimicrobials/Microbiology: Anti-infectives (From admission, onward)    Start     Dose/Rate Route Frequency Ordered Stop   12/31/23 1200  cefTRIAXone  (ROCEPHIN ) 1 g in sodium chloride  0.9 % 100 mL IVPB  1 g 200 mL/hr over 30 Minutes Intravenous Every 24 hours 12/31/23 1048 01/05/24 1159   12/31/23 1100  oseltamivir  (TAMIFLU ) capsule 30 mg        30 mg Oral Daily 12/31/23 0948 01/05/24 0959         Component Value Date/Time   SDES  11/20/2021 2024    URINE, CLEAN CATCH Performed at [facility], [address], [place], [place] [address]    SPECREQUEST  11/20/2021 2024    NONE Performed at [facility], [address] [place], [place] [address]    CULT (A) 11/20/2021 2024    20,000 COLONIES/mL ESCHERICHIA COLI CRITICAL RESULT CALLED TO, READ BACK BY AND VERIFIED WITH: PHARMD G GADHIA [id number] AT 728 AM BY CM Performed at [facility], [address], [place], [place] [address]    REPTSTATUS 11/23/2021 FINAL 11/20/2021 2024    Procedures:    Mennie LAMY, MD [facility] 12/31/2023, 12:34 PM   "

## 2023-12-31 NOTE — Consult Note (Signed)
 "                                                  Palliative Care Consult Note                                  Date: 12/31/2023   Patient Name: Lisa Lang  DOB: [birth year]  MRN: [id number]  Age / Sex: 89 y.o., female  PCP: Alys Schuyler HERO, PA Referring Physician: Christobal Guadalajara, MD  Reason for Consultation: Establishing goals of care  HPI/Patient Profile: 89 y.o. female  with past medical history of HLD, paroxysmal atrial fibrillation on anticoagulation, osteoarthritis, and mitral valve prolapse admitted on 12/30/2023 after an unwitnessed fall.   Patient flu A positive. All x-ray showed no evidence of fractures or traumatic injury. Patient however has rising troponins. She has been admitted with traumatic fall and possible early rhabdomyolysis. Patient is confused which is not her baseline. Started on empiric rocephin  for suspected UTI.   Past Medical History:  Diagnosis Date   Arthritis    Diverticulitis 1995   And 2007   Dysrhythmia    Hyperlipidemia LDL goal <100    Mitral valve prolapse    Bileaflet MVP with moderate MR.   PAF (paroxysmal atrial fibrillation) (HCC) 11/2017   CHA2DS2 VASc score > 3 (Age >85 and female sex), --> Eliquis  & Metoprolol      Subjective:   I have reviewed medical records including EPIC notes, labs and imaging, received update from attending, assessed the patient and then spoke with her son to discuss GOC, EOL wishes, and disposition options.  I introduced Palliative Medicine as specialized medical care for people living with serious illness. It focuses on providing relief from symptoms and stress of a serious illness. The goal is to improve quality of life for both the patient and the family.  Today's Discussion: Patient lying in bed with neighbor Lisa Lang at bedside. Patient is oriented to self and answers some questions appropriately but then tells me of a church service she went to yesterday where people could fly. Lisa Lang found her after her fall. He  shared she has been confused since that time.  Spoke to patient's son Lisa by phone (he lives in [place] ). We discussed the patient's good health and functional status. She  is a very independent and functional 89 year old. She lives independently. She completes ADLs and IADLs including driving. Her appetite has been good. Patient has a helper who comes 1-2 times per week to assist with things as needed. She enjoys [employer] athletics, puzzles, basic cooking, and reading.   The patient has completed advanced directives. Her HCPOA document names her son Lisa Lang and proxy decision maker. Lisa tells me her living will indicates she would want a natural death in several circumstances and would not want artificial hydration or nutrition if artificial life support is withheld or withdrawn. Will have HCPOA and living will scanned into [employer]. The patient and her son had not discussed code status. He would like to keep her full code which reflects her previous choice. Encouraged him to discuss this with the patient when/if her mental status improves. I shared my concern that she is at risk for rehospitalizations.   At this time the family would like time for  outcomes. We discussed the patient will likely not be able to live independently at discharge. The patient and her son have discussed assisted living before but her son was waiting for her to make this decision. We discussed the possibility of SNF rehab at discharge.  Discussed the importance of continued conversation with family and the medical providers regarding overall plan of care and treatment options, ensuring decisions are within the context of the patient's values and GOCs.  Emotional support and therapeutic listening provided. Questions and concerns were addressed. Hard Choices booklet emailed for review. The family was encouraged to call with questions or concerns. PMT will continue to support holistically.  Review of Systems  Neurological:   Positive for weakness.    Objective:   Primary Diagnoses: Present on Admission:  Paroxysmal atrial fibrillation (HCC)  HLD (hyperlipidemia)  Fall, initial encounter  Troponin I above reference range  Influenza A   Physical Exam Vitals reviewed.  Constitutional:      General: She is not in acute distress. HENT:     Head: Normocephalic and atraumatic.     Mouth/Throat:     Mouth: Mucous membranes are dry.  Cardiovascular:     Rate and Rhythm: Normal rate.  Pulmonary:     Effort: Pulmonary effort is normal.  Neurological:     Mental Status: She is confused.     Vital Signs:  BP (!) 120/53 (BP Location: Right Arm)   Pulse 80   Temp 98.1 F (36.7 C) (Oral)   Resp 16   Ht 5' 2 (1.575 m)   Wt 43.1 kg   SpO2 94%   BMI 17.38 kg/m    Advanced Care Planning:   Existing Vynca/ACP Documentation: None  Primary Decision Maker: NEXT OF KIN/HCPOA son Lisa Lang  Code Status/Advance Care Planning: Full code   Assessment & Plan:   SUMMARY OF RECOMMENDATIONS   Continue full code and full scope of care Time for outcomes/improvement HCPOA and living will to be placed in [employer] Continued PMT support  Discussed with: Dr. Christobal  Time Total: 90 minutes    Thank you for allowing us  to participate in the care of Lisa Lang PMT will continue to support holistically.  Signed by: Stephane Palin, NP Palliative Medicine Team  Team Phone # [phone] (Nights/Weekends)  12/31/2023, 11:55 AM   "

## 2023-12-31 NOTE — Evaluation (Signed)
 Physical Therapy Evaluation Patient Details Name: Lisa Lang MRN: [id number] DOB: 1927/08/24 Today's Date: 12/31/2023  History of Present Illness  Lisa Lang is a 89 y.o. female who sustained an unwitnessed fall and was found by neighbor several hours later. Pt + flu A. PMH: hyperlipidemia, paroxysmal atrial fibrillation on anticoagulation, mitral valve prolapse, osteoarthritis   Clinical Impression  Pt admitted with above. PTA pt was living alone with PRN assist, didn't use AD, and had assist with transportation. Pt now presenting with confusion, impaired memory, impaired balance, generalized weakness, increased fall risk, and requires mod to maxA for mobility and ADLs. Pt unsafe to return home alone at this time and would benefit from inpatient rehab program < 3 hrs a day to allow for increased time to recover medically and achieve safe mod I level of function for safe transition home.         If plan is discharge home, recommend the following: A lot of help with walking and/or transfers;A lot of help with bathing/dressing/bathroom;Assist for transportation;Help with stairs or ramp for entrance;Supervision due to cognitive status;Assistance with cooking/housework   Can travel by private vehicle   No    Equipment Recommendations  (TBD at next venue)  Recommendations for Other Services       Functional Status Assessment Patient has had a recent decline in their functional status and/or demonstrates limited ability to make significant improvements in function in a reasonable and predictable amount of time     Precautions / Restrictions Precautions Precautions: Fall Precaution/Restrictions Comments: confusion Restrictions Weight Bearing Restrictions Per Provider Order: No      Mobility  Bed Mobility Overal bed mobility: Needs Assistance Bed Mobility: Supine to Sit, Sit to Supine     Supine to sit: Mod assist, HOB elevated, Used rails Sit to supine: Mod assist, Used  rails   General bed mobility comments: pt initiated movement however also resistant to trunk elevation, modA for trunk elevation and to scoot to EOB    Transfers Overall transfer level: Needs assistance Equipment used: Rolling walker (2 wheels) Transfers: Sit to/from Stand Sit to Stand: Max assist           General transfer comment: maxA to power up, pt with report of I don't trust myself right now pt with  R lateral bias/lean in conjunction with retropulsion, maxA to maintain upright posture, pt did side step to HOB x 3 steps with maxA for weight shifting to initiate side stepping    Ambulation/Gait               General Gait Details: unable this date  Stairs            Wheelchair Mobility     Tilt Bed    Modified Rankin (Stroke Patients Only)       Balance Overall balance assessment: Mild deficits observed, not formally tested, Needs assistance Sitting-balance support: Feet supported, Bilateral upper extremity supported Sitting balance-Leahy Scale: Fair Sitting balance - Comments: posterior bias   Standing balance support: Bilateral upper extremity supported, During functional activity, Reliant on assistive device for balance Standing balance-Leahy Scale: Zero Standing balance comment: pt with R lateral bias and retropulsion                             Pertinent Vitals/Pain Pain Assessment Pain Assessment: No/denies pain    Home Living Family/patient expects to be discharged to:: Private residence Living Arrangements: Alone Available Help at  Discharge: Family;Available PRN/intermittently Type of Home: House Home Access: Stairs to enter Entrance Stairs-Rails: Can reach both Entrance Stairs-Number of Steps: 1   Home Layout: One level Home Equipment: Wheelchair - manual;Tub bench;Grab bars - tub/shower;Rolling Walker (2 wheels);[place] - single point      Prior Function Prior Level of Function : Independent/Modified  Independent;Driving;Patient poor historian/Family not available (unsure of accuracy of PLOF report by patient, info taken from previous admission in May 2025)             Mobility Comments: She did not use an AD inside the home, however used a cane outside the home. ADLs Comments: She was independent with ADLs, cooking, cleaning, and driving. Pt does state Channing comes and helps her anytime she needs but also states she comes 1x/wk     Extremity/Trunk Assessment   Upper Extremity Assessment Upper Extremity Assessment: Generalized weakness    Lower Extremity Assessment Lower Extremity Assessment: Generalized weakness    Cervical / Trunk Assessment Cervical / Trunk Assessment: Kyphotic  Communication   Communication Communication: Impaired Factors Affecting Communication: Difficulty expressing self    Cognition Arousal: Alert Behavior During Therapy: WFL for tasks assessed/performed   PT - Cognitive impairments: Orientation, Awareness, Memory, Attention, Sequencing, Problem solving   Orientation impairments: Place (stated she was at home and then at church after pt was re-oriented to hospital by PT)                   PT - Cognition Comments: pt pleasantly confused with poor recall of re-orientation info, noted fear of falling limiting pt's ability to follow commands for safe mobility due to increased anxiety re: falling. Pt  tangential of irrelevant conversation ie. living in [place]  Following commands: Impaired Following commands impaired: Follows one step commands with increased time     Cueing Cueing Techniques: Verbal cues, Gestural cues, Tactile cues     General Comments General comments (skin integrity, edema, etc.): pt with bruising t/o LEs and UEs from fall    Exercises     Assessment/Plan    PT Assessment Patient needs continued PT services  PT Problem List Decreased strength;Decreased range of motion;Decreased activity tolerance;Decreased  mobility;Decreased balance;Decreased coordination;Decreased cognition;Decreased safety awareness;Decreased knowledge of use of DME       PT Treatment Interventions DME instruction;Gait training;Stair training;Functional mobility training;Therapeutic activities;Therapeutic exercise;Balance training    PT Goals (Current goals can be found in the Care Plan section)  Acute Rehab PT Goals Patient Stated Goal: didn't state PT Goal Formulation: Patient unable to participate in goal setting Time For Goal Achievement: 01/14/24 Potential to Achieve Goals: Fair    Frequency Min 2X/week     Co-evaluation               AM-PAC PT 6 Clicks Mobility  Outcome Measure Help needed turning from your back to your side while in a flat bed without using bedrails?: A Lot Help needed moving from lying on your back to sitting on the side of a flat bed without using bedrails?: A Lot Help needed moving to and from a bed to a chair (including a wheelchair)?: A Lot Help needed standing up from a chair using your arms (e.g., wheelchair or bedside chair)?: A Lot Help needed to walk in hospital room?: Total Help needed climbing 3-5 steps with a railing? : Total 6 Click Score: 10    End of Session Equipment Utilized During Treatment: Gait belt Activity Tolerance: Patient tolerated treatment well Patient left: in bed;with call  bell/phone within reach;with bed alarm set (ECHO technician present) Nurse Communication: Mobility status PT Visit Diagnosis: Unsteadiness on feet (R26.81);Muscle weakness (generalized) (M62.81);History of falling (Z91.81);Difficulty in walking, not elsewhere classified (R26.2)    Time: 9192-9169 PT Time Calculation (min) (ACUTE ONLY): 23 min   Charges:   PT Evaluation $PT Eval Low Complexity: 1 Low   PT General Charges $$ ACUTE PT VISIT: 1 Visit         Norene Ames, PT, DPT Acute Rehabilitation Services Secure chat preferred Office #: [phone]   Norene CHRISTELLA Ames 12/31/2023, 10:31 AM

## 2023-12-31 NOTE — Plan of Care (Signed)
   Problem: Education: Goal: Knowledge of General Education information will improve Description Including pain rating scale, medication(s)/side effects and non-pharmacologic comfort measures Outcome: Progressing   Problem: Clinical Measurements: Goal: Will remain free from infection Outcome: Progressing   Problem: Clinical Measurements: Goal: Cardiovascular complication will be avoided Outcome: Progressing

## 2023-12-31 NOTE — Hospital Course (Addendum)
 Lisa Lang is a 89 y.o. female with PMH of persistent AF on Eliquis  recent DCCV on 11/25/2023, aortic atherosclerosis MVP with moderate to severe MR, arthritis, hyperlipidemia, hypothyroidism presented after an unwitnessed fall around 5 AM on 12/26 and found by neighbor inside her house in the afternoon, EMS was brought with hypoxic 87% on room air and placed on 2 L [place] and improved to 98% Patient reported she has fallen asleep on the couch overnight and got up too fast to work, her legs buckled and gave out and she fell at home and could not get off the floor.,  Patient was complaining about pain in her tailbone. In the ED: Vitals stable, blood work showed slight elevated CK, elevated troponin rest of the labs reassuring.  Patient without chest pain and EKG unchanged, tested positive for influenza A Imaging CT head, C-spine, lumbar spine, x-ray pelvis chest xray >No evidence of trauma no acute intracranial mobility or acute fracture. Patient is unable to stand on her own, patient is admitted for further management Workup showed influenza A UTI along with encephalopathy dehydration which improved with IV fluid hydration. PT OT did well recommending SNF mental status improved At this time she is waiting for placement-and remains medically stable  Subjective: Seen and examined  Patient remains alert awake interactive Remains afebrile vital stable Eager to go to SNF  Assessment and plan  Unwitnessed fall at home Generalized weakness likely from influenza leading to fall: Imaging with chest x-ray pelvis x-ray CT lumbar spine C-spine, CT head no acute acute traumatic fracture. Had rhabdomyolysis which got better with IV fluid hydration. Vitamin D  level normal, B12 elevated TSH stable  Mentation much improved overall improving strength, PT recommending SNF  Acute metabolic encephalopathy UTI: Mental status improved close to baseline.At baseline living independently and mobilizing well. UA  abnormal-culture with multiple species-Compling antibiotics today  Influenza A: Complte renally dosed  Tamiflu  x 5 days on 12/31, cont droplet precaution x 7 days.  Has been afebrile  Diarrhea: Stable.  No sample for testing.  Dehydration Hyponatremia mild Metabolic acidosis: Electrolytes stabilized and now  taking p.o well. Off ivf  Elevated troponin: Demand ischemia type II likely from rhabdomyolysis and influenza-seen by cardiology no further recommendation Echo completed EF 65 to 70% no RWMA  Persistent A-fib Multiple valvular heart disease/MVP with MR,  AR TR: Recent DCCV 11/25/2023, on renally dosed Eliquis . Toprol  discontinued 2/2 below per cardiology, continue amiodarone   Bifascicular block with first-degree AV block: Metropol has been discontinued.  Mild rhabdomyolysis: Due to fall, CK nicely improving gentle IV fluid hydration .  Monitor while on statin. Recent Labs  Lab 12/30/23 1536 12/31/23 1038 01/01/24 0752  CKTOTAL 653* 679* 427*    HLD: on statins-resume on dc.  Moderate malnutrition with Body mass index is 17.38 kg/m.: Cont supplements. Nutrition Problem: Increased nutrient needs Etiology: acute illness Signs/Symptoms: estimated needs Interventions: Ensure Enlive (each supplement provides 350kcal and 20 grams of protein), Magic cup, MVI   Mild transaminitis- Suspect from rhabdo. S/p ERCP and laparoscopic cholecystectomy in 2023  GOC: Consult palliative care per son she has a living will and he will fax it and she wishes for natural death She has been full code in the past. I had updated son, he is in [place]. Niece visiting Palliative care is following  Mobility: PT Orders: Active PT Follow up Rec: Skilled Nursing-Short Term Rehab (<3 Hours/Day)01/03/2024 1241   DVT prophylaxis: apixaban  (ELIQUIS ) tablet 2.5 mg Start: 12/31/23 0100 Code Status:  Code Status: Full Code Family Communication: plan of care discussed with patient at bedside.   Previously called her son.  Updated family and neighbor at bedside multiple occasions Patient status is: Remains hospitalized because of severity of illness Level of care: Telemetry   Dispo: The patient is from: home aone            Anticipated disposition: SNF  Objective: Vitals last 24 hrs: Vitals:   01/03/24 0721 01/03/24 1946 01/04/24 0246 01/04/24 0721  BP: (!) 110/50 (!) 124/52 (!) 146/62 (!) 123/51  Pulse: 67 75 89 77  Resp: 16   16  Temp: 98.6 F (37 C) 98.5 F (36.9 C) 97.6 F (36.4 C) 99 F (37.2 C)  TempSrc:  Axillary Oral   SpO2: 94% 93% 93% 90%  Weight:      Height:       Physical Examination: General exam: AAO x3 HEENT:Oral mucosa wet,Ear/Nose WNL grossly. Respiratory system: Bilaterally clear Cardiovascular system: S1 & S2 +, No JVD. Gastrointestinal system: Abdomen soft,NTBS+ Nervous System: Alert, awake,non focal, weak overall Extremities: extremities warm, leg edema neg dry skin. Skin: Warm, no rashes. MSK: Normal muscle bulk,tone, power.

## 2024-01-01 DIAGNOSIS — Z7189 Other specified counseling: Secondary | ICD-10-CM | POA: Diagnosis not present

## 2024-01-01 DIAGNOSIS — Z515 Encounter for palliative care: Secondary | ICD-10-CM | POA: Diagnosis not present

## 2024-01-01 DIAGNOSIS — R531 Weakness: Secondary | ICD-10-CM | POA: Diagnosis not present

## 2024-01-01 DIAGNOSIS — G9341 Metabolic encephalopathy: Secondary | ICD-10-CM | POA: Insufficient documentation

## 2024-01-01 DIAGNOSIS — I214 Non-ST elevation (NSTEMI) myocardial infarction: Secondary | ICD-10-CM | POA: Diagnosis not present

## 2024-01-01 LAB — COMPREHENSIVE METABOLIC PANEL WITH GFR
ALT: 177 U/L — ABNORMAL HIGH (ref 0–44)
AST: 185 U/L — ABNORMAL HIGH (ref 15–41)
Albumin: 2.9 g/dL — ABNORMAL LOW (ref 3.5–5.0)
Alkaline Phosphatase: 91 U/L (ref 38–126)
Anion gap: 11 (ref 5–15)
BUN: 22 mg/dL (ref 8–23)
CO2: 24 mmol/L (ref 22–32)
Calcium: 8.4 mg/dL — ABNORMAL LOW (ref 8.9–10.3)
Chloride: 100 mmol/L (ref 98–111)
Creatinine, Ser: 0.71 mg/dL (ref 0.44–1.00)
GFR, Estimated: >60 mL/min
Glucose, Bld: 79 mg/dL (ref 70–99)
Potassium: 3.3 mmol/L — ABNORMAL LOW (ref 3.5–5.1)
Sodium: 136 mmol/L (ref 135–145)
Total Bilirubin: 1 mg/dL (ref 0.0–1.2)
Total Protein: 5 g/dL — ABNORMAL LOW (ref 6.5–8.1)

## 2024-01-01 LAB — CBC
HCT: 37.7 % (ref 36.0–46.0)
Hemoglobin: 12.6 g/dL (ref 12.0–15.0)
MCH: 31.3 pg (ref 26.0–34.0)
MCHC: 33.4 g/dL (ref 30.0–36.0)
MCV: 93.5 fL (ref 80.0–100.0)
Platelets: 158 K/uL (ref 150–400)
RBC: 4.03 MIL/uL (ref 3.87–5.11)
RDW: 14.2 % (ref 11.5–15.5)
WBC: 9 K/uL (ref 4.0–10.5)
nRBC: 0 % (ref 0.0–0.2)

## 2024-01-01 LAB — URINE CULTURE

## 2024-01-01 LAB — CK: Total CK: 427 U/L — ABNORMAL HIGH (ref 38–234)

## 2024-01-01 MED ORDER — ADULT MULTIVITAMIN W/MINERALS CH
1.0000 | ORAL_TABLET | Freq: Every day | ORAL | Status: DC
  Administered 2024-01-01 – 2024-01-04 (×4): 1 via ORAL
  Filled 2024-01-01: qty 1

## 2024-01-01 MED ORDER — ENSURE PLUS HIGH PROTEIN PO LIQD
237.0000 mL | Freq: Two times a day (BID) | ORAL | Status: DC
  Administered 2024-01-01 – 2024-01-04 (×5): 237 mL via ORAL

## 2024-01-01 MED ORDER — POTASSIUM CHLORIDE CRYS ER 20 MEQ PO TBCR
40.0000 meq | EXTENDED_RELEASE_TABLET | Freq: Once | ORAL | Status: AC
  Administered 2024-01-01: 40 meq via ORAL
  Filled 2024-01-01: qty 2

## 2024-01-01 MED ORDER — LACTATED RINGERS IV SOLN: INTRAVENOUS | Status: AC

## 2024-01-01 NOTE — Progress Notes (Signed)
 " PROGRESS NOTE Lisa Lang  FMW:[id number] DOB: 11-Sep-1927 DOA: 12/30/2023 PCP: Alys Schuyler HERO, PA  Brief Narrative/Hospital Course: Lisa Lang is a 89 y.o. female with PMH of persistent AF on Eliquis  recent DCCV on 11/25/2023, aortic atherosclerosis MVP with moderate to severe MR, arthritis, hyperlipidemia, hypothyroidism presented after an unwitnessed fall around 5 AM on 12/26 and found by neighbor inside her house in the afternoon, EMS was brought with hypoxic 87% on room air and placed on 2 L [place] and improved to 98% Patient reported she has fallen asleep on the couch overnight and got up too fast to work, her legs buckled and gave out and she fell at home and could not get off the floor.,  Patient was complaining about pain in her tailbone. In the ED: Vitals stable, blood work showed slight elevated CK, elevated troponin rest of the labs reassuring.  Patient without chest pain and EKG unchanged, tested positive for influenza A Imaging CT head, C-spine, lumbar spine, x-ray pelvis chest xray >No evidence of trauma no acute intracranial mobility or acute fracture. Patient is unable to stand on her own, patient is admitted for further management  Subjective: Seen and examined  More alert awake, still coughing still confused, know the location and able to recollect the events Overnight remains afebrile, on RA, VSS, Labs-mild hypokalemia transaminitis improving CK down to 427  troponin 44> 135> 207 Patient's niece and neighbor at the bedside  Assessment and plan:  Unwitnessed fall at home Generalized weakness likely from influenza leading to fall: Imaging with chest x-ray pelvis x-ray CT lumbar spine C-spine CT head no acute acute traumatic fracture. Had rhabdomyolysis which is improving, on Eliquis  PTA and continued. Vitamin D  level normal, B12 elevated TSH stable Continue PT OT  Acute metabolic encephalopathy UTI: Appears less  confused today. non focal on exam, follows commands .   At baseline living independently and mobilizing well.UA abnormal continue ceftriaxone  follow-up culture.  Continue treat underlying cause including UTI influenza deconditioning.  Influenza A: Having cough weakness-continue Tamiflu  renally dosed.  Continue droplet precaution  Diarrhea: Mild, check for C. Difficile.?  Viral.  Dehydration Hyponatremia mild Metabolic acidosis: Electrolytes slowly improving replace potassium bicarb improved   Elevated troponin: Demand ischemia type II likely from rhabdomyolysis and influenza, per cardiology no plan for ischemic evaluation continue anticoagulation for A-fib.  Echo completed EF 65 to 70% no RWMA  Persistent A-fib Multiple valvular heart disease/MVP with MR,  AR TR: Recent DCCV 11/25/2023, on renally dosed Eliquis . Toprol  discontinued per cardiology, continue amiodarone   Bifascicular block with first-degree AV block: Metropol has been discontinued.  Mild rhabdomyolysis: Due to fall, CK nicely improving gentle IV fluid hydration .  Monitor while on statin. Recent Labs  Lab 12/30/23 1536 12/31/23 1038 01/01/24 0752  CKTOTAL 653* 679* 427*    HLD: on statins.  Severe malnutrition with Body mass index is 17.38 kg/m.: Add supplements.  Consulted RD  Mild transaminitis- Suspect from rhabdo. S/p ERCP and laparoscopic cholecystectomy in 2023  GOC: Consult palliative care per son she has a living will and he will fax it and she wishes for natural death She has been full code in the past. I had updated son, he is in [place]. Niece visiting Palliative care is following  Mobility: PT Orders: Active PT Follow up Rec: Skilled Nursing-Short Term Rehab (<3 Hours/Day)12/31/2023 1000   DVT prophylaxis: apixaban  (ELIQUIS ) tablet 2.5 mg Start: 12/31/23 0100 Code Status:   Code Status: Full Code Family Communication: plan of  care discussed with patient at bedside. Called her son to update. And met niece at bedside. Patient status is: Remains  hospitalized because of severity of illness Level of care: Telemetry   Dispo: The patient is from: home aone            Anticipated disposition: SNF 1-2 days.  Objective: Vitals last 24 hrs: Vitals:   12/31/23 1505 12/31/23 1947 01/01/24 0438 01/01/24 0802  BP: (!) 123/53 92/67 (!) 139/52 (!) 111/46  Pulse: 66 63 70 64  Resp: 16 18 18 17   Temp: 98.7 F (37.1 C) 98.8 F (37.1 C) 98 F (36.7 C) 98.3 F (36.8 C)  TempSrc: Oral Oral Oral Oral  SpO2: 94% 94% 92% 93%  Weight:      Height:        Physical Examination: General exam: AAOx2 HEENT:Oral mucosa wet,Ear/Nose WNL grossly. Respiratory system: Bilaterally clear BS  Cardiovascular system: S1 & S2 +, No JVD. Gastrointestinal system: Abdomen soft,NT,ND, BS+ Nervous System: Alert, awake, moving all extremities and non focal  and following commands. Extremities: extremities warm, leg edema neg dry skin. Skin: Warm, no rashes. MSK: Normal muscle bulk,tone, power.   Medications reviewed: Scheduled Meds:  amiodarone   200 mg Oral Daily   apixaban   2.5 mg Oral BID   calcium  carbonate  1,250 mg Oral Q breakfast   levothyroxine   50 mcg Oral Q0600   oseltamivir   30 mg Oral Daily   pantoprazole   40 mg Oral Daily   potassium chloride   40 mEq Oral Once   tacrolimus   1 Application Topical BID   Continuous Infusions:  cefTRIAXone  (ROCEPHIN )  IV 1 g (12/31/23 1212)   lactated ringers      Diet: Diet Order             Diet heart healthy/carb modified Room service appropriate? Yes; Fluid consistency: Thin  Diet effective now                   Data Reviewed: I have personally reviewed following labs and imaging studies ( see epic result tab) CBC: Recent Labs  Lab 12/30/23 1536 12/31/23 1038 01/01/24 0752  WBC 9.2 10.1 9.0  HGB 14.9 13.7 12.6  HCT 45.6 41.2 37.7  MCV 95.6 93.6 93.5  PLT 177 168 158   CMP: Recent Labs  Lab 12/30/23 1536 12/31/23 1038 01/01/24 0752  NA 138 134* 136  K 4.6 4.0 3.3*  CL 101 100  100  CO2 25 20* 24  GLUCOSE 104* 86 79  BUN 20 24* 22  CREATININE 0.84 0.80 0.71  CALCIUM  9.1 9.0 8.4*   GFR: Estimated Creatinine Clearance: 29.3 mL/min (by C-G formula based on SCr of 0.71 mg/dL). Recent Labs  Lab 12/31/23 1038 01/01/24 0752  AST 232* 185*  ALT 209* 177*  ALKPHOS 103 91  BILITOT 1.4* 1.0  PROT 5.6* 5.0*  ALBUMIN 3.2* 2.9*   No results for input(s): LIPASE, AMYLASE in the last 168 hours. No results for input(s): AMMONIA in the last 168 hours. Coagulation Profile: No results for input(s): INR, PROTIME in the last 168 hours. Unresulted Labs (From admission, onward)     Start     Ordered   01/01/24 0948  C Difficile Quick Screen w PCR reflex  (C Difficile quick screen w PCR reflex panel )  Once, for 24 hours,   TIMED       References:    CDiff Information Tool   01/01/24 0948   01/01/24 0500  CBC  Daily,   R     Question:  Specimen collection method  Answer:  IV Team=IV Team collect   12/31/23 1235   01/01/24 0500  Comprehensive metabolic panel  Daily,   R     Question:  Specimen collection method  Answer:  IV Team=IV Team collect   12/31/23 1235           Antimicrobials/Microbiology: Anti-infectives (From admission, onward)    Start     Dose/Rate Route Frequency Ordered Stop   12/31/23 1200  cefTRIAXone  (ROCEPHIN ) 1 g in sodium chloride  0.9 % 100 mL IVPB        1 g 200 mL/hr over 30 Minutes Intravenous Every 24 hours 12/31/23 1048 01/05/24 1159   12/31/23 1100  oseltamivir  (TAMIFLU ) capsule 30 mg        30 mg Oral Daily 12/31/23 0948 01/05/24 0959         Component Value Date/Time   SDES URINE, CLEAN CATCH 12/30/2023 1640   SPECREQUEST  12/30/2023 1640    NONE Performed at [facility], [address] [place], [place] [address]    CULT MULTIPLE SPECIES PRESENT, SUGGEST RECOLLECTION (A) 12/30/2023 1640   REPTSTATUS 01/01/2024 FINAL 12/30/2023 1640    Procedures:    Mennie LAMY, MD [facility] 01/01/2024, 11:51 AM    "

## 2024-01-01 NOTE — NC FL2 (Signed)
 " [place]  MEDICAID FL2 LEVEL OF CARE FORM     IDENTIFICATION  Patient Name: Lisa Lang Birthdate: Apr 01, 1927 Sex: female Admission Date (Current Location): 12/30/2023  [facility] and [place] Number:  Producer, Television/film/video and Address:  [facility] [place], [address] [place], [place] [address]      Provider Number: [id number]  Attending Physician Name and Address:  Christobal Guadalajara, MD  Relative Name and Phone Number:  Gilmer vaughn devonshire [phone]    Current Level of Care: SNF Recommended Level of Care: Skilled Nursing Facility Prior Approval Number:    Date Approved/Denied:   PASRR Number: [id number]  Discharge Plan: SNF    Current Diagnoses: Patient Active Problem List   Diagnosis Date Noted   Acute metabolic encephalopathy 01/01/2024   Troponin I above reference range 12/30/2023   Influenza A 12/30/2023   Weakness generalized 12/30/2023   Subarachnoid hematoma (HCC) 06/02/2023   Fall, initial encounter 01/04/2023   On amiodarone  therapy 01/08/2022   Acute cholecystitis 11/21/2021   Choledocholithiasis 11/21/2021   Sepsis (HCC) 11/21/2021   Dehydration 11/21/2021   Acute cystitis 11/21/2021   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) 11/02/2021   A-fib (HCC) 10/24/2021   Hypercoagulable state due to paroxysmal atrial fibrillation (HCC): CHA2DS2-VASc score 6 10/24/2021   Moderate to severe mitral regurgitation 01/14/2020   Fatigue due to treatment 11/14/2017   HLD (hyperlipidemia) 11/09/2017   Arrhythmogenic bileaflet mitral valve prolapse syndrome 11/09/2017   Paroxysmal atrial fibrillation (HCC) 09/27/2017   Ovarian cyst 09/26/2017    Orientation RESPIRATION BLADDER Height & Weight     Self, Place  O2 Incontinent Weight: 95 lb (43.1 kg) Height:  5' 2 (157.5 cm)  BEHAVIORAL SYMPTOMS/MOOD NEUROLOGICAL BOWEL NUTRITION STATUS      Continent Diet  AMBULATORY STATUS COMMUNICATION OF NEEDS Skin   Limited Assist Verbally                          Personal Care Assistance Level of Assistance  Dressing, Bathing Bathing Assistance: Limited assistance   Dressing Assistance: Limited assistance     Functional Limitations Info  Sight, Hearing Sight Info: Adequate Hearing Info: Adequate      SPECIAL CARE FACTORS FREQUENCY  PT (By licensed PT), OT (By licensed OT)     PT Frequency: 5x wk OT Frequency: 3x wk            Contractures Contractures Info: Not present    Additional Factors Info  Code Status Code Status Info: full             Current Medications (01/01/2024):  This is the current hospital active medication list Current Facility-Administered Medications  Medication Dose Route Frequency Provider Last Rate Last Admin   acetaminophen  (TYLENOL ) tablet 650 mg  650 mg Oral Q6H PRN Sim Emery CROME, MD       Or   acetaminophen  (TYLENOL ) suppository 650 mg  650 mg Rectal Q6H PRN Sim Emery CROME, MD       amiodarone  (PACERONE ) tablet 200 mg  200 mg Oral Daily Garba, Mohammad L, MD   200 mg at 01/01/24 1149   apixaban  (ELIQUIS ) tablet 2.5 mg  2.5 mg Oral BID Garba, Mohammad L, MD   2.5 mg at 01/01/24 1149   artificial tears ophthalmic solution 1 drop  1 drop Both Eyes Daily PRN Christobal Guadalajara, MD       calcium  carbonate (OS-CAL - dosed in mg of elemental calcium )  tablet 1,250 mg  1,250 mg Oral Q breakfast Kc, Ramesh, MD   1,250 mg at 01/01/24 1149   cefTRIAXone  (ROCEPHIN ) 1 g in sodium chloride  0.9 % 100 mL IVPB  1 g Intravenous Q24H Kc, Ramesh, MD 200 mL/hr at 01/01/24 1152 1 g at 01/01/24 1152   feeding supplement (ENSURE PLUS HIGH PROTEIN) liquid 237 mL  237 mL Oral BID BM Kc, Mennie, MD       lactated ringers  infusion   Intravenous Continuous Christobal Mennie, MD 75 mL/hr at 01/01/24 1151 New Bag at 01/01/24 1151   levothyroxine  (SYNTHROID ) tablet 50 mcg  50 mcg Oral Q0600 Sim Emery CROME, MD   50 mcg at 01/01/24 9282   morphine  (PF) 2 MG/ML injection 2 mg  2 mg Intravenous Q4H PRN Garba, Mohammad L, MD   2  mg at 12/31/23 0356   multivitamin with minerals tablet 1 tablet  1 tablet Oral Daily Kc, Ramesh, MD       ondansetron  (ZOFRAN ) tablet 4 mg  4 mg Oral Q6H PRN Sim Emery CROME, MD       Or   ondansetron  (ZOFRAN ) injection 4 mg  4 mg Intravenous Q6H PRN Sim Emery CROME, MD       oseltamivir  (TAMIFLU ) capsule 30 mg  30 mg Oral Daily Kc, Ramesh, MD   30 mg at 01/01/24 1149   pantoprazole  (PROTONIX ) EC tablet 40 mg  40 mg Oral Daily Garba, Mohammad L, MD   40 mg at 01/01/24 1149   tacrolimus  (PROTOPIC ) 0.1 % ointment 1 Application  1 Application Topical BID Christobal Mennie, MD         Discharge Medications: Please see discharge summary for a list of discharge medications.  Relevant Imaging Results:  Relevant Lab Results:   Additional Information SS: [id number]  Darin JULIANNA Bend, LCSW     "

## 2024-01-01 NOTE — TOC CAGE-AID Note (Signed)
 Transition of Care [facility]) - CAGE-AID Screening   Patient Details  Name: Lisa Lang MRN: [id number] Date of Birth: [birth year]  Transition of Care [facility]) CM/SW Contact:    LEBRON ROCKIE ORN, RN Phone Number: [phone] 01/01/2024, 6:50 PM   Clinical Narrative: Pt denies illicit drug or alcohol  use.  Screening complete.    CAGE-AID Screening:    Have You Ever Felt You Ought to Cut Down on Your Drinking or Drug Use?: No Have People Annoyed You By Critizing Your Drinking Or Drug Use?: No Have You Felt Bad Or Guilty About Your Drinking Or Drug Use?: No Have You Ever Had a Drink or Used Drugs First Thing In The Morning to Steady Your Nerves or to Get Rid of a Hangover?: No CAGE-AID Score: 0  Substance Abuse Education Offered: No

## 2024-01-01 NOTE — Evaluation (Signed)
 Occupational Therapy Evaluation Patient Details Name: Lisa Lang MRN: [id number] DOB: 05-12-1927 Today's Date: 01/01/2024   History of Present Illness   Lisa Lang is a 89 y.o. female who sustained an unwitnessed fall and was found by neighbor several hours later. Pt + flu A. PMH: hyperlipidemia, paroxysmal atrial fibrillation on anticoagulation, mitral valve prolapse, osteoarthritis     Clinical Impressions Pt is from home alone where she is very independent per chart review has assist 2x/wk for household chores or whatever needs to be done. Still drives, enjoys [place] athletics, puzzles, basic cooking, and reading. Today she is oriented and while cognition seems to have improved from yesterday deficits remain and she is fearful during mobility. She is overall set up to mod A for UB ADL and mod to max A for LB ADL. She is max to mod A for transfers with face to face and gait belt. OT will follow acutely to maximize safety and independence in ADL and functional transfers, post-acute recommending rehab of <3 hours daily. Next session hopeful to continue OOB activity, and work on balance for ADL in sitting and standing.      If plan is discharge home, recommend the following:   Two people to help with walking and/or transfers;A lot of help with bathing/dressing/bathroom;Direct supervision/assist for medications management;Direct supervision/assist for financial management;Assist for transportation;Help with stairs or ramp for entrance     Functional Status Assessment   Patient has had a recent decline in their functional status and demonstrates the ability to make significant improvements in function in a reasonable and predictable amount of time.     Equipment Recommendations   None recommended by OT (Pt has appropriate DME)     Recommendations for Other Services   PT consult     Precautions/Restrictions   Precautions Precautions: Fall Precaution/Restrictions  Comments: confusion Restrictions Weight Bearing Restrictions Per Provider Order: No     Mobility Bed Mobility Overal bed mobility: Needs Assistance Bed Mobility: Supine to Sit, Sit to Supine     Supine to sit: Mod assist, HOB elevated, Used rails Sit to supine: Mod assist, Used rails   General bed mobility comments: pt initiated movement however also resistant to trunk elevation, modA for trunk elevation and to scoot to EOB    Transfers Overall transfer level: Needs assistance Equipment used: 1 person hand held assist Transfers: Sit to/from Stand, Bed to chair/wheelchair/BSC Sit to Stand: Max assist, Mod assist     Step pivot transfers: Max assist     General transfer comment: max A to reach standing with assist weight shifting by dancing back and forth with verbal and tactile cues      Balance Overall balance assessment: Mild deficits observed, not formally tested, Needs assistance Sitting-balance support: Feet supported, Bilateral upper extremity supported Sitting balance-Leahy Scale: Fair Sitting balance - Comments: posterior bias   Standing balance support: Bilateral upper extremity supported, During functional activity, Reliant on assistive device for balance Standing balance-Leahy Scale: Poor Standing balance comment: dependent on external support from therapist                           ADL either performed or assessed with clinical judgement   ADL Overall ADL's : Needs assistance/impaired Eating/Feeding: Set up;Bed level   Grooming: Oral care;Wash/dry face;Set up;Sitting Grooming Details (indicate cue type and reason): EOB Upper Body Bathing: Moderate assistance   Lower Body Bathing: Moderate assistance   Upper Body Dressing : Minimal  assistance   Lower Body Dressing: Maximal assistance;Sit to/from stand   Toilet Transfer: Moderate assistance;Stand-pivot;BSC/3in1 Statistician Details (indicate cue type and reason): face to face  assist Toileting- Clothing Manipulation and Hygiene: Moderate assistance;Sitting/lateral lean       Functional mobility during ADLs: Maximal assistance (face to face SPT transfer) General ADL Comments: cognition improving from yesterday per notes, but not at baseline for problem solving and performance of ADL     Vision Baseline Vision/History: 1 Wears glasses Patient Visual Report: No change from baseline       Perception         Praxis         Pertinent Vitals/Pain Pain Assessment Pain Assessment: No/denies pain Breathing: normal Negative Vocalization: none     Extremity/Trunk Assessment Upper Extremity Assessment Upper Extremity Assessment: Generalized weakness   Lower Extremity Assessment Lower Extremity Assessment: Defer to PT evaluation   Cervical / Trunk Assessment Cervical / Trunk Assessment: Kyphotic   Communication Communication Communication: Impaired Factors Affecting Communication: Difficulty expressing self   Cognition Arousal: Alert Behavior During Therapy: WFL for tasks assessed/performed Cognition: Cognition impaired     Awareness: Online awareness impaired Memory impairment (select all impairments): Working memory Attention impairment (select first level of impairment): Selective attention Executive functioning impairment (select all impairments): Reasoning, Problem solving, Initiation OT - Cognition Comments: Pt fearful of falling, pleasant, cooperative, needs mod cues for initiation and lots of encouragement.                 Following commands: Impaired Following commands impaired: Follows one step commands with increased time     Cueing  General Comments   Cueing Techniques: Verbal cues;Gestural cues;Tactile cues      Exercises     Shoulder Instructions      Home Living Family/patient expects to be discharged to:: Private residence Living Arrangements: Alone Available Help at Discharge: Family;Available  PRN/intermittently;Personal care attendant;Neighbor (PCA comes 2 times a week) Type of Home: House Home Access: Stairs to enter [employer] of Steps: 1 Entrance Stairs-Rails: Can reach both Home Layout: One level     Bathroom Shower/Tub: Chief Strategy Officer: Handicapped height     Home Equipment: Wheelchair - manual;Tub bench;Grab bars - tub/shower;Rolling Environmental Consultant (2 wheels);Cane - single point   Additional Comments: has a helper who comes 1-2 times per week to assist with things as needed. She enjoys [place] athletics, puzzles, basic cooking, and reading      Prior Functioning/Environment Prior Level of Function : Independent/Modified Independent;Driving;Patient poor historian/Family not available (unsure of accuracy of PLOF report by patient, info taken from previous admission in May 2025)             Mobility Comments: She did not use an AD inside the home, however used a cane outside the home. ADLs Comments: She was independent with ADLs, cooking, cleaning, and driving. Pt does state Channing comes and helps her anytime she needs but also states she comes 1x/wk    OT Problem List: Decreased activity tolerance;Decreased strength;Decreased range of motion;Impaired balance (sitting and/or standing);Decreased cognition;Decreased safety awareness;Decreased knowledge of use of DME or AE   OT Treatment/Interventions: Therapeutic exercise;Self-care/ADL training;DME and/or AE instruction;Therapeutic activities;Patient/family education;Balance training      OT Goals(Current goals can be found in the care plan section)   Acute Rehab OT Goals Patient Stated Goal: get back in bed OT Goal Formulation: With patient Time For Goal Achievement: 01/15/24 Potential to Achieve Goals: Good ADL Goals Pt Will Perform  Grooming: with set-up;sitting Pt Will Perform Upper Body Dressing: with set-up;sitting Pt Will Perform Lower Body Dressing: with min assist;sit to/from  stand Pt Will Transfer to Toilet: with min assist;ambulating Pt Will Perform Toileting - Clothing Manipulation and hygiene: with min assist;sit to/from stand Additional ADL Goal #1: Pt will maintain seated balance EOB with no assit for at least 5 min to demonstrate activity tolerance for participation in ADL   OT Frequency:  Min 2X/week    Co-evaluation              AM-PAC OT 6 Clicks Daily Activity     Outcome Measure Help from another person eating meals?: A Little Help from another person taking care of personal grooming?: A Little Help from another person toileting, which includes using toliet, bedpan, or urinal?: A Lot Help from another person bathing (including washing, rinsing, drying)?: A Lot Help from another person to put on and taking off regular upper body clothing?: A Little Help from another person to put on and taking off regular lower body clothing?: A Lot 6 Click Score: 15   End of Session Equipment Utilized During Treatment: Gait belt Nurse Communication: Mobility status;Precautions  Activity Tolerance: Patient tolerated treatment well Patient left: in bed;with call bell/phone within reach;with bed alarm set  OT Visit Diagnosis: Unsteadiness on feet (R26.81);History of falling (Z91.81);Muscle weakness (generalized) (M62.81);Other abnormalities of gait and mobility (R26.89)                Time: 8581-8556 OT Time Calculation (min): 25 min Charges:  OT General Charges $OT Visit: 1 Visit OT Evaluation $OT Eval Moderate Complexity: 1 Mod OT Treatments $Self Care/Home Management : 8-22 mins  Leita DEL OTR/L Acute Rehabilitation Services Office: [phone]  Leita JINNY Odea 01/01/2024, 5:20 PM

## 2024-01-01 NOTE — Discharge Instructions (Signed)

## 2024-01-01 NOTE — Progress Notes (Addendum)
 "  Progress Note  Patient Name: Lisa Lang Date of Encounter: 01/01/2024 Primary Cardiologist: Alm Clay, MD   Subjective   Overnight no events. Niece Lisa Lang and Lisa Lang also at bedside.  Vital Signs    Vitals:   12/31/23 0801 12/31/23 1505 12/31/23 1947 01/01/24 0438  BP: (!) 120/53 (!) 123/53 92/67 (!) 139/52  Pulse: 80 66 63 70  Resp: 16 16 18 18   Temp: 98.1 F (36.7 C) 98.7 F (37.1 C) 98.8 F (37.1 C) 98 F (36.7 C)  TempSrc: Oral Oral Oral Oral  SpO2: 94% 94% 94% 92%  Weight:      Height:        Intake/Output Summary (Last 24 hours) at 01/01/2024 0800 Last data filed at 12/31/2023 1945 Gross per 24 hour  Intake 100 ml  Output --  Net 100 ml   Filed Weights   12/30/23 1458  Weight: 43.1 kg    Physical Exam   GEN: No acute distress.   Neck: No JVD Cardiac: RRR, no  rubs, or gallops. Systolic and diastolic murmurs Respiratory: Clear to auscultation bilaterally. GI: Soft, nontender, non-distended  MS: No edema  Labs   Telemetry: SR with BIFB   Chemistry Recent Labs  Lab 12/30/23 1536 12/31/23 1038  NA 138 134*  K 4.6 4.0  CL 101 100  CO2 25 20*  GLUCOSE 104* 86  BUN 20 24*  CREATININE 0.84 0.80  CALCIUM  9.1 9.0  PROT  --  5.6*  ALBUMIN  --  3.2*  AST  --  232*  ALT  --  209*  ALKPHOS  --  103  BILITOT  --  1.4*  GFRNONAA >60 >60  ANIONGAP 12 14     Hematology Recent Labs  Lab 12/30/23 1536 12/31/23 1038  WBC 9.2 10.1  RBC 4.77 4.40  HGB 14.9 13.7  HCT 45.6 41.2  MCV 95.6 93.6  MCH 31.2 31.1  MCHC 32.7 33.3  RDW 14.3 14.3  PLT 177 168    Cardiac Enzymes  Recent Labs  Lab 12/30/23 1536 12/30/23 1700 12/31/23 1038  TRNPT 44* 135* 207*      Cardiac Studies   Cardiac Studies & Procedures   ______________________________________________________________________________________________     ECHOCARDIOGRAM  ECHOCARDIOGRAM COMPLETE 12/31/2023  Narrative ECHOCARDIOGRAM REPORT    Patient Name:    Lisa Lang Date of Exam: 12/31/2023 Medical Rec #:  [id number]     Height:       62.0 in Accession #:    [id number]    Weight:       95.0 lb Date of Birth:  [birth year]      BSA:          1.393 m Patient Age:    89 years      BP:           120/53 mmHg Patient Gender: F             HR:           75 bpm. Exam Location:  Inpatient  Procedure: 2D Echo, Cardiac Doppler and Color Doppler (Both Spectral and Color Flow Doppler were utilized during procedure).  Indications:    Elevated Troponin  History:        Patient has prior history of Echocardiogram examinations, most recent 10/25/2021. Arrythmias:Atrial Fibrillation; Risk Factors:Dyslipidemia.  Sonographer:    Philomena Daring Referring Phys: EMERY LITTIE FUSS  IMPRESSIONS   1. Left ventricular ejection fraction, by estimation, is 65 to 70%. The  left ventricle has normal function. The left ventricle has no regional wall motion abnormalities. Left ventricular diastolic parameters are indeterminate. 2. Right ventricular systolic function is normal. The right ventricular size is normal. 3. Left atrial size was severely dilated. 4. Diastolic doming and thickening of the leaflet tips with anterior valve prolapse. Cannot exclude small annular disjuction. The mitral valve is abnormal. Mild mitral valve regurgitation. No evidence of mitral stenosis. 5. Multiple jets of eccentric tricuspid regurgitation; tricuspid prolapse. The tricuspid valve is abnormal. Tricuspid valve regurgitation is moderate. 6. The aortic valve was not well visualized. Aortic valve regurgitation is mild to moderate. Mild aortic valve stenosis. Aortic valve mean gradient measures 16.0 mmHg. Aortic valve Vmax measures 2.52 m/s.  Comparison(s): Prior images reviewed side by side. Mild increase in aortic valve gradients.  FINDINGS Left Ventricle: Left ventricular ejection fraction, by estimation, is 65 to 70%. The left ventricle has normal function. The left ventricle has no  regional wall motion abnormalities. The left ventricular internal cavity size was small. There is no left ventricular hypertrophy. Left ventricular diastolic parameters are indeterminate.  Right Ventricle: The right ventricular size is normal. No increase in right ventricular wall thickness. Right ventricular systolic function is normal.  Left Atrium: Left atrial size was severely dilated.  Right Atrium: Right atrial size was normal in size.  Pericardium: There is no evidence of pericardial effusion.  Mitral Valve: Diastolic doming and thickening of the leaflet tips with anterior valve prolapse. Cannot exclude small annular disjuction. The mitral valve is abnormal. Mild mitral valve regurgitation. No evidence of mitral valve stenosis.  Tricuspid Valve: Multiple jets of eccentric tricuspid regurgitation; tricuspid prolapse. The tricuspid valve is abnormal. Tricuspid valve regurgitation is moderate.  Aortic Valve: The aortic valve was not well visualized. Aortic valve regurgitation is mild to moderate. Mild aortic stenosis is present. Aortic valve mean gradient measures 16.0 mmHg. Aortic valve peak gradient measures 25.4 mmHg. Aortic valve area, by VTI measures 1.59 cm.  Pulmonic Valve: The pulmonic valve was normal in structure. Pulmonic valve regurgitation is mild. No evidence of pulmonic stenosis.  Aorta: The aortic root and ascending aorta are structurally normal, with no evidence of dilitation.  IAS/Shunts: No atrial level shunt detected by color flow Doppler.   LEFT VENTRICLE PLAX 2D LVIDd:         3.10 cm   Diastology LVIDs:         1.90 cm   LV e' medial:    4.35 cm/s LV PW:         1.00 cm   LV E/e' medial:  21.2 LV IVS:        1.00 cm   LV e' lateral:   5.87 cm/s LVOT diam:     2.10 cm   LV E/e' lateral: 15.7 LV SV:         65 LV SV Index:   47 LVOT Area:     3.46 cm   RIGHT VENTRICLE             IVC RV Basal diam:  3.50 cm     IVC diam: 1.60 cm RV Mid diam:    2.40  cm RV S prime:     14.50 cm/s TAPSE (M-mode): 2.5 cm  LEFT ATRIUM             Index        RIGHT ATRIUM           Index LA diam:  2.50 cm 1.79 cm/m   RA Area:     17.20 cm LA Vol (A2C):   63.4 ml 45.54 ml/m  RA Volume:   43.50 ml  31.22 ml/m LA Vol (A4C):   69.8 ml 50.10 ml/m LA Biplane Vol: 75.8 ml 54.41 ml/m AORTIC VALVE AV Area (Vmax):    1.33 cm AV Area (Vmean):   1.60 cm AV Area (VTI):     1.59 cm AV Vmax:           252.00 cm/s AV Vmean:          146.800 cm/s AV VTI:            0.407 m AV Peak Grad:      25.4 mmHg AV Mean Grad:      16.0 mmHg LVOT Vmax:         96.70 cm/s LVOT Vmean:        67.850 cm/s LVOT VTI:          0.188 m LVOT/AV VTI ratio: 0.46  AORTA Ao Root diam: 2.60 cm Ao Asc diam:  3.00 cm  MITRAL VALVE               TRICUSPID VALVE MV Area (PHT): 3.89 cm    TR Peak grad:   27.5 mmHg MV Decel Time: 195 msec    TR Vmax:        262.00 cm/s MV E velocity: 92.20 cm/s MV A velocity: 88.00 cm/s  SHUNTS MV E/A ratio:  1.05        Systemic VTI:  0.19 m Systemic Diam: 2.10 cm  Stanly Leavens MD Electronically signed by Stanly Leavens MD Signature Date/Time: 12/31/2023/12:16:39 PM    Final   TEE  ECHO TEE 10/28/2021  Narrative TRANSESOPHOGEAL ECHO REPORT    Patient Name:   LENZI MARMO Date of Exam: 10/28/2021 Medical Rec #:  [id number]     Height:       62.0 in Accession #:    [id number]    Weight:       108.2 lb Date of Birth:  01-Apr-1927      BSA:          1.472 m Patient Age:    89 years      BP:           91/61 mmHg Patient Gender: F             HR:           110 bpm. Exam Location:  Inpatient  Procedure: TEE-Intraopertive, 3D Echo, Cardiac Doppler and Color Doppler  Indications:     mitral regurgitation. atrial fibrillation  History:         Patient has prior history of Echocardiogram examinations, most recent 10/25/2021. Mitral Valve Prolapse; Risk Factors:Dyslipidemia.  Sonographer:     Tinnie Barefoot  RDCS Referring Phys:  [id number] POWELL BRAVO [place]ON Diagnosing Phys: Powell Sorrow MD  PROCEDURE: After discussion of the risks and benefits of a TEE, an informed consent was obtained from the patient. The transesophogeal probe was passed without difficulty through the esophogus of the patient. Sedation performed by different physician. The patient was monitored while under deep sedation. Anesthestetic sedation was provided intravenously by Anesthesiology: 178mg  of Propofol . The patient developed no complications during the procedure. A direct current cardioversion was performed.  IMPRESSIONS   1. Left ventricular ejection fraction, by estimation, is 65 to 70%. The left ventricle has normal function. 2. Right ventricular systolic function is normal.  The right ventricular size is normal. 3. Left atrial size was moderately dilated. No left atrial/left atrial appendage thrombus was detected. 4. Right atrial size was moderately dilated. 5. 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 Overall, mitral regurgitation appears moderate (EROA of posterior jet 0.2cm2, RVol 21mL). There is no systolic flow reversal in the pulmonary veins. PASP is +RAP. SABRA The mitral valve is myxomatous. Moderate mitral valve regurgitation. 6. Tricuspid valve regurgitation is mild to moderate. 7. The aortic valve is tricuspid. There is mild calcification of the aortic valve. There is mild thickening of the aortic valve. Aortic valve regurgitation is mild. Aortic valve sclerosis/calcification is present, without any evidence of aortic stenosis. 8. Following TEE, the patient underwent successful DCCV with 150J x1.  FINDINGS Left Ventricle: Left ventricular ejection fraction, by estimation, is 65 to 70%.  The left ventricle has normal function. The left ventricular internal cavity size was normal in size.  Right Ventricle: The right ventricular size is normal. No increase in right ventricular wall thickness. Right ventricular systolic function is normal.  Left Atrium: Left atrial size was moderately dilated. No left atrial/left atrial appendage thrombus was detected.  Right Atrium: Right atrial size was moderately dilated.  Pericardium: There is no evidence of pericardial effusion.  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 Overall, mitral regurgitation appears moderate (EROA of posterior jet 0.2cm2, RVol 21mL). There is no systolic flow reversal in the pulmonary veins. PASP is +RAP. The mitral valve is myxomatous. Moderate mitral valve regurgitation.  Tricuspid Valve: The tricuspid valve is normal in structure. Tricuspid valve regurgitation is mild to moderate.  Aortic Valve: The aortic valve is tricuspid. There is mild calcification of the aortic valve. There is mild thickening of the aortic valve. Aortic valve regurgitation is mild. Aortic valve sclerosis/calcification is present, without any evidence of aortic stenosis.  Pulmonic Valve: The pulmonic valve was normal in structure. Pulmonic valve regurgitation is trivial.  Aorta: The aortic root is normal in size and structure.  IAS/Shunts: The atrial septum is grossly normal.   MR Peak grad:    92.5 mmHg MR Mean grad:    55.0 mmHg MR Vmax:         481.00 cm/s MR Vmean:        343.0 cm/s MR PISA:         2.26 cm MR PISA Eff ROA: 18 mm MR PISA Radius:  0.60 cm  Powell Sorrow MD Electronically signed by Powell Sorrow MD Signature Date/Time: 10/28/2021/11:20:18  AM    Final  MONITORS  LONG TERM MONITOR (3-14 DAYS) 01/26/2022  Narrative Patch Wear Time:  9 days and 5 hours  Predominant rhythm was sinus rhythm Less than 1% ventricular and supraventricular ectopy No triggered episodes recorded  Will Camnitz, MD       ______________________________________________________________________________________________        Assessment & Plan   NSTEMI Type 2 myocardial infarction from rhabdomyolysis and influenza A infection.  - DOAC because of AF  Transaminitis (likely drug-induced) Elevated AST and ALT levels, likely drug-induced. Atorvastatin  held until resolution. - Held atorvastatin  until transaminitis  resolves, ok to resume at DC; if transaminitis does not normalize at DC, statin can be resumed as outpatient (PCP or Cards)  Multiple valvular heart disease (mitral valve prolapse with regurgitation, aortic and tricuspid regurgitation) - Multiple valvular heart disease with mitral valve prolapse, mild mitral regurgitation, aortic valve regurgitation, and moderate tricuspid regurgitation. - No severe valve disease noted. - at outpatient cardiology visit, can decide SDM screening    Persistent atrial fibrillation status post cardioversion Bifascicular block with first degree AV block Persistent atrial fibrillation status post cardioversion in November. Currently in sinus rhythm with prolonged first degree AV block and right bundle branch block. - Continue amiodarone  - Discontinued metoprolol  in the setting of BIFB with no issues - Monitor on telemetry    Rhabdomyolysis - Continue hydration as per TRH   Influenza A infection - Continue supportive care   Discussed with family and friends; will sign off.  Will arrange f/u with clinical cardiology   For questions or updates, please contact [facility] Please consult [email or web address] for contact info under Cardiology/STEMI.      Stanly Leavens, MD FASE  [facility] Cardiologist [facility]  [address], [address] [place], [place] [address] [phone]  8:00 AM     "

## 2024-01-01 NOTE — Plan of Care (Signed)
   Problem: Education: Goal: Knowledge of General Education information will improve Description: Including pain rating scale, medication(s)/side effects and non-pharmacologic comfort measures Outcome: Progressing   Problem: Clinical Measurements: Goal: Will remain free from infection Outcome: Progressing   Problem: Clinical Measurements: Goal: Diagnostic test results will improve Outcome: Progressing

## 2024-01-01 NOTE — Progress Notes (Signed)
 "                                                                                                                                                                                                          Daily Progress Note   Patient Name: Lisa Lang       Date: 01/01/2024 DOB: 06/17/1927  Age: 89 y.o. MRN#: [id number] Attending Physician: Christobal Guadalajara, MD Primary Care Physician: Alys Schuyler HERO, [place] Admit Date: 12/30/2023  Reason for Consultation/Follow-up: Establishing goals of care  Length of Stay: 2  Current Medications: Scheduled Meds:   amiodarone   200 mg Oral Daily   apixaban   2.5 mg Oral BID   calcium  carbonate  1,250 mg Oral Q breakfast   feeding supplement  237 mL Oral BID BM   levothyroxine   50 mcg Oral Q0600   multivitamin with minerals  1 tablet Oral Daily   oseltamivir   30 mg Oral Daily   pantoprazole   40 mg Oral Daily   tacrolimus   1 Application Topical BID    Continuous Infusions:  cefTRIAXone  (ROCEPHIN )  IV 1 g (01/01/24 1152)   lactated ringers  75 mL/hr at 01/01/24 1151    PRN Meds: acetaminophen  **OR** acetaminophen , artificial tears, morphine  injection, ondansetron  **OR** ondansetron  (ZOFRAN ) IV  Physical Exam Vitals reviewed.  Constitutional:      General: She is not in acute distress. HENT:     Head: Normocephalic and atraumatic.  Cardiovascular:     Rate and Rhythm: Normal rate.  Pulmonary:     Effort: Pulmonary effort is normal.  Skin:    General: Skin is warm and dry.  Neurological:     Mental Status: She is alert.  Psychiatric:        Mood and Affect: Mood normal.        Behavior: Behavior normal.             Vital Signs: BP (!) 111/46 (BP Location: Right Arm)   Pulse 64   Temp 98.3 F (36.8 C) (Oral)   Resp 17   Ht 5' 2 (1.575 m)   Wt 43.1 kg   SpO2 93%   BMI 17.38 kg/m  SpO2: SpO2: 93 % O2 Device: O2 Device: Room Air O2 Flow Rate:      Patient Active Problem List   Diagnosis Date Noted   Acute metabolic  encephalopathy 01/01/2024   Troponin I above reference range 12/30/2023   Influenza A 12/30/2023   Weakness generalized 12/30/2023   Subarachnoid hematoma (HCC) 06/02/2023   Fall, initial encounter 01/04/2023  On amiodarone  therapy 01/08/2022   Acute cholecystitis 11/21/2021   Choledocholithiasis 11/21/2021   Sepsis (HCC) 11/21/2021   Dehydration 11/21/2021   Acute cystitis 11/21/2021   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 female  with past medical history of HLD, paroxysmal atrial fibrillation on anticoagulation, osteoarthritis, and mitral valve prolapse admitted on 12/30/2023 after an unwitnessed fall.    Patient flu A positive. All x-ray showed no evidence of fractures or traumatic injury. Patient however has rising troponins. She has been admitted with traumatic fall and possible early rhabdomyolysis. Patient is confused which is not her baseline. Started on empiric rocephin  for suspected UTI.   Today's Discussion: Reviewed chart. Cardiology consulted for elevated troponin- type 2 myocardial infarction likely related to mild rhabdomyolysis and influenza A infection. Today cardiology signed off and patient will follow up after discharge.  Patient sitting up in bed with her niece and care aid/helper at bedside. Patient is alert and oriented. Her niece shared she still has moments of confusion but overall she is much improved from yesterday. When I asked the patient how she feels she tells me much better than  yesterday. Patient does not remember talking to me yesterday. I introduced Palliative Medicine as specialized medical care for people living with serious illness. It focuses on providing relief from symptoms and stress of a serious illness. The goal is to improve quality of life for both the patient and the family.   We discussed allowing time for outcomes. I shared my worry that she might not be able to go home directly after discharge. She understands she will likely require more assistance than she did previously. Encouraged patient to work with therapies while hospitalized and work to improve oral intake.   1:40 pm Called patient's son Lisa Lang. He spoke to his mother earlier and feels she is much improved. She was oriented without confusion when he spoke to her. Lisa Lang is reading the Hard Choices booklet.  We discussed the plan to discuss goals of care and code status with the patient during this hospitalization.  Emotional support and therapeutic listening provided. Questions and concerns were addressed. Discussed the importance of continued conversation with family and the medical providers regarding overall plan of care and treatment options, ensuring decisions are within the context of the patient's values and GOCs. PMT will continue to support.  Recommendations/Plan: Continue full code and full scope of care Time for outcomes/improvement HCPOA and living will to be placed in Vyn[facility] Continued PMT support    Code Status:    Code Status Orders  (From admission, onward)           Start     Ordered   12/30/23 1906  Full code  Continuous       Question:  By:  Answer:  Consent: discussion documented in EHR   12/30/23 1907          Extensive chart review has been completed prior to seeing the patient including labs, vital signs, imaging, progress/consult notes, orders, medications, and available advance directive documents.  Care plan was  discussed with bedside RN  Time spent:  50 minutes  Thank you for allowing the Palliative Medicine Team to assist in the care of this patient.     Stephane CHRISTELLA Palin, NP  Please contact Palliative Medicine Team phone at [phone] for questions and concerns.       "

## 2024-01-01 NOTE — TOC Initial Note (Signed)
 Transition of Care [facility]) - Initial/Assessment Note    Patient Details  Name: Lisa Lang MRN: [id number] Date of Birth: [birth year]  Transition of Care [facility]) CM/SW Contact:    Darin JULIANNA Bend, LCSW Phone Number: 01/01/2024, 2:20 PM  Clinical Narrative:                 CSW followed-up disposition recommendations (SNF placement).  CSW completed initial TOC work-up/assessment as noted by the following below.   CSW spoke with the patient to review SNF referral process per clinical recommendations and assessed the pt's SNF preference.   The patient expressed no snf preference.  The CSW contacted the patient's natural support (Patt-- Niece) and reviewed the clinical recommendations and assess SNF preference. The natural support expressed the following to contact her son [name] who is in [place] .   The CSW attempted to contact the patient's natural support (Son gordon,bobby [phone]) to update them to the patient's disposition. CSW was unable to make contact with the natural support. CSW   CSW updated the bedside nurse and additional clinical members to the information above regarding SNF initial efforts for SNF placement.    CSW referral efforts to support the patient's disposition FL2:  PASRR:  SNF referrals:   Note:   TOC Disposition follow-up needs  Please provide the patient or natural support with bed-offer updates.  Please continue with SNF placement efforts. Please update the clinical team to SNF placement efforts:  No other needs identified by this clinical research associate currently. Patient needs and current disposition to be followed by    Expected Discharge Plan: Skilled Nursing Facility Barriers to Discharge: Continued Medical Work up   Patient Goals and CMS Choice Patient states their goals for this hospitalization and ongoing recovery are:: To get better overall CMS Medicare.gov Compare Post Acute Care list provided to:: Patient Choice offered to / list presented to :  Patient, Adult Children [place] ownership interest in [facility].provided to:: Adult Children    Expected Discharge Plan and Services In-house Referral: Clinical Social Work                                            Prior Living Arrangements/Services   Lives with:: Adult Children   Do you feel safe going back to the place where you live?: Yes      Need for Family Participation in Patient Care: Yes (Comment) Care giver support system in place?: Yes (comment)   Criminal Activity/Legal Involvement Pertinent to Current Situation/Hospitalization: No - Comment as needed  Activities of Daily Living   ADL Screening (condition at time of admission) Independently performs ADLs?: No Does the patient have a NEW difficulty with bathing/dressing/toileting/self-feeding that is expected to last >3 days?: Yes (Initiates electronic notice to provider for possible OT consult) Does the patient have a NEW difficulty with getting in/out of bed, walking, or climbing stairs that is expected to last >3 days?: Yes (Initiates electronic notice to provider for possible PT consult) Does the patient have a NEW difficulty with communication that is expected to last >3 days?: Yes (Initiates electronic notice to provider for possible SLP consult) Is the patient deaf or have difficulty hearing?: No Does the patient have difficulty seeing, even when wearing glasses/contacts?: No Does the patient have difficulty concentrating, remembering, or making decisions?: Yes  Permission Sought/Granted Permission sought to share information with : Case Manager,  Biomedical Scientist granted to share info w Contact Information: Son gordon,bobby  Emotional Assessment       Orientation: : Oriented to Place, Oriented to Self Alcohol  / Substance Use: Not Applicable Psych Involvement: No (comment)  Admission diagnosis:  Weakness generalized [R53.1] Elevated  troponin [R79.89] Elevated creatine kinase [R74.8] Fall, initial encounter [W19.XXXA] Patient Active Problem List   Diagnosis Date Noted   Acute metabolic encephalopathy 01/01/2024   Troponin I above reference range 12/30/2023   Influenza A 12/30/2023   Weakness generalized 12/30/2023   Subarachnoid hematoma (HCC) 06/02/2023   Fall, initial encounter 01/04/2023   On amiodarone  therapy 01/08/2022   Acute cholecystitis 11/21/2021   Choledocholithiasis 11/21/2021   Sepsis (HCC) 11/21/2021   Dehydration 11/21/2021   Acute cystitis 11/21/2021   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) 11/02/2021   A-fib (HCC) 10/24/2021   Hypercoagulable state due to paroxysmal atrial fibrillation (HCC): CHA2DS2-VASc score 6 10/24/2021   Moderate to severe mitral regurgitation 01/14/2020   Fatigue due to treatment 11/14/2017   HLD (hyperlipidemia) 11/09/2017   Arrhythmogenic bileaflet mitral valve prolapse syndrome 11/09/2017   Paroxysmal atrial fibrillation (HCC) 09/27/2017   Ovarian cyst 09/26/2017   PCP:  Alys Schuyler HERO, PA Pharmacy:   [id number] - [address], [place] - [address] AT [facility] OF [address] [place] [address] Phone: [phone] Fax: [phone]  [facility] - [place], [place] - [address] [place] [address] Phone: [phone] Fax: [phone]  [facility] [address] [place] [address] Phone: [phone] Fax: [phone]     Social Drivers of Health (SDOH) Social History: SDOH Screenings   Food Insecurity: No Food Insecurity (12/31/2023)  Housing: Low Risk (12/31/2023)  Transportation Needs: No Transportation Needs (12/31/2023)  Utilities: Not At Risk (12/31/2023)  Social Connections: Socially Isolated (12/31/2023)  Tobacco Use: Low Risk (12/30/2023)   SDOH Interventions:     Readmission Risk  Interventions     No data to display

## 2024-01-01 NOTE — Progress Notes (Signed)
 Initial Nutrition Assessment  DOCUMENTATION CODES:   Underweight  INTERVENTION:   -Liberalize diet to regular for widest variety of meal selections -MVI with minerals daily -Ensure Plus High Protein po BID, each supplement provides 350 kcal and 20 grams of protein  -Magic cup BID with meals, each supplement provides 290 kcal and 9 grams of protein  -RD provided High Calorie, High Protein Nutrition Therapy handout from [facility] Nutrition Care Manual; attached to AVS/ discharge summary   NUTRITION DIAGNOSIS:   Increased nutrient needs related to acute illness as evidenced by estimated needs.  GOAL:   Patient will meet greater than or equal to 90% of their needs  MONITOR:   PO intake, Supplement acceptance  REASON FOR ASSESSMENT:   Consult Assessment of nutrition requirement/status  ASSESSMENT:   89 y.o. female with medical history significant of hyperlipidemia, paroxysmal atrial fibrillation on anticoagulation, mitral valve prolapse, osteoarthritis, who apparently sustained a fall at home.  Patient admitted with influenza A infection.   Reviewed I/O's: +100 ml x 24 hours and +246 ml since admission  Patient unavailable at time of visit. Attempted to speak with patient via call to hospital room phone, however, unable to reach. RD unable to obtain further nutrition-related history or complete nutrition-focused physical exam at this time.    Per cardiology notes, patient with type 2 myocardial infarction from rhabdomyolysis and influenza A infection.  Patient currently on a heart healthy, carb modified diet. No meal completion data available to assess at this time.   Reviewed weight history. Weight have ranged from 43.1-48 kg over the past year. Patient has experienced a 5% weight loss over the past month, which is significant for time frame.   Highly suspect patient with malnutrition given reports of poor oral intake, underweight status, and significant weight loss, however,  unable to identify at this time. Patient would greatly benefit from addition of oral nutrition supplements.   Palliative care following for goals of care discussions. Plan to continue full code and full scope treatment and allow time for outcomes.   Medications reviewed and include calcium  carbonate, protonix , and lactated ringers  infusion @ 75 ml/hr.   Labs reviewed: K: 3.3.    Diet Order:   Diet Order             Diet heart healthy/carb modified Room service appropriate? Yes; Fluid consistency: Thin  Diet effective now                   EDUCATION NEEDS:   Education needs have been addressed  Skin:  Skin Assessment: Reviewed RN Assessment  Last BM:  12/31/23 (type 5)  Height:   Ht Readings from Last 1 Encounters:  12/30/23 5' 2 (1.575 m)    Weight:   Wt Readings from Last 1 Encounters:  12/30/23 43.1 kg    Ideal Body Weight:  50 kg  BMI:  Body mass index is 17.38 kg/m.  Estimated Nutritional Needs:   Kcal:  1300-1500  Protein:  55-70 grams  Fluid:  1.3-1.5 L    Margery ORN, RD, LDN, CDCES Registered Dietitian III Certified Diabetes Care and Education Specialist If unable to reach this RD, please use RD Inpatient group chat on secure chat between hours of 8am-4 pm daily

## 2024-01-02 DIAGNOSIS — G9341 Metabolic encephalopathy: Secondary | ICD-10-CM

## 2024-01-02 DIAGNOSIS — Z515 Encounter for palliative care: Secondary | ICD-10-CM | POA: Diagnosis not present

## 2024-01-02 DIAGNOSIS — Z7189 Other specified counseling: Secondary | ICD-10-CM | POA: Diagnosis not present

## 2024-01-02 LAB — COMPREHENSIVE METABOLIC PANEL WITH GFR
ALT: 148 U/L — ABNORMAL HIGH (ref 0–44)
AST: 144 U/L — ABNORMAL HIGH (ref 15–41)
Albumin: 2.7 g/dL — ABNORMAL LOW (ref 3.5–5.0)
Alkaline Phosphatase: 93 U/L (ref 38–126)
Anion gap: 8 (ref 5–15)
BUN: 21 mg/dL (ref 8–23)
CO2: 25 mmol/L (ref 22–32)
Calcium: 8.3 mg/dL — ABNORMAL LOW (ref 8.9–10.3)
Chloride: 104 mmol/L (ref 98–111)
Creatinine, Ser: 0.75 mg/dL (ref 0.44–1.00)
GFR, Estimated: >60 mL/min
Glucose, Bld: 93 mg/dL (ref 70–99)
Potassium: 4 mmol/L (ref 3.5–5.1)
Sodium: 137 mmol/L (ref 135–145)
Total Bilirubin: 0.8 mg/dL (ref 0.0–1.2)
Total Protein: 4.8 g/dL — ABNORMAL LOW (ref 6.5–8.1)

## 2024-01-02 LAB — CBC
HCT: 36.4 % (ref 36.0–46.0)
Hemoglobin: 12.1 g/dL (ref 12.0–15.0)
MCH: 31 pg (ref 26.0–34.0)
MCHC: 33.2 g/dL (ref 30.0–36.0)
MCV: 93.3 fL (ref 80.0–100.0)
Platelets: 179 K/uL (ref 150–400)
RBC: 3.9 MIL/uL (ref 3.87–5.11)
RDW: 14.4 % (ref 11.5–15.5)
WBC: 7.5 K/uL (ref 4.0–10.5)
nRBC: 0 % (ref 0.0–0.2)

## 2024-01-02 MED ORDER — OSELTAMIVIR PHOSPHATE 30 MG PO CAPS: 30.0000 mg | ORAL_CAPSULE | Freq: Every day | ORAL | Status: AC

## 2024-01-02 MED ORDER — LACTATED RINGERS IV SOLN: INTRAVENOUS | Status: AC

## 2024-01-02 NOTE — Plan of Care (Signed)
  Problem: Education: Goal: Knowledge of General Education information will improve Description: Including pain rating scale, medication(s)/side effects and non-pharmacologic comfort measures Outcome: Progressing   Problem: Clinical Measurements: Goal: Ability to maintain clinical measurements within normal limits will improve Outcome: Progressing   Problem: Clinical Measurements: Goal: Diagnostic test results will improve Outcome: Progressing   

## 2024-01-02 NOTE — TOC Progression Note (Signed)
 Transition of Care [facility]) - Progression Note    Patient Details  Name: Lisa Lang MRN: [id number] Date of Birth: Oct 19, 1927  [facility]) CM/SW Contact  [name] [place], [place] Phone Number: 01/02/2024, 12:36 PM  Clinical Narrative:     CSW called pt's son and provided SNF bed offers. CSW texted bed offers and medicare star ratings to son. ICM will follow for SNF choice.   Expected Discharge Plan: Skilled Nursing Facility Barriers to Discharge: snf choice               Expected Discharge Plan and Services In-house Referral: Clinical Social Work       Expected Discharge Date: 01/02/24                                     Social Drivers of Health (SDOH) Interventions SDOH Screenings   Food Insecurity: No Food Insecurity (01/01/2024)  Housing: Low Risk (01/01/2024)  Transportation Needs: No Transportation Needs (01/01/2024)  Utilities: Not At Risk (01/01/2024)  Social Connections: Socially Isolated (01/01/2024)  Tobacco Use: Low Risk (12/30/2023)    Readmission Risk Interventions     No data to display

## 2024-01-02 NOTE — Discharge Summary (Incomplete)
 Physician Discharge Summary  Lisa Lang FMW:[id number] DOB: Oct 28, 1927 DOA: 12/30/2023  PCP: Alys Schuyler HERO, PA  Admit date: 12/30/2023 Discharge date: 01/02/2024 Recommendations for Outpatient Follow-up:  Follow up with PCP in 1 weeks-call for appointment Please obtain BMP/CBC in one week  Discharge Dispo: SNF Discharge Condition: Stable Code Status:   Code Status: Full Code Diet recommendation:  Diet Order             Diet regular Room service appropriate? Yes; Fluid consistency: Thin  Diet effective now                    Brief/Interim Summary: Lisa Lang is a 89 y.o. female with PMH of persistent AF on Eliquis  recent DCCV on 11/25/2023, aortic atherosclerosis MVP with moderate to severe MR, arthritis, hyperlipidemia, hypothyroidism presented after an unwitnessed fall around 5 AM on 12/26 and found by neighbor inside her house in the afternoon, EMS was brought with hypoxic 87% on room air and placed on 2 L [place] and improved to 98% Patient reported she has fallen asleep on the couch overnight and got up too fast to work, her legs buckled and gave out and she fell at home and could not get off the floor.,  Patient was complaining about pain in her tailbone. In the ED: Vitals stable, blood work showed slight elevated CK, elevated troponin rest of the labs reassuring.  Patient without chest pain and EKG unchanged, tested positive for influenza A Imaging CT head, C-spine, lumbar spine, x-ray pelvis chest xray >No evidence of trauma no acute intracranial mobility or acute fracture. Patient is unable to stand on her own, patient is admitted for further management Workup showed influenza A UTI along with encephalopathy dehydration which improved with IV fluid hydration. PT OT did well recommending SNF mental status improved  Subjective: Seen and examined  Ennever at the bedside who feels patient is improved and back to baseline No new complaints, tolerating p.o. Remains  afebrile BP stable labs shows improving LFTs hemoglobin is stable  Discharge Diagnoses:  *** Assessment and plan:  Unwitnessed fall at home Generalized weakness likely from influenza leading to fall: Imaging with chest x-ray pelvis x-ray CT lumbar spine C-spine, CT head no acute acute traumatic fracture. Had rhabdomyolysis which is improving, on Eliquis  PTA and continued.  Vitamin D  level normal, B12 elevated TSH stable Continue PT OT  plan for skilled nursing facility placement  Acute metabolic encephalopathy UTI: Appears now close to baseline,non focal.At baseline living independently and mobilizing well.UA abnormal Received ceftriaxone  completed 3 doses and discontinue-culture with multiple species   Continue treat underlying cause including UTI influenza deconditioning.  Influenza A: Having cough weakness-continue Tamiflu  renally dosed.  Continue droplet precaution  Diarrhea: Stable.  No sample for testing.  Dehydration Hyponatremia mild Metabolic acidosis: Electrolytes slowly improving replace potassium bicarb improved   Elevated troponin: Demand ischemia type II likely from rhabdomyolysis and influenza, per cardiology no plan for ischemic evaluation continue anticoagulation for A-fib.  Echo completed EF 65 to 70% no RWMA  Persistent A-fib Multiple valvular heart disease/MVP with MR,  AR TR: Recent DCCV 11/25/2023, on renally dosed Eliquis . Toprol  discontinued per cardiology, continue amiodarone   Bifascicular block with first-degree AV block: Metropol has been discontinued.  Mild rhabdomyolysis: Due to fall, CK nicely improving gentle IV fluid hydration .  Monitor while on statin. Recent Labs  Lab 12/30/23 1536 12/31/23 1038 01/01/24 0752  CKTOTAL 653* 679* 427*    HLD: on statins  Moderate  malnutrition with Body mass index is 17.38 kg/m.: Cont supplements. Nutrition Problem: Increased nutrient needs Etiology: acute illness Signs/Symptoms: estimated  needs Interventions: Ensure Enlive (each supplement provides 350kcal and 20 grams of protein), Magic cup, MVI   Mild transaminitis- Suspect from rhabdo. S/p ERCP and laparoscopic cholecystectomy in 2023  GOC: Consult palliative care per son she has a living will and he will fax it and she wishes for natural death She has been full code in the past. I had updated son, he is in [place]. Niece visiting Palliative care is following  Mobility: PT Orders: Active PT Follow up Rec: Skilled Nursing-Short Term Rehab (<3 Hours/Day)12/31/2023 1000   DVT prophylaxis: apixaban  (ELIQUIS ) tablet 2.5 mg Start: 12/31/23 0100 Code Status:   Code Status: Full Code Family Communication: plan of care discussed with patient at bedside.  Previously called her son to update,met niece at bedside. Patient status is: Remains hospitalized because of severity of illness Level of care: Telemetry   Dispo: The patient is from: home aone            Anticipated disposition: SNF once mental status improves  Objective: Vitals last 24 hrs: Vitals:   01/01/24 1409 01/01/24 2100 01/02/24 0520 01/02/24 0753  BP: (!) 124/46 (!) 118/51 (!) 118/52 (!) 117/52  Pulse: 67 72 68 65  Resp:    16  Temp: 98.8 F (37.1 C) 98.6 F (37 C) 98.1 F (36.7 C) 97.7 F (36.5 C)  TempSrc: Oral Oral Oral   SpO2: 96% 95% (!) 89% 90%  Weight:      Height:       Physical Examination: General exam: AAOx3 HEENT:Oral mucosa wet,Ear/Nose WNL grossly. Respiratory system: Bilaterally clear BS  Cardiovascular system: S1 & S2 +, No JVD. Gastrointestinal system: Abdomen soft,NTBS+ Nervous System: Alert, awake, moving all extremities and non focal  and following commands. Extremities: extremities warm, leg edema neg dry skin. Skin: Warm, no rashes. MSK: Normal muscle bulk,tone, power.   Medications reviewed: Scheduled Meds:  amiodarone   200 mg Oral Daily   apixaban   2.5 mg Oral BID   calcium  carbonate  1,250 mg Oral Q breakfast    feeding supplement  237 mL Oral BID BM   levothyroxine   50 mcg Oral Q0600   multivitamin with minerals  1 tablet Oral Daily   oseltamivir   30 mg Oral Daily   pantoprazole   40 mg Oral Daily   Continuous Infusions:  cefTRIAXone  (ROCEPHIN )  IV 1 g (01/01/24 1152)   lactated ringers  75 mL/hr at 01/01/24 1151   Diet: Diet Order             Diet regular Room service appropriate? Yes; Fluid consistency: Thin  Diet effective now                      Consultation: See note.  Discharge Instructions  Discharge Instructions     Discharge instructions   Complete by: As directed    Please call call MD or return to ER for similar or worsening recurring problem that brought you to hospital or if any fever,nausea/vomiting,abdominal pain, uncontrolled pain, chest pain,  shortness of breath or any other alarming symptoms.  Please follow-up your doctor as instructed in a week time and call the office for appointment.  Please avoid alcohol , smoking, or any other illicit substance and maintain healthy habits including taking your regular medications as prescribed.  You were cared for by a hospitalist during your hospital stay. If you have any  questions about your discharge medications or the care you received while you were in the hospital after you are discharged, you can call the unit and ask to speak with the hospitalist on call if the hospitalist that took care of you is not available.  Once you are discharged, your primary care physician will handle any further medical issues. Please note that NO REFILLS for any discharge medications will be authorized once you are discharged, as it is imperative that you return to your primary care physician (or establish a relationship with a primary care physician if you do not have one) for your aftercare needs so that they can reassess your need for medications and monitor your lab values   Increase activity slowly   Complete by: As directed        Allergies as of 01/02/2024       Reactions   Lidocaine  Palpitations, Other (See Comments)   True tachycardia, dizziness, vertigo Shakes, Causes her to cry   Amoxicillin Hives, Itching   Sulfa Antibiotics Hives, Itching   Ciprofloxacin Diarrhea        Medication List     PAUSE taking these medications    atorvastatin  40 MG tablet Wait to take this until: January 09, 2024 Commonly known as: LIPITOR Take 40 mg by mouth at bedtime.       STOP taking these medications    metoprolol  succinate 25 MG 24 hr tablet Commonly known as: TOPROL -XL   Systane Ultra 0.4-0.3 % Soln Generic drug: Polyethyl Glycol-Propyl Glycol   tacrolimus  0.1 % ointment Commonly known as: PROTOPIC        TAKE these medications    acetaminophen  325 MG tablet Commonly known as: TYLENOL  Take 2 tablets (650 mg total) by mouth every 4 (four) hours as needed. What changed: reasons to take this   amiodarone  200 MG tablet Commonly known as: PACERONE  Take 1 tablet (200 mg total) by mouth daily.   calcium  carbonate 600 MG Tabs tablet Commonly known as: OS-CAL Take 600 mg by mouth daily with breakfast.   Eliquis  2.5 MG Tabs tablet Generic drug: apixaban  TAKE 1 TABLET TWICE A DAY   levothyroxine  50 MCG tablet Commonly known as: SYNTHROID  Take 50 mcg by mouth every morning.   MULTIVITAMIN PO Take 1 tablet by mouth daily. Woman 50+   oseltamivir  30 MG capsule Commonly known as: TAMIFLU  Take 1 capsule (30 mg total) by mouth daily for 5 days.   pantoprazole  40 MG tablet Commonly known as: PROTONIX  Take 40 mg by mouth daily.   VITAMIN D  (ERGOCALCIFEROL ) PO Take 1,000 Units by mouth daily.        Follow-up Information     Towner, Olivia M, [place] Follow up in 1 week(s).   Specialty: Internal Medicine Contact information: [address] [place] [address] [phone]                Allergies[1]  The results of significant diagnostics from this  hospitalization (including imaging, microbiology, ancillary and laboratory) are listed below for reference.    Microbiology: Recent Results (from the past 240 hours)  Urine Culture (for pregnant, neutropenic or urologic patients or patients with an indwelling urinary catheter)     Status: Abnormal   Collection Time: 12/30/23  4:40 PM   Specimen: Urine, Clean Catch  Result Value Ref Range Status   Specimen Description URINE, CLEAN CATCH  Final   Special Requests   Final    NONE Performed at [facility]  [facility], [address] [place], [place] [address]    Culture MULTIPLE SPECIES PRESENT, SUGGEST RECOLLECTION (A)  Final   Report Status 01/01/2024 FINAL  Final  Resp panel by RT-PCR (RSV, Flu A&B, Covid) Anterior Nasal Swab     Status: Abnormal   Collection Time: 12/30/23  5:24 PM   Specimen: Anterior Nasal Swab  Result Value Ref Range Status   SARS Coronavirus 2 by RT PCR NEGATIVE NEGATIVE Final   Influenza A by PCR POSITIVE (A) NEGATIVE Final   Influenza B by PCR NEGATIVE NEGATIVE Final    Comment: (NOTE) The Xpert Xpress SARS-CoV-2/FLU/RSV plus assay is intended as an aid in the diagnosis of influenza from Nasopharyngeal swab specimens and should not be used as a sole basis for treatment. Nasal washings and aspirates are unacceptable for Xpert Xpress SARS-CoV-2/FLU/RSV testing.  Fact Sheet for Patients: bloggercourse.com  Fact Sheet for Healthcare Providers: seriousbroker.it  This test is not yet approved or cleared by the [place]  FDA and has been authorized for detection and/or diagnosis of SARS-CoV-2 by FDA under an Emergency Use Authorization (EUA). This EUA will remain in effect (meaning this test can be used) for the duration of the COVID-19 declaration under Section 564(b)(1) of the Act, 21 U.S.C. section 360bbb-3(b)(1), unless the authorization is terminated or revoked.     Resp Syncytial Virus by PCR NEGATIVE  NEGATIVE Final    Comment: (NOTE) Fact Sheet for Patients: bloggercourse.com  Fact Sheet for Healthcare Providers: seriousbroker.it  This test is not yet approved or cleared by the [place]  FDA and has been authorized for detection and/or diagnosis of SARS-CoV-2 by FDA under an Emergency Use Authorization (EUA). This EUA will remain in effect (meaning this test can be used) for the duration of the COVID-19 declaration under Section 564(b)(1) of the Act, 21 U.S.C. section 360bbb-3(b)(1), unless the authorization is terminated or revoked.  Performed at [facility], [address] [place], [place] [address]     Procedures/Studies: ECHOCARDIOGRAM COMPLETE Result Date: 12/31/2023    ECHOCARDIOGRAM REPORT   Patient Name:   GRESIA ISIDORO Date of Exam: 12/31/2023 Medical Rec #:  [id number]     Height:       62.0 in Accession #:    [id number]    Weight:       95.0 lb Date of Birth:  09/03/1927      BSA:          1.393 m Patient Age:    89 years      BP:           120/53 mmHg Patient Gender: F             HR:           75 bpm. Exam Location:  Inpatient Procedure: 2D Echo, Cardiac Doppler and Color Doppler (Both Spectral and Color            Flow Doppler were utilized during procedure). Indications:    Elevated Troponin  History:        Patient has prior history of Echocardiogram examinations, most                 recent 10/25/2021. Arrythmias:Atrial Fibrillation; Risk                 Factors:Dyslipidemia.  Sonographer:    Philomena Daring Referring Phys: EMERY LITTIE FUSS IMPRESSIONS  1. Left ventricular ejection fraction, by estimation, is 65 to 70%. The left ventricle has normal function.  The left ventricle has no regional wall motion abnormalities. Left ventricular diastolic parameters are indeterminate.  2. Right ventricular systolic function is normal. The right ventricular size is normal.  3. Left atrial size was severely dilated.  4.  Diastolic doming and thickening of the leaflet tips with anterior valve prolapse. Cannot exclude small annular disjuction. The mitral valve is abnormal. Mild mitral valve regurgitation. No evidence of mitral stenosis.  5. Multiple jets of eccentric tricuspid regurgitation; tricuspid prolapse. The tricuspid valve is abnormal. Tricuspid valve regurgitation is moderate.  6. The aortic valve was not well visualized. Aortic valve regurgitation is mild to moderate. Mild aortic valve stenosis. Aortic valve mean gradient measures 16.0 mmHg. Aortic valve Vmax measures 2.52 m/s. Comparison(s): Prior images reviewed side by side. Mild increase in aortic valve gradients. FINDINGS  Left Ventricle: Left ventricular ejection fraction, by estimation, is 65 to 70%. The left ventricle has normal function. The left ventricle has no regional wall motion abnormalities. The left ventricular internal cavity size was small. There is no left ventricular hypertrophy. Left ventricular diastolic parameters are indeterminate. Right Ventricle: The right ventricular size is normal. No increase in right ventricular wall thickness. Right ventricular systolic function is normal. Left Atrium: Left atrial size was severely dilated. Right Atrium: Right atrial size was normal in size. Pericardium: There is no evidence of pericardial effusion. Mitral Valve: Diastolic doming and thickening of the leaflet tips with anterior valve prolapse. Cannot exclude small annular disjuction. The mitral valve is abnormal. Mild mitral valve regurgitation. No evidence of mitral valve stenosis. Tricuspid Valve: Multiple jets of eccentric tricuspid regurgitation; tricuspid prolapse. The tricuspid valve is abnormal. Tricuspid valve regurgitation is moderate. Aortic Valve: The aortic valve was not well visualized. Aortic valve regurgitation is mild to moderate. Mild aortic stenosis is present. Aortic valve mean gradient measures 16.0 mmHg. Aortic valve peak gradient measures  25.4 mmHg. Aortic valve area, by VTI measures 1.59 cm. Pulmonic Valve: The pulmonic valve was normal in structure. Pulmonic valve regurgitation is mild. No evidence of pulmonic stenosis. Aorta: The aortic root and ascending aorta are structurally normal, with no evidence of dilitation. IAS/Shunts: No atrial level shunt detected by color flow Doppler.  LEFT VENTRICLE PLAX 2D LVIDd:         3.10 cm   Diastology LVIDs:         1.90 cm   LV e' medial:    4.35 cm/s LV PW:         1.00 cm   LV E/e' medial:  21.2 LV IVS:        1.00 cm   LV e' lateral:   5.87 cm/s LVOT diam:     2.10 cm   LV E/e' lateral: 15.7 LV SV:         65 LV SV Index:   47 LVOT Area:     3.46 cm  RIGHT VENTRICLE             IVC RV Basal diam:  3.50 cm     IVC diam: 1.60 cm RV Mid diam:    2.40 cm RV S prime:     14.50 cm/s TAPSE (M-mode): 2.5 cm LEFT ATRIUM             Index        RIGHT ATRIUM           Index LA diam:        2.50 cm 1.79 cm/m   RA Area:     17.20  cm LA Vol (A2C):   63.4 ml 45.54 ml/m  RA Volume:   43.50 ml  31.22 ml/m LA Vol (A4C):   69.8 ml 50.10 ml/m LA Biplane Vol: 75.8 ml 54.41 ml/m  AORTIC VALVE AV Area (Vmax):    1.33 cm AV Area (Vmean):   1.60 cm AV Area (VTI):     1.59 cm AV Vmax:           252.00 cm/s AV Vmean:          146.800 cm/s AV VTI:            0.407 m AV Peak Grad:      25.4 mmHg AV Mean Grad:      16.0 mmHg LVOT Vmax:         96.70 cm/s LVOT Vmean:        67.850 cm/s LVOT VTI:          0.188 m LVOT/AV VTI ratio: 0.46  AORTA Ao Root diam: 2.60 cm Ao Asc diam:  3.00 cm MITRAL VALVE               TRICUSPID VALVE MV Area (PHT): 3.89 cm    TR Peak grad:   27.5 mmHg MV Decel Time: 195 msec    TR Vmax:        262.00 cm/s MV E velocity: 92.20 cm/s MV A velocity: 88.00 cm/s  SHUNTS MV E/A ratio:  1.05        Systemic VTI:  0.19 m                            Systemic Diam: 2.10 cm Stanly Leavens MD Electronically signed by Stanly Leavens MD Signature Date/Time: 12/31/2023/12:16:39 PM    Final    CT  Lumbar Spine Wo Contrast Result Date: 12/30/2023 EXAM: CT OF THE LUMBAR SPINE WITHOUT CONTRAST 12/30/2023 03:47:11 PM TECHNIQUE: CT of the lumbar spine was performed without the administration of intravenous contrast. Multiplanar reformatted images are provided for review. Automated exposure control, iterative reconstruction, and/or weight based adjustment of the mA/kV was utilized to reduce the radiation dose to as low as reasonably achievable. COMPARISON: CT abdomen and pelvis 11/20/2021. CLINICAL HISTORY: Back trauma, no prior imaging (Age >= 16y). FINDINGS: BONES AND ALIGNMENT: Normal vertebral body heights. Similar subtle grade 1 anterolisthesis of L3 on L4. No evidence of traumatic malalignment. Diffuse osteopenia. Mild degenerative endplate osteophytes at multiple levels. No acute fracture or suspicious bone lesion. DEGENERATIVE CHANGES: Vacuum disc phenomenon at L3-L4 and L5-S1. Mild disc space narrowing also noted at L3-L4 and L5-S1. Small disc bulges at multiple levels. Disc bulge and facet arthrosis at L3-L4 resulting in mild spinal canal stenosis. There is no evidence of high grade osseous spinal canal stenosis within the lumbar spine. SOFT TISSUES: Moderate atherosclerosis of the abdominal aorta and branch vessels. Right upper quadrant surgical clips. Gas within the biliary tree likely related to cholecystectomy. No acute abnormality. IMPRESSION: 1. No evidence of acute traumatic injury. 2. Diffuse osteopenia. 3. Degenerative changes as above. Electronically signed by: Donnice Mania MD 12/30/2023 04:21 PM EST RP Workstation: [id number]   CT Head Wo Contrast Result Date: 12/30/2023 EXAM: CT HEAD AND CERVICAL SPINE 12/30/2023 03:47:11 PM TECHNIQUE: CT of the head and cervical spine was performed without the administration of intravenous contrast. Multiplanar reformatted images are provided for review. Automated exposure control, iterative reconstruction, and/or weight based adjustment of the mA/kV  was utilized to reduce the radiation  dose to as low as reasonably achievable. COMPARISON: CT head and cervical spine 06/02/2023. CLINICAL HISTORY: Polytrauma, blunt. FINDINGS: CT HEAD BRAIN AND VENTRICLES: No acute intracranial hemorrhage. No mass effect or midline shift. No abnormal extra-axial fluid collection. No evidence of acute infarct. No hydrocephalus. Stable background of mild chronic small vessel disease and mild generalized cerebral volume loss. ORBITS: No acute abnormality. SINUSES AND MASTOIDS: Trace of fluid in the right maxillary sinus and bilateral sphenoid sinuses. No acute abnormality in the mastoids. SOFT TISSUES AND SKULL: No acute skull fracture. No acute soft tissue abnormality. Atherosclerotic calcifications of the carotid bulbs. CT CERVICAL SPINE BONES AND ALIGNMENT: No acute fracture or traumatic malalignment. Unchanged 3 mm degenerative anterolisthesis of C4 on C5. DEGENERATIVE CHANGES: Mild degenerative disc disease at C5-C6 without spinal canal stenosis. SOFT TISSUES: No prevertebral soft tissue swelling. IMPRESSION: 1. No acute intracranial abnormality. 2. No acute fracture or traumatic malalignment of the cervical spine. 3. Cerebral Atrophy (ICD10-G31.9). Electronically signed by: Ryan Chess MD 12/30/2023 04:03 PM EST RP Workstation: [id number]   CT Cervical Spine Wo Contrast Result Date: 12/30/2023 EXAM: CT HEAD AND CERVICAL SPINE 12/30/2023 03:47:11 PM TECHNIQUE: CT of the head and cervical spine was performed without the administration of intravenous contrast. Multiplanar reformatted images are provided for review. Automated exposure control, iterative reconstruction, and/or weight based adjustment of the mA/kV was utilized to reduce the radiation dose to as low as reasonably achievable. COMPARISON: CT head and cervical spine 06/02/2023. CLINICAL HISTORY: Polytrauma, blunt. FINDINGS: CT HEAD BRAIN AND VENTRICLES: No acute intracranial hemorrhage. No mass effect or midline  shift. No abnormal extra-axial fluid collection. No evidence of acute infarct. No hydrocephalus. Stable background of mild chronic small vessel disease and mild generalized cerebral volume loss. ORBITS: No acute abnormality. SINUSES AND MASTOIDS: Trace of fluid in the right maxillary sinus and bilateral sphenoid sinuses. No acute abnormality in the mastoids. SOFT TISSUES AND SKULL: No acute skull fracture. No acute soft tissue abnormality. Atherosclerotic calcifications of the carotid bulbs. CT CERVICAL SPINE BONES AND ALIGNMENT: No acute fracture or traumatic malalignment. Unchanged 3 mm degenerative anterolisthesis of C4 on C5. DEGENERATIVE CHANGES: Mild degenerative disc disease at C5-C6 without spinal canal stenosis. SOFT TISSUES: No prevertebral soft tissue swelling. IMPRESSION: 1. No acute intracranial abnormality. 2. No acute fracture or traumatic malalignment of the cervical spine. 3. Cerebral Atrophy (ICD10-G31.9). Electronically signed by: Ryan Chess MD 12/30/2023 04:03 PM EST RP Workstation: [id number]   DG Pelvis Portable Result Date: 12/30/2023 EXAM: 1 or 2 VIEW(S) XRAY OF THE PELVIS 12/30/2023 03:03:52 PM COMPARISON: X-ray pelvis 01/21/2023. CLINICAL HISTORY: trauma/syncope evaluation FINDINGS: BONES AND JOINTS: No acute fracture. No malalignment. SOFT TISSUES: The soft tissues are unremarkable. IMPRESSION: 1. No evidence of acute traumatic injury. Electronically signed by: Morgane Naveau MD 12/30/2023 03:38 PM EST RP Workstation: [id number]   DG Chest Portable 1 View Result Date: 12/30/2023 EXAM: 1 VIEW(S) XRAY OF THE CHEST 12/30/2023 03:03:52 PM COMPARISON: Chest x-ray 10/04/2023, CTC spine 06/02/2023. CLINICAL HISTORY: trauma/syncope evaluation FINDINGS: LUNGS AND PLEURA: Stable coarsened interstitial markings. Biapical pleural and pulmonary scarring. No focal pulmonary opacity. No pleural effusion. No pneumothorax. HEART AND MEDIASTINUM: Stable cardiomegaly. Aortic calcification. BONES  AND SOFT TISSUES: No acute osseous abnormality. IMPRESSION: 1. No acute cardiopulmonary abnormality. Electronically signed by: Morgane Naveau MD 12/30/2023 03:37 PM EST RP Workstation: [id number]    Labs: BNP (last 3 results) No results for input(s): BNP in the last 8760 hours. Basic Metabolic Panel: Recent Labs  Lab 12/30/23 1536 12/31/23 1038 01/01/24  9247 01/02/24 0333  NA 138 134* 136 137  K 4.6 4.0 3.3* 4.0  CL 101 100 100 104  CO2 25 20* 24 25  GLUCOSE 104* 86 79 93  BUN 20 24* 22 21  CREATININE 0.84 0.80 0.71 0.75  CALCIUM  9.1 9.0 8.4* 8.3*   Liver Function Tests: Recent Labs  Lab 12/31/23 1038 01/01/24 0752 01/02/24 0333  AST 232* 185* 144*  ALT 209* 177* 148*  ALKPHOS 103 91 93  BILITOT 1.4* 1.0 0.8  PROT 5.6* 5.0* 4.8*  ALBUMIN 3.2* 2.9* 2.7*   No results for input(s): LIPASE, AMYLASE in the last 168 hours. No results for input(s): AMMONIA in the last 168 hours. CBC: Recent Labs  Lab 12/30/23 1536 12/31/23 1038 01/01/24 0752 01/02/24 0333  WBC 9.2 10.1 9.0 7.5  HGB 14.9 13.7 12.6 12.1  HCT 45.6 41.2 37.7 36.4  MCV 95.6 93.6 93.5 93.3  PLT 177 168 158 179   CBG: No results for input(s): GLUCAP in the last 168 hours. Hgb A1c No results for input(s): HGBA1C in the last 72 hours. Anemia work up Recent Labs    12/31/23 1038  VITAMINB12 1,454*  Cardiac Enzymes: Recent Labs  Lab 12/30/23 1536 12/31/23 1038 01/01/24 0752  CKTOTAL 653* 679* 427*   BNP: Invalid input(s): POCBNP D-Dimer No results for input(s): DDIMER in the last 72 hours. Lipid Profile No results for input(s): CHOL, HDL, LDLCALC, TRIG, CHOLHDL, LDLDIRECT in the last 72 hours. Thyroid  function studies Recent Labs    12/31/23 1038  TSH 4.390   Urinalysis    Component Value Date/Time   COLORURINE YELLOW 12/30/2023 1640   APPEARANCEUR HAZY (A) 12/30/2023 1640   LABSPEC 1.015 12/30/2023 1640   PHURINE 6.0 12/30/2023 1640   GLUCOSEU NEGATIVE  12/30/2023 1640   HGBUR NEGATIVE 12/30/2023 1640   BILIRUBINUR NEGATIVE 12/30/2023 1640   KETONESUR 5 (A) 12/30/2023 1640   PROTEINUR 100 (A) 12/30/2023 1640   UROBILINOGEN 0.2 08/22/2012 0056   NITRITE NEGATIVE 12/30/2023 1640   LEUKOCYTESUR TRACE (A) 12/30/2023 1640   Sepsis Labs Recent Labs  Lab 12/30/23 1536 12/31/23 1038 01/01/24 0752 01/02/24 0333  WBC 9.2 10.1 9.0 7.5   Microbiology Recent Results (from the past 240 hours)  Urine Culture (for pregnant, neutropenic or urologic patients or patients with an indwelling urinary catheter)     Status: Abnormal   Collection Time: 12/30/23  4:40 PM   Specimen: Urine, Clean Catch  Result Value Ref Range Status   Specimen Description URINE, CLEAN CATCH  Final   Special Requests   Final    NONE Performed at [facility], [address], [place], [place] [address]    Culture MULTIPLE SPECIES PRESENT, SUGGEST RECOLLECTION (A)  Final   Report Status 01/01/2024 FINAL  Final  Resp panel by RT-PCR (RSV, Flu A&B, Covid) Anterior Nasal Swab     Status: Abnormal   Collection Time: 12/30/23  5:24 PM   Specimen: Anterior Nasal Swab  Result Value Ref Range Status   SARS Coronavirus 2 by RT PCR NEGATIVE NEGATIVE Final   Influenza A by PCR POSITIVE (A) NEGATIVE Final   Influenza B by PCR NEGATIVE NEGATIVE Final    Comment: (NOTE) The Xpert Xpress SARS-CoV-2/FLU/RSV plus assay is intended as an aid in the diagnosis of influenza from Nasopharyngeal swab specimens and should not be used as a sole basis for treatment. Nasal washings and aspirates are unacceptable for Xpert Xpress SARS-CoV-2/FLU/RSV testing.  Fact Sheet for Patients: bloggercourse.com  Fact Sheet for Healthcare Providers: seriousbroker.it  This test is not yet approved or cleared by the [place]  FDA and has been authorized for detection and/or diagnosis of SARS-CoV-2 by FDA under an Emergency Use Authorization  (EUA). This EUA will remain in effect (meaning this test can be used) for the duration of the COVID-19 declaration under Section 564(b)(1) of the Act, 21 U.S.C. section 360bbb-3(b)(1), unless the authorization is terminated or revoked.     Resp Syncytial Virus by PCR NEGATIVE NEGATIVE Final    Comment: (NOTE) Fact Sheet for Patients: bloggercourse.com  Fact Sheet for Healthcare Providers: seriousbroker.it  This test is not yet approved or cleared by the [place]  FDA and has been authorized for detection and/or diagnosis of SARS-CoV-2 by FDA under an Emergency Use Authorization (EUA). This EUA will remain in effect (meaning this test can be used) for the duration of the COVID-19 declaration under Section 564(b)(1) of the Act, 21 U.S.C. section 360bbb-3(b)(1), unless the authorization is terminated or revoked.  Performed at [facility], [address], [place], [place] [address]      Time coordinating discharge:  35 minutes  SIGNED: Mennie LAMY, MD  [facility] 01/02/2024, 10:54 AM  If 7PM-7AM, please contact night-coverage [email or web address]       [1]  Allergies Allergen Reactions   Lidocaine  Palpitations and Other (See Comments)    True tachycardia, dizziness, vertigo Shakes, Causes her to cry   Amoxicillin Hives and Itching   Sulfa Antibiotics Hives and Itching   Ciprofloxacin Diarrhea

## 2024-01-02 NOTE — Progress Notes (Signed)
 " PROGRESS NOTE Lisa Lang  FMW:[id number] DOB: May 16, 1927 DOA: 12/30/2023 PCP: Alys Schuyler HERO, PA  Brief Narrative/Hospital Course: Lisa Lang is a 89 y.o. female with PMH of persistent AF on Eliquis  recent DCCV on 11/25/2023, aortic atherosclerosis MVP with moderate to severe MR, arthritis, hyperlipidemia, hypothyroidism presented after an unwitnessed fall around 5 AM on 12/26 and found by neighbor inside her house in the afternoon, EMS was brought with hypoxic 87% on room air and placed on 2 L Theresa and improved to 98% Patient reported she has fallen asleep on the couch overnight and got up too fast to work, her legs buckled and gave out and she fell at home and could not get off the floor.,  Patient was complaining about pain in her tailbone. In the ED: Vitals stable, blood work showed slight elevated CK, elevated troponin rest of the labs reassuring.  Patient without chest pain and EKG unchanged, tested positive for influenza A Imaging CT head, C-spine, lumbar spine, x-ray pelvis chest xray >No evidence of trauma no acute intracranial mobility or acute fracture. Patient is unable to stand on her own, patient is admitted for further management Workup showed influenza A UTI along with encephalopathy dehydration which improved with IV fluid hydration. PT OT did well recommending SNF mental status improved  Subjective: Seen and examined  Ennever at the bedside who feels patient is improved and back to baseline No new complaints, tolerating p.o. Remains afebrile BP stable labs shows improving LFTs hemoglobin is stable  Assessment and plan:  Unwitnessed fall at home Generalized weakness likely from influenza leading to fall: Imaging with chest x-ray pelvis x-ray CT lumbar spine C-spine, CT head no acute acute traumatic fracture. Had rhabdomyolysis which is improving, on Eliquis  PTA and continued.  Vitamin D  level normal, B12 elevated TSH stable Continue PT OT  plan for skilled nursing  facility placement  Acute metabolic encephalopathy UTI: Appears now close to baseline,non focal.At baseline living independently and mobilizing well.UA abnormal Received ceftriaxone  completed 3 doses and discontinue-culture with multiple species   Continue treat underlying cause including UTI influenza deconditioning.  Influenza A: Having cough weakness-continue Tamiflu  renally dosed.  Continue droplet precaution  Diarrhea: Stable.  No sample for testing.  Dehydration Hyponatremia mild Metabolic acidosis: Electrolytes slowly improving replace potassium bicarb improved   Elevated troponin: Demand ischemia type II likely from rhabdomyolysis and influenza, per cardiology no plan for ischemic evaluation continue anticoagulation for A-fib.  Echo completed EF 65 to 70% no RWMA  Persistent A-fib Multiple valvular heart disease/MVP with MR,  AR TR: Recent DCCV 11/25/2023, on renally dosed Eliquis . Toprol  discontinued per cardiology, continue amiodarone   Bifascicular block with first-degree AV block: Metropol has been discontinued.  Mild rhabdomyolysis: Due to fall, CK nicely improving gentle IV fluid hydration .  Monitor while on statin. Recent Labs  Lab 12/30/23 1536 12/31/23 1038 01/01/24 0752  CKTOTAL 653* 679* 427*    HLD: on statins  Moderate malnutrition with Body mass index is 17.38 kg/m.: Cont supplements. Nutrition Problem: Increased nutrient needs Etiology: acute illness Signs/Symptoms: estimated needs Interventions: Ensure Enlive (each supplement provides 350kcal and 20 grams of protein), Magic cup, MVI   Mild transaminitis- Suspect from rhabdo. S/p ERCP and laparoscopic cholecystectomy in 2023  GOC: Consult palliative care per son she has a living will and he will fax it and she wishes for natural death She has been full code in the past. I had updated son, he is in [place]. Niece visiting Palliative care is  following  Mobility: PT Orders: Active PT Follow  up Rec: Skilled Nursing-Short Term Rehab (<3 Hours/Day)12/31/2023 1000   DVT prophylaxis: apixaban  (ELIQUIS ) tablet 2.5 mg Start: 12/31/23 0100 Code Status:   Code Status: Full Code Family Communication: plan of care discussed with patient at bedside.  Previously called her son to update,met niece at bedside. Patient status is: Remains hospitalized because of severity of illness Level of care: Telemetry   Dispo: The patient is from: home aone            Anticipated disposition: SNF once mental status improves  Objective: Vitals last 24 hrs: Vitals:   01/01/24 1409 01/01/24 2100 01/02/24 0520 01/02/24 0753  BP: (!) 124/46 (!) 118/51 (!) 118/52 (!) 117/52  Pulse: 67 72 68 65  Resp:    16  Temp: 98.8 F (37.1 C) 98.6 F (37 C) 98.1 F (36.7 C) 97.7 F (36.5 C)  TempSrc: Oral Oral Oral   SpO2: 96% 95% (!) 89% 90%  Weight:      Height:       Physical Examination: General exam: AAOx3 HEENT:Oral mucosa wet,Ear/Nose WNL grossly. Respiratory system: Bilaterally clear BS  Cardiovascular system: S1 & S2 +, No JVD. Gastrointestinal system: Abdomen soft,NTBS+ Nervous System: Alert, awake, moving all extremities and non focal  and following commands. Extremities: extremities warm, leg edema neg dry skin. Skin: Warm, no rashes. MSK: Normal muscle bulk,tone, power.   Medications reviewed: Scheduled Meds:  amiodarone   200 mg Oral Daily   apixaban   2.5 mg Oral BID   calcium  carbonate  1,250 mg Oral Q breakfast   feeding supplement  237 mL Oral BID BM   levothyroxine   50 mcg Oral Q0600   multivitamin with minerals  1 tablet Oral Daily   oseltamivir   30 mg Oral Daily   pantoprazole   40 mg Oral Daily   Continuous Infusions:  cefTRIAXone  (ROCEPHIN )  IV 1 g (01/01/24 1152)   lactated ringers  75 mL/hr at 01/01/24 1151   Diet: Diet Order             Diet regular Room service appropriate? Yes; Fluid consistency: Thin  Diet effective now                   Data Reviewed: I have  personally reviewed following labs and imaging studies ( see epic result tab) CBC: Recent Labs  Lab 12/30/23 1536 12/31/23 1038 01/01/24 0752 01/02/24 0333  WBC 9.2 10.1 9.0 7.5  HGB 14.9 13.7 12.6 12.1  HCT 45.6 41.2 37.7 36.4  MCV 95.6 93.6 93.5 93.3  PLT 177 168 158 179   CMP: Recent Labs  Lab 12/30/23 1536 12/31/23 1038 01/01/24 0752 01/02/24 0333  NA 138 134* 136 137  K 4.6 4.0 3.3* 4.0  CL 101 100 100 104  CO2 25 20* 24 25  GLUCOSE 104* 86 79 93  BUN 20 24* 22 21  CREATININE 0.84 0.80 0.71 0.75  CALCIUM  9.1 9.0 8.4* 8.3*   GFR: Estimated Creatinine Clearance: 29.3 mL/min (by C-G formula based on SCr of 0.75 mg/dL). Recent Labs  Lab 12/31/23 1038 01/01/24 0752 01/02/24 0333  AST 232* 185* 144*  ALT 209* 177* 148*  ALKPHOS 103 91 93  BILITOT 1.4* 1.0 0.8  PROT 5.6* 5.0* 4.8*  ALBUMIN 3.2* 2.9* 2.7*   No results for input(s): LIPASE, AMYLASE in the last 168 hours. No results for input(s): AMMONIA in the last 168 hours. Coagulation Profile: No results for input(s): INR, PROTIME in the  last 168 hours. Unresulted Labs (From admission, onward)     Start     Ordered   01/01/24 0948  C Difficile Quick Screen w PCR reflex  (C Difficile quick screen w PCR reflex panel )  Once, for 24 hours,   TIMED       References:    CDiff Information Tool   01/01/24 0948           Antimicrobials/Microbiology: Anti-infectives (From admission, onward)    Start     Dose/Rate Route Frequency Ordered Stop   12/31/23 1200  cefTRIAXone  (ROCEPHIN ) 1 g in sodium chloride  0.9 % 100 mL IVPB        1 g 200 mL/hr over 30 Minutes Intravenous Every 24 hours 12/31/23 1048 01/05/24 1159   12/31/23 1100  oseltamivir  (TAMIFLU ) capsule 30 mg        30 mg Oral Daily 12/31/23 0948 01/05/24 0959   12/31/23 0000  oseltamivir  (TAMIFLU ) 30 MG capsule        30 mg Oral Daily 01/02/24 1054 01/05/24 2359         Component Value Date/Time   SDES URINE, CLEAN CATCH 12/30/2023 1640    SPECREQUEST  12/30/2023 1640    NONE Performed at [facility], [address] [place], [place] [address]    CULT MULTIPLE SPECIES PRESENT, SUGGEST RECOLLECTION (A) 12/30/2023 1640   REPTSTATUS 01/01/2024 FINAL 12/30/2023 1640    Procedures:    Mennie LAMY, MD [facility] 01/02/2024, 1:02 PM   "

## 2024-01-02 NOTE — Progress Notes (Signed)
 "                                                                                                                                                                                                          Daily Progress Note   Patient Name: Lisa Lang       Date: 01/02/2024 DOB: 01/11/1927  Age: 89 y.o. MRN#: [id number] Attending Physician: Christobal Guadalajara, MD Primary Care Physician: Alys Schuyler HERO, [place] Admit Date: 12/30/2023  Reason for Consultation/Follow-up: Establishing goals of care  Length of Stay: 3  Current Medications: Scheduled Meds:   amiodarone   200 mg Oral Daily   apixaban   2.5 mg Oral BID   calcium  carbonate  1,250 mg Oral Q breakfast   feeding supplement  237 mL Oral BID BM   levothyroxine   50 mcg Oral Q0600   multivitamin with minerals  1 tablet Oral Daily   oseltamivir   30 mg Oral Daily   pantoprazole   40 mg Oral Daily    Continuous Infusions:  cefTRIAXone  (ROCEPHIN )  IV 1 g (01/01/24 1152)   lactated ringers  75 mL/hr at 01/01/24 1151    PRN Meds: acetaminophen  **OR** acetaminophen , artificial tears, morphine  injection, ondansetron  **OR** ondansetron  (ZOFRAN ) IV  Physical Exam Vitals reviewed.  Constitutional:      General: She is not in acute distress. HENT:     Head: Normocephalic and atraumatic.  Cardiovascular:     Rate and Rhythm: Normal rate.  Pulmonary:     Effort: Pulmonary effort is normal.  Skin:    General: Skin is warm and dry.  Neurological:     Mental Status: She is alert.  Psychiatric:        Mood and Affect: Mood normal.        Behavior: Behavior normal.             Vital Signs: BP (!) 117/52 (BP Location: Right Arm)   Pulse 65   Temp 97.7 F (36.5 C)   Resp 16   Ht 5' 2 (1.575 m)   Wt 43.1 kg   SpO2 90%   BMI 17.38 kg/m  SpO2: SpO2: 90 % O2 Device: O2 Device: Room Air O2 Flow Rate:      Patient Active Problem List   Diagnosis Date Noted   Acute metabolic encephalopathy 01/01/2024   Troponin I above reference  range 12/30/2023   Influenza A 12/30/2023   Weakness generalized 12/30/2023   Subarachnoid hematoma (HCC) 06/02/2023   Fall, initial encounter 01/04/2023   On amiodarone  therapy 01/08/2022   Acute cholecystitis 11/21/2021  Choledocholithiasis 11/21/2021   Sepsis (HCC) 11/21/2021   Dehydration 11/21/2021   Acute cystitis 11/21/2021   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 female  with past medical history of HLD, paroxysmal atrial fibrillation on anticoagulation, osteoarthritis, and mitral valve prolapse admitted on 12/30/2023 after an unwitnessed fall.    Patient flu A positive. All x-ray showed no evidence of fractures or traumatic injury. Patient however has rising troponins. She has been admitted with traumatic fall and possible early rhabdomyolysis. Patient is confused which is not her baseline. Started on empiric rocephin  for suspected UTI.   Today's Discussion: Reviewed chart. Therapy is recommending SNF rehab at discharge.  Patient sitting up in bed with her neighbor [name] at bedside. Patient is alert and oriented.She ate the majority of her breakfast tray. She says she feels good.  We discussed goals of care. Patient has living will and her son [name] is her HCPOA. Patient shared she has had a very good life and she understands that at age 36 she may only have a few more years to live. Quality of life is more important to her than quantity of life. She would never want to be a vegetable or have long term  life support. She is open to trialing medical interventions if she will have a meaningful recover. We discuss code status. She would like to remain full code at this time but plans to discuss further with her son. I will bring a Hard Choices booklet so she can review the CPR chapter.  10:20 am: Spoke with patient's son [name]. He plans to discuss code status and desired interventions with his mother. He plans to visit in the upcoming weeks. Discussed plan to discharge to SNF rehab.  Emotional support and therapeutic listening provided. Questions and concerns were addressed. Discussed the importance of continued conversation with family and the medical providers regarding overall plan of care and treatment options, ensuring decisions are within the context of the patient's values and GOCs. PMT will continue to support.  Recommendations/Plan: Continue full code and full scope of care Time for outcomes/improvement HCPOA and living will to be placed in [facility] Continued PMT support    Code Status:    Code Status Orders  (From admission, onward)           Start     Ordered   12/30/23 1906  Full code  Continuous       Question:  By:  Answer:  Consent: discussion documented in EHR   12/30/23 1907          Extensive chart review has been completed prior to seeing the patient including labs, vital signs, imaging, progress/consult notes, orders, medications, and available advance directive documents.   Time spent: 50 minutes  Thank you for allowing the Palliative Medicine Team to assist in the care of this patient.     Stephane CHRISTELLA Palin, NP  Please contact Palliative Medicine Team phone at [phone] for questions and concerns.       "

## 2024-01-03 DIAGNOSIS — G9341 Metabolic encephalopathy: Secondary | ICD-10-CM | POA: Diagnosis not present

## 2024-01-03 DIAGNOSIS — M6281 Muscle weakness (generalized): Secondary | ICD-10-CM

## 2024-01-03 DIAGNOSIS — J101 Influenza due to other identified influenza virus with other respiratory manifestations: Principal | ICD-10-CM

## 2024-01-03 DIAGNOSIS — R7989 Other specified abnormal findings of blood chemistry: Secondary | ICD-10-CM

## 2024-01-03 DIAGNOSIS — E785 Hyperlipidemia, unspecified: Secondary | ICD-10-CM

## 2024-01-03 NOTE — Progress Notes (Signed)
 Physical Therapy Treatment Patient Details Name: Lisa Lang MRN: [id number] DOB: [birth year] Today's Date: 01/03/2024   History of Present Illness Lisa Lang is a 89 y.o. female who sustained an unwitnessed fall and was found by neighbor several hours later. Pt + flu A. PMH: hyperlipidemia, paroxysmal atrial fibrillation on anticoagulation, mitral valve prolapse, osteoarthritis    PT Comments  Pt making incremental progress towards her physical therapy goals. Pt requiring mod-max assist for functional mobility. Performed squat pivot transfer from bed to chair. From chair, worked on sit to stands with emphasis on anterior weight shift. Pt not quite able to achieve full upright standing position yet. She demonstrates decreased midline awareness today in sitting with R lateral lean. Positioned with pillow in chair to neutral. Patient will benefit from continued inpatient follow up therapy, <3 hours/day.   If plan is discharge home, recommend the following: A lot of help with walking and/or transfers;A lot of help with bathing/dressing/bathroom;Assist for transportation;Help with stairs or ramp for entrance;Supervision due to cognitive status;Assistance with cooking/housework   Can travel by private vehicle     No  Equipment Recommendations  Other (comment) (defer)    Recommendations for Other Services       Precautions / Restrictions Precautions Precautions: Fall Restrictions Weight Bearing Restrictions Per Provider Order: No     Mobility  Bed Mobility Overal bed mobility: Needs Assistance Bed Mobility: Supine to Sit     Supine to sit: Mod assist, HOB elevated, Used rails     General bed mobility comments: Pt initiating BLE movement to edge of bed, hand over hand guidance for use of bed rail, trunk assist to sit up and use of bed pad to scoot hips forward    Transfers Overall transfer level: Needs assistance Equipment used: None Transfers: Bed to chair/wheelchair/BSC, Sit  to/from Stand Sit to Stand: Max assist     Squat pivot transfers: Max assist     General transfer comment: Pt unsuccessful with standing to RW. Performed face to face squat pivot transfer from bed to chair towards R. From chair, worked on sit to stands (pt partially standing)    Ambulation/Gait                   Comptroller Bed    Modified Rankin (Stroke Patients Only)       Balance Overall balance assessment: Mild deficits observed, not formally tested, Needs assistance Sitting-balance support: Feet supported, Bilateral upper extremity supported Sitting balance-Leahy Scale: Fair Sitting balance - Comments: posterior bias   Standing balance support: Bilateral upper extremity supported, During functional activity, Reliant on assistive device for balance Standing balance-Leahy Scale: Poor Standing balance comment: dependent on external support from therapist                            Communication Communication Communication: Impaired Factors Affecting Communication: Difficulty expressing self  Cognition Arousal: Alert Behavior During Therapy: WFL for tasks assessed/performed   PT - Cognitive impairments: Awareness, Memory, Attention, Sequencing, Problem solving                       PT - Cognition Comments: Pt reporting it's the first day she's worked with therapy; attempting to use TV remote as phone Following commands: Impaired Following commands impaired: Follows one step commands with increased time  Cueing Cueing Techniques: Verbal cues, Gestural cues, Tactile cues  Exercises Other Exercises Other Exercises: x3 serial sit to stands    General Comments        Pertinent Vitals/Pain Pain Assessment Pain Assessment: Faces Faces Pain Scale: No hurt    Home Living                          Prior Function            PT Goals (current goals can now be found in the care  plan section) Acute Rehab PT Goals Potential to Achieve Goals: Fair Progress towards PT goals: Progressing toward goals    Frequency    Min 2X/week      PT Plan      Co-evaluation              AM-PAC PT 6 Clicks Mobility   Outcome Measure  Help needed turning from your back to your side while in a flat bed without using bedrails?: A Lot Help needed moving from lying on your back to sitting on the side of a flat bed without using bedrails?: A Lot Help needed moving to and from a bed to a chair (including a wheelchair)?: A Lot Help needed standing up from a chair using your arms (e.g., wheelchair or bedside chair)?: A Lot Help needed to walk in hospital room?: Total Help needed climbing 3-5 steps with a railing? : Total 6 Click Score: 10    End of Session Equipment Utilized During Treatment: Gait belt;Oxygen Activity Tolerance: Patient tolerated treatment well Patient left: in chair;with call bell/phone within reach;with chair alarm set Nurse Communication: Mobility status PT Visit Diagnosis: Unsteadiness on feet (R26.81);Muscle weakness (generalized) (M62.81);History of falling (Z91.81);Difficulty in walking, not elsewhere classified (R26.2)     Time: 8954-8879 PT Time Calculation (min) (ACUTE ONLY): 35 min  Charges:    $Therapeutic Activity: 23-37 mins PT General Charges $$ ACUTE PT VISIT: 1 Visit                     Aleck Daring, PT, DPT Acute Rehabilitation Services Office [phone]    Aleck ONEIDA Daring 01/03/2024, 12:45 PM

## 2024-01-03 NOTE — Progress Notes (Signed)
 " PROGRESS NOTE Lisa Lang  FMW:[id number] DOB: [birth year] DOA: 12/30/2023 PCP: Alys Schuyler HERO, PA  Brief Narrative/Hospital Course: Lisa Lang is a 89 y.o. female with PMH of persistent AF on Eliquis  recent DCCV on 11/25/2023, aortic atherosclerosis MVP with moderate to severe MR, arthritis, hyperlipidemia, hypothyroidism presented after an unwitnessed fall around 5 AM on 12/26 and found by neighbor inside her house in the afternoon, EMS was brought with hypoxic 87% on room air and placed on 2 L [place] and improved to 98% Patient reported she has fallen asleep on the couch overnight and got up too fast to work, her legs buckled and gave out and she fell at home and could not get off the floor.,  Patient was complaining about pain in her tailbone. In the ED: Vitals stable, blood work showed slight elevated CK, elevated troponin rest of the labs reassuring.  Patient without chest pain and EKG unchanged, tested positive for influenza A Imaging CT head, C-spine, lumbar spine, x-ray pelvis chest xray >No evidence of trauma no acute intracranial mobility or acute fracture. Patient is unable to stand on her own, patient is admitted for further management Workup showed influenza A UTI along with encephalopathy dehydration which improved with IV fluid hydration. PT OT did well recommending SNF mental status improved At this time she is waiting for placement  Subjective: Seen and examined  Patient is much more alert awake oriented and back to baseline.  Family at the bedside Overnight remains afebrile BP stable  Assessment and plan  Unwitnessed fall at home Generalized weakness likely from influenza leading to fall: Imaging with chest x-ray pelvis x-ray CT lumbar spine C-spine, CT head no acute acute traumatic fracture. Had rhabdomyolysis which is improving, on Eliquis  PTA and continued.  Vitamin D  level normal, B12 elevated TSH stable Continue PT OT. Pending snf  Acute metabolic  encephalopathy UTI: Improved to close to baseline.At baseline living independently and mobilizing well.UA abnormal-culture with multiple species  Completed antibiotics x 5 doses for UTI. continue PT OT and SNF placement   Influenza A: Continue Tamiflu  renally dosed, droplet precaution supportive care   Diarrhea: Stable.  No sample for testing.  Dehydration Hyponatremia mild Metabolic acidosis: Electrolytes stabilized, at this time taking p.o. continue gentle IV fluid hydration for supportive care   Elevated troponin: Demand ischemia type II likely from rhabdomyolysis and influenza, per cardiology no plan for ischemic evaluation continue anticoagulation for A-fib.  Echo completed EF 65 to 70% no RWMA  Persistent A-fib Multiple valvular heart disease/MVP with MR,  AR TR: Recent DCCV 11/25/2023, on renally dosed Eliquis . Toprol  discontinued per cardiology, continue amiodarone   Bifascicular block with first-degree AV block: Metropol has been discontinued.  Mild rhabdomyolysis: Due to fall, CK nicely improving gentle IV fluid hydration .  Monitor while on statin. Recent Labs  Lab 12/30/23 1536 12/31/23 1038 01/01/24 0752  CKTOTAL 653* 679* 427*    HLD: on statins  Moderate malnutrition with Body mass index is 17.38 kg/m.: Cont supplements. Nutrition Problem: Increased nutrient needs Etiology: acute illness Signs/Symptoms: estimated needs Interventions: Ensure Enlive (each supplement provides 350kcal and 20 grams of protein), Magic cup, MVI   Mild transaminitis- Suspect from rhabdo. S/p ERCP and laparoscopic cholecystectomy in 2023  GOC: Consult palliative care per son she has a living will and he will fax it and she wishes for natural death She has been full code in the past. I had updated son, he is in [place]. Niece visiting Palliative care is following  Mobility: PT Orders: Active PT Follow up Rec: Skilled Nursing-Short Term Rehab (<3 Hours/Day)01/03/2024 1241    DVT prophylaxis: apixaban  (ELIQUIS ) tablet 2.5 mg Start: 12/31/23 0100 Code Status:   Code Status: Full Code Family Communication: plan of care discussed with patient at bedside.  Previously called her son. Updated family and neighbor at bedside  Patient status is: Remains hospitalized because of severity of illness Level of care: Telemetry   Dispo: The patient is from: home [facility]            Anticipated disposition: SNF once bed available  Objective: Vitals last 24 hrs: Vitals:   01/02/24 2136 01/03/24 0441 01/03/24 0511 01/03/24 0721  BP: (!) 124/57 (!) 126/50  (!) 110/50  Pulse: 72 70 72 67  Resp:    16  Temp: 98.1 F (36.7 C) 98.3 F (36.8 C)  98.6 F (37 C)  TempSrc: Oral Oral    SpO2: 90% (!) 85% 92% 94%  Weight:      Height:       Physical Examination: General exam: AAOx2-3 HEENT:Oral mucosa wet,Ear/Nose WNL grossly. Respiratory system: Bilaterally clear BS  Cardiovascular system: S1 & S2 +, No JVD. Gastrointestinal system: Abdomen soft,NTBS+ Nervous System: Alert, awake, moving all extremities and non focal  and following commands. Extremities: extremities warm, leg edema neg dry skin. Skin: Warm, no rashes. MSK: Normal muscle bulk,tone, power.   Data Reviewed: I have personally reviewed following labs and imaging studies ( see epic result tab) CBC: Recent Labs  Lab 12/30/23 1536 12/31/23 1038 01/01/24 0752 01/02/24 0333  WBC 9.2 10.1 9.0 7.5  HGB 14.9 13.7 12.6 12.1  HCT 45.6 41.2 37.7 36.4  MCV 95.6 93.6 93.5 93.3  PLT 177 168 158 179   CMP: Recent Labs  Lab 12/30/23 1536 12/31/23 1038 01/01/24 0752 01/02/24 0333  NA 138 134* 136 137  K 4.6 4.0 3.3* 4.0  CL 101 100 100 104  CO2 25 20* 24 25  GLUCOSE 104* 86 79 93  BUN 20 24* 22 21  CREATININE 0.84 0.80 0.71 0.75  CALCIUM  9.1 9.0 8.4* 8.3*   GFR: Estimated Creatinine Clearance: 29.3 mL/min (by C-G formula based on SCr of 0.75 mg/dL). Recent Labs  Lab 12/31/23 1038 01/01/24 0752  01/02/24 0333  AST 232* 185* 144*  ALT 209* 177* 148*  ALKPHOS 103 91 93  BILITOT 1.4* 1.0 0.8  PROT 5.6* 5.0* 4.8*  ALBUMIN 3.2* 2.9* 2.7*   No results for input(s): LIPASE, AMYLASE in the last 168 hours. No results for input(s): AMMONIA in the last 168 hours. Coagulation Profile: No results for input(s): INR, PROTIME in the last 168 hours. Unresulted Labs (From admission, onward)     Start     Ordered   01/01/24 0948  C Difficile Quick Screen w PCR reflex  (C Difficile quick screen w PCR reflex panel )  Once, for 24 hours,   TIMED       References:    CDiff Information Tool   01/01/24 0948           Antimicrobials/Microbiology: Anti-infectives (From admission, onward)    Start     Dose/Rate Route Frequency Ordered Stop   12/31/23 1200  cefTRIAXone  (ROCEPHIN ) 1 g in sodium chloride  0.9 % 100 mL IVPB        1 g 200 mL/hr over 30 Minutes Intravenous Every 24 hours 12/31/23 1048 01/05/24 1159   12/31/23 1100  oseltamivir  (TAMIFLU ) capsule 30 mg  30 mg Oral Daily 12/31/23 0948 01/05/24 0959   12/31/23 0000  oseltamivir  (TAMIFLU ) 30 MG capsule        30 mg Oral Daily 01/02/24 1054 01/05/24 2359         Component Value Date/Time   SDES URINE, CLEAN CATCH 12/30/2023 1640   SPECREQUEST  12/30/2023 1640    NONE Performed at [facility], [address], [place], [place] [address]    CULT MULTIPLE SPECIES PRESENT, SUGGEST RECOLLECTION (A) 12/30/2023 1640   REPTSTATUS 01/01/2024 FINAL 12/30/2023 1640    Procedures:    Mennie LAMY, MD [facility] 01/03/2024, 1:08 PM   "

## 2024-01-03 NOTE — TOC Progression Note (Addendum)
 Transition of Care [facility]) - Progression Note    Patient Details  Name: Lisa Lang MRN: [id number] Date of Birth: [birth year]  [facility]) CM/SW Contact  Bridget Cordella Simmonds, LCSW Phone Number: 01/03/2024, 12:00 PM  Clinical Narrative:   Pt oriented x2.  CSW informed family asking about [place] and [facility]. Messages sent to both. Per [facility], they need 7-10 days of flu isolation.  [facility] can take pt with  5 days isolation, she is checking on bed status.  CSW left message with pt brother [name].  1145: TC Bobby: Bed offers discussed, he is interested in [facility], [place], [facility].  He needs to talk with his family and will call back by end of day.  Pt appears to be on day 4 of isolation.  Messages sent to [facility] to find out about isolation requirements and bed status.    1300: [facility] now can accept pt tomorrow.  Pt son informed by voicemail.     Expected Discharge Plan: Skilled Nursing Facility Barriers to Discharge: Continued Medical Work up               Expected Discharge Plan and Services In-house Referral: Clinical Social Work       Expected Discharge Date: 01/02/24                                     Social Drivers of Health (SDOH) Interventions SDOH Screenings   Food Insecurity: No Food Insecurity (01/01/2024)  Housing: Low Risk (01/01/2024)  Transportation Needs: No Transportation Needs (01/01/2024)  Utilities: Not At Risk (01/01/2024)  Social Connections: Socially Isolated (01/01/2024)  Tobacco Use: Low Risk (12/30/2023)    Readmission Risk Interventions     No data to display

## 2024-01-03 NOTE — Progress Notes (Signed)
 "                                                                                                                                                                                                         Daily Progress Note   Patient Name: Lisa Lang       Date: 01/03/2024 DOB: [birth year]  Age: 89 y.o. MRN#: [id number] Attending Physician: Christobal Guadalajara, MD Primary Care Physician: Alys Schuyler HERO, [place] Admit Date: 12/30/2023  Reason for Consultation/Follow-up: Establishing goals of care  Subjective: I have reviewed medical records including: EPIC notes: Hospitalist, PT/OT, TOC, PMT, cardiology.   MAR: As needed medications administered in the last 24 hours-none Available advanced directives in ACP: none  Received report from primary RN -no acute concerns.  Went to visit patient at bedside-neighbor/friend/Anne along with neighbor/friend/Cheryl present.  Patient is lying in bed awake, alert, oriented, and able to participate in conversation. No signs or non-verbal gestures of pain or discomfort noted. No respiratory distress, increased work of breathing, or secretions noted.   Patient gives permission to speak openly in front of her friends/support system as they know everything.  Emotional support provided to patient.  Therapeutic listening provided as she reflects over the course of her hospitalization and discussions with PMT provider/Dawn.  Patient indicates she has not yet read the Hard Choices book provided yesterday.  Encourage patient to focus on chapter 1: Cardiopulmonary resuscitation chapter, which is only 2.5 pages.  Encouraged patient to consider DNR/DNI status understanding evidenced based poor outcomes in similar hospitalized patient, as the cause of arrest is likely associated with advanced chronic/terminal illness rather than an easily reversible acute cardio-pulmonary event.  I shared that even if we pursued resuscitation we would not able to resolve the underlying factors. I  explained that DNR/DNI does not change the medical plan and it only comes into effect after a person has arrested (died).  It is a protective measure to keep us  from harming the patient in their last moments of life.  Patient would like additional time to reflect on information and read CPR Chapter.  Friends at bedside stated they would read the chapter with her and discussed today.  Patient was not agreeable to DNR/DNI with understanding that she would receive CPR, defibrillation, ACLS medications, and/or intubation.  If any decisions are made today regarding code status change, they will reach out to PMT.  All questions and concerns addressed. Encouraged to call with questions and/or concerns. PMT card provided.  Length of Stay: 4  Current Medications: Scheduled Meds:   amiodarone   200 mg Oral Daily   apixaban   2.5 mg Oral BID   calcium  carbonate  1,250 mg Oral Q breakfast   feeding supplement  237 mL Oral BID BM   levothyroxine   50 mcg Oral Q0600   multivitamin with minerals  1 tablet Oral Daily   oseltamivir   30 mg Oral Daily   pantoprazole   40 mg Oral Daily    Continuous Infusions:  cefTRIAXone  (ROCEPHIN )  IV 1 g (01/02/24 1055)   lactated ringers  40 mL/hr at 01/02/24 2223    PRN Meds: acetaminophen  **OR** acetaminophen , artificial tears, morphine  injection, ondansetron  **OR** ondansetron  (ZOFRAN ) IV  Physical Exam Vitals and nursing note reviewed.  Constitutional:      General: She is not in acute distress.    Appearance: She is ill-appearing.  Pulmonary:     Effort: Pulmonary effort is normal. No respiratory distress.  Skin:    General: Skin is warm and dry.  Neurological:     Mental Status: She is alert and oriented to person, place, and time.     Motor: Weakness present.  Psychiatric:        Attention and Perception: Attention normal.        Behavior: Behavior is cooperative.        Cognition and Memory: Cognition and memory normal.             Vital Signs: BP  (!) 110/50 (BP Location: Right Arm)   Pulse 67   Temp 98.6 F (37 C)   Resp 16   Ht 5' 2 (1.575 m)   Wt 43.1 kg   SpO2 94%   BMI 17.38 kg/m  SpO2: SpO2: 94 % O2 Device: O2 Device: Nasal Cannula O2 Flow Rate: O2 Flow Rate (L/min): 2 L/min  Intake/output summary: No intake or output data in the 24 hours ending 01/03/24 0911 LBM:   Baseline Weight: Weight: 43.1 kg Most recent weight: Weight: 43.1 kg       Palliative Assessment/Data: PPS 50%      Patient Active Problem List   Diagnosis Date Noted   Acute metabolic encephalopathy 01/01/2024   Troponin I above reference range 12/30/2023   Influenza A 12/30/2023   Weakness generalized 12/30/2023   Subarachnoid hematoma (HCC) 06/02/2023   Fall, initial encounter 01/04/2023   On amiodarone  therapy 01/08/2022   Acute cholecystitis 11/21/2021   Choledocholithiasis 11/21/2021   Sepsis (HCC) 11/21/2021   Dehydration 11/21/2021   Acute cystitis 11/21/2021   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 female  with past medical history of HLD, paroxysmal atrial fibrillation on anticoagulation, osteoarthritis, and mitral valve prolapse admitted on 12/30/2023 after an unwitnessed fall.    Patient flu A positive. All x-ray showed no evidence of fractures or traumatic injury. Patient however has rising troponins. She has been admitted with traumatic fall and possible early rhabdomyolysis. Patient is confused which is not her baseline. Started on empiric rocephin  for  suspected UTI.  Assessment: Principal Problem:   Acute metabolic encephalopathy Active Problems:   Paroxysmal atrial fibrillation (HCC)   HLD (hyperlipidemia)  Fall, initial encounter   Troponin I above reference range   Influenza A   Weakness generalized   Recommendations/Plan: Continue current plan of care Continue full code at this time, patient considering DNR/DNI Plan is for discharge to SNF rehab PMT will continue to follow and support holistically  Goals of Care and Additional Recommendations: Limitations on Scope of Treatment: Full Scope Treatment  Code Status:    Code Status Orders  (From admission, onward)           Start     Ordered   12/30/23 1906  Full code  Continuous       Question:  By:  Answer:  Consent: discussion documented in EHR   12/30/23 1907           Code Status History     Date Active Date Inactive Code Status Order ID Comments User Context   06/02/2023 1813 06/03/2023 2059 Full Code [id number]  Franchot Novel, MD ED   01/04/2023 2154 01/06/2023 1903 Full Code [id number]  Shona Terry SAILOR, DO ED   11/21/2021 0027 11/25/2021 2030 Full Code [id number]  Marcene Eva NOVAK, DO ED   10/24/2021 1750 10/29/2021 1743 Full Code [id number]  Sebastian Lamarr SAUNDERS, PA-C Inpatient   09/26/2017 2352 09/28/2017 1905 Full Code [id number]  Silvester Ales, MD ED       Prognosis:  Unable to determine  Discharge Planning: SNF rehab  Care plan was discussed with primary RN, patient, patient's friends  Thank you for allowing the Palliative Medicine Team to assist in the care of this patient.   Total Time 40 minutes Prolonged Time Billed  no       Jeoffrey CHRISTELLA Sharps, NP  Please contact Palliative Medicine Team phone at [phone] for questions and concerns.   *Portions of this note are a verbal dictation therefore any spelling and/or grammatical errors are due to the Dragon Medical One system interpretation.     "

## 2024-01-04 DIAGNOSIS — I480 Paroxysmal atrial fibrillation: Secondary | ICD-10-CM

## 2024-01-04 DIAGNOSIS — W19XXXA Unspecified fall, initial encounter: Secondary | ICD-10-CM

## 2024-01-04 NOTE — TOC Transition Note (Signed)
 Transition of Care [facility]) - Discharge Note   Patient Details  Name: Lisa Lang MRN: [id number] Date of Birth: October 06, 1927  [facility]) CM/SW Contact:  Bridget Cordella Simmonds, LCSW Phone Number: 01/04/2024, 11:40 AM   Clinical Narrative:   Pt discharging to [facility], room 804a. RN call report to [phone].   PTAR called 1130.   Final next level of care: Skilled Nursing Facility Barriers to Discharge: Barriers Resolved   Patient Goals and CMS Choice Patient states their goals for this hospitalization and ongoing recovery are:: To get better overall CMS Medicare.gov Compare Post Acute Care list provided to:: Patient Choice offered to / list presented to : Patient, Adult Children [place] ownership interest in [facility].provided to:: Adult Children    Discharge Placement              Patient chooses bed at: [facility] Patient to be transferred to facility by: ptar Name of family member notified: son [name] Patient and family notified of of transfer: 01/04/24  Discharge Plan and Services Additional resources added to the After Visit Summary for   In-house Referral: Clinical Social Work                                   Social Drivers of Health (SDOH) Interventions SDOH Screenings   Food Insecurity: No Food Insecurity (01/01/2024)  Housing: Low Risk (01/01/2024)  Transportation Needs: No Transportation Needs (01/01/2024)  Utilities: Not At Risk (01/01/2024)  Social Connections: Socially Isolated (01/01/2024)  Tobacco Use: Low Risk (12/30/2023)     Readmission Risk Interventions     No data to display

## 2024-01-04 NOTE — Progress Notes (Signed)
 "                                                                                                                                                                                                       Daily Progress Note   Patient Name: Lisa Lang       Date: 01/04/2024 DOB: 1927/03/14  Age: 89 y.o. MRN#: [id number] Attending Physician: Christobal Guadalajara, MD Primary Care Physician: Alys Schuyler HERO, [place] Admit Date: 12/30/2023  Reason for Consultation/Follow-up: Establishing goals of care  Subjective: Medical records reviewed including progress notes, labs, imaging, flowsheets for vital signs, I&O. Patient assessed at the bedside.  She endorses some confusion and reports she feels better overall.  Her neighbor is present visiting.   Created space and opportunity for patient's thoughts and feelings on her current illness.  Her neighbor shares that patient has been resistant to discussing CODE STATUS or reading hard choices booklet.  Today, patient tells me that she is not sure what would be best, reflecting on her husband's death and how sudden everything happened with him.  Emotional support and therapeutic listening was provided.  She is open to CPR, but at the same time does not want to undergo unnecessary interventions if they will not work.    I emphasized the likelihood that her cardiac arrest event would likely be due to chronic illness and disease progression rather than an acute reversible event.  I also encouraged her to consider whether she would like to pass peacefully and surrounded by her children as her husband did.  This would also be less likely to occur if she was receiving CPR and additional ACLS protocol.    Outpatient palliative care was explained and offered for additional goals of care conversations.  Patient's neighbor is very adult nurse.  She shares that son [name] will be visiting in just a few days and will definitely continue discussing her wishes with focus on CODE STATUS.  No  other needs at this time.  Questions and concerns addressed. PMT will continue to support holistically.   Length of Stay: 5   Physical Exam Vitals and nursing note reviewed.  Constitutional:      General: She is not in acute distress.    Appearance: She is ill-appearing.  Pulmonary:     Effort: Pulmonary effort is normal. No respiratory distress.  Skin:    General: Skin is warm and dry.  Neurological:     Mental Status: She is alert and oriented to person, place, and time.     Motor: Weakness present.  Psychiatric:  Attention and Perception: Attention normal.        Behavior: Behavior is cooperative.        Cognition and Memory: Cognition and memory normal.             Vital Signs: BP (!) 123/51 (BP Location: Left Arm)   Pulse 77   Temp 99 F (37.2 C)   Resp 16   Ht 5' 2 (1.575 m)   Wt 43.1 kg   SpO2 90%   BMI 17.38 kg/m  SpO2: SpO2: 90 % O2 Device: O2 Device: Room Air O2 Flow Rate: O2 Flow Rate (L/min): 2 L/min       Palliative Assessment/Data: PPS 50%    Palliative Care Assessment & Plan   Patient Profile: 89 y.o. female  with past medical history of HLD, paroxysmal atrial fibrillation on anticoagulation, osteoarthritis, and mitral valve prolapse admitted on 12/30/2023 after an unwitnessed fall.    Patient flu A positive. All x-ray showed no evidence of fractures or traumatic injury. Patient however has rising troponins. She has been admitted with traumatic fall and possible early rhabdomyolysis. Patient is confused which is not her baseline. Started on empiric rocephin  for suspected UTI.  Assessment: Principal Problem:   Acute metabolic encephalopathy Active Problems:   Paroxysmal atrial fibrillation (HCC)   HLD (hyperlipidemia)   Fall, initial encounter   Troponin I above reference range   Influenza A   Weakness generalized   Recommendations/Plan: Patient prefers to continue full code and defers further CODE STATUS conversations to  outpatient setting after her son arrives in town Psychosocial and emotional support provided [facility] consulted for assistance with referral to outpatient palliative care PMT will continue to follow and support as needed   Prognosis:  Unable to determine  Discharge Planning: SNF rehab with outpatient palliative care follow-up   Care plan was discussed with patient, patient's neighbor   Yelitza Reach, PA-C Palliative Medicine Team Team phone # [phone]  Thank you for allowing the Palliative Medicine Team to assist in the care of this patient. Please utilize secure chat with additional questions, if there is no response within 30 minutes please call the above phone number.  Palliative Medicine Team providers are available by phone from 7am to 7pm daily and can be reached through the team cell phone.  Should this patient require assistance outside of these hours, please call the patient's attending physician.   Time Total: 35  Visit consisted of counseling and education dealing with the complex and emotionally intense issues of symptom management and palliative care in the setting of serious and potentially life-threatening illness. Greater than 50% of this time was spent counseling and coordinating care related to the above assessment and plan.  Personally spent 35 minutes in patient care including extensive chart review (labs, imaging, progress/consult notes, vital signs), medically appropraite exam, discussed with treatment team, education to patient, family, and staff, documenting clinical information, medication review and management, coordination of care, and available advanced directive documents.      "

## 2024-01-04 NOTE — Progress Notes (Signed)
 Patient discharged, Important Message Letter to be mailed to patient.

## 2024-01-04 NOTE — TOC Progression Note (Addendum)
 Transition of Care [facility]) - Progression Note    Patient Details  Name: Lisa Lang MRN: [id number] Date of Birth: 10-04-1927  Transition of Care [facility]) CM/SW Contact  Bridget Cordella Simmonds, LCSW Phone Number: 01/04/2024, 8:18 AM  Clinical Narrative:   Message received from pt son [name]: he would like to accept offer at [facility].   CSW confirmed with [facility]: they can receive pt today.  MD notified.   Medicare payer with inpt order 12/26.  Expected Discharge Plan: Skilled Nursing Facility Barriers to Discharge: Continued Medical Work up               Expected Discharge Plan and Services In-house Referral: Clinical Social Work       Expected Discharge Date: 01/02/24                                     Social Drivers of Health (SDOH) Interventions SDOH Screenings   Food Insecurity: No Food Insecurity (01/01/2024)  Housing: Low Risk (01/01/2024)  Transportation Needs: No Transportation Needs (01/01/2024)  Utilities: Not At Risk (01/01/2024)  Social Connections: Socially Isolated (01/01/2024)  Tobacco Use: Low Risk (12/30/2023)    Readmission Risk Interventions     No data to display

## 2024-01-04 NOTE — Progress Notes (Addendum)
 AVS in packet and reviewed with patient and family  talked about high protient high calorie diet.  Scripts also in packet

## 2024-01-04 NOTE — Progress Notes (Signed)
 Attempted to call and give report twice to receiving SNF. No answer

## 2024-01-04 NOTE — Discharge Summary (Signed)
 Physician Discharge Summary  Lisa Lang FMW:[id number] DOB: Nov 08, 1927 DOA: 12/30/2023  PCP: Alys Schuyler HERO, PA  Admit date: 12/30/2023 Discharge date: 01/04/2024 Recommendations for Outpatient Follow-up:  Follow up with PCP in 1 weeks-call for appointment Please obtain BMP/CBC in one week Stop Tamiflu  on day 5 on 01/05/2024  Discharge Dispo: SNF [facility] Discharge Condition: Stable Code Status:   Code Status: Full Code Diet recommendation:  Diet Order             Diet regular Room service appropriate? Yes; Fluid consistency: Thin  Diet effective now                    Brief/Interim Summary: Lisa Lang is a 89 y.o. female with PMH of persistent AF on Eliquis  recent DCCV on 11/25/2023, aortic atherosclerosis MVP with moderate to severe MR, arthritis, hyperlipidemia, hypothyroidism presented after an unwitnessed fall around 5 AM on 12/26 and found by neighbor inside her house in the afternoon, EMS was brought with hypoxic 87% on room air and placed on 2 L [place] and improved to 98% Patient reported she has fallen asleep on the couch overnight and got up too fast to work, her legs buckled and gave out and she fell at home and could not get off the floor.,  Patient was complaining about pain in her tailbone. In the ED: Vitals stable, blood work showed slight elevated CK, elevated troponin rest of the labs reassuring.  Patient without chest pain and EKG unchanged, tested positive for influenza A Imaging CT head, C-spine, lumbar spine, x-ray pelvis chest xray >No evidence of trauma no acute intracranial mobility or acute fracture. Patient is unable to stand on her own, patient is admitted for further management Workup showed influenza A UTI along with encephalopathy dehydration which improved with IV fluid hydration. PT OT did well recommending SNF mental status improved At this time she is waiting for placement-and remains medically stable  Subjective: Seen and examined   Patient remains alert awake interactive Remains afebrile vital stable Eager to go to SNF  Assessment and plan  Unwitnessed fall at home Generalized weakness likely from influenza leading to fall: Imaging with chest x-ray pelvis x-ray CT lumbar spine C-spine, CT head no acute acute traumatic fracture. Had rhabdomyolysis which got better with IV fluid hydration. Vitamin D  level normal, B12 elevated TSH stable  Mentation much improved overall improving strength, PT recommending SNF  Acute metabolic encephalopathy UTI: Mental status improved close to baseline.At baseline living independently and mobilizing well. UA abnormal-culture with multiple species-Compling antibiotics today  Influenza A: Complte renally dosed  Tamiflu  x 5 days on 12/31, cont droplet precaution x 7 days.  Has been afebrile  Diarrhea: Stable.  No sample for testing.  Dehydration Hyponatremia mild Metabolic acidosis: Electrolytes stabilized and now  taking p.o well. Off ivf  Elevated troponin: Demand ischemia type II likely from rhabdomyolysis and influenza-seen by cardiology no further recommendation Echo completed EF 65 to 70% no RWMA  Persistent A-fib Multiple valvular heart disease/MVP with MR,  AR TR: Recent DCCV 11/25/2023, on renally dosed Eliquis . Toprol  discontinued 2/2 below per cardiology, continue amiodarone   Bifascicular block with first-degree AV block: Metropol has been discontinued.  Mild rhabdomyolysis: Due to fall, CK nicely improving gentle IV fluid hydration .  Monitor while on statin. Recent Labs  Lab 12/30/23 1536 12/31/23 1038 01/01/24 0752  CKTOTAL 653* 679* 427*    HLD: on statins-resume on dc.  Moderate malnutrition with Body mass index is  17.38 kg/m.: Cont supplements. Nutrition Problem: Increased nutrient needs Etiology: acute illness Signs/Symptoms: estimated needs Interventions: Ensure Enlive (each supplement provides 350kcal and 20 grams of protein), Magic cup,  MVI   Mild transaminitis- Suspect from rhabdo. S/p ERCP and laparoscopic cholecystectomy in 2023  GOC: Consult palliative care per son she has a living will and he will fax it and she wishes for natural death She has been full code in the past. I had updated son, he is in [place]. Niece visiting Palliative care is following  Mobility: PT Orders: Active PT Follow up Rec: Skilled Nursing-Short Term Rehab (<3 Hours/Day)01/03/2024 1241   DVT prophylaxis: apixaban  (ELIQUIS ) tablet 2.5 mg Start: 12/31/23 0100 Code Status:   Code Status: Full Code Family Communication: plan of care discussed with patient at bedside.  Previously called her son.  Updated family and neighbor at bedside multiple occasions Patient status is: Remains hospitalized because of severity of illness Level of care: Telemetry   Dispo: The patient is from: home aone            Anticipated disposition: SNF  Objective: Vitals last 24 hrs: Vitals:   01/03/24 0721 01/03/24 1946 01/04/24 0246 01/04/24 0721  BP: (!) 110/50 (!) 124/52 (!) 146/62 (!) 123/51  Pulse: 67 75 89 77  Resp: 16   16  Temp: 98.6 F (37 C) 98.5 F (36.9 C) 97.6 F (36.4 C) 99 F (37.2 C)  TempSrc:  Axillary Oral   SpO2: 94% 93% 93% 90%  Weight:      Height:       Physical Examination: General exam: AAO x3 HEENT:Oral mucosa wet,Ear/Nose WNL grossly. Respiratory system: Bilaterally clear Cardiovascular system: S1 & S2 +, No JVD. Gastrointestinal system: Abdomen soft,NTBS+ Nervous System: Alert, awake,non focal, weak overall Extremities: extremities warm, leg edema neg dry skin. Skin: Warm, no rashes. MSK: Normal muscle bulk,tone, power.     Consultation: See note.  Discharge Instructions  Discharge Instructions     Discharge instructions   Complete by: As directed    Please call call MD or return to ER for similar or worsening recurring problem that brought you to hospital or if any fever,nausea/vomiting,abdominal pain, uncontrolled  pain, chest pain,  shortness of breath or any other alarming symptoms.  Please follow-up your doctor as instructed in a week time and call the office for appointment.  Please avoid alcohol , smoking, or any other illicit substance and maintain healthy habits including taking your regular medications as prescribed.  You were cared for by a hospitalist during your hospital stay. If you have any questions about your discharge medications or the care you received while you were in the hospital after you are discharged, you can call the unit and ask to speak with the hospitalist on call if the hospitalist that took care of you is not available.  Once you are discharged, your primary care physician will handle any further medical issues. Please note that NO REFILLS for any discharge medications will be authorized once you are discharged, as it is imperative that you return to your primary care physician (or establish a relationship with a primary care physician if you do not have one) for your aftercare needs so that they can reassess your need for medications and monitor your lab values   Increase activity slowly   Complete by: As directed       Allergies as of 01/04/2024       Reactions   Lidocaine  Palpitations, Other (See Comments)   True  tachycardia, dizziness, vertigo Shakes, Causes her to cry   Amoxicillin Hives, Itching   Sulfa Antibiotics Hives, Itching   Ciprofloxacin Diarrhea        Medication List     PAUSE taking these medications    atorvastatin  40 MG tablet Wait to take this until: January 09, 2024 Commonly known as: LIPITOR Take 40 mg by mouth at bedtime.       STOP taking these medications    metoprolol  succinate 25 MG 24 hr tablet Commonly known as: TOPROL -XL   Systane Ultra 0.4-0.3 % Soln Generic drug: Polyethyl Glycol-Propyl Glycol   tacrolimus  0.1 % ointment Commonly known as: PROTOPIC        TAKE these medications    acetaminophen  325 MG  tablet Commonly known as: TYLENOL  Take 2 tablets (650 mg total) by mouth every 4 (four) hours as needed. What changed: reasons to take this   amiodarone  200 MG tablet Commonly known as: PACERONE  Take 1 tablet (200 mg total) by mouth daily.   calcium  carbonate 600 MG Tabs tablet Commonly known as: OS-CAL Take 600 mg by mouth daily with breakfast.   Eliquis  2.5 MG Tabs tablet Generic drug: apixaban  TAKE 1 TABLET TWICE A DAY   levothyroxine  50 MCG tablet Commonly known as: SYNTHROID  Take 50 mcg by mouth every morning.   MULTIVITAMIN PO Take 1 tablet by mouth daily. Woman 50+   oseltamivir  30 MG capsule Commonly known as: TAMIFLU  Take 1 capsule (30 mg total) by mouth daily for 5 days.   pantoprazole  40 MG tablet Commonly known as: PROTONIX  Take 40 mg by mouth daily.   VITAMIN D  (ERGOCALCIFEROL ) PO Take 1,000 Units by mouth daily.        Follow-up Information     [place], Olivia M, [place] Follow up in 1 week(s).   Specialty: Internal Medicine Contact information: [address] [place] [address] [phone]                Allergies[1]  The results of significant diagnostics from this hospitalization (including imaging, microbiology, ancillary and laboratory) are listed below for reference.    Microbiology: Recent Results (from the past 240 hours)  Urine Culture (for pregnant, neutropenic or urologic patients or patients with an indwelling urinary catheter)     Status: Abnormal   Collection Time: 12/30/23  4:40 PM   Specimen: Urine, Clean Catch  Result Value Ref Range Status   Specimen Description URINE, CLEAN CATCH  Final   Special Requests   Final    NONE Performed at [facility], [address], [place], [place] [address]    Culture MULTIPLE SPECIES PRESENT, SUGGEST RECOLLECTION (A)  Final   Report Status 01/01/2024 FINAL  Final  Resp panel by RT-PCR (RSV, Flu A&B, Covid) Anterior Nasal Swab     Status: Abnormal    Collection Time: 12/30/23  5:24 PM   Specimen: Anterior Nasal Swab  Result Value Ref Range Status   SARS Coronavirus 2 by RT PCR NEGATIVE NEGATIVE Final   Influenza A by PCR POSITIVE (A) NEGATIVE Final   Influenza B by PCR NEGATIVE NEGATIVE Final    Comment: (NOTE) The Xpert Xpress SARS-CoV-2/FLU/RSV plus assay is intended as an aid in the diagnosis of influenza from Nasopharyngeal swab specimens and should not be used as a sole basis for treatment. Nasal washings and aspirates are unacceptable for Xpert Xpress SARS-CoV-2/FLU/RSV testing.  Fact Sheet for Patients: bloggercourse.com  Fact Sheet for Healthcare Providers: seriousbroker.it  This test  is not yet approved or cleared by the [place]  FDA and has been authorized for detection and/or diagnosis of SARS-CoV-2 by FDA under an Emergency Use Authorization (EUA). This EUA will remain in effect (meaning this test can be used) for the duration of the COVID-19 declaration under Section 564(b)(1) of the Act, 21 U.S.C. section 360bbb-3(b)(1), unless the authorization is terminated or revoked.     Resp Syncytial Virus by PCR NEGATIVE NEGATIVE Final    Comment: (NOTE) Fact Sheet for Patients: bloggercourse.com  Fact Sheet for Healthcare Providers: seriousbroker.it  This test is not yet approved or cleared by the [place]  FDA and has been authorized for detection and/or diagnosis of SARS-CoV-2 by FDA under an Emergency Use Authorization (EUA). This EUA will remain in effect (meaning this test can be used) for the duration of the COVID-19 declaration under Section 564(b)(1) of the Act, 21 U.S.C. section 360bbb-3(b)(1), unless the authorization is terminated or revoked.  Performed at [facility], [address] [place], [place] [address]     Procedures/Studies: ECHOCARDIOGRAM COMPLETE Result Date: 12/31/2023     ECHOCARDIOGRAM REPORT   Patient Name:   Lisa Lang Date of Exam: 12/31/2023 Medical Rec #:  [id number]     Height:       62.0 in Accession #:    [id number]    Weight:       95.0 lb Date of Birth:  [birth year]      BSA:          1.393 m Patient Age:    89 years      BP:           120/53 mmHg Patient Gender: F             HR:           75 bpm. Exam Location:  Inpatient Procedure: 2D Echo, Cardiac Doppler and Color Doppler (Both Spectral and Color            Flow Doppler were utilized during procedure). Indications:    Elevated Troponin  History:        Patient has prior history of Echocardiogram examinations, most                 recent 10/25/2021. Arrythmias:Atrial Fibrillation; Risk                 Factors:Dyslipidemia.  Sonographer:    Philomena Daring Referring Phys: EMERY LITTIE FUSS IMPRESSIONS  1. Left ventricular ejection fraction, by estimation, is 65 to 70%. The left ventricle has normal function. The left ventricle has no regional wall motion abnormalities. Left ventricular diastolic parameters are indeterminate.  2. Right ventricular systolic function is normal. The right ventricular size is normal.  3. Left atrial size was severely dilated.  4. Diastolic doming and thickening of the leaflet tips with anterior valve prolapse. Cannot exclude small annular disjuction. The mitral valve is abnormal. Mild mitral valve regurgitation. No evidence of mitral stenosis.  5. Multiple jets of eccentric tricuspid regurgitation; tricuspid prolapse. The tricuspid valve is abnormal. Tricuspid valve regurgitation is moderate.  6. The aortic valve was not well visualized. Aortic valve regurgitation is mild to moderate. Mild aortic valve stenosis. Aortic valve mean gradient measures 16.0 mmHg. Aortic valve Vmax measures 2.52 m/s. Comparison(s): Prior images reviewed side by side. Mild increase in aortic valve gradients. FINDINGS  Left Ventricle: Left ventricular ejection fraction, by estimation, is 65 to 70%. The left ventricle  has normal function. The left  ventricle has no regional wall motion abnormalities. The left ventricular internal cavity size was small. There is no left ventricular hypertrophy. Left ventricular diastolic parameters are indeterminate. Right Ventricle: The right ventricular size is normal. No increase in right ventricular wall thickness. Right ventricular systolic function is normal. Left Atrium: Left atrial size was severely dilated. Right Atrium: Right atrial size was normal in size. Pericardium: There is no evidence of pericardial effusion. Mitral Valve: Diastolic doming and thickening of the leaflet tips with anterior valve prolapse. Cannot exclude small annular disjuction. The mitral valve is abnormal. Mild mitral valve regurgitation. No evidence of mitral valve stenosis. Tricuspid Valve: Multiple jets of eccentric tricuspid regurgitation; tricuspid prolapse. The tricuspid valve is abnormal. Tricuspid valve regurgitation is moderate. Aortic Valve: The aortic valve was not well visualized. Aortic valve regurgitation is mild to moderate. Mild aortic stenosis is present. Aortic valve mean gradient measures 16.0 mmHg. Aortic valve peak gradient measures 25.4 mmHg. Aortic valve area, by VTI measures 1.59 cm. Pulmonic Valve: The pulmonic valve was normal in structure. Pulmonic valve regurgitation is mild. No evidence of pulmonic stenosis. Aorta: The aortic root and ascending aorta are structurally normal, with no evidence of dilitation. IAS/Shunts: No atrial level shunt detected by color flow Doppler.  LEFT VENTRICLE PLAX 2D LVIDd:         3.10 cm   Diastology LVIDs:         1.90 cm   LV e' medial:    4.35 cm/s LV PW:         1.00 cm   LV E/e' medial:  21.2 LV IVS:        1.00 cm   LV e' lateral:   5.87 cm/s LVOT diam:     2.10 cm   LV E/e' lateral: 15.7 LV SV:         65 LV SV Index:   47 LVOT Area:     3.46 cm  RIGHT VENTRICLE             IVC RV Basal diam:  3.50 cm     IVC diam: 1.60 cm RV Mid diam:    2.40 cm RV  S prime:     14.50 cm/s TAPSE (M-mode): 2.5 cm LEFT ATRIUM             Index        RIGHT ATRIUM           Index LA diam:        2.50 cm 1.79 cm/m   RA Area:     17.20 cm LA Vol (A2C):   63.4 ml 45.54 ml/m  RA Volume:   43.50 ml  31.22 ml/m LA Vol (A4C):   69.8 ml 50.10 ml/m LA Biplane Vol: 75.8 ml 54.41 ml/m  AORTIC VALVE AV Area (Vmax):    1.33 cm AV Area (Vmean):   1.60 cm AV Area (VTI):     1.59 cm AV Vmax:           252.00 cm/s AV Vmean:          146.800 cm/s AV VTI:            0.407 m AV Peak Grad:      25.4 mmHg AV Mean Grad:      16.0 mmHg LVOT Vmax:         96.70 cm/s LVOT Vmean:        67.850 cm/s LVOT VTI:  0.188 m LVOT/AV VTI ratio: 0.46  AORTA Ao Root diam: 2.60 cm Ao Asc diam:  3.00 cm MITRAL VALVE               TRICUSPID VALVE MV Area (PHT): 3.89 cm    TR Peak grad:   27.5 mmHg MV Decel Time: 195 msec    TR Vmax:        262.00 cm/s MV E velocity: 92.20 cm/s MV A velocity: 88.00 cm/s  SHUNTS MV E/A ratio:  1.05        Systemic VTI:  0.19 m                            Systemic Diam: 2.10 cm Stanly Leavens MD Electronically signed by Stanly Leavens MD Signature Date/Time: 12/31/2023/12:16:39 PM    Final    CT Lumbar Spine Wo Contrast Result Date: 12/30/2023 EXAM: CT OF THE LUMBAR SPINE WITHOUT CONTRAST 12/30/2023 03:47:11 PM TECHNIQUE: CT of the lumbar spine was performed without the administration of intravenous contrast. Multiplanar reformatted images are provided for review. Automated exposure control, iterative reconstruction, and/or weight based adjustment of the mA/kV was utilized to reduce the radiation dose to as low as reasonably achievable. COMPARISON: CT abdomen and pelvis 11/20/2021. CLINICAL HISTORY: Back trauma, no prior imaging (Age >= 16y). FINDINGS: BONES AND ALIGNMENT: Normal vertebral body heights. Similar subtle grade 1 anterolisthesis of L3 on L4. No evidence of traumatic malalignment. Diffuse osteopenia. Mild degenerative endplate osteophytes at  multiple levels. No acute fracture or suspicious bone lesion. DEGENERATIVE CHANGES: Vacuum disc phenomenon at L3-L4 and L5-S1. Mild disc space narrowing also noted at L3-L4 and L5-S1. Small disc bulges at multiple levels. Disc bulge and facet arthrosis at L3-L4 resulting in mild spinal canal stenosis. There is no evidence of high grade osseous spinal canal stenosis within the lumbar spine. SOFT TISSUES: Moderate atherosclerosis of the abdominal aorta and branch vessels. Right upper quadrant surgical clips. Gas within the biliary tree likely related to cholecystectomy. No acute abnormality. IMPRESSION: 1. No evidence of acute traumatic injury. 2. Diffuse osteopenia. 3. Degenerative changes as above. Electronically signed by: Donnice Mania MD 12/30/2023 04:21 PM EST RP Workstation: [id number]   CT Head Wo Contrast Result Date: 12/30/2023 EXAM: CT HEAD AND CERVICAL SPINE 12/30/2023 03:47:11 PM TECHNIQUE: CT of the head and cervical spine was performed without the administration of intravenous contrast. Multiplanar reformatted images are provided for review. Automated exposure control, iterative reconstruction, and/or weight based adjustment of the mA/kV was utilized to reduce the radiation dose to as low as reasonably achievable. COMPARISON: CT head and cervical spine 06/02/2023. CLINICAL HISTORY: Polytrauma, blunt. FINDINGS: CT HEAD BRAIN AND VENTRICLES: No acute intracranial hemorrhage. No mass effect or midline shift. No abnormal extra-axial fluid collection. No evidence of acute infarct. No hydrocephalus. Stable background of mild chronic small vessel disease and mild generalized cerebral volume loss. ORBITS: No acute abnormality. SINUSES AND MASTOIDS: Trace of fluid in the right maxillary sinus and bilateral sphenoid sinuses. No acute abnormality in the mastoids. SOFT TISSUES AND SKULL: No acute skull fracture. No acute soft tissue abnormality. Atherosclerotic calcifications of the carotid bulbs. CT CERVICAL  SPINE BONES AND ALIGNMENT: No acute fracture or traumatic malalignment. Unchanged 3 mm degenerative anterolisthesis of C4 on C5. DEGENERATIVE CHANGES: Mild degenerative disc disease at C5-C6 without spinal canal stenosis. SOFT TISSUES: No prevertebral soft tissue swelling. IMPRESSION: 1. No acute intracranial abnormality. 2. No acute fracture or traumatic malalignment of the  cervical spine. 3. Cerebral Atrophy (ICD10-G31.9). Electronically signed by: Ryan Chess MD 12/30/2023 04:03 PM EST RP Workstation: [id number]   CT Cervical Spine Wo Contrast Result Date: 12/30/2023 EXAM: CT HEAD AND CERVICAL SPINE 12/30/2023 03:47:11 PM TECHNIQUE: CT of the head and cervical spine was performed without the administration of intravenous contrast. Multiplanar reformatted images are provided for review. Automated exposure control, iterative reconstruction, and/or weight based adjustment of the mA/kV was utilized to reduce the radiation dose to as low as reasonably achievable. COMPARISON: CT head and cervical spine 06/02/2023. CLINICAL HISTORY: Polytrauma, blunt. FINDINGS: CT HEAD BRAIN AND VENTRICLES: No acute intracranial hemorrhage. No mass effect or midline shift. No abnormal extra-axial fluid collection. No evidence of acute infarct. No hydrocephalus. Stable background of mild chronic small vessel disease and mild generalized cerebral volume loss. ORBITS: No acute abnormality. SINUSES AND MASTOIDS: Trace of fluid in the right maxillary sinus and bilateral sphenoid sinuses. No acute abnormality in the mastoids. SOFT TISSUES AND SKULL: No acute skull fracture. No acute soft tissue abnormality. Atherosclerotic calcifications of the carotid bulbs. CT CERVICAL SPINE BONES AND ALIGNMENT: No acute fracture or traumatic malalignment. Unchanged 3 mm degenerative anterolisthesis of C4 on C5. DEGENERATIVE CHANGES: Mild degenerative disc disease at C5-C6 without spinal canal stenosis. SOFT TISSUES: No prevertebral soft tissue  swelling. IMPRESSION: 1. No acute intracranial abnormality. 2. No acute fracture or traumatic malalignment of the cervical spine. 3. Cerebral Atrophy (ICD10-G31.9). Electronically signed by: Ryan Chess MD 12/30/2023 04:03 PM EST RP Workstation: [id number]   DG Pelvis Portable Result Date: 12/30/2023 EXAM: 1 or 2 VIEW(S) XRAY OF THE PELVIS 12/30/2023 03:03:52 PM COMPARISON: X-ray pelvis 01/21/2023. CLINICAL HISTORY: trauma/syncope evaluation FINDINGS: BONES AND JOINTS: No acute fracture. No malalignment. SOFT TISSUES: The soft tissues are unremarkable. IMPRESSION: 1. No evidence of acute traumatic injury. Electronically signed by: Morgane Naveau MD 12/30/2023 03:38 PM EST RP Workstation: [id number]   DG Chest Portable 1 View Result Date: 12/30/2023 EXAM: 1 VIEW(S) XRAY OF THE CHEST 12/30/2023 03:03:52 PM COMPARISON: Chest x-ray 10/04/2023, CTC spine 06/02/2023. CLINICAL HISTORY: trauma/syncope evaluation FINDINGS: LUNGS AND PLEURA: Stable coarsened interstitial markings. Biapical pleural and pulmonary scarring. No focal pulmonary opacity. No pleural effusion. No pneumothorax. HEART AND MEDIASTINUM: Stable cardiomegaly. Aortic calcification. BONES AND SOFT TISSUES: No acute osseous abnormality. IMPRESSION: 1. No acute cardiopulmonary abnormality. Electronically signed by: Morgane Naveau MD 12/30/2023 03:37 PM EST RP Workstation: [id number]    Labs: BNP (last 3 results) No results for input(s): BNP in the last 8760 hours. Basic Metabolic Panel: Recent Labs  Lab 12/30/23 1536 12/31/23 1038 01/01/24 0752 01/02/24 0333  NA 138 134* 136 137  K 4.6 4.0 3.3* 4.0  CL 101 100 100 104  CO2 25 20* 24 25  GLUCOSE 104* 86 79 93  BUN 20 24* 22 21  CREATININE 0.84 0.80 0.71 0.75  CALCIUM  9.1 9.0 8.4* 8.3*   Liver Function Tests: Recent Labs  Lab 12/31/23 1038 01/01/24 0752 01/02/24 0333  AST 232* 185* 144*  ALT 209* 177* 148*  ALKPHOS 103 91 93  BILITOT 1.4* 1.0 0.8  PROT 5.6* 5.0* 4.8*   ALBUMIN 3.2* 2.9* 2.7*   No results for input(s): LIPASE, AMYLASE in the last 168 hours. No results for input(s): AMMONIA in the last 168 hours. CBC: Recent Labs  Lab 12/30/23 1536 12/31/23 1038 01/01/24 0752 01/02/24 0333  WBC 9.2 10.1 9.0 7.5  HGB 14.9 13.7 12.6 12.1  HCT 45.6 41.2 37.7 36.4  MCV 95.6 93.6 93.5 93.3  PLT 177  168 158 179   CBG: No results for input(s): GLUCAP in the last 168 hours. Hgb A1c No results for input(s): HGBA1C in the last 72 hours. Anemia work up No results for input(s): VITAMINB12, FOLATE, FERRITIN, TIBC, IRON, RETICCTPCT in the last 72 hours. Cardiac Enzymes: Recent Labs  Lab 12/30/23 1536 12/31/23 1038 01/01/24 0752  CKTOTAL 653* 679* 427*   BNP: Invalid input(s): POCBNP D-Dimer No results for input(s): DDIMER in the last 72 hours. Lipid Profile No results for input(s): CHOL, HDL, LDLCALC, TRIG, CHOLHDL, LDLDIRECT in the last 72 hours. Thyroid  function studies No results for input(s): TSH, T4TOTAL, T3FREE, THYROIDAB in the last 72 hours.  Invalid input(s): FREET3 Urinalysis    Component Value Date/Time   COLORURINE YELLOW 12/30/2023 1640   APPEARANCEUR HAZY (A) 12/30/2023 1640   LABSPEC 1.015 12/30/2023 1640   PHURINE 6.0 12/30/2023 1640   GLUCOSEU NEGATIVE 12/30/2023 1640   HGBUR NEGATIVE 12/30/2023 1640   BILIRUBINUR NEGATIVE 12/30/2023 1640   KETONESUR 5 (A) 12/30/2023 1640   PROTEINUR 100 (A) 12/30/2023 1640   UROBILINOGEN 0.2 08/22/2012 0056   NITRITE NEGATIVE 12/30/2023 1640   LEUKOCYTESUR TRACE (A) 12/30/2023 1640   Sepsis Labs Recent Labs  Lab 12/30/23 1536 12/31/23 1038 01/01/24 0752 01/02/24 0333  WBC 9.2 10.1 9.0 7.5   Microbiology Recent Results (from the past 240 hours)  Urine Culture (for pregnant, neutropenic or urologic patients or patients with an indwelling urinary catheter)     Status: Abnormal   Collection Time: 12/30/23  4:40 PM   Specimen:  Urine, Clean Catch  Result Value Ref Range Status   Specimen Description URINE, CLEAN CATCH  Final   Special Requests   Final    NONE Performed at [facility], [address], [place], [place] [address]    Culture MULTIPLE SPECIES PRESENT, SUGGEST RECOLLECTION (A)  Final   Report Status 01/01/2024 FINAL  Final  Resp panel by RT-PCR (RSV, Flu A&B, Covid) Anterior Nasal Swab     Status: Abnormal   Collection Time: 12/30/23  5:24 PM   Specimen: Anterior Nasal Swab  Result Value Ref Range Status   SARS Coronavirus 2 by RT PCR NEGATIVE NEGATIVE Final   Influenza A by PCR POSITIVE (A) NEGATIVE Final   Influenza B by PCR NEGATIVE NEGATIVE Final    Comment: (NOTE) The Xpert Xpress SARS-CoV-2/FLU/RSV plus assay is intended as an aid in the diagnosis of influenza from Nasopharyngeal swab specimens and should not be used as a sole basis for treatment. Nasal washings and aspirates are unacceptable for Xpert Xpress SARS-CoV-2/FLU/RSV testing.  Fact Sheet for Patients: bloggercourse.com  Fact Sheet for Healthcare Providers: seriousbroker.it  This test is not yet approved or cleared by the [place]  FDA and has been authorized for detection and/or diagnosis of SARS-CoV-2 by FDA under an Emergency Use Authorization (EUA). This EUA will remain in effect (meaning this test can be used) for the duration of the COVID-19 declaration under Section 564(b)(1) of the Act, 21 U.S.C. section 360bbb-3(b)(1), unless the authorization is terminated or revoked.     Resp Syncytial Virus by PCR NEGATIVE NEGATIVE Final    Comment: (NOTE) Fact Sheet for Patients: bloggercourse.com  Fact Sheet for Healthcare Providers: seriousbroker.it  This test is not yet approved or cleared by the [place]  FDA and has been authorized for detection and/or diagnosis of SARS-CoV-2 by FDA under an Emergency  Use Authorization (EUA). This EUA will remain in effect (meaning this test can be used) for the  duration of the COVID-19 declaration under Section 564(b)(1) of the Act, 21 U.S.C. section 360bbb-3(b)(1), unless the authorization is terminated or revoked.  Performed at [facility], [address], [place], [place] [address]    Time coordinating discharge: 35  minutes  SIGNED: Mennie LAMY, MD  [facility] 01/04/2024, 9:51 AM  If 7PM-7AM, please contact night-coverage [email or web address]       [1]  Allergies Allergen Reactions   Lidocaine  Palpitations and Other (See Comments)    True tachycardia, dizziness, vertigo Shakes, Causes her to cry   Amoxicillin Hives and Itching   Sulfa Antibiotics Hives and Itching   Ciprofloxacin Diarrhea

## 2024-01-04 NOTE — Care Management Important Message (Signed)
 Important Message  Patient Details  Name: Lisa Lang MRN: [id number] Date of Birth: 1927/03/28   Important Message Given:  No     Jennie Laneta Dragon 01/04/2024, 3:04 PM

## 2024-01-12 ENCOUNTER — Emergency Department (HOSPITAL_COMMUNITY)

## 2024-01-12 ENCOUNTER — Other Ambulatory Visit: Payer: Self-pay

## 2024-01-12 ENCOUNTER — Encounter (HOSPITAL_COMMUNITY): Payer: Self-pay | Admitting: Emergency Medicine

## 2024-01-12 ENCOUNTER — Emergency Department (HOSPITAL_COMMUNITY)
Admission: EM | Admit: 2024-01-12 | Discharge: 2024-01-12 | Disposition: A | Discharge status: Skilled Nursing Facility | Attending: Emergency Medicine | Admitting: Emergency Medicine

## 2024-01-12 DIAGNOSIS — S0990XA Unspecified injury of head, initial encounter: Secondary | ICD-10-CM | POA: Insufficient documentation

## 2024-01-12 DIAGNOSIS — Z7901 Long term (current) use of anticoagulants: Secondary | ICD-10-CM | POA: Diagnosis not present

## 2024-01-12 DIAGNOSIS — I480 Paroxysmal atrial fibrillation: Secondary | ICD-10-CM | POA: Insufficient documentation

## 2024-01-12 DIAGNOSIS — W01198A Fall on same level from slipping, tripping and stumbling with subsequent striking against other object, initial encounter: Secondary | ICD-10-CM | POA: Insufficient documentation

## 2024-01-12 DIAGNOSIS — W19XXXA Unspecified fall, initial encounter: Secondary | ICD-10-CM

## 2024-01-12 NOTE — Discharge Instructions (Addendum)
 Your CT scans this evening were reassuring.  Return to the emergency department if you develop any life-threatening symptoms.

## 2024-01-12 NOTE — ED Triage Notes (Signed)
 BIB [facility] from [facility].  Pt is on Zarelto and states she hit left side of head.  Pt  doers not have any complaints and no trauma noted>   BP 150/90 HR 72 RR 15 O2 97% CBG 158

## 2024-01-12 NOTE — ED Provider Notes (Signed)
 " [place] EMERGENCY DEPARTMENT AT [facility] Provider Note   CSN: [id number] Arrival date & time: 01/12/24  0100     Patient presents with: FALL ON BLOOD THINNERS   Lisa Lang is a 89 y.o. female.  Patient with paroxysmal A-fib, currently anticoagulated on Eliquis  presents to the emergency department via EMS from [facility] due to an unwitnessed fall.  Patient states that she slipped and fell hitting the left side of her head.  She denies any other complaints at this time.  Level 2 trauma activated upon arrival due to fall on thinners   HPI     Prior to Admission medications  Medication Sig Start Date End Date Taking? Authorizing Provider  acetaminophen  (TYLENOL ) 325 MG tablet Take 2 tablets (650 mg total) by mouth every 4 (four) hours as needed. Patient taking differently: Take 650 mg by mouth every 4 (four) hours as needed for mild pain (pain score 1-3) or moderate pain (pain score 4-6). 11/25/21   [name], MD  amiodarone  (PACERONE ) 200 MG tablet Take 1 tablet (200 mg total) by mouth daily. 12/19/23   Terra Fairy PARAS, PA-C  apixaban  (ELIQUIS ) 2.5 MG TABS tablet TAKE 1 TABLET TWICE A DAY 10/17/23   [name] ORN, MD  atorvastatin  (LIPITOR) 40 MG tablet Take 40 mg by mouth at bedtime. 09/05/17   [provider]  calcium  carbonate (OS-CAL) 600 MG TABS tablet Take 600 mg by mouth daily with breakfast.    [provider]  levothyroxine  (SYNTHROID ) 50 MCG tablet Take 50 mcg by mouth every morning.    [provider]  Multiple Vitamins-Minerals (MULTIVITAMIN PO) Take 1 tablet by mouth daily. Woman 50+    [provider]  pantoprazole  (PROTONIX ) 40 MG tablet Take 40 mg by mouth daily. 01/11/22   [provider]  VITAMIN D , ERGOCALCIFEROL , PO Take 1,000 Units by mouth daily.    [provider]    Allergies: Lidocaine , Amoxicillin, Sulfa antibiotics, and Ciprofloxacin    Review of Systems  Updated Vital  Signs BP 92/60 (BP Location: Left Arm)   Pulse 76   Temp 98.4 F (36.9 C) (Oral)   Resp 18   Ht 5' 2 (1.575 m)   Wt 43.1 kg   SpO2 99%   BMI 17.38 kg/m   Physical Exam Vitals and nursing note reviewed.  HENT:     Head: Normocephalic and atraumatic.     Comments: No obvious head trauma noted. Eyes:     Extraocular Movements: Extraocular movements intact.     Pupils: Pupils are equal, round, and reactive to light.  Pulmonary:     Effort: Pulmonary effort is normal. No respiratory distress.  Musculoskeletal:        General: No signs of injury.     Cervical back: Normal range of motion. No tenderness.  Skin:    General: Skin is dry.  Neurological:     Mental Status: She is alert.  Psychiatric:        Speech: Speech normal.        Behavior: Behavior normal.     (all labs ordered are listed, but only abnormal results are displayed) Labs Reviewed - No data to display  EKG: None  Radiology: CT Head Wo Contrast Result Date: 01/12/2024 EXAM: CT HEAD AND CERVICAL SPINE 01/12/2024 01:43:17 AM TECHNIQUE: CT of the head and cervical spine was performed without the administration of intravenous contrast. Multiplanar reformatted images are provided for review. Automated exposure control, iterative  reconstruction, and/or weight based adjustment of the mA/kV was utilized to reduce the radiation dose to as low as reasonably achievable. COMPARISON: Comparison from 12/30/2023. CLINICAL HISTORY: Head trauma, minor (Age >= 65y) FINDINGS: CT HEAD BRAIN AND VENTRICLES: No acute intracranial hemorrhage. No mass effect or midline shift. No abnormal extra-axial fluid collection. No evidence of acute infarct. No hydrocephalus. ORBITS: No acute abnormality. SINUSES AND MASTOIDS: No acute abnormality. SOFT TISSUES AND SKULL: No acute skull fracture. No acute soft tissue abnormality. CT CERVICAL SPINE BONES AND ALIGNMENT: No acute fracture or traumatic malalignment. Similar grade 1 anterolisthesis of C4  on C5. DEGENERATIVE CHANGES: No significant degenerative changes. SOFT TISSUES: No prevertebral soft tissue swelling. IMPRESSION: 1. No acute intracranial abnormality. 2. No acute fracture or traumatic malalignment of the cervical spine. Electronically signed by: Gilmore Molt MD 01/12/2024 01:58 AM EST RP Workstation: [id number]   CT Cervical Spine Wo Contrast Result Date: 01/12/2024 EXAM: CT HEAD AND CERVICAL SPINE 01/12/2024 01:43:17 AM TECHNIQUE: CT of the head and cervical spine was performed without the administration of intravenous contrast. Multiplanar reformatted images are provided for review. Automated exposure control, iterative reconstruction, and/or weight based adjustment of the mA/kV was utilized to reduce the radiation dose to as low as reasonably achievable. COMPARISON: Comparison from 12/30/2023. CLINICAL HISTORY: Head trauma, minor (Age >= 65y) FINDINGS: CT HEAD BRAIN AND VENTRICLES: No acute intracranial hemorrhage. No mass effect or midline shift. No abnormal extra-axial fluid collection. No evidence of acute infarct. No hydrocephalus. ORBITS: No acute abnormality. SINUSES AND MASTOIDS: No acute abnormality. SOFT TISSUES AND SKULL: No acute skull fracture. No acute soft tissue abnormality. CT CERVICAL SPINE BONES AND ALIGNMENT: No acute fracture or traumatic malalignment. Similar grade 1 anterolisthesis of C4 on C5. DEGENERATIVE CHANGES: No significant degenerative changes. SOFT TISSUES: No prevertebral soft tissue swelling. IMPRESSION: 1. No acute intracranial abnormality. 2. No acute fracture or traumatic malalignment of the cervical spine. Electronically signed by: Gilmore Molt MD 01/12/2024 01:58 AM EST RP Workstation: [id number]     Procedures   Medications Ordered in the ED - No data to display                                  Medical Decision Making Amount and/or Complexity of Data Reviewed Radiology: ordered.   This patient presents to the ED for concern of fall  on thinners, this involves an extensive number of treatment options, and is a complaint that carries with it a high risk of complications and morbidity.  The differential diagnosis includes fracture, dislocation, intracranial abnormality, others   Co morbidities / Chronic conditions that complicate the patient evaluation  Currently on Eliquis    Additional history obtained:  Additional history obtained from EMR  Imaging Studies ordered:  I ordered imaging studies including CT head and cervical spine without contrast I independently visualized and interpreted imaging which showed no acute findings I agree with the radiologist interpretation   Test / Admission - Considered:  Patient with no acute findings on imaging.  Fall was reportedly mechanical in nature.  No traumatic injury identified.  Patient stable for discharge back to her facility at this time.      Final diagnoses:  Fall, initial encounter    ED Discharge Orders     None          Logan Ubaldo KATHEE DEVONNA 01/12/24 0215    Darra Fonda MATSU, MD 01/12/24 [phone]  "

## 2024-01-25 ENCOUNTER — Encounter: Payer: Self-pay | Admitting: Cardiology

## 2024-01-25 ENCOUNTER — Ambulatory Visit: Attending: Cardiology | Admitting: Cardiology

## 2024-01-25 VITALS — BP 118/62 | HR 74 | Ht 62.0 in | Wt 100.0 lb

## 2024-01-25 DIAGNOSIS — D6869 Other thrombophilia: Secondary | ICD-10-CM | POA: Insufficient documentation

## 2024-01-25 DIAGNOSIS — R5383 Other fatigue: Secondary | ICD-10-CM | POA: Insufficient documentation

## 2024-01-25 DIAGNOSIS — E782 Mixed hyperlipidemia: Secondary | ICD-10-CM | POA: Diagnosis present

## 2024-01-25 DIAGNOSIS — Z79899 Other long term (current) drug therapy: Secondary | ICD-10-CM | POA: Insufficient documentation

## 2024-01-25 DIAGNOSIS — I5032 Chronic diastolic (congestive) heart failure: Secondary | ICD-10-CM | POA: Insufficient documentation

## 2024-01-25 DIAGNOSIS — I480 Paroxysmal atrial fibrillation: Secondary | ICD-10-CM | POA: Insufficient documentation

## 2024-01-25 DIAGNOSIS — I38 Endocarditis, valve unspecified: Secondary | ICD-10-CM | POA: Insufficient documentation

## 2024-01-25 DIAGNOSIS — I340 Nonrheumatic mitral (valve) insufficiency: Secondary | ICD-10-CM | POA: Insufficient documentation

## 2024-01-25 MED ORDER — AMIODARONE HCL 200 MG PO TABS: 100.0000 mg | ORAL_TABLET | Freq: Every day | ORAL | Status: DC

## 2024-01-25 NOTE — Patient Instructions (Signed)
 Medication Instructions:   Decrease Amiodarone  100 mg daily ( 1/2 tablet)  *If you need a refill on your cardiac medications before your next appointment, please call your pharmacy*   Lab Work:please facility Lipid   Liver function panel  If you have labs (blood work) drawn today and your tests are completely normal, you will receive your results only by: MyChart Message (if you have MyChart) OR A paper copy in the mail If you have any lab test that is abnormal or we need to change your treatment, we will call you to review the results.   Testing/Procedures:  Not needed  Follow-Up: At [facility], you and your health needs are our priority.  As part of our continuing mission to provide you with exceptional heart care, we have created designated Provider Care Teams.  These Care Teams include your primary Cardiologist (physician) and Advanced Practice Providers (APPs -  Physician Assistants and Nurse Practitioners) who all work together to provide you with the care you need, when you need it.     Your next appointment:   6 month(s)  The format for your next appointment:   In Person  Provider:   Rollo Louder, PA-C or Damien Braver, NP       .   Other Instructions

## 2024-01-25 NOTE — Progress Notes (Signed)
 " Cardiology Office Note:  .   Date:  01/29/2024  ID:  Lisa Lang, DOB Jan 13, 1927, MRN [id number] PCP: Alys Schuyler HERO, PA  [facility] Providers Cardiologist:  Alm Clay, MD     Chief Complaint  Patient presents with   Follow-up    Due for routine cardiology follow-up for A-fib-recent cardioversion   Hospitalization Follow-up    Hospitalized for fall with mild rhabdo; ER visit for fall.  Currently at [facility] for rehab    Patient Profile: .     Lisa Lang is a 89 y.o. female  with a PMH notable for PAF And Moderate to Severe MR associate with Valvular Cardiomyopathy/CHF who presents here for 67-month follow-up as well as hospital follow-up at the request of Alys Schuyler HERO, [place].  Notable Past Medical History  PAF: 11/06/2021: Successful cardioversion. On amiodarone .  DCCV November 2023 and November 25, 2023. Event monitor 01/26/2022: Predominant rhythm was sinus.  Less than 1% ventricular and supraventricular ectopy.  No triggered episodes. Moderate to severe MR/chronic diastolic heart failure due to valvular disease. Echo 10/25/2021: EF 65 to 70%.  Moderately elevated pulmonary artery systolic pressure.  Moderately dilated bilateral atria.  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  The posterior leaflet is diminutive and there is at least moderate posterior MR.  Severe prolapse of both leaflets of the mitral valve.  Moderate MAC.  Moderate TR.  Moderate calcification/sclerosis of the aortic valve without stenosis.  Mild to moderate AR. Reviewed by Valve Team and felt patient was not favorable for MitraClip and she does not seem interested in discussing open MVR. Hyperlipidemia.  I last saw [name] on May 18, 2023.  She was doing well with no major issues just some mild exercise intolerance and generalized fatigue.  Occasional ankle swelling.  Walking with a cane because of balance following a foot fracture.  No issues with  Eliquis .  Was concerned about her alopecia and usually wears a hat when going out.  We reluctantly continued her on amiodarone  but reduce the dose to 100 mg daily.   She was seen in the A-fib clinic by Fairy Heinrich, PA on October 26, 2023.  Following the ER visit on September 30 for dizziness with heart racing-A-fib RVR.  She was reloaded on amiodarone  200 twice daily for 1 month and then scheduled for cardioversion after follow-up on November 12-remained in A-fib.. => Sent for cardioversion on November 21.  She was continued on 200 mg twice daily amiodarone  feeling better since cardioversion.  Overall feeling better.      Lisa Lang was admitted to [facility] from 12/26-31/2025 after suffering a fall.  Was unwitnessed fall at roughly 5 AM.  She was found by her neighbor that afternoon.  She was hypoxic at 87%.  She noted that she had fallen asleep on the couch overnight and when she woke up to go to the bedroom, she got up too fast and her legs buckled/gave out from underneath her and she fell to the floor.  Was not able to get up.  She was found to have influenza as well as UTI.  She was somewhat encephalopathic and dehydrated.  This improved with IV hydration.  She was treated with Tamiflu .  Due to bradycardia, her metoprolol  was stopped.  She did have mild rhabdomyolysis.  She was discharged to skilled nursing facility [facility])  She subsequently went back to the emergency room on 01/12/2024 after  another fall at [facility].  She apparently slipped and fell hit the left side of her head.  No other complaints.  Thankfully no acute findings on imaging.  No LOC.  She was discharged back to the facility.  Subjective  Discussed the use of AI scribe software for clinical note transcription with the patient, who gave verbal consent to proceed.  History of Present Illness Lisa Lang is a 89 year old female with atrial fibrillation who presents for follow-up after cardioversion and  recent hospitalization. She is accompanied by her son as well as her female caregiver.  Noted during her previous hospitalization was that she does have a living will wish for natural death -however she was Full Code.  She was followed by palliative care during her hospitalization.  She remains at [facility] with plans for ~100 days rehab.  She underwent cardioversion in December to address atrial fibrillation, which successfully restored normal sinus rhythm. During a hospitalization from December 26th to December 31st following a fall, an echocardiogram revealed normal left ventricular function with an ejection fraction of 65-70%, a dilated left atrium, mild mitral valve prolapse with mild regurgitation, and aortic valve sclerosis. She was discharged to [facility] for rehabilitation and returned to the ER on January 8th after another fall, but was discharged back to [facility] without significant findings.  Her current medications include amiodarone  200 mg daily, atorvastatin  40 mg daily, Eliquis  2.5 mg twice daily, Synthroid  75 mcg daily, and Protonix  40 mg daily. Blood pressure ranges from 109/60 to 139/66, and her weight is stable around 98 to 100 pounds. She felt 'really tired, feeling no energy, a little more short of breath and just worn out' during atrial fibrillation, but noticed improvement post-cardioversion.  She is undergoing physical therapy at [facility], experiencing tremors in both hands and legs, especially after repeated exercises. She does not experience heart racing during these activities but feels winded, possibly related to a recent flu infection. She was on oxygen for the first two weeks of rehab but has been off it recently. Her activities of daily living are about 50% of normal, with increased difficulty during repeated physical tasks.  She received flu and COVID vaccinations on December 18th and developed flu symptoms on December 25th. She has a bowel movement about 30 minutes after  breakfast and no other significant gastrointestinal symptoms. She sleeps on one pillow and does not experience orthopnea. Her thyroid  medication was recently increased from 50 mcg to 75 mcg.   Cardiovascular ROS: no chest pain or dyspnea on exertion positive for - tremors but no sensation of irregular heartbeat negative for - edema, irregular heartbeat, orthopnea, palpitations, paroxysmal nocturnal dyspnea, rapid heart rate, shortness of breath, or syncope or near syncope.  No TIA or emesis fugax; melena, hematochezia, hematuria or epistaxis..  ROS:  Review of Systems - Negative except symptoms noted.  Also has some poor p.o. intake/nutrition    Objective   Medications: Eliquis  2.5 mg twice daily; amiodarone  20 mg daily; atorvastatin  40 daily; Protonix  40 mL daily; levothyroxine  50 mcg daily  Studies Reviewed: SABRA   EKG Interpretation Date/Time:  Wednesday January 25 2024 17:04:11 EST Ventricular Rate:  73 PR Interval:  406 QRS Duration:  162 QT Interval:  404 QTC Calculation: 445 R Axis:   165  Text Interpretation: Sinus rhythm with 1st degree A-V block Right bundle branch block Left posterior fascicular block Bifascicular block No significant change since last tracing Confirmed by [name] ([id number]) on 01/25/2024 5:25:53  PM  Prior EKG on 12/30/2023: Sinus rhythm with first degree AV block and biventricular block. Prior EKG on 11/25/2023: Premature atrial contraction. (Independently interpreted)    Chemistry      Component Value Date/Time   NA 137 01/02/2024 0333   NA 141 11/16/2023 1453   K 4.0 01/02/2024 0333   CL 104 01/02/2024 0333   CO2 25 01/02/2024 0333   BUN 21 01/02/2024 0333   BUN 24 11/16/2023 1453   CREATININE 0.75 01/02/2024 0333      Component Value Date/Time   CALCIUM  8.3 (L) 01/02/2024 0333   ALKPHOS 93 01/02/2024 0333   AST 144 (H) 01/02/2024 0333   ALT 148 (H) 01/02/2024 0333   BILITOT 0.8 01/02/2024 0333        Latest Ref Rng & Units 01/02/2024     3:33 AM 01/01/2024    7:52 AM 12/31/2023   10:38 AM  CBC  WBC 4.0 - 10.5 K/uL 7.5  9.0  10.1   Hemoglobin 12.0 - 15.0 g/dL 87.8  87.3  86.2   Hematocrit 36.0 - 46.0 % 36.4  37.7  41.2   Platelets 150 - 400 K/uL 179  158  168    Lab Results  Component Value Date   TSH 4.390 12/31/2023    Results Labs from [facility] 05/23/2023: TC 220, TG 111, HDL 50, LDL 142.  Diagnostic Echocardiogram (12/31/2023): Normal left ventricular systolic function with ejection fraction 65-70%; markedly dilated left atrium; mild mitral valve prolapse with mild regurgitation; aortic valve sclerosis without stenosis.  Procedures: DCCV (11/25/2023)   Risk Assessment/Calculations:    CHA2DS2-VASc Score = 4   This indicates a 4.8% annual risk of stroke. The patient's score is based upon: CHF History: 0 HTN History: 0 Diabetes History: 0 Stroke History: 0 Vascular Disease History: 1 Age Score: 2 Gender Score: 1          Physical Exam:   VS:  BP 118/62 (BP Location: Right Arm, Patient Position: Sitting, Cuff Size: Small)   Pulse 74   Ht 5' 2 (1.575 m)   Wt 100 lb (45.4 kg)   SpO2 95%   BMI 18.29 kg/m    Wt Readings from Last 3 Encounters:  01/25/24 100 lb (45.4 kg)  01/12/24 95 lb (43.1 kg)  12/30/23 95 lb (43.1 kg)     GEN: Thin/frail elderly woman.  Usually very well And well-dressed, but currently appears chronically ill and less well overall.  She has a large scab on the upper part of her left forehead and head with notable alopecia. NECK: No JVD; No carotid bruits CARDIAC: RRR with occasional ectopy.  Normal S1 with split S2.  2/6 SEM at RUSB as well as 2/4 HSM at apex. RESPIRATORY:  Clear to auscultation without rales, wheezing or rhonchi ; nonlabored, good air movement. ABDOMEN: Soft, non-tender, non-distended EXTREMITIES:  No edema; No deformity; mild bruising    ASSESSMENT AND PLAN: .   Hypercoagulable state due to paroxysmal atrial fibrillation [facility]): CHA2DS2-VASc score  6 CHA2DS2-VASc score is 4. Remains on Eliquis  at reduced dose-2.5 mg twice daily for anticoagulation. Thankfully no bleeding issues despite 2 recent falls.  Okay to hold Eliquis  2 to 3 days preop for surgical procedures, but with her tendency to have recurrence of A-fib, would otherwise recommend remaining on it uninterrupted to allow for expedited cardioversion.  Paroxysmal atrial fibrillation (HCC) Symptomatic PAF with most recent cardioversion November 2025. Currently managed with amiodarone  200 mg twice daily and Eliquis  2.5 mg twice  daily for DOAC. Her amiodarone  dose have been increased back up to 200 mg daily with her most recent recurrence of A-fib.  Her beta-blocker were stopped in the hospital for bradycardia.  Thankfully, she remains in sinus rhythm without any recurrence of A-fib during her hospitalization.  I am somewhat concerned about her elevated LFTs in the setting of rhabdo and fall.  I am also concerned about high dose amiodarone  on a 89 year old patient.  Certainly if she would have recurrence of A-fib temporarily going back up to higher dose to allow for cardioversion is warranted. - Reduced amiodarone  dose to 100 mg daily. - Continue Eliquis  2.5 mg twice daily.  On amiodarone  therapy Successfully cardioverted in December following reload of amiodarone  and increased dose to 200 mg daily..  Currently in sinus rhythm.  Amiodarone  dose reduced to 100 mg daily due to age and potential toxicity especially in the elderly..  Liver and thyroid  functions require monitoring, especially in light of recently elevated LFTs.. - Monitor LFTs and TFTs tests biannually. - Would also recommend annual evaluation of CRP and ESR (will hold off on checking that now based on recent falls with injuries) as a measure inflammation which could suggest pulmonary toxicity.  (Each are checked then PFTs in the absence of symptoms)  Moderate to severe mitral regurgitation Most recent echocardiogram  suggested only mild MVP with mild MR as opposed to previously assessed moderate to severe MR in the setting of A-fib.  This would suggest exacerbation of MR by A-fib explaining her symptomatic response. Continue to monitor symptoms, but has previously been determined poor candidate for either surgical or percutaneous intervention.  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  Fatigue due to treatment We have previously reduced her amiodarone  dose from 200 mg to 100 mg daily because of fatigue and exertional dyspnea.  She felt better with reduced dose.    She is now having issues with tremors, poor p.o. intake which may be related to higher dose amiodarone .  I subsequently reduced her amiodarone  back to 100 mg daily  HLD (hyperlipidemia) Managed with atorvastatin  40 mg daily. Lipid levels need assessment. - Ordered lipid panel to assess current cholesterol levels   Orders Placed This Encounter  Procedures   Hepatic function panel   Lipid panel   EKG 12-Lead   EKG 12-Lead           Follow-Up: Return in about 6 months (around 07/24/2024) for Alternate 6 month follow-up with APP & MD.  I spent 52 minutes in the care of Lisa Lang today including reviewing labs (from epic-1 minute), reviewing outside labs from PCP via [facility]  (1 minute), reviewing studies (echocardiogram as well as CT scans from hospitalization reviewed-3 minutes), face to face time discussing treatment options (24 minutes), reviewing records from A-fib clinic visits x 3, hospitalization x 1 and ER visit x 1 (10 minutes), 13 minutes dictating, and documenting in the encounter.   Portions of this note were dictated using DRAGON voice recognition software. Please disregard any errors in transcription. This record has been created using Conservation officer, historic buildings. Errors have been sought and corrected, but may not always be located. Such creation errors do  not reflect on the standard of medical care.      Signed, Alm MICAEL Clay, MD, MS Alm Clay, M.D., M.S. Interventional Cardiologist  [facility] Pager # [phone]      "

## 2024-01-29 ENCOUNTER — Encounter: Payer: Self-pay | Admitting: Cardiology

## 2024-01-29 NOTE — Assessment & Plan Note (Signed)
 We have previously reduced her amiodarone  dose from 200 mg to 100 mg daily because of fatigue and exertional dyspnea.  She felt better with reduced dose.    She is now having issues with tremors, poor p.o. intake which may be related to higher dose amiodarone .  I subsequently reduced her amiodarone  back to 100 mg daily

## 2024-01-29 NOTE — Assessment & Plan Note (Signed)
 Doing well with NYHA class I-II symptoms as far as PND orthopnea.

## 2024-01-29 NOTE — Assessment & Plan Note (Addendum)
 Symptomatic PAF with most recent cardioversion November 2025. Currently managed with amiodarone  200 mg twice daily and Eliquis  2.5 mg twice daily for DOAC. Her amiodarone  dose have been increased back up to 200 mg daily with her most recent recurrence of A-fib.  Her beta-blocker were stopped in the hospital for bradycardia.  Thankfully, she remains in sinus rhythm without any recurrence of A-fib during her hospitalization.  I am somewhat concerned about her elevated LFTs in the setting of rhabdo and fall.  I am also concerned about high dose amiodarone  on a 89 year old patient.  Certainly if she would have recurrence of A-fib temporarily going back up to higher dose to allow for cardioversion is warranted. - Reduced amiodarone  dose to 100 mg daily. - Continue Eliquis  2.5 mg twice daily.

## 2024-01-29 NOTE — Assessment & Plan Note (Signed)
 Successfully cardioverted in December following reload of amiodarone  and increased dose to 200 mg daily..  Currently in sinus rhythm.  Amiodarone  dose reduced to 100 mg daily due to age and potential toxicity especially in the elderly..  Liver and thyroid  functions require monitoring, especially in light of recently elevated LFTs.. - Monitor LFTs and TFTs tests biannually. - Would also recommend annual evaluation of CRP and ESR (will hold off on checking that now based on recent falls with injuries) as a measure inflammation which could suggest pulmonary toxicity.  (Each are checked then PFTs in the absence of symptoms)

## 2024-01-29 NOTE — Assessment & Plan Note (Signed)
 Most recent echocardiogram suggested only mild MVP with mild MR as opposed to previously assessed moderate to severe MR in the setting of A-fib.  This would suggest exacerbation of MR by A-fib explaining her symptomatic response. Continue to monitor symptoms, but has previously been determined poor candidate for either surgical or percutaneous intervention.

## 2024-01-29 NOTE — Assessment & Plan Note (Signed)
 Managed with atorvastatin  40 mg daily. Lipid levels need assessment. - Ordered lipid panel to assess current cholesterol levels

## 2024-01-29 NOTE — Assessment & Plan Note (Signed)
 CHA2DS2-VASc score is 4. Remains on Eliquis  at reduced dose-2.5 mg twice daily for anticoagulation. Thankfully no bleeding issues despite 2 recent falls.  Okay to hold Eliquis  2 to 3 days preop for surgical procedures, but with her tendency to have recurrence of A-fib, would otherwise recommend remaining on it uninterrupted to allow for expedited cardioversion.

## 2024-01-31 ENCOUNTER — Encounter (HOSPITAL_COMMUNITY): Payer: Self-pay

## 2024-01-31 ENCOUNTER — Other Ambulatory Visit: Payer: Self-pay

## 2024-01-31 ENCOUNTER — Inpatient Hospital Stay (HOSPITAL_COMMUNITY)
Admission: EM | Admit: 2024-01-31 | Discharge: 2024-02-05 | DRG: 871 | Disposition: E | Attending: Pulmonary Disease | Admitting: Pulmonary Disease

## 2024-01-31 ENCOUNTER — Emergency Department (HOSPITAL_COMMUNITY)

## 2024-01-31 DIAGNOSIS — Z9842 Cataract extraction status, left eye: Secondary | ICD-10-CM

## 2024-01-31 DIAGNOSIS — Z7901 Long term (current) use of anticoagulants: Secondary | ICD-10-CM

## 2024-01-31 DIAGNOSIS — G9341 Metabolic encephalopathy: Secondary | ICD-10-CM | POA: Diagnosis present

## 2024-01-31 DIAGNOSIS — Z801 Family history of malignant neoplasm of trachea, bronchus and lung: Secondary | ICD-10-CM

## 2024-01-31 DIAGNOSIS — R54 Age-related physical debility: Secondary | ICD-10-CM | POA: Diagnosis present

## 2024-01-31 DIAGNOSIS — Z8249 Family history of ischemic heart disease and other diseases of the circulatory system: Secondary | ICD-10-CM

## 2024-01-31 DIAGNOSIS — I4819 Other persistent atrial fibrillation: Secondary | ICD-10-CM | POA: Diagnosis present

## 2024-01-31 DIAGNOSIS — Z881 Allergy status to other antibiotic agents status: Secondary | ICD-10-CM

## 2024-01-31 DIAGNOSIS — J189 Pneumonia, unspecified organism: Principal | ICD-10-CM | POA: Diagnosis present

## 2024-01-31 DIAGNOSIS — R579 Shock, unspecified: Secondary | ICD-10-CM

## 2024-01-31 DIAGNOSIS — I340 Nonrheumatic mitral (valve) insufficiency: Secondary | ICD-10-CM | POA: Diagnosis present

## 2024-01-31 DIAGNOSIS — Z882 Allergy status to sulfonamides status: Secondary | ICD-10-CM

## 2024-01-31 DIAGNOSIS — R600 Localized edema: Secondary | ICD-10-CM

## 2024-01-31 DIAGNOSIS — Z1152 Encounter for screening for COVID-19: Secondary | ICD-10-CM

## 2024-01-31 DIAGNOSIS — E785 Hyperlipidemia, unspecified: Secondary | ICD-10-CM | POA: Diagnosis present

## 2024-01-31 DIAGNOSIS — Z515 Encounter for palliative care: Secondary | ICD-10-CM

## 2024-01-31 DIAGNOSIS — Z66 Do not resuscitate: Secondary | ICD-10-CM | POA: Diagnosis present

## 2024-01-31 DIAGNOSIS — Z880 Allergy status to penicillin: Secondary | ICD-10-CM

## 2024-01-31 DIAGNOSIS — A419 Sepsis, unspecified organism: Principal | ICD-10-CM | POA: Diagnosis present

## 2024-01-31 DIAGNOSIS — Z8049 Family history of malignant neoplasm of other genital organs: Secondary | ICD-10-CM

## 2024-01-31 DIAGNOSIS — I472 Ventricular tachycardia, unspecified: Secondary | ICD-10-CM | POA: Diagnosis not present

## 2024-01-31 DIAGNOSIS — Z9841 Cataract extraction status, right eye: Secondary | ICD-10-CM

## 2024-01-31 DIAGNOSIS — Z9049 Acquired absence of other specified parts of digestive tract: Secondary | ICD-10-CM

## 2024-01-31 DIAGNOSIS — J9601 Acute respiratory failure with hypoxia: Secondary | ICD-10-CM | POA: Diagnosis present

## 2024-01-31 DIAGNOSIS — E8770 Fluid overload, unspecified: Secondary | ICD-10-CM

## 2024-01-31 DIAGNOSIS — R570 Cardiogenic shock: Secondary | ICD-10-CM | POA: Diagnosis present

## 2024-01-31 DIAGNOSIS — E039 Hypothyroidism, unspecified: Secondary | ICD-10-CM | POA: Diagnosis present

## 2024-01-31 DIAGNOSIS — Z79890 Hormone replacement therapy (postmenopausal): Secondary | ICD-10-CM

## 2024-01-31 DIAGNOSIS — K219 Gastro-esophageal reflux disease without esophagitis: Secondary | ICD-10-CM | POA: Diagnosis present

## 2024-01-31 DIAGNOSIS — Z79899 Other long term (current) drug therapy: Secondary | ICD-10-CM

## 2024-01-31 DIAGNOSIS — R6521 Severe sepsis with septic shock: Secondary | ICD-10-CM | POA: Diagnosis present

## 2024-01-31 DIAGNOSIS — I341 Nonrheumatic mitral (valve) prolapse: Secondary | ICD-10-CM | POA: Diagnosis present

## 2024-01-31 DIAGNOSIS — Z888 Allergy status to other drugs, medicaments and biological substances status: Secondary | ICD-10-CM

## 2024-01-31 DIAGNOSIS — D649 Anemia, unspecified: Secondary | ICD-10-CM | POA: Diagnosis present

## 2024-01-31 DIAGNOSIS — Z87440 Personal history of urinary (tract) infections: Secondary | ICD-10-CM

## 2024-01-31 DIAGNOSIS — N179 Acute kidney failure, unspecified: Secondary | ICD-10-CM | POA: Diagnosis present

## 2024-01-31 DIAGNOSIS — I5033 Acute on chronic diastolic (congestive) heart failure: Secondary | ICD-10-CM | POA: Diagnosis present

## 2024-01-31 DIAGNOSIS — R0902 Hypoxemia: Secondary | ICD-10-CM

## 2024-01-31 LAB — CBC WITH DIFFERENTIAL/PLATELET
Abs Immature Granulocytes: 0.15 10*3/uL — ABNORMAL HIGH (ref 0.00–0.07)
Basophils Absolute: 0.1 10*3/uL (ref 0.0–0.1)
Basophils Relative: 1 %
Eosinophils Absolute: 0.2 10*3/uL (ref 0.0–0.5)
Eosinophils Relative: 1 %
HCT: 32.4 % — ABNORMAL LOW (ref 36.0–46.0)
Hemoglobin: 10.6 g/dL — ABNORMAL LOW (ref 12.0–15.0)
Immature Granulocytes: 1 %
Lymphocytes Relative: 8 %
Lymphs Abs: 1.2 10*3/uL (ref 0.7–4.0)
MCH: 30.5 pg (ref 26.0–34.0)
MCHC: 32.7 g/dL (ref 30.0–36.0)
MCV: 93.1 fL (ref 80.0–100.0)
Monocytes Absolute: 1.4 10*3/uL — ABNORMAL HIGH (ref 0.1–1.0)
Monocytes Relative: 9 %
Neutro Abs: 11.8 10*3/uL — ABNORMAL HIGH (ref 1.7–7.7)
Neutrophils Relative %: 80 %
Platelets: 461 10*3/uL — ABNORMAL HIGH (ref 150–400)
RBC: 3.48 MIL/uL — ABNORMAL LOW (ref 3.87–5.11)
RDW: 16.3 % — ABNORMAL HIGH (ref 11.5–15.5)
WBC: 14.8 10*3/uL — ABNORMAL HIGH (ref 4.0–10.5)
nRBC: 0 % (ref 0.0–0.2)

## 2024-01-31 LAB — TROPONIN T, HIGH SENSITIVITY: Troponin T High Sensitivity: 85 ng/L — ABNORMAL HIGH (ref 0–19)

## 2024-01-31 LAB — RESP PANEL BY RT-PCR (RSV, FLU A&B, COVID)  RVPGX2
Influenza A by PCR: NEGATIVE
Influenza B by PCR: NEGATIVE
Resp Syncytial Virus by PCR: NEGATIVE
SARS Coronavirus 2 by RT PCR: NEGATIVE

## 2024-01-31 LAB — PRO BRAIN NATRIURETIC PEPTIDE: Pro Brain Natriuretic Peptide: 6378 pg/mL — ABNORMAL HIGH

## 2024-01-31 LAB — TSH: TSH: 4.43 u[IU]/mL (ref 0.350–4.500)

## 2024-01-31 LAB — CBG MONITORING, ED: Glucose-Capillary: 102 mg/dL — ABNORMAL HIGH (ref 70–99)

## 2024-01-31 LAB — I-STAT CG4 LACTIC ACID, ED: Lactic Acid, Venous: 1.3 mmol/L (ref 0.5–1.9)

## 2024-01-31 MED ORDER — SODIUM CHLORIDE 0.9 % IV SOLN
1.0000 g | Freq: Once | INTRAVENOUS | Status: AC
  Administered 2024-01-31: 1 g via INTRAVENOUS
  Filled 2024-01-31: qty 10

## 2024-01-31 MED ORDER — AZITHROMYCIN 250 MG PO TABS
500.0000 mg | ORAL_TABLET | Freq: Once | ORAL | Status: AC
  Administered 2024-01-31: 500 mg via ORAL
  Filled 2024-01-31: qty 2

## 2024-01-31 NOTE — ED Triage Notes (Signed)
 PT BIB [facility] from [place] with a c/o lethargy and tachypnea.Capnography of 20, CBG:264. PT given 100 cc of LR.and does have a non-productive cough

## 2024-01-31 NOTE — ED Provider Notes (Signed)
 " [place] EMERGENCY DEPARTMENT AT [facility] Provider Note   CSN: [id number] Arrival date & time: 01/31/24  2035     Patient presents with: No chief complaint on file.   Lisa Lang is a 89 y.o. female.   The history is provided by the patient, the EMS personnel, medical records and a relative. No language interpreter was used.  Illness Location:  Fatigue, confusion, sleepiness, cough Severity:  Moderate Onset quality:  Gradual Duration:  2 days Timing:  Constant Progression:  Waxing and waning Chronicity:  Recurrent Associated symptoms: congestion, cough, fatigue and shortness of breath   Associated symptoms: no abdominal pain, no chest pain, no diarrhea, no fever, no headaches, no loss of consciousness, no nausea, no rash, no vomiting and no wheezing        Prior to Admission medications  Medication Sig Start Date End Date Taking? Authorizing Provider  acetaminophen  (TYLENOL ) 325 MG tablet Take 2 tablets (650 mg total) by mouth every 4 (four) hours as needed. 11/25/21   [name], MD  amiodarone  (PACERONE ) 200 MG tablet Take 0.5 tablets (100 mg total) by mouth daily. 01/25/24   [name] ORN, MD  apixaban  (ELIQUIS ) 2.5 MG TABS tablet TAKE 1 TABLET TWICE A DAY 10/17/23   [name] ORN, MD  atorvastatin  (LIPITOR) 40 MG tablet Take 40 mg by mouth at bedtime. 09/05/17   [provider]  calcium  carbonate (OS-CAL) 600 MG TABS tablet Take 600 mg by mouth daily with breakfast.    [provider]  levothyroxine  (SYNTHROID ) 50 MCG tablet Take 50 mcg by mouth every morning. Patient taking differently: Take 75 mcg by mouth every morning.    [provider]  Multiple Vitamins-Minerals (MULTIVITAMIN PO) Take 1 tablet by mouth daily. Woman 50+    [provider]  pantoprazole  (PROTONIX ) 40 MG tablet Take 40 mg by mouth daily. 01/11/22   [provider]  VITAMIN D , ERGOCALCIFEROL , PO Take 1,000 Units by mouth daily.     [provider]    Allergies: Lidocaine , Amoxicillin, Sulfa antibiotics, and Ciprofloxacin    Review of Systems  Constitutional:  Positive for chills and fatigue. Negative for fever.  HENT:  Positive for congestion.   Respiratory:  Positive for cough and shortness of breath. Negative for chest tightness and wheezing.   Cardiovascular:  Positive for leg swelling. Negative for chest pain and palpitations.  Gastrointestinal:  Negative for abdominal pain, constipation, diarrhea, nausea and vomiting.  Genitourinary:  Negative for dysuria.  Musculoskeletal:  Negative for back pain, neck pain and neck stiffness.  Skin:  Negative for rash and wound.  Neurological:  Negative for loss of consciousness, light-headedness and headaches.  Psychiatric/Behavioral:  Negative for agitation and confusion.   All other systems reviewed and are negative.   Updated Vital Signs BP (!) 107/48   Pulse 70   Temp (!) 97.5 F (36.4 C) (Oral)   Resp 20   Ht 5' 2 (1.575 m)   Wt 45.4 kg   SpO2 100%   BMI 18.31 kg/m   Physical Exam Vitals and nursing note reviewed.  Constitutional:      General: She is not in acute distress.    Appearance: She is well-developed. She is ill-appearing. She is not toxic-appearing or diaphoretic.  HENT:     Head: Normocephalic and atraumatic.     Nose: No congestion or rhinorrhea.     Mouth/Throat:     Mouth: Mucous membranes are moist.  Pharynx: No oropharyngeal exudate or posterior oropharyngeal erythema.  Eyes:     Extraocular Movements: Extraocular movements intact.     Conjunctiva/sclera: Conjunctivae normal.     Pupils: Pupils are equal, round, and reactive to light.  Cardiovascular:     Rate and Rhythm: Normal rate and regular rhythm.     Heart sounds: No murmur heard. Pulmonary:     Effort: Pulmonary effort is normal. No respiratory distress.     Breath sounds: Rhonchi and rales present. No wheezing.  Chest:     Chest wall: No tenderness.   Abdominal:     General: Abdomen is flat.     Palpations: Abdomen is soft.     Tenderness: There is no abdominal tenderness. There is no guarding or rebound.  Musculoskeletal:        General: No swelling or tenderness.     Cervical back: Neck supple.     Right lower leg: Edema present.     Left lower leg: Edema present.  Skin:    General: Skin is warm and dry.     Capillary Refill: Capillary refill takes less than 2 seconds.     Coloration: Skin is not pale.     Findings: No erythema.  Neurological:     General: No focal deficit present.     Mental Status: She is alert.     Sensory: No sensory deficit.     Motor: No weakness.  Psychiatric:        Mood and Affect: Mood normal.     (all labs ordered are listed, but only abnormal results are displayed) Labs Reviewed  PRO BRAIN NATRIURETIC PEPTIDE - Abnormal; Notable for the following components:      Result Value   Pro Brain Natriuretic Peptide 6,378.0 (*)    All other components within normal limits  CBC WITH DIFFERENTIAL/PLATELET - Abnormal; Notable for the following components:   WBC 14.8 (*)    RBC 3.48 (*)    Hemoglobin 10.6 (*)    HCT 32.4 (*)    RDW 16.3 (*)    Platelets 461 (*)    Neutro Abs 11.8 (*)    Monocytes Absolute 1.4 (*)    Abs Immature Granulocytes 0.15 (*)    All other components within normal limits  CBG MONITORING, ED - Abnormal; Notable for the following components:   Glucose-Capillary 102 (*)    All other components within normal limits  TROPONIN T, HIGH SENSITIVITY - Abnormal; Notable for the following components:   Troponin T High Sensitivity 85 (*)    All other components within normal limits  RESP PANEL BY RT-PCR (RSV, FLU A&B, COVID)  RVPGX2  CULTURE, BLOOD (ROUTINE X 2)  CULTURE, BLOOD (ROUTINE X 2)  TSH  CBC WITH DIFFERENTIAL/PLATELET  URINALYSIS, W/ REFLEX TO CULTURE (INFECTION SUSPECTED)  COMPREHENSIVE METABOLIC PANEL WITH GFR  LIPASE, BLOOD  I-STAT CG4 LACTIC ACID, ED  I-STAT CG4  LACTIC ACID, ED  TROPONIN T, HIGH SENSITIVITY    EKG: None  Radiology: DG Chest Portable 1 View Result Date: 01/31/2024 CLINICAL DATA:  History of pneumonia EXAM: PORTABLE CHEST 1 VIEW COMPARISON:  12/30/2023, 10/04/2023 FINDINGS: Cardiomegaly with central congestion and mild diffuse interstitial opacity suggestive of edema. Possible trace pleural effusions. Patchy atelectasis or minimal infiltrate left lung base. Aortic atherosclerosis. IMPRESSION: Cardiomegaly with central congestion and mild diffuse interstitial opacity suggestive of edema. Possible trace pleural effusions. Patchy atelectasis or minimal infiltrate at the left base. Electronically Signed   By: Luke Bun  M.D.   On: 01/31/2024 21:35     Procedures     Medications Ordered in the ED  cefTRIAXone  (ROCEPHIN ) 1 g in sodium chloride  0.9 % 100 mL IVPB (1 g Intravenous New Bag/Given 01/31/24 2251)  azithromycin  (ZITHROMAX ) tablet 500 mg (500 mg Oral Given 01/31/24 2252)                                    Medical Decision Making Amount and/or Complexity of Data Reviewed Labs: ordered. Radiology: ordered.  Risk Prescription drug management.    KORTNEE BAS is a 89 y.o. female with a past medical history significant for hyperlipidemia, paroxysmal atrial fibrillation on Eliquis  therapy, previous cholecystectomy, mitral valve prolapse, previous diverticulitis, previous urinary tract infections, and heart failure who presents with fatigue, tachypnea, cough, and malaise.  According to patient, she feels drained and tired and has been coughing up phlegm.  She just reports feeling fatigued.  She is denying any chest pain but does agree to some shortness of breath.  Her oxygen saturations were reportedly in the 80s on room air and she does not take oxygen at home and she is now on several liters nasal cannula.  She is denying any urinary changes but family with her says that she has had smoldering urinary tract infections that  have caused her to be confused in the past.  She has felt more fatigued and tired but seems to be feeling better on some oxygen now.  She has no reported nausea, vomiting, constipation, or diarrhea.  Denies any trauma or falls.  Is denying any headache or neck pain at this time.  No stiffness in her neck.  On my exam, she does have some rales and rhonchi.  I did not appreciate wheezing.  Chest and abdomen nontender.  Moving all extremities.  Patient initially somewhat sleepy but then woke up better after more time here.  Patient does have more edema in her legs and she reports she normally has.  Workup began to return.  Her proBNP is elevated at 6378 which may contribute some of the edema she was describing in her legs and wheeze on exam.  Her troponin is 85 which is actually improved from over 200 a month ago.  She is negative for COVID/flu/RSV but her x-ray does show concern for pneumonia and her white count is elevated at 14.8.  Chest x-ray also shows fluid overload.  Suspect the hypoxia is due to combination of pneumonia and fluid overload.  She is still waiting on the metabolic panel to return but will plan for admission so she can stay on the oxygen and get IV antibiotics.  Will admit after workup is completed  After her metabolic panel has returned, will admit.     Final diagnoses:  Pneumonia due to infectious organism, unspecified laterality, unspecified part of lung  Peripheral edema  Hypoxia  Hypervolemia, unspecified hypervolemia type     Clinical Impression: 1. Pneumonia due to infectious organism, unspecified laterality, unspecified part of lung   2. Peripheral edema   3. Hypoxia   4. Hypervolemia, unspecified hypervolemia type     Disposition: Admit  This note was prepared with assistance of Dragon voice recognition software. Occasional wrong-word or sound-a-like substitutions may have occurred due to the inherent limitations of voice recognition software.       Melane Windholz, Lonni PARAS, MD 01/31/24 2332  "

## 2024-01-31 NOTE — ED Notes (Signed)
 PT's neighbor is bedside

## 2024-01-31 NOTE — ED Notes (Signed)
 Called and placed PT on monitor with CCMD

## 2024-02-01 DIAGNOSIS — J189 Pneumonia, unspecified organism: Secondary | ICD-10-CM | POA: Diagnosis present

## 2024-02-01 LAB — COMPREHENSIVE METABOLIC PANEL WITH GFR
ALT: 50 U/L — ABNORMAL HIGH (ref 0–44)
ALT: 52 U/L — ABNORMAL HIGH (ref 0–44)
AST: 47 U/L — ABNORMAL HIGH (ref 15–41)
AST: 52 U/L — ABNORMAL HIGH (ref 15–41)
Albumin: 2.7 g/dL — ABNORMAL LOW (ref 3.5–5.0)
Albumin: 2.8 g/dL — ABNORMAL LOW (ref 3.5–5.0)
Alkaline Phosphatase: 101 U/L (ref 38–126)
Alkaline Phosphatase: 97 U/L (ref 38–126)
Anion gap: 7 (ref 5–15)
Anion gap: 7 (ref 5–15)
BUN: 12 mg/dL (ref 8–23)
BUN: 13 mg/dL (ref 8–23)
CO2: 27 mmol/L (ref 22–32)
CO2: 27 mmol/L (ref 22–32)
Calcium: 8.4 mg/dL — ABNORMAL LOW (ref 8.9–10.3)
Calcium: 8.5 mg/dL — ABNORMAL LOW (ref 8.9–10.3)
Chloride: 103 mmol/L (ref 98–111)
Chloride: 105 mmol/L (ref 98–111)
Creatinine, Ser: 1 mg/dL (ref 0.44–1.00)
Creatinine, Ser: 1.02 mg/dL — ABNORMAL HIGH (ref 0.44–1.00)
GFR, Estimated: 51 mL/min — ABNORMAL LOW
GFR, Estimated: 52 mL/min — ABNORMAL LOW
Glucose, Bld: 101 mg/dL — ABNORMAL HIGH (ref 70–99)
Glucose, Bld: 95 mg/dL (ref 70–99)
Potassium: 4.1 mmol/L (ref 3.5–5.1)
Potassium: 4.5 mmol/L (ref 3.5–5.1)
Sodium: 137 mmol/L (ref 135–145)
Sodium: 139 mmol/L (ref 135–145)
Total Bilirubin: 0.8 mg/dL (ref 0.0–1.2)
Total Bilirubin: 0.8 mg/dL (ref 0.0–1.2)
Total Protein: 5 g/dL — ABNORMAL LOW (ref 6.5–8.1)
Total Protein: 5.2 g/dL — ABNORMAL LOW (ref 6.5–8.1)

## 2024-02-01 LAB — URINALYSIS, W/ REFLEX TO CULTURE (INFECTION SUSPECTED)
Bacteria, UA: NONE SEEN
Bilirubin Urine: NEGATIVE
Glucose, UA: NEGATIVE mg/dL
Hgb urine dipstick: NEGATIVE
Ketones, ur: NEGATIVE mg/dL
Nitrite: NEGATIVE
Protein, ur: 30 mg/dL — AB
Specific Gravity, Urine: 1.013 (ref 1.005–1.030)
pH: 6 (ref 5.0–8.0)

## 2024-02-01 LAB — CBC WITH DIFFERENTIAL/PLATELET
Abs Immature Granulocytes: 0.11 10*3/uL — ABNORMAL HIGH (ref 0.00–0.07)
Basophils Absolute: 0 10*3/uL (ref 0.0–0.1)
Basophils Relative: 0 %
Eosinophils Absolute: 0.3 10*3/uL (ref 0.0–0.5)
Eosinophils Relative: 2 %
HCT: 29.5 % — ABNORMAL LOW (ref 36.0–46.0)
Hemoglobin: 9.7 g/dL — ABNORMAL LOW (ref 12.0–15.0)
Immature Granulocytes: 1 %
Lymphocytes Relative: 4 %
Lymphs Abs: 0.5 10*3/uL — ABNORMAL LOW (ref 0.7–4.0)
MCH: 30.6 pg (ref 26.0–34.0)
MCHC: 32.9 g/dL (ref 30.0–36.0)
MCV: 93.1 fL (ref 80.0–100.0)
Monocytes Absolute: 1.2 10*3/uL — ABNORMAL HIGH (ref 0.1–1.0)
Monocytes Relative: 9 %
Neutro Abs: 10.8 10*3/uL — ABNORMAL HIGH (ref 1.7–7.7)
Neutrophils Relative %: 84 %
Platelets: 395 10*3/uL (ref 150–400)
RBC: 3.17 MIL/uL — ABNORMAL LOW (ref 3.87–5.11)
RDW: 16.2 % — ABNORMAL HIGH (ref 11.5–15.5)
WBC: 12.9 10*3/uL — ABNORMAL HIGH (ref 4.0–10.5)
nRBC: 0 % (ref 0.0–0.2)

## 2024-02-01 LAB — MAGNESIUM: Magnesium: 2.2 mg/dL (ref 1.7–2.4)

## 2024-02-01 LAB — I-STAT CG4 LACTIC ACID, ED: Lactic Acid, Venous: 1 mmol/L (ref 0.5–1.9)

## 2024-02-01 LAB — PRO BRAIN NATRIURETIC PEPTIDE: Pro Brain Natriuretic Peptide: 3959 pg/mL — ABNORMAL HIGH

## 2024-02-01 LAB — LIPASE, BLOOD: Lipase: 18 U/L (ref 11–51)

## 2024-02-01 LAB — PROCALCITONIN: Procalcitonin: 0.15 ng/mL

## 2024-02-01 LAB — TROPONIN T, HIGH SENSITIVITY: Troponin T High Sensitivity: 83 ng/L — ABNORMAL HIGH (ref 0–19)

## 2024-02-01 MED ORDER — DOXYCYCLINE HYCLATE 100 MG PO TABS
100.0000 mg | ORAL_TABLET | Freq: Two times a day (BID) | ORAL | Status: DC
  Filled 2024-02-01: qty 1

## 2024-02-01 MED ORDER — ONDANSETRON HCL 4 MG/2ML IJ SOLN
4.0000 mg | Freq: Four times a day (QID) | INTRAMUSCULAR | Status: DC | PRN
  Administered 2024-02-01: 4 mg via INTRAVENOUS
  Filled 2024-02-01: qty 2

## 2024-02-01 MED ORDER — APIXABAN 2.5 MG PO TABS
2.5000 mg | ORAL_TABLET | Freq: Two times a day (BID) | ORAL | Status: DC
  Filled 2024-02-01 (×2): qty 1

## 2024-02-01 MED ORDER — FUROSEMIDE 10 MG/ML IJ SOLN: 20.0000 mg | Freq: Two times a day (BID) | INTRAMUSCULAR | Status: DC

## 2024-02-01 MED ORDER — AMIODARONE HCL 200 MG PO TABS
100.0000 mg | ORAL_TABLET | Freq: Every day | ORAL | Status: DC
  Administered 2024-02-02 – 2024-02-03 (×2): 100 mg via ORAL
  Filled 2024-02-01 (×3): qty 1

## 2024-02-01 MED ORDER — ALBUMIN HUMAN 25 % IV SOLN
25.0000 g | INTRAVENOUS | Status: AC
  Administered 2024-02-01: 25 g via INTRAVENOUS
  Filled 2024-02-01: qty 100

## 2024-02-01 MED ORDER — AZITHROMYCIN 250 MG PO TABS: 500.0000 mg | ORAL_TABLET | Freq: Every day | ORAL | Status: DC

## 2024-02-01 MED ORDER — LEVOTHYROXINE SODIUM 25 MCG PO TABS
75.0000 ug | ORAL_TABLET | Freq: Every day | ORAL | Status: DC
  Filled 2024-02-01: qty 3

## 2024-02-01 MED ORDER — MELATONIN 3 MG PO TABS: 3.0000 mg | ORAL_TABLET | Freq: Every evening | ORAL | Status: DC | PRN

## 2024-02-01 MED ORDER — ENOXAPARIN SODIUM 40 MG/0.4ML IJ SOSY: 40.0000 mg | PREFILLED_SYRINGE | INTRAMUSCULAR | Status: DC

## 2024-02-01 MED ORDER — SODIUM CHLORIDE 0.9 % IV BOLUS
1000.0000 mL | INTRAVENOUS | Status: AC
  Administered 2024-02-01: 1000 mL via INTRAVENOUS

## 2024-02-01 MED ORDER — ATORVASTATIN CALCIUM 40 MG PO TABS
40.0000 mg | ORAL_TABLET | Freq: Every day | ORAL | Status: DC
  Administered 2024-02-02 – 2024-02-03 (×2): 40 mg via ORAL
  Filled 2024-02-01 (×3): qty 1

## 2024-02-01 MED ORDER — BENZONATATE 100 MG PO CAPS
200.0000 mg | ORAL_CAPSULE | Freq: Three times a day (TID) | ORAL | Status: DC | PRN
  Administered 2024-02-02 – 2024-02-03 (×4): 200 mg via ORAL
  Filled 2024-02-01 (×4): qty 2

## 2024-02-01 MED ORDER — ACETAMINOPHEN 325 MG PO TABS
650.0000 mg | ORAL_TABLET | Freq: Four times a day (QID) | ORAL | Status: DC | PRN
  Administered 2024-02-02 – 2024-02-03 (×2): 650 mg via ORAL
  Filled 2024-02-01 (×2): qty 2

## 2024-02-01 MED ORDER — PANTOPRAZOLE SODIUM 40 MG PO TBEC
40.0000 mg | DELAYED_RELEASE_TABLET | Freq: Every morning | ORAL | Status: DC
  Administered 2024-02-02 – 2024-02-03 (×2): 40 mg via ORAL
  Filled 2024-02-01 (×3): qty 1

## 2024-02-01 MED ORDER — SODIUM CHLORIDE 0.9 % IV SOLN
1.0000 g | Freq: Every day | INTRAVENOUS | Status: DC
  Administered 2024-02-01: 1 g via INTRAVENOUS
  Filled 2024-02-01: qty 10

## 2024-02-01 MED ORDER — SODIUM CHLORIDE 0.9 % IV SOLN: 1.0000 g | INTRAVENOUS | Status: DC

## 2024-02-01 MED ORDER — SODIUM CHLORIDE 0.9 % IV SOLN: INTRAVENOUS | Status: DC

## 2024-02-01 MED ORDER — ACETAMINOPHEN 650 MG RE SUPP: 650.0000 mg | Freq: Four times a day (QID) | RECTAL | Status: DC | PRN

## 2024-02-01 NOTE — Plan of Care (Addendum)
 Patient's blood pressure is soft MAP 52.  Giving 1 L of NS bolus and albumin  25 g.    Patient being admitted for sepsis in the setting of pneumonia and concern for CHF exacerbation at the same time.  Initially treated with IV Lasix  however in the setting of sepsis further IV fluid bolus has been stopped and currently on maintenance fluid NS 75 cc/h.   Update, after receiving 500 mL of bolus blood pressure improved 120/102 Holding further IV fluid NS and albumin .  Lisa Earnhardt, MD [facility] 02/01/2024, 11:48 PM

## 2024-02-01 NOTE — ED Notes (Addendum)
 Strait cath urine obtained Kayla RN assist Clean brief applied patient repositioned. Patient GCS assessed due to concern for a change in mentation patient is oriented to person, place, time and situation.

## 2024-02-01 NOTE — H&P (Addendum)
 " History and Physical    Patient: Lisa Lang FMW:[id number] DOB: Jan 08, 1927 DOA: 01/31/2024 DOS: the patient was seen and examined on 02/01/2024 PCP: Alys Schuyler HERO, PA  Patient coming from: Home  Chief Complaint:  Chief Complaint  Patient presents with   Code Sepsis   HPI: Lisa Lang is a 89 y.o. female with medical history significant of persistent AF on Eliquis  recent DCCV on 11/25/2023, aortic atherosclerosis MVP with moderate to severe MR, arthritis, hyperlipidemia, hypothyroidism, and last admitted from  12/26-12/31 for GLF iso UTI/influenza who p/w acute encephalopathy 2/2 CAP vs HFpEF exacerbation.  Pt is a poor historian and unable to provide any reliable medical history at this time given intermittent confusion. Per her close friend/neighbor, [name], he found on the floor of her house on December 26th after a GLF, when she was admitted with UTI and flu, she presents from her rehab facility (where she has resided since leaving the hospital on 12/31) with concern for confusion after being found slumped over in her chair and noted to have bumped her head with a bloody contusion on her dorsal surface. Per [name], the patient was very independent and lived alone before her fall, but has been unable as of late to get back to her baseline.  In the ED, pt febrile (100.6 deg F x1), tachypnea w/ hypoxia. Labs notable for pro-BNP [id number], troponin 85>83, and WBC 12.9. CXR showed Cardiomegaly with central congestion and mild diffuse interstitial opacity suggestive of edema. EDP started CAP abx (IV CTX/azithromycin ) and requested admission.   Review of Systems: As mentioned in the history of present illness. All other systems reviewed and are negative. Past Medical History:  Diagnosis Date   Arthritis    Diverticulitis 1995   And 2007   Dysrhythmia    Hyperlipidemia LDL goal <100    Mitral valve prolapse    Bileaflet MVP with moderate MR.   PAF (paroxysmal atrial fibrillation)  (HCC) 11/2017   CHA2DS2 VASc score > 3 (Age >55 and female sex), --> Eliquis  & Metoprolol     Past Surgical History:  Procedure Laterality Date   CARDIOVERSION N/A 10/28/2021   Procedure: CARDIOVERSION;  Surgeon: Hobart Powell BRAVO, MD;  Location: [facility] ENDOSCOPY;  Service: Cardiovascular;  Laterality: N/A;   CARDIOVERSION N/A 11/06/2021   Procedure: CARDIOVERSION;  Surgeon: Santo Stanly LABOR, MD;  Location: [facility] ENDOSCOPY;  Service: Cardiovascular;  Laterality: N/A;   CARDIOVERSION N/A 11/25/2023   Procedure: CARDIOVERSION;  Surgeon: Michele Richardson, DO;  Location: [facility] INVASIVE CV LAB;  Service: Cardiovascular;  Laterality: N/A;   CATARACT EXTRACTION, BILATERAL     CHOLECYSTECTOMY N/A 11/24/2021   Procedure: LAPAROSCOPIC CHOLECYSTECTOMY;  Surgeon: Eletha Boas, MD;  Location: [facility] ORS;  Service: General;  Laterality: N/A;   ERCP N/A 11/23/2021   Procedure: ENDOSCOPIC RETROGRADE CHOLANGIOPANCREATOGRAPHY (ERCP);  Surgeon: Saintclair Jasper, MD;  Location: [facility] ENDOSCOPY;  Service: Gastroenterology;  Laterality: N/A;   REMOVAL OF STONES  11/23/2021   Procedure: REMOVAL OF STONES;  Surgeon: Saintclair Jasper, MD;  Location: [facility] ENDOSCOPY;  Service: Gastroenterology;;   [name]  11/23/2021   Procedure: [name];  Surgeon: Saintclair Jasper, MD;  Location: [facility] ENDOSCOPY;  Service: Gastroenterology;;   TEE WITHOUT CARDIOVERSION N/A 10/28/2021   Procedure: TRANSESOPHAGEAL ECHOCARDIOGRAM (TEE) - SYNCHRONIZED DIRECT CURRENT CARDIOVERSION (DCCV);  Surgeon: Hobart Powell BRAVO, MD;  Location: [facility] ENDOSCOPY;  Service: CV;; LVEF 65-70%. No RWMA . Mod LA dilation w/o thrombus. Normal RV, Mod RA dilation. 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 Bileaflet  Prolapse w/ Large Post & small Ant Jet.= 3+/Mod MR - no PVV reversal. = DCCV   TRANSTHORACIC ECHOCARDIOGRAM  10/25/2021   Notably he was very elderly woman.  No acute distress.  Just seems worn out.   TRANSTHORACIC ECHOCARDIOGRAM  12/12/2019    EF 60 to  65%.  No R WMA.  Unable to assess diastolic pressures.  Normal RV pressures.  Mild biatrial enlargement.  Moderate aortic calcification - no AS.  Severe BI-Leaflet MVP with Mod-Severe MR   Social History:  reports that she has never smoked. She has never used smokeless tobacco. She reports that she does not drink alcohol  and does not use drugs.  Allergies[1]  Family History  Problem Relation Age of Onset   Cervical cancer Mother    CAD Father    Lung cancer Sister    Stroke Neg Hx    Diabetes Neg Hx     Prior to Admission medications  Medication Sig Start Date End Date Taking? Authorizing Provider  acetaminophen  (TYLENOL ) 325 MG tablet Take 2 tablets (650 mg total) by mouth every 4 (four) hours as needed. 11/25/21  Yes [name], MD  amiodarone  (PACERONE ) 100 MG tablet Take 100 mg by mouth daily. 01/25/24  Yes [provider]  apixaban  (ELIQUIS ) 2.5 MG TABS tablet TAKE 1 TABLET TWICE A DAY 10/17/23  Yes [name] ORN, MD  atorvastatin  (LIPITOR) 40 MG tablet Take 40 mg by mouth at bedtime. 09/05/17  Yes [provider]  calcium  carbonate (TUMS - DOSED IN MG ELEMENTAL CALCIUM ) 500 MG chewable tablet Chew 1 tablet by mouth in the morning.   Yes [provider]  Cholecalciferol  25 MCG (1000 UT) capsule Take 1,000 Units by mouth in the morning.   Yes [provider]  levothyroxine  (SYNTHROID ) 75 MCG tablet Take 75 mcg by mouth daily before breakfast.   Yes [provider]  Multiple Vitamins-Minerals (MULTIVITAMIN WOMEN 50+) TABS Take 1 tablet by mouth in the morning.   Yes [provider]  pantoprazole  (PROTONIX ) 40 MG tablet Take 40 mg by mouth in the morning. 01/11/22  Yes [provider]    Physical Exam: Vitals:   02/01/24 0534 02/01/24 0606 02/01/24 0830 02/01/24 1013  BP:  (!) 130/56 117/66   Pulse: 66 72 70   Resp: 19 (!) 23 (!) 21   Temp: 99.5 F (37.5 C) 99.2 F (37.3 C)  (!) 100.6 F (38.1 C)  TempSrc: Oral  Rectal  Axillary  SpO2: 98% 100% 100%   Weight:      Height:       General: Alert/awakens to voice, oriented x1 (name only), resting comfortably in no acute distress Respiratory: Diffuse rhonchi; no wheezing Cardiovascular: Regular rate and rhythm w/o m/r/g   Data Reviewed:  Lab Results  Component Value Date   WBC 12.9 (H) 02/01/2024   HGB 9.7 (L) 02/01/2024   HCT 29.5 (L) 02/01/2024   MCV 93.1 02/01/2024   PLT 395 02/01/2024   Lab Results  Component Value Date   GLUCOSE 95 02/01/2024   CALCIUM  8.5 (L) 02/01/2024   NA 139 02/01/2024   K 4.1 02/01/2024   CO2 27 02/01/2024   CL 105 02/01/2024   BUN 12 02/01/2024   CREATININE 1.00 02/01/2024   Lab Results  Component Value Date   ALT 50 (H) 02/01/2024   AST 47 (H) 02/01/2024   ALKPHOS 97 02/01/2024   BILITOT 0.8 02/01/2024   Lab Results  Component Value Date   INR  1.3 (H) 10/04/2023   INR 1.6 (H) 11/21/2021   INR 1.4 (H) 10/24/2021   Radiology: DG Chest Portable 1 View Result Date: 01/31/2024 CLINICAL DATA:  History of pneumonia EXAM: PORTABLE CHEST 1 VIEW COMPARISON:  12/30/2023, 10/04/2023 FINDINGS: Cardiomegaly with central congestion and mild diffuse interstitial opacity suggestive of edema. Possible trace pleural effusions. Patchy atelectasis or minimal infiltrate left lung base. Aortic atherosclerosis. IMPRESSION: Cardiomegaly with central congestion and mild diffuse interstitial opacity suggestive of edema. Possible trace pleural effusions. Patchy atelectasis or minimal infiltrate at the left base. Electronically Signed   By: Luke Bun M.D.   On: 01/31/2024 21:35    Assessment and Plan: 35F h/o persistent AF on Eliquis  recent DCCV on 11/25/2023, aortic atherosclerosis MVP with moderate to severe MR, arthritis, hyperlipidemia, hypothyroidism, and last admitted from  12/26-12/31 for GLF iso UTI/influenza who p/w acute encephalopathy iso sepsis 2/2 CAP c/b HFpEF exacerbation.  AHRF -See below  Sepsis 2/2  CAP Possible post-influenza PNA -MIVF: NS at 75cc/h for now -IV CTX 1g daily to complete 5 day CAP course -PO doxycycline  100mg  daily to complete 5 day CAP course -Duonebs prn -Wean O2 as tolerated -Ambulatory pulse ox prior to d/c -F/u blood cultures  HFpEF exacerbation -Defer diuresis for now given hypotension/sepsis 2/2 CAP; will need IV diuresis prior to d/c -PTA amiodarone  100mg  daily and atorvastatin  40mg  daily  Afib -PTA amiodarone  and Eliquis  2.5mg  BID  HLD -PTA atorvastatin   Hypothyroidism -PTA Synthroid   GERD -PTA PPI   Advance Care Planning:   Code Status: Full Code   Consults: N/A  Family Communication: Son, [name],  and neighbor, Pete  Severity of Illness: The appropriate patient status for this patient is INPATIENT. Inpatient status is judged to be reasonable and necessary in order to provide the required intensity of service to ensure the patient's safety. The patient's presenting symptoms, physical exam findings, and initial radiographic and laboratory data in the context of their chronic comorbidities is felt to place them at high risk for further clinical deterioration. Furthermore, it is not anticipated that the patient will be medically stable for discharge from the hospital within 2 midnights of admission.   * I certify that at the point of admission it is my clinical judgment that the patient will require inpatient hospital care spanning beyond 2 midnights from the point of admission due to high intensity of service, high risk for further deterioration and high frequency of surveillance required.*   ------- I spent 65 minutes reviewing previous notes, at the bedside counseling/discussing the treatment plan, and performing clinical documentation.  Author: Marsha Ada, MD 02/01/2024 11:23 AM  For on call review [email or web address].      [1]  Allergies Allergen Reactions   Lidocaine  Palpitations and Other (See Comments)    True tachycardia, dizziness,  vertigo Shakes, Causes her to cry   Amoxicillin Hives and Itching   Sulfa Antibiotics Hives and Itching   Ciprofloxacin Diarrhea   "

## 2024-02-01 NOTE — ED Provider Notes (Signed)
" °  Provider Note MRN:  [id number]  Arrival date & time: 02/01/24    ED Course and Medical Decision Making  Assumed care of patient at sign-out or upon transfer.  Plan is for admission for pneumonia, mild hypoxia, hypervolemia as well.  1:30 AM update: Patient sitting comfortably on my assessment, still 3 L nasal cannula.  Awaiting hospitalist admission acceptance.  .Critical Care  Performed by: Theadore Ozell HERO, MD Authorized by: Theadore Ozell HERO, MD   Critical care provider statement:    Critical care time (minutes):  32   Critical care was necessary to treat or prevent imminent or life-threatening deterioration of the following conditions:  Respiratory failure   Critical care was time spent personally by me on the following activities:  Development of treatment plan with patient or surrogate, discussions with consultants, evaluation of patient's response to treatment, examination of patient, ordering and review of laboratory studies, ordering and review of radiographic studies, ordering and performing treatments and interventions, pulse oximetry, re-evaluation of patient's condition and review of old charts   I assumed direction of critical care for this patient from another provider in my specialty: yes     Care discussed with: admitting provider     Final Clinical Impressions(s) / ED Diagnoses     ICD-10-CM   1. Pneumonia due to infectious organism, unspecified laterality, unspecified part of lung  J18.9     2. Peripheral edema  R60.0     3. Hypoxia  R09.02     4. Hypervolemia, unspecified hypervolemia type  E87.70       ED Discharge Orders     None       Discharge Instructions   None     Ozell HERO. Theadore, MD [facility] [email or web address]    Theadore Ozell HERO, MD 02/01/24 0145  "

## 2024-02-01 NOTE — ED Notes (Signed)
 Patient rolled cleaned brief and linens changed, Warm blankets provided. Patient has Mepilx on sacrum dated 01/30/23 clean and intact.

## 2024-02-01 NOTE — ED Notes (Signed)
 Patient had a rhythm on the bedside monitor that appeared to be vifib patient has a pulse CCMD contacted for confirmation of artifact. Kaya at [facility] reports NSR in V2.

## 2024-02-01 NOTE — Progress Notes (Signed)
" °  Carryover admission to the Day Admitter.  I discussed this case with the EDP, Dr. Theadore.  Per these discussions:   This is a 89 year old female without any known baseline supplemental oxygen requirements, who is being admitted for acute hypoxic respiratory failure in the setting of community-acquired pneumonia, after presenting with shortness of breath, cough, subjective fever, with presenting chest x-ray reported to show evidence suggestive of pneumonia.  Initial oxygen saturations were in the ED in the 80s on room air, [place] improving into the mid 90s on 3 L nasal cannula.  Blood cultures x 2 were collected in the ED followed by initiation of azithromycin  and Rocephin  for community-acquired pneumonia coverage.  Urinalysis ordered, with result currently pending.  I have placed an order for inpatient admission to pcu for further evaluation and management of the above.  I have placed some additional preliminary admit orders via the adult multi-morbid admission order set. I have also ordered continuation of the azithromycin /Rocephin  started in the ED this evening, procalcitonin level, prn Tessalon  Perles, proBNP for the morning, along with additional morning labs that include CMP, CBC, and magnesium level.    Eva Pore, DO Hospitalist  "

## 2024-02-01 NOTE — ED Notes (Signed)
 Patient confused. Unable to follow commands. Patient not willing to drink. Unable to give oral pills at this time due to safety/aspiration risk. Dr. Georgina made aware

## 2024-02-01 NOTE — ED Notes (Signed)
 Assessed patient to obtain cath urine sample patient had a full brief of urine will wait for patient to collect urine in bladder.

## 2024-02-02 ENCOUNTER — Inpatient Hospital Stay (HOSPITAL_COMMUNITY)

## 2024-02-02 DIAGNOSIS — E785 Hyperlipidemia, unspecified: Secondary | ICD-10-CM | POA: Diagnosis not present

## 2024-02-02 DIAGNOSIS — I5030 Unspecified diastolic (congestive) heart failure: Secondary | ICD-10-CM | POA: Diagnosis not present

## 2024-02-02 DIAGNOSIS — R579 Shock, unspecified: Secondary | ICD-10-CM

## 2024-02-02 DIAGNOSIS — K219 Gastro-esophageal reflux disease without esophagitis: Secondary | ICD-10-CM

## 2024-02-02 DIAGNOSIS — J9601 Acute respiratory failure with hypoxia: Secondary | ICD-10-CM

## 2024-02-02 DIAGNOSIS — G9340 Encephalopathy, unspecified: Secondary | ICD-10-CM

## 2024-02-02 DIAGNOSIS — A419 Sepsis, unspecified organism: Secondary | ICD-10-CM | POA: Insufficient documentation

## 2024-02-02 DIAGNOSIS — R578 Other shock: Secondary | ICD-10-CM

## 2024-02-02 DIAGNOSIS — D649 Anemia, unspecified: Secondary | ICD-10-CM | POA: Diagnosis not present

## 2024-02-02 DIAGNOSIS — R0902 Hypoxemia: Secondary | ICD-10-CM

## 2024-02-02 DIAGNOSIS — E039 Hypothyroidism, unspecified: Secondary | ICD-10-CM | POA: Diagnosis not present

## 2024-02-02 DIAGNOSIS — J189 Pneumonia, unspecified organism: Secondary | ICD-10-CM

## 2024-02-02 DIAGNOSIS — I340 Nonrheumatic mitral (valve) insufficiency: Secondary | ICD-10-CM | POA: Diagnosis not present

## 2024-02-02 DIAGNOSIS — I4891 Unspecified atrial fibrillation: Secondary | ICD-10-CM | POA: Diagnosis not present

## 2024-02-02 DIAGNOSIS — N179 Acute kidney failure, unspecified: Secondary | ICD-10-CM | POA: Diagnosis not present

## 2024-02-02 LAB — CORTISOL: Cortisol, Plasma: 50.3 ug/dL

## 2024-02-02 LAB — TSH: TSH: 2 u[IU]/mL (ref 0.350–4.500)

## 2024-02-02 LAB — CBC
HCT: 31 % — ABNORMAL LOW (ref 36.0–46.0)
HCT: 30.3 % — ABNORMAL LOW (ref 36.0–46.0)
Hemoglobin: 9.8 g/dL — ABNORMAL LOW (ref 12.0–15.0)
Hemoglobin: 9.8 g/dL — ABNORMAL LOW (ref 12.0–15.0)
MCH: 30.4 pg (ref 26.0–34.0)
MCH: 31 pg (ref 26.0–34.0)
MCHC: 31.6 g/dL (ref 30.0–36.0)
MCHC: 32.3 g/dL (ref 30.0–36.0)
MCV: 96.3 fL (ref 80.0–100.0)
MCV: 95.9 fL (ref 80.0–100.0)
Platelets: 406 10*3/uL — ABNORMAL HIGH (ref 150–400)
Platelets: 409 10*3/uL — ABNORMAL HIGH (ref 150–400)
RBC: 3.22 MIL/uL — ABNORMAL LOW (ref 3.87–5.11)
RBC: 3.16 MIL/uL — ABNORMAL LOW (ref 3.87–5.11)
RDW: 16.6 % — ABNORMAL HIGH (ref 11.5–15.5)
RDW: 16.7 % — ABNORMAL HIGH (ref 11.5–15.5)
WBC: 42.9 10*3/uL — ABNORMAL HIGH (ref 4.0–10.5)
WBC: 41.8 10*3/uL — ABNORMAL HIGH (ref 4.0–10.5)
nRBC: 0 % (ref 0.0–0.2)
nRBC: 0 % (ref 0.0–0.2)

## 2024-02-02 LAB — URINALYSIS, ROUTINE W REFLEX MICROSCOPIC
Bilirubin Urine: NEGATIVE
Glucose, UA: NEGATIVE mg/dL
Hgb urine dipstick: NEGATIVE
Ketones, ur: NEGATIVE mg/dL
Leukocytes,Ua: NEGATIVE
Nitrite: NEGATIVE
Protein, ur: 100 mg/dL — AB
Specific Gravity, Urine: 1.015 (ref 1.005–1.030)
pH: 5 (ref 5.0–8.0)

## 2024-02-02 LAB — BLOOD GAS, ARTERIAL
Acid-base deficit: 7.5 mmol/L — ABNORMAL HIGH (ref 0.0–2.0)
Bicarbonate: 17.4 mmol/L — ABNORMAL LOW (ref 20.0–28.0)
Drawn by: 36496
O2 Saturation: 99.1 %
Patient temperature: 36.7
pCO2 arterial: 33 mmHg (ref 32–48)
pH, Arterial: 7.33 — ABNORMAL LOW (ref 7.35–7.45)
pO2, Arterial: 190 mmHg — ABNORMAL HIGH (ref 83–108)

## 2024-02-02 LAB — BASIC METABOLIC PANEL WITH GFR
Anion gap: 15 (ref 5–15)
BUN: 17 mg/dL (ref 8–23)
CO2: 21 mmol/L — ABNORMAL LOW (ref 22–32)
Calcium: 8.3 mg/dL — ABNORMAL LOW (ref 8.9–10.3)
Chloride: 106 mmol/L (ref 98–111)
Creatinine, Ser: 1.41 mg/dL — ABNORMAL HIGH (ref 0.44–1.00)
GFR, Estimated: 34 mL/min — ABNORMAL LOW
Glucose, Bld: 138 mg/dL — ABNORMAL HIGH (ref 70–99)
Potassium: 4.5 mmol/L (ref 3.5–5.1)
Sodium: 142 mmol/L (ref 135–145)

## 2024-02-02 LAB — GLUCOSE, CAPILLARY
Glucose-Capillary: 133 mg/dL — ABNORMAL HIGH (ref 70–99)
Glucose-Capillary: 135 mg/dL — ABNORMAL HIGH (ref 70–99)
Glucose-Capillary: 179 mg/dL — ABNORMAL HIGH (ref 70–99)
Glucose-Capillary: 169 mg/dL — ABNORMAL HIGH (ref 70–99)
Glucose-Capillary: 151 mg/dL — ABNORMAL HIGH (ref 70–99)
Glucose-Capillary: 138 mg/dL — ABNORMAL HIGH (ref 70–99)

## 2024-02-02 LAB — MRSA NEXT GEN BY PCR, NASAL: MRSA by PCR Next Gen: NOT DETECTED

## 2024-02-02 LAB — ECHOCARDIOGRAM LIMITED
Height: 62 in
S' Lateral: 2.3 cm
Weight: 1601.42 [oz_av]

## 2024-02-02 LAB — LACTIC ACID, PLASMA
Lactic Acid, Venous: 4.2 mmol/L (ref 0.5–1.9)
Lactic Acid, Venous: 2.9 mmol/L (ref 0.5–1.9)
Lactic Acid, Venous: 2.2 mmol/L (ref 0.5–1.9)

## 2024-02-02 LAB — APTT: aPTT: 62 s — ABNORMAL HIGH (ref 24–36)

## 2024-02-02 LAB — AMMONIA: Ammonia: 32 umol/L (ref 9–35)

## 2024-02-02 LAB — HEPARIN LEVEL (UNFRACTIONATED): Heparin Unfractionated: >1.1 [IU]/mL — ABNORMAL HIGH (ref 0.30–0.70)

## 2024-02-02 MED ORDER — HEPARIN (PORCINE) 25000 UT/250ML-% IV SOLN
900.0000 [IU]/h | INTRAVENOUS | Status: DC
  Administered 2024-02-02: 650 [IU]/h via INTRAVENOUS
  Administered 2024-02-03: 900 [IU]/h via INTRAVENOUS
  Filled 2024-02-02 (×2): qty 250

## 2024-02-02 MED ORDER — SODIUM CHLORIDE 0.9 % IV SOLN
2.0000 g | INTRAVENOUS | Status: DC
  Administered 2024-02-02: 2 g via INTRAVENOUS
  Filled 2024-02-02: qty 20

## 2024-02-02 MED ORDER — CHLORHEXIDINE GLUCONATE CLOTH 2 % EX PADS
6.0000 | MEDICATED_PAD | Freq: Every day | CUTANEOUS | Status: DC
  Administered 2024-02-02 – 2024-02-03 (×2): 6 via TOPICAL

## 2024-02-02 MED ORDER — SODIUM CHLORIDE 0.9 % IV SOLN
100.0000 mg | Freq: Two times a day (BID) | INTRAVENOUS | Status: DC
  Administered 2024-02-02 – 2024-02-03 (×4): 100 mg via INTRAVENOUS
  Filled 2024-02-02 (×5): qty 100

## 2024-02-02 MED ORDER — LACTATED RINGERS IV BOLUS
1000.0000 mL | INTRAVENOUS | Status: AC
  Administered 2024-02-02: 1000 mL via INTRAVENOUS

## 2024-02-02 MED ORDER — NOREPINEPHRINE 4 MG/250ML-% IV SOLN
0.0000 ug/min | INTRAVENOUS | Status: AC
  Administered 2024-02-03 (×3): 10 ug/min via INTRAVENOUS
  Administered 2024-02-03: 5 ug/min via INTRAVENOUS
  Filled 2024-02-02 (×5): qty 250

## 2024-02-02 MED ORDER — SODIUM CHLORIDE 0.9 % IV BOLUS
500.0000 mL | INTRAVENOUS | Status: AC
  Administered 2024-02-02: 500 mL via INTRAVENOUS

## 2024-02-02 MED ORDER — LACTATED RINGERS IV SOLN: INTRAVENOUS | Status: DC

## 2024-02-02 MED ORDER — SODIUM CHLORIDE 0.9 % IV SOLN
250.0000 mL | INTRAVENOUS | Status: AC
  Administered 2024-02-02: 250 mL via INTRAVENOUS

## 2024-02-02 MED ORDER — MIDODRINE HCL 5 MG PO TABS: 10.0000 mg | ORAL_TABLET | ORAL | Status: AC

## 2024-02-02 MED ORDER — VANCOMYCIN HCL IN DEXTROSE 1-5 GM/200ML-% IV SOLN
1000.0000 mg | Freq: Once | INTRAVENOUS | Status: AC
  Administered 2024-02-02: 1000 mg via INTRAVENOUS
  Filled 2024-02-02: qty 200

## 2024-02-02 MED ORDER — ALBUMIN HUMAN 25 % IV SOLN
25.0000 g | INTRAVENOUS | Status: AC
  Administered 2024-02-02: 25 g via INTRAVENOUS
  Filled 2024-02-02: qty 100

## 2024-02-02 MED ORDER — VANCOMYCIN HCL 500 MG/100ML IV SOLN: 500.0000 mg | INTRAVENOUS | Status: DC

## 2024-02-02 MED ORDER — ORAL CARE MOUTH RINSE
15.0000 mL | OROMUCOSAL | Status: DC
  Administered 2024-02-02 – 2024-02-03 (×5): 15 mL via OROMUCOSAL

## 2024-02-02 MED ORDER — SODIUM CHLORIDE 0.9 % IV SOLN
2.0000 g | INTRAVENOUS | Status: DC
  Administered 2024-02-02: 2 g via INTRAVENOUS
  Filled 2024-02-02: qty 12.5

## 2024-02-02 MED ORDER — ORAL CARE MOUTH RINSE: 15.0000 mL | OROMUCOSAL | Status: AC | PRN

## 2024-02-02 MED ORDER — NOREPINEPHRINE 4 MG/250ML-% IV SOLN
INTRAVENOUS | Status: AC
  Administered 2024-02-02: 2 ug/min via INTRAVENOUS
  Filled 2024-02-02: qty 250

## 2024-02-02 MED ORDER — HYDROCORTISONE SOD SUC (PF) 100 MG IJ SOLR
100.0000 mg | Freq: Three times a day (TID) | INTRAMUSCULAR | Status: DC
  Administered 2024-02-02: 100 mg via INTRAVENOUS
  Filled 2024-02-02 (×2): qty 2

## 2024-02-02 MED ORDER — SODIUM CHLORIDE 0.9 % IV BOLUS: 500.0000 mL | Freq: Once | INTRAVENOUS | Status: DC

## 2024-02-02 NOTE — Progress Notes (Signed)
 PHARMACY - ANTICOAGULATION CONSULT NOTE  Pharmacy Consult for heparin  Indication: atrial fibrillation  Allergies[1]  Patient Measurements: Height: 5' 2 (157.5 cm) Weight: 45.4 kg (100 lb 1.4 oz) IBW/kg (Calculated) : 50.1 HEPARIN  DW (KG): 45.4  Vital Signs: Temp: 99.1 F (37.3 C) (01/29 1558) Temp Source: Axillary (01/29 1558) BP: 93/54 (01/29 1445) Pulse Rate: 104 (01/29 1445)  Labs: Recent Labs    01/31/24 2331 02/01/24 0430 02/02/24 0304 02/02/24 0811 02/02/24 1456  HGB  --  9.7* 9.8* 9.8*  --   HCT  --  29.5* 31.0* 30.3*  --   PLT  --  395 406* 409*  --   APTT  --   --   --   --  62*  HEPARINUNFRC  --   --   --   --  >1.10*  CREATININE 1.02* 1.00 1.41*  --   --     Estimated Creatinine Clearance: 17.5 mL/min (A) (by C-G formula based on SCr of 1.41 mg/dL (H)).  Assessment: 10 yoF presented with acute encephalopathy thought to be from CAP vs HF exacerbation.  PMH includes afib (on eliquis ), last dose of eliquis : 1/27 08:30. Pharmacy consulted to dose heparin . Will utilize aPTTs given heparin  level will be falsely elevated.  aPTT slightly under goal range on current rate 650 units/hr. No issues with heparin  or s/sx bleeding reported.  Goal of Therapy:  Heparin  level 0.3-0.7 units/ml aPTT 66-102 seconds Monitor platelets by anticoagulation protocol: Yes   Plan:  Increase heparin  infusion to 700 units/hr Check heparin  level in 8 hours and daily while on heparin  Monitor with aPTTs until correlates with heparin  level Continue to monitor H&H and platelets  Thank you for allowing pharmacy to be a part of this patients care.  Shelba Collier, PharmD, BCPS Clinical Pharmacist    [1]  Allergies Allergen Reactions   Lidocaine  Palpitations and Other (See Comments)    True tachycardia, dizziness, vertigo Shakes, Causes her to cry   Amoxicillin Hives and Itching   Sulfa Antibiotics Hives and Itching   Ciprofloxacin Diarrhea

## 2024-02-02 NOTE — Progress Notes (Addendum)
 eLink Physician-Brief Progress Note Patient Name: Lisa Lang DOB: [birth year] MRN: [id number]   Date of Service  02/02/2024  HPI/Events of Note  11 F history of afib on apixaban , recent DCCV, mod to severe MR, hypothyroidism, recently admitted for flu and UTI. She initially presented 1/27  from rehab facility due to altered mental status found slumped over in her chair. Treataed for pneumonia. Transferred to ICU 1.29 due to hypotension with increasing WBC. She was gently hydrated and antibiotics escalated to vancomycin  and cefepime .  eICU Interventions  Has not been initiated on norepinephrine  at this time. Continued on gentle volume resuscitation     Intervention Category Evaluation Type: New Patient Evaluation  Damien ONEIDA Grout 02/02/2024, 6:26 AM

## 2024-02-02 NOTE — Significant Event (Addendum)
 Primary RN notified on call hospitalist Dr. Sundil that pt is hypotensive, bp 73/42 map 52. Hospitalist putting in orders See MAR. Rapid notified.

## 2024-02-02 NOTE — Sepsis Progress Note (Signed)
 Elink monitoring for the code sepsis protocol.

## 2024-02-02 NOTE — Progress Notes (Signed)
 PHARMACY - ANTICOAGULATION CONSULT NOTE  Pharmacy Consult for heparin  Indication: atrial fibrillation  Allergies[1]  Patient Measurements: Height: 5' 2 (157.5 cm) Weight: 45.4 kg (100 lb 1.4 oz) IBW/kg (Calculated) : 50.1 HEPARIN  DW (KG): 45.4  Vital Signs: Temp: 98 F (36.7 C) (01/29 0340) Temp Source: Oral (01/29 0340) BP: 96/42 (01/29 0452) Pulse Rate: 99 (01/29 0452)  Labs: Recent Labs    01/31/24 2103 01/31/24 2331 02/01/24 0430 02/02/24 0304  HGB 10.6*  --  9.7* 9.8*  HCT 32.4*  --  29.5* 31.0*  PLT 461*  --  395 406*  CREATININE  --  1.02* 1.00 1.41*    Estimated Creatinine Clearance: 17.5 mL/min (A) (by C-G formula based on SCr of 1.41 mg/dL (H)).   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   PAF (paroxysmal atrial fibrillation) (HCC) 11/2017   CHA2DS2 VASc score > 3 (Age >72 and female sex), --> Eliquis  & Metoprolol     Assessment: 16 yoF presented with acute encephalopathy thought to be from CAP vs HF exacerbation. Pharmacy consulted to dose heparin . PM includes afib (on eliquis )  -Hgb 9s, plts 406 -Last dose of eliquis : 1/27 08:30-will utilize aPTTs given heparin  level will be falsely elevated  Goal of Therapy:  Heparin  level 0.3-0.7 units/ml aPTT 66-102 seconds Monitor platelets by anticoagulation protocol: Yes   Plan:  No bolus given recent eliquis  use Start heparin  infusion at 650 units/hr Check aPTT and  anti-Xa level in 8 hours and daily while on heparin  Monitor with aPTTs until correlates with heparin  level Continue to monitor H&H and platelets  Lynwood Poplar, PharmD, BCPS Clinical Pharmacist 02/02/2024 5:48 AM        [1]  Allergies Allergen Reactions   Lidocaine  Palpitations and Other (See Comments)    True tachycardia, dizziness, vertigo Shakes, Causes her to  cry   Amoxicillin Hives and Itching   Sulfa Antibiotics Hives and Itching   Ciprofloxacin Diarrhea

## 2024-02-02 NOTE — Progress Notes (Signed)
 Pharmacy Antibiotic Note  Lisa Lang is a 89 y.o. female admitted on 01/31/2024 with sepsis due to pneumonia.  Pharmacy has been consulted for cefepime /vancomycin  dosing.  -Received Ceftriaxone  1g IV x2, azithro x1 -WBC 12.9 > 42.9, low grade fever, Lactate 4.2, sCr 1.41 (bl ~1) -Blood cultures collected on 1/27, MRSA PCR added  Plan: -Cefepime  2g IV every 24 hours -Vancomycin  1g IV x1 -Vancomycin  500mg  IV every 48 hours (AUC 410, Vd 0.72, TBW, sCr 1.41) -Monitor renal function -Follow up signs of clinical improvement, LOT, de-escalation of antibiotics   Height: 5' 2 (157.5 cm) Weight: 45.4 kg (100 lb 1.4 oz) IBW/kg (Calculated) : 50.1  Temp (24hrs), Avg:99.5 F (37.5 C), Min:98 F (36.7 C), Max:100.6 F (38.1 C)  Recent Labs  Lab 01/31/24 2103 01/31/24 2109 01/31/24 2331 02/01/24 0007 02/01/24 0430 02/02/24 0304  WBC 14.8*  --   --   --  12.9* 42.9*  CREATININE  --   --  1.02*  --  1.00 1.41*  LATICACIDVEN  --  1.3  --  1.0  --  4.2*    Estimated Creatinine Clearance: 17.5 mL/min (A) (by C-G formula based on SCr of 1.41 mg/dL (H)).    Allergies[1]  Antimicrobials this admission: Ceftriaxone  1/27 >> 1/28 Cefepime  1/29 >>  Vancomycin  1/29 >>  Microbiology results: 1/27 BCx: NG < 12 hours 1/29 MRSA PCR: ordered  Thank you for allowing pharmacy to be a part of this patients care.  Lynwood Poplar, PharmD, BCPS Clinical Pharmacist 02/02/2024 4:36 AM       [1]  Allergies Allergen Reactions   Lidocaine  Palpitations and Other (See Comments)    True tachycardia, dizziness, vertigo Shakes, Causes her to cry   Amoxicillin Hives and Itching   Sulfa Antibiotics Hives and Itching   Ciprofloxacin Diarrhea

## 2024-02-02 NOTE — Consult Note (Signed)
 "  NAME:  MATY ZEISLER, MRN:  [id number], DOB:  June 19, 1927, LOS: 1 ADMISSION DATE:  01/31/2024, CONSULTATION DATE:  1/29 REFERRING MD:  Dr. Lee, CHIEF COMPLAINT:  septic shock   History of Present Illness:  Patient is a 89 yo F w/ pertinent PMH persistent AF on eliquis  recent DCCV, moderate-sever MR, hld, hypothyroidism presents to [facility] 1/27 sepsis 2/2 to pna.   Patient recently hospitalized 12/26-12/31 w/ flu and uti. Patient presents from her rehab facility to [facility] ED on 1/27 w/ ams. Was found slumped over in her chair. On arrival febrile 100.6 F and wbc 12.9. . CXR w/ cardiomegaly w/ central congestion and mild diffuse interstitial opacity. BNP 6379 and trop 85 > 83. UA small leukocytes. PCT 0.15. Started on cap abx and admitted by trh.  1/29 worsening BP. Given gentle iv fluids. WBC 42.9 this am. Abx broadened to cefepime /vanc. PCCM consulted.  Pertinent  Medical History   Past Medical History:  Diagnosis Date   Arthritis    Diverticulitis 1995   And 2007   Dysrhythmia    Hyperlipidemia LDL goal <100    Mitral valve prolapse    Bileaflet MVP with moderate MR.   PAF (paroxysmal atrial fibrillation) (HCC) 11/2017   CHA2DS2 VASc score > 3 (Age >52 and female sex), --> Eliquis  & Metoprolol       Significant Hospital Events: Including procedures, antibiotic start and stop dates in addition to other pertinent events   1/27 admit w/ sepsis presumed 2/2 to pna  Interim History / Subjective:  See above  Objective    Blood pressure (!) 85/66, pulse 100, temperature 98 F (36.7 C), temperature source Oral, resp. rate 17, height 5' 2 (1.575 m), weight 45.4 kg, SpO2 92%.       No intake or output data in the 24 hours ending 02/02/24 0439 Filed Weights   01/31/24 2104  Weight: 45.4 kg    Examination: General:   ill appearing elderly female HEENT: MM pink/moist Neuro: alert able to state name; confused to place/time; mae  CV: s1s2, RRR, no m/r/g PULM:  BS crackles on left,  dim clear BS on right; pulse ox not reading; placed on NRB GI: soft, bsx4 active  Extremities: warm/dry, no edema    Resolved problem list   Assessment and Plan   Shock: presumed septic 2/2 to pna; ua w/ small leukocytes Plan: -given iv fluids -if map <65 consider starting levo -consider midodrine  if able to take po -agree w/ broadening abx to cefepime /vanc; add doxy for atypical coverage -follow cultures; urine strep/legionella -check cortisol; start stress dose  Acute encephalopathy: presume 2/2 to sepsis/pna Plan: -check ammonia -treat sepsis as above -limit sedating meds -delirium precautions -consider imaging if mental status worsens  Acute respiratory failure w/ hypoxia Plan: -wean oxygen for sats >92% -having difficulty picking up sats; check abg  -cxr -pulm toilet  HFpEF Moderate-severe MR Plan: -repeat limited echo -daily weights; strict I/o's -hold home anti-htn meds  AKI Plan: -Trend BMP / urinary output -Replace electrolytes as indicated -Avoid nephrotoxic agents, ensure adequate renal perfusion  Anemia Plan: -trend cbc  Afib HLD Plan: -statin and amio when able to take po -start heparin  while npo and unable to take eliquis   Hypothyroidism Plan: -synthroid  when able to take po  GERD Plan: -PPI    Labs   CBC: Recent Labs  Lab 01/31/24 2103 02/01/24 0430 02/02/24 0304  WBC 14.8* 12.9* 42.9*  NEUTROABS 11.8* 10.8*  --   HGB 10.6* 9.7*  9.8*  HCT 32.4* 29.5* 31.0*  MCV 93.1 93.1 96.3  PLT 461* 395 406*    Basic Metabolic Panel: Recent Labs  Lab 01/31/24 2331 02/01/24 0430 02/02/24 0304  NA 137 139 142  K 4.5 4.1 4.5  CL 103 105 106  CO2 27 27 21*  GLUCOSE 101* 95 138*  BUN 13 12 17   CREATININE 1.02* 1.00 1.41*  CALCIUM  8.4* 8.5* 8.3*  MG  --  2.2  --    GFR: Estimated Creatinine Clearance: 17.5 mL/min (A) (by C-G formula based on SCr of 1.41 mg/dL (H)). Recent Labs  Lab 01/31/24 2103 01/31/24 2109  02/01/24 0007 02/01/24 0430 02/02/24 0304  PROCALCITON  --   --   --  0.15  --   WBC 14.8*  --   --  12.9* 42.9*  LATICACIDVEN  --  1.3 1.0  --  4.2*    Liver Function Tests: Recent Labs  Lab 01/31/24 2331 02/01/24 0430  AST 52* 47*  ALT 52* 50*  ALKPHOS 101 97  BILITOT 0.8 0.8  PROT 5.2* 5.0*  ALBUMIN  2.7* 2.8*   Recent Labs  Lab 01/31/24 2331  LIPASE 18   No results for input(s): AMMONIA in the last 168 hours.  ABG    Component Value Date/Time   TCO2 28 01/21/2023 1900     Coagulation Profile: No results for input(s): INR, PROTIME in the last 168 hours.  Cardiac Enzymes: No results for input(s): CKTOTAL, CKMB, CKMBINDEX, TROPONINI in the last 168 hours.  HbA1C: No results found for: HGBA1C  CBG: Recent Labs  Lab 01/31/24 2050  GLUCAP 102*    Review of Systems:   Patient is encephalopathic; therefore, history has been obtained from chart review.    Past Medical History:  She,  has a past medical history of Arthritis, Diverticulitis (1995), Dysrhythmia, Hyperlipidemia LDL goal <100, Mitral valve prolapse, and PAF (paroxysmal atrial fibrillation) (HCC) (11/2017).   Surgical History:   Past Surgical History:  Procedure Laterality Date   CARDIOVERSION N/A 10/28/2021   Procedure: CARDIOVERSION;  Surgeon: Hobart Powell BRAVO, MD;  Location: [facility] ENDOSCOPY;  Service: Cardiovascular;  Laterality: N/A;   CARDIOVERSION N/A 11/06/2021   Procedure: CARDIOVERSION;  Surgeon: Santo Stanly LABOR, MD;  Location: [facility] ENDOSCOPY;  Service: Cardiovascular;  Laterality: N/A;   CARDIOVERSION N/A 11/25/2023   Procedure: CARDIOVERSION;  Surgeon: Michele Richardson, DO;  Location: [facility] INVASIVE CV LAB;  Service: Cardiovascular;  Laterality: N/A;   CATARACT EXTRACTION, BILATERAL     CHOLECYSTECTOMY N/A 11/24/2021   Procedure: LAPAROSCOPIC CHOLECYSTECTOMY;  Surgeon: Eletha Boas, MD;  Location: [facility] ORS;  Service: General;  Laterality: N/A;   ERCP N/A 11/23/2021    Procedure: ENDOSCOPIC RETROGRADE CHOLANGIOPANCREATOGRAPHY (ERCP);  Surgeon: Saintclair Jasper, MD;  Location: [facility] ENDOSCOPY;  Service: Gastroenterology;  Laterality: N/A;   REMOVAL OF STONES  11/23/2021   Procedure: REMOVAL OF STONES;  Surgeon: Saintclair Jasper, MD;  Location: [facility] ENDOSCOPY;  Service: Gastroenterology;;   [name]  11/23/2021   Procedure: [name];  Surgeon: Saintclair Jasper, MD;  Location: [facility] ENDOSCOPY;  Service: Gastroenterology;;   TEE WITHOUT CARDIOVERSION N/A 10/28/2021   Procedure: TRANSESOPHAGEAL ECHOCARDIOGRAM (TEE) - SYNCHRONIZED DIRECT CURRENT CARDIOVERSION (DCCV);  Surgeon: Hobart Powell BRAVO, MD;  Location: [facility] ENDOSCOPY;  Service: CV;; LVEF 65-70%. No RWMA . Mod LA dilation w/o thrombus. Normal RV, Mod RA dilation. 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 Bileaflet Prolapse w/ Large Post & small Ant Jet.= 3+/Mod MR - no PVV reversal. = DCCV  TRANSTHORACIC ECHOCARDIOGRAM  10/25/2021   Notably he was very elderly woman.  No acute distress.  Just seems worn out.   TRANSTHORACIC ECHOCARDIOGRAM  12/12/2019    EF 60 to 65%.  No R WMA.  Unable to assess diastolic pressures.  Normal RV pressures.  Mild biatrial enlargement.  Moderate aortic calcification - no AS.  Severe BI-Leaflet MVP with Mod-Severe MR     Social History:   reports that she has never smoked. She has never used smokeless tobacco. She reports that she does not drink alcohol  and does not use drugs.   Family History:  Her family history includes CAD in her father; Cervical cancer in her mother; Lung cancer in her sister. There is no history of Stroke or Diabetes.   Allergies Allergies[1]   Home Medications  Prior to Admission medications  Medication Sig Start Date End Date Taking? Authorizing Provider  acetaminophen  (TYLENOL ) 325 MG tablet Take 2 tablets (650 mg total) by mouth every 4 (four) hours as needed. 11/25/21  Yes [name], MD  amiodarone  (PACERONE ) 100 MG tablet Take 100 mg by  mouth daily. 01/25/24  Yes [provider]  apixaban  (ELIQUIS ) 2.5 MG TABS tablet TAKE 1 TABLET TWICE A DAY 10/17/23  Yes [name] ORN, MD  atorvastatin  (LIPITOR) 40 MG tablet Take 40 mg by mouth at bedtime. 09/05/17  Yes [provider]  calcium  carbonate (TUMS - DOSED IN MG ELEMENTAL CALCIUM ) 500 MG chewable tablet Chew 1 tablet by mouth in the morning.   Yes [provider]  Cholecalciferol  25 MCG (1000 UT) capsule Take 1,000 Units by mouth in the morning.   Yes [provider]  levothyroxine  (SYNTHROID ) 75 MCG tablet Take 75 mcg by mouth daily before breakfast.   Yes [provider]  Multiple Vitamins-Minerals (MULTIVITAMIN WOMEN 50+) TABS Take 1 tablet by mouth in the morning.   Yes [provider]  pantoprazole  (PROTONIX ) 40 MG tablet Take 40 mg by mouth in the morning. 01/11/22  Yes [provider]     Critical care time: 45 minutes    JD Emilio DEVONNA [facility] 02/02/2024, 4:39 AM  Please see [email or web address] for pager details.  From 7A-7P if no response, please call [phone]. After hours, please call ELink [phone].           [1]  Allergies Allergen Reactions   Lidocaine  Palpitations and Other (See Comments)    True tachycardia, dizziness, vertigo Shakes, Causes her to cry   Amoxicillin Hives and Itching   Sulfa Antibiotics Hives and Itching   Ciprofloxacin Diarrhea   "

## 2024-02-02 NOTE — Significant Event (Addendum)
 Rapid Response Event Note   0232- Bedside RN called due to hypotension. MD had been notified and ordered 500ml NS bolus and albumin  25g which were infusing upon assessment. Pt laying in the fetal position in the bed moaning, oriented to self and place, skin warm and dry, lungs clear upon auscultation.  Lactic acid ordered and morning lab time adjusted to be drawn now. Current VS BP 89/56 HR 104 RR 26 O2 93%  0354- Bedside RN called due to pressure of 97/35. Still awaiting lactic result.  9572- Code sepsis called due to Lactic acid of 4.2. Dr. Sundil ordered 1L NS, abx, blood cultures, urine culture, sputum culture. Current blood pressure 115/74 taken at 0421. Patient remains oriented x2 and lungs clear. CCM consulted. Transferred to 2M05.

## 2024-02-02 NOTE — Progress Notes (Addendum)
 02/02/2024 Seen and examined. Echo showing dilated RV and IVC so doubt benefit to additional fluids +murmur on exam, noted prior mitral issues Small moderate BL effusion on US  simple appearing She is pretty frail and this is her second hospitalization in a month Caregiver at bedside and son aware prognosis is getting more guarded She is DNR. Would like trial of low dose pressors and abx I do not think we should escalate beyond this She does seem to have perked up a bit with fluids, lactate cleared, and levo only at 1. Let's see how day goes; if further deterioration would recommend comfort DC steroids, cortisol is appropriately high Pct interestingly is low, wonder if this is just aspiration and septic shock from that Will see how images look on more formal limited echo but her RV looks bigger to me on POCUS then what it looked like on 12/30/24  Rolan Sharps MD PCCM

## 2024-02-03 DIAGNOSIS — I340 Nonrheumatic mitral (valve) insufficiency: Secondary | ICD-10-CM | POA: Diagnosis not present

## 2024-02-03 DIAGNOSIS — A419 Sepsis, unspecified organism: Secondary | ICD-10-CM | POA: Diagnosis not present

## 2024-02-03 DIAGNOSIS — J9601 Acute respiratory failure with hypoxia: Secondary | ICD-10-CM | POA: Diagnosis not present

## 2024-02-03 DIAGNOSIS — R6521 Severe sepsis with septic shock: Secondary | ICD-10-CM

## 2024-02-03 LAB — BASIC METABOLIC PANEL WITH GFR
Anion gap: 13 (ref 5–15)
BUN: 29 mg/dL — ABNORMAL HIGH (ref 8–23)
CO2: 20 mmol/L — ABNORMAL LOW (ref 22–32)
Calcium: 8.2 mg/dL — ABNORMAL LOW (ref 8.9–10.3)
Chloride: 107 mmol/L (ref 98–111)
Creatinine, Ser: 1.42 mg/dL — ABNORMAL HIGH (ref 0.44–1.00)
GFR, Estimated: 34 mL/min — ABNORMAL LOW
Glucose, Bld: 106 mg/dL — ABNORMAL HIGH (ref 70–99)
Potassium: 4 mmol/L (ref 3.5–5.1)
Sodium: 140 mmol/L (ref 135–145)

## 2024-02-03 LAB — GLUCOSE, CAPILLARY
Glucose-Capillary: 134 mg/dL — ABNORMAL HIGH (ref 70–99)
Glucose-Capillary: 130 mg/dL — ABNORMAL HIGH (ref 70–99)
Glucose-Capillary: 137 mg/dL — ABNORMAL HIGH (ref 70–99)
Glucose-Capillary: 138 mg/dL — ABNORMAL HIGH (ref 70–99)
Glucose-Capillary: 125 mg/dL — ABNORMAL HIGH (ref 70–99)
Glucose-Capillary: 123 mg/dL — ABNORMAL HIGH (ref 70–99)

## 2024-02-03 LAB — CBC
HCT: 29.5 % — ABNORMAL LOW (ref 36.0–46.0)
Hemoglobin: 9.7 g/dL — ABNORMAL LOW (ref 12.0–15.0)
MCH: 30.6 pg (ref 26.0–34.0)
MCHC: 32.9 g/dL (ref 30.0–36.0)
MCV: 93.1 fL (ref 80.0–100.0)
Platelets: 403 10*3/uL — ABNORMAL HIGH (ref 150–400)
RBC: 3.17 MIL/uL — ABNORMAL LOW (ref 3.87–5.11)
RDW: 16.7 % — ABNORMAL HIGH (ref 11.5–15.5)
WBC: 48.8 10*3/uL — ABNORMAL HIGH (ref 4.0–10.5)
nRBC: 0 % (ref 0.0–0.2)

## 2024-02-03 LAB — APTT
aPTT: 62 s — ABNORMAL HIGH (ref 24–36)
aPTT: 64 s — ABNORMAL HIGH (ref 24–36)
aPTT: 94 s — ABNORMAL HIGH (ref 24–36)

## 2024-02-03 LAB — HEPARIN LEVEL (UNFRACTIONATED): Heparin Unfractionated: 0.96 [IU]/mL — ABNORMAL HIGH (ref 0.30–0.70)

## 2024-02-03 LAB — URINE CULTURE
Culture: NO GROWTH
Special Requests: NORMAL

## 2024-02-03 LAB — STREP PNEUMONIAE URINARY ANTIGEN: Strep Pneumo Urinary Antigen: NEGATIVE

## 2024-02-03 LAB — LEGIONELLA PNEUMOPHILA SEROGP 1 UR AG: L. pneumophila Serogp 1 Ur Ag: NEGATIVE

## 2024-02-03 LAB — MAGNESIUM: Magnesium: 1.7 mg/dL (ref 1.7–2.4)

## 2024-02-03 MED ORDER — POLYVINYL ALCOHOL 1.4 % OP SOLN: 1.0000 [drp] | Freq: Four times a day (QID) | OPHTHALMIC | Status: DC | PRN

## 2024-02-03 MED ORDER — GLYCOPYRROLATE 1 MG PO TABS: 1.0000 mg | ORAL_TABLET | ORAL | Status: DC | PRN

## 2024-02-03 MED ORDER — ACETAMINOPHEN 325 MG PO TABS: 650.0000 mg | ORAL_TABLET | Freq: Four times a day (QID) | ORAL | Status: DC | PRN

## 2024-02-03 MED ORDER — SODIUM CHLORIDE 0.9 % IV SOLN: 250.0000 mL | INTRAVENOUS | Status: DC

## 2024-02-03 MED ORDER — PROMETHAZINE HCL 12.5 MG PO TABS: 12.5000 mg | ORAL_TABLET | Freq: Once | ORAL | Status: DC

## 2024-02-03 MED ORDER — MORPHINE BOLUS VIA INFUSION
5.0000 mg | INTRAVENOUS | Status: DC | PRN
  Administered 2024-02-03 – 2024-02-04 (×4): 5 mg via INTRAVENOUS

## 2024-02-03 MED ORDER — ACETAMINOPHEN 650 MG RE SUPP: 650.0000 mg | Freq: Four times a day (QID) | RECTAL | Status: DC | PRN

## 2024-02-03 MED ORDER — SODIUM CHLORIDE 0.9 % IV SOLN: INTRAVENOUS | Status: DC

## 2024-02-03 MED ORDER — LACTATED RINGERS IV BOLUS
1000.0000 mL | Freq: Once | INTRAVENOUS | Status: AC
  Administered 2024-02-03: 1000 mL via INTRAVENOUS

## 2024-02-03 MED ORDER — SODIUM CHLORIDE 0.9 % IV SOLN
2.0000 g | INTRAVENOUS | Status: DC
  Administered 2024-02-03: 2 g via INTRAVENOUS
  Filled 2024-02-03: qty 12.5

## 2024-02-03 MED ORDER — MORPHINE 100MG IN NS 100ML (1MG/ML) PREMIX INFUSION
INTRAVENOUS | Status: AC
  Filled 2024-02-03: qty 100

## 2024-02-03 MED ORDER — DEXTROMETHORPHAN POLISTIREX ER 30 MG/5ML PO SUER
10.0000 mg | Freq: Two times a day (BID) | ORAL | Status: DC
  Administered 2024-02-03 (×2): 10.2 mg via ORAL
  Filled 2024-02-03 (×3): qty 5

## 2024-02-03 MED ORDER — AMIODARONE HCL IN DEXTROSE 360-4.14 MG/200ML-% IV SOLN
INTRAVENOUS | Status: AC
  Filled 2024-02-03: qty 200

## 2024-02-03 MED ORDER — GLYCOPYRROLATE 0.2 MG/ML IJ SOLN: 0.2000 mg | INTRAMUSCULAR | Status: DC | PRN

## 2024-02-03 MED ORDER — PHENYLEPHRINE HCL-NACL 20-0.9 MG/250ML-% IV SOLN
0.0000 ug/min | INTRAVENOUS | Status: DC
  Administered 2024-02-03: 20 ug/min via INTRAVENOUS
  Filled 2024-02-03: qty 250

## 2024-02-03 MED ORDER — MIDODRINE HCL 5 MG PO TABS: 10.0000 mg | ORAL_TABLET | Freq: Three times a day (TID) | ORAL | Status: DC

## 2024-02-03 MED ORDER — MIDODRINE HCL 5 MG PO TABS
10.0000 mg | ORAL_TABLET | Freq: Three times a day (TID) | ORAL | Status: DC
  Administered 2024-02-03 (×2): 10 mg via ORAL
  Filled 2024-02-03 (×2): qty 2

## 2024-02-03 MED ORDER — MORPHINE 100MG IN NS 100ML (1MG/ML) PREMIX INFUSION
0.0000 mg/h | INTRAVENOUS | Status: DC
  Administered 2024-02-03: 5 mg/h via INTRAVENOUS

## 2024-02-03 MED ORDER — PHENYLEPHRINE HCL-NACL 20-0.9 MG/250ML-% IV SOLN: 25.0000 ug/min | INTRAVENOUS | Status: DC

## 2024-02-03 NOTE — Progress Notes (Addendum)
 eLink Physician-Brief Progress Note Patient Name: Lisa Lang DOB: 01/28/1927 MRN: [id number]   Date of Service  02/03/2024  HPI/Events of Note  Patient is maxed out on peripheral Levophed .  MAP goal is 65.  Current MAP is 61.  Initially put out about 600 cc of clear urine but says that has been dumped out, she is only put out about 10 cc of dark tea colored urine.  eICU Interventions  Echocardiogram reviewed.  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  IVC not visualized. Will give a fluid bolus to help improve MAP and also urine output.  I have asked bedside RN to call me when 500 cc of the fluid is done for reevaluation.     Intervention Category Intermediate Interventions: Hypotension - evaluation and management  Jerilynn Berg 02/03/2024, 12:46 AM  Addendum: Patient received about 200 cc of the fluid bolus and her oxygen saturation dropped to 90%.  Fluid bolus was stopped and oxygen rebounded back to 93%.  Will discontinue rest of fluid bolus.  Discussed with RN.  Denyse Fillion 02/03/2024, 1:12 AM  Addendum: Patient is hypotensive again with mean arterial pressure in the 50s and SBP occasionally dipping into the 70s.  She is maxed out on norepinephrine  peripherally. Will order phenylephrine  which can be tolerated at higher doses peripherally for hemodynamic stability.  Discussed with RN.  Addendum: RN reports patient had V. tach with heart rate up into the 130s.  On further questioning, she had a wide-complex tachycardia.  Currently heart rate is back in the 90s.  A.m. labs are pending. If he should have another such episode, I will discontinue norepinephrine  and maintain his Foley on phenylephrine  to reduce the beta effect precipitating tachycardia.  Jerilynn Berg 02/03/2024, 6:52 AM

## 2024-02-03 NOTE — Progress Notes (Addendum)
 PHARMACY - ANTICOAGULATION CONSULT NOTE  Pharmacy Consult for heparin  Indication: atrial fibrillation  Allergies[1]  Patient Measurements: Height: 5' 2 (157.5 cm) Weight: 46.8 kg (103 lb 2.8 oz) IBW/kg (Calculated) : 50.1 HEPARIN  DW (KG): 45.4  Vital Signs: Temp: 100.8 F (38.2 C) (01/30 1100) Temp Source: Bladder (01/30 0400) BP: 98/52 (01/30 1100) Pulse Rate: 101 (01/30 1100)  Labs: Recent Labs    01/31/24 2331 02/01/24 0430 02/02/24 0304 02/02/24 0811 02/02/24 1456 02/03/24 0029 02/03/24 0906  HGB  --  9.7* 9.8* 9.8*  --  9.7*  --   HCT  --  29.5* 31.0* 30.3*  --  29.5*  --   PLT  --  395 406* 409*  --  403*  --   APTT  --   --   --   --  62* 62* 64*  HEPARINUNFRC  --   --   --   --  >1.10*  --   --   CREATININE 1.02* 1.00 1.41*  --   --   --   --     Estimated Creatinine Clearance: 18 mL/min (A) (by C-G formula based on SCr of 1.41 mg/dL (H)).  Assessment: 57 yoF presented with acute encephalopathy thought to be from CAP vs HF exacerbation.  PMH includes afib (on eliquis ), last dose of eliquis : 1/27 08:30. Pharmacy consulted to dose heparin . Will utilize aPTTs given heparin  level will be falsely elevated.  aPTT 64 is slightly subtherapeutic with heparin  running at 800 units/hr. Hgb (9.7) and PLTs (403) are stable. Patient renal function is slightly worsened. Per RN, no report of pauses, issues with the line, or signs of bleeding. Will continue to collect both aPTT and heparin  levels, and will dose based on aPTT until levels correlate.    Goal of Therapy:  Heparin  level 0.3-0.7 units/ml aPTT 66-102 seconds Monitor platelets by anticoagulation protocol: Yes   Plan:  Increase heparin  to 900 units/hr Recheck aPTT after 8 hours to confirm therapeutic Monitor daily aPTT, heparin  levels, and CBC Monitor for any signs/symptoms of bleeding Continue to use aPTT for monitoring until aPTT and heparin  level correlate Follow up ability to transition back to oral  eliquis   Thank you for allowing pharmacy to be involved with this patient's care.  Mendel Barter, PharmD PGY1 Clinical Pharmacist [facility]  02/03/2024 11:14 AM     [1]  Allergies Allergen Reactions   Lidocaine  Palpitations and Other (See Comments)    True tachycardia, dizziness, vertigo Shakes, Causes her to cry   Amoxicillin Hives and Itching   Sulfa Antibiotics Hives and Itching   Ciprofloxacin Diarrhea

## 2024-02-03 NOTE — Progress Notes (Addendum)
 02/03/2024 Clinically worsening: WOB + ventricular arrythmias Camera'd in, looks much worse than when I saw her yesterday am Called son. [employer] Left VM for Peter. All in agreement to focus on comfort.  Rolan Sharps MD PCCM

## 2024-02-03 NOTE — Progress Notes (Signed)
 PHARMACY - ANTICOAGULATION CONSULT NOTE  Pharmacy Consult for heparin  Indication: atrial fibrillation  Allergies[1]  Patient Measurements: Height: 5' 2 (157.5 cm) Weight: 45.4 kg (100 lb 1.4 oz) IBW/kg (Calculated) : 50.1 HEPARIN  DW (KG): 45.4  Vital Signs: Temp: 100 F (37.8 C) (01/30 0015) Temp Source: Bladder (01/30 0000) BP: 109/44 (01/30 0015) Pulse Rate: 86 (01/30 0015)  Labs: Recent Labs    01/31/24 2331 02/01/24 0430 02/02/24 0304 02/02/24 0811 02/02/24 1456 02/03/24 0029  HGB  --  9.7* 9.8* 9.8*  --  9.7*  HCT  --  29.5* 31.0* 30.3*  --  29.5*  PLT  --  395 406* 409*  --  403*  APTT  --   --   --   --  62* 62*  HEPARINUNFRC  --   --   --   --  >1.10*  --   CREATININE 1.02* 1.00 1.41*  --   --   --     Estimated Creatinine Clearance: 17.5 mL/min (A) (by C-G formula based on SCr of 1.41 mg/dL (H)).  Assessment: 77 yoF presented with acute encephalopathy thought to be from CAP vs HF exacerbation.  PMH includes afib (on eliquis ), last dose of eliquis : 1/27 08:30. Pharmacy consulted to dose heparin . Will utilize aPTTs given heparin  level will be falsely elevated.  AM: aPTT 62 seconds x2, subtherapeutic, despite slight rate increase to 700 units/hr. No issues with heparin  running continuously, urine dark tinged, but CBC stable-will continue to monitor.   Goal of Therapy:  Heparin  level 0.3-0.7 units/ml aPTT 66-102 seconds Monitor platelets by anticoagulation protocol: Yes   Plan:  Increase heparin  infusion to 800 units/hr Check aPTT in 8 hours  Monitor with aPTTs until correlates with heparin  level Continue to monitor H&H and platelets  Rutha Poplar, PharmD, BCPS Clinical Pharmacist 02/03/2024 1:03 AM      [1]  Allergies Allergen Reactions   Lidocaine  Palpitations and Other (See Comments)    True tachycardia, dizziness, vertigo Shakes, Causes her to cry   Amoxicillin Hives and Itching   Sulfa Antibiotics Hives and Itching   Ciprofloxacin  Diarrhea

## 2024-02-03 NOTE — Progress Notes (Signed)
 PHARMACY - ANTICOAGULATION CONSULT NOTE  Pharmacy Consult for heparin  Indication: atrial fibrillation  Allergies[1]  Patient Measurements: Height: 5' 2 (157.5 cm) Weight: 46.8 kg (103 lb 2.8 oz) IBW/kg (Calculated) : 50.1 HEPARIN  DW (KG): 45.4  Vital Signs: Temp: 101.7 F (38.7 C) (01/30 2030) BP: 103/54 (01/30 2030) Pulse Rate: 84 (01/30 2030)  Labs: Recent Labs    02/01/24 0430 02/02/24 0304 02/02/24 0811 02/02/24 1456 02/02/24 1456 02/03/24 0029 02/03/24 0906 02/03/24 2011  HGB 9.7* 9.8* 9.8*  --   --  9.7*  --   --   HCT 29.5* 31.0* 30.3*  --   --  29.5*  --   --   PLT 395 406* 409*  --   --  403*  --   --   APTT  --   --   --  62*   < > 62* 64* 94*  HEPARINUNFRC  --   --   --  >1.10*  --   --   --  0.96*  CREATININE 1.00 1.41*  --   --   --   --  1.42*  --    < > = values in this interval not displayed.    Estimated Creatinine Clearance: 17.9 mL/min (A) (by C-G formula based on SCr of 1.42 mg/dL (H)).  Assessment: 6 yoF presented with acute encephalopathy thought to be from CAP vs HF exacerbation.  PMH includes afib (on eliquis ), last dose of eliquis : 1/27 08:30. Pharmacy consulted to dose heparin . Will utilize aPTTs given heparin  level will be falsely elevated.  aPTT 64 is slightly subtherapeutic with heparin  running at 800 units/hr. Hgb (9.7) and PLTs (403) are stable. Patient renal function is slightly worsened. Per RN, no report of pauses, issues with the line, or signs of bleeding. Will continue to collect both aPTT and heparin  levels, and will dose based on aPTT until levels correlate.   PM: aPTT 94 sec is therapeutic, not correlating with HL 0.96. No issues with the heparin  infusion or bleeding reported.   Goal of Therapy:  Heparin  level 0.3-0.7 units/ml aPTT 66-102 seconds Monitor platelets by anticoagulation protocol: Yes   Plan:  Continue heparin  at 900 units/hr Monitor daily aPTT, heparin  levels, and CBC Monitor for any signs/symptoms of  bleeding Continue to use aPTT for monitoring until aPTT and heparin  level correlate Follow up ability to transition back to oral eliquis   Thank you for allowing pharmacy to be involved with this patient's care.  Rocky Slade, PharmD, BCPS 02/03/2024 8:45 PM  Please check AMION for all [facility] phone numbers After 10:00 PM, call Main Pharmacy [phone]       [1]  Allergies Allergen Reactions   Lidocaine  Palpitations and Other (See Comments)    True tachycardia, dizziness, vertigo Shakes, Causes her to cry   Amoxicillin Hives and Itching   Sulfa Antibiotics Hives and Itching   Ciprofloxacin Diarrhea

## 2024-02-03 NOTE — Plan of Care (Signed)
  Problem: Pain Managment: Goal: General experience of comfort will improve and/or be controlled Outcome: Progressing   Problem: Safety: Goal: Ability to remain free from injury will improve Outcome: Progressing

## 2024-02-03 NOTE — Progress Notes (Signed)
 "  NAME:  Lisa Lang, MRN:  [id number], DOB:  [birth year], LOS: 2 ADMISSION DATE:  01/31/2024, CONSULTATION DATE:  1/29 REFERRING MD:  Dr. Lee, CHIEF COMPLAINT:  septic shock   History of Present Illness:  Patient is a 89 yo F w/ pertinent PMH persistent AF on eliquis  recent DCCV, moderate-sever MR, hld, hypothyroidism presents to [facility] 1/27 sepsis 2/2 to pna.   Patient recently hospitalized 12/26-12/31 w/ flu and uti. Patient presents from her rehab facility to [facility] ED on 1/27 w/ ams. Was found slumped over in her chair. On arrival febrile 100.6 F and wbc 12.9. . CXR w/ cardiomegaly w/ central congestion and mild diffuse interstitial opacity. BNP 6379 and trop 85 > 83. UA small leukocytes. PCT 0.15. Started on cap abx and admitted by trh.  1/29 worsening BP. Given gentle iv fluids. WBC 42.9. Abx broadened to cefepime /vanc. PCCM consulted.  Pertinent  Medical History   Past Medical History:  Diagnosis Date   Arthritis    Diverticulitis 1995   And 2007   Dysrhythmia    Hyperlipidemia LDL goal <100    Mitral valve prolapse    Bileaflet MVP with moderate MR.   PAF (paroxysmal atrial fibrillation) (HCC) 11/2017   CHA2DS2 VASc score > 3 (Age >33 and female sex), --> Eliquis  & Metoprolol       Significant Hospital Events: Including procedures, antibiotic start and stop dates in addition to other pertinent events   1/27 admit w/ sepsis presumed 2/2 to pna 1/29 admit to PCCM   Interim History / Subjective:  Saw pt at bedside. Answers questions but appears weak.   Objective    Blood pressure 106/88, pulse 93, temperature 100 F (37.8 C), resp. rate (!) 25, height 5' 2 (1.575 m), weight 46.8 kg, SpO2 92%.        Intake/Output Summary (Last 24 hours) at 02/03/2024 0718 Last data filed at 02/03/2024 9355 Gross per 24 hour  Intake 2457.05 ml  Output 745 ml  Net 1712.05 ml   Filed Weights   01/31/24 2104 02/03/24 0500  Weight: 45.4 kg 46.8 kg    Examination: General:   ill  appearing elderly female, coughing extensively   Neuro: alert and answers questions, doesn't open eyes CV: systolic murmer in LUSB PULM: reduced breath sounds BL  GI: soft, no TTP Extremities: warm/dry, no edema    Resolved problem list   Assessment and Plan   Shock 2/2 to infection vs severe MV regurgitation   - presumed septic 2/2 to pna initially -O2 requirements are going up. -Low BP and currently on max dose levo with an addition of phenylephrine  overnight.  -Unsure the cause of her condition at present. WBC elevation is very high to r/o infection. This markedly high WBC count can't be explained by steroids that were d/c yesterday. 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  -Will observe for one more day. if condition gets better, infection more likeley. If condition worsens then primarily driven by cardiac cause for which valve replacement is not ideal for her age. Consider comfort and involve palliative then.  -Son updated and states understanding.   Plan: -Levo , phenyl  -Midodrine  TID fir SBP> 90 - cefepime , doxy -d/c vanc  -follow cultures; urine strep/legionella - d/c stress dose steroids yesterday  Acute encephalopathy: presume 2/2 to above Mentation has improved.  Plan: Per above   Acute respiratory failure w/ hypoxia Cough -O2 requirements increasing per above  -Lingering cough after flu  from last month. Will treat cough.   Plan: -Delsym  BID -Tessalon  PRN -pulm toilet  HFpEF Moderate-severe MR repeat echo shows Normal LV with EF 65-70%; mod-severe MR  Plan -daily weights; strict I/o's -hold home anti-htn meds -see above for more information   AKI BUN, Cr increasing.  -Able to tolerate PO intake; Will hold giving more fluids;   Plan: -Ensure adequate PO intake  -Trend BMP / urinary output -Replace electrolytes as indicated -Avoid nephrotoxic agents, ensure adequate renal perfusion  Anemia Hgb stable    Plan: -trend cbc  Afib HLD Plan: -statin and amio  -on heparin ; consider eliquis  when able   Hypothyroidism Plan: -synthroid    GERD Plan: -PPI    Labs   CBC: Recent Labs  Lab 01/31/24 2103 02/01/24 0430 02/02/24 0304 02/02/24 0811 02/03/24 0029  WBC 14.8* 12.9* 42.9* 41.8* 48.8*  NEUTROABS 11.8* 10.8*  --   --   --   HGB 10.6* 9.7* 9.8* 9.8* 9.7*  HCT 32.4* 29.5* 31.0* 30.3* 29.5*  MCV 93.1 93.1 96.3 95.9 93.1  PLT 461* 395 406* 409* 403*    Basic Metabolic Panel: Recent Labs  Lab 01/31/24 2331 02/01/24 0430 02/02/24 0304  NA 137 139 142  K 4.5 4.1 4.5  CL 103 105 106  CO2 27 27 21*  GLUCOSE 101* 95 138*  BUN 13 12 17   CREATININE 1.02* 1.00 1.41*  CALCIUM  8.4* 8.5* 8.3*  MG  --  2.2  --    GFR: Estimated Creatinine Clearance: 18 mL/min (A) (by C-G formula based on SCr of 1.41 mg/dL (H)). Recent Labs  Lab 02/01/24 0007 02/01/24 0430 02/02/24 0304 02/02/24 0811 02/02/24 1049 02/03/24 0029  PROCALCITON  --  0.15  --   --   --   --   WBC  --  12.9* 42.9* 41.8*  --  48.8*  LATICACIDVEN 1.0  --  4.2* 2.9* 2.2*  --     Liver Function Tests: Recent Labs  Lab 01/31/24 2331 02/01/24 0430  AST 52* 47*  ALT 52* 50*  ALKPHOS 101 97  BILITOT 0.8 0.8  PROT 5.2* 5.0*  ALBUMIN  2.7* 2.8*   Recent Labs  Lab 01/31/24 2331  LIPASE 18   Recent Labs  Lab 02/02/24 0811  AMMONIA 32    ABG    Component Value Date/Time   PHART 7.33 (L) 02/02/2024 0530   PCO2ART 33 02/02/2024 0530   PO2ART 190 (H) 02/02/2024 0530   HCO3 17.4 (L) 02/02/2024 0530   TCO2 28 01/21/2023 1900   ACIDBASEDEF 7.5 (H) 02/02/2024 0530   O2SAT 99.1 02/02/2024 0530     Coagulation Profile: No results for input(s): INR, PROTIME in the last 168 hours.  Cardiac Enzymes: No results for input(s): CKTOTAL, CKMB, CKMBINDEX, TROPONINI in the last 168 hours.  HbA1C: No results found for: HGBA1C  CBG: Recent Labs  Lab 02/02/24 1136 02/02/24 1535  02/02/24 1925 02/02/24 2328 02/03/24 0307  GLUCAP 179* 169* 151* 138* 134*    Review of Systems:   Patient is encephalopathic; therefore, history has been obtained from chart review.    Past Medical History:  She,  has a past medical history of Arthritis, Diverticulitis (1995), Dysrhythmia, Hyperlipidemia LDL goal <100, Mitral valve prolapse, and PAF (paroxysmal atrial fibrillation) (HCC) (11/2017).   Surgical History:   Past Surgical History:  Procedure Laterality Date   CARDIOVERSION N/A 10/28/2021   Procedure: CARDIOVERSION;  Surgeon: Hobart Powell BRAVO, MD;  Location[facility] ENDOSCOPY;  Service: Cardiovascular;  Laterality: N/A;  CARDIOVERSION N/A 11/06/2021   Procedure: CARDIOVERSION;  Surgeon: Santo Stanly LABOR, MD;  Location: [facility] ENDOSCOPY;  Service: Cardiovascular;  Laterality: N/A;   CARDIOVERSION N/A 11/25/2023   Procedure: CARDIOVERSION;  Surgeon: Michele Richardson, DO;  Location: [facility] INVASIVE CV LAB;  Service: Cardiovascular;  Laterality: N/A;   CATARACT EXTRACTION, BILATERAL     CHOLECYSTECTOMY N/A 11/24/2021   Procedure: LAPAROSCOPIC CHOLECYSTECTOMY;  Surgeon: Eletha Boas, MD;  Location: [facility] ORS;  Service: General;  Laterality: N/A;   ERCP N/A 11/23/2021   Procedure: ENDOSCOPIC RETROGRADE CHOLANGIOPANCREATOGRAPHY (ERCP);  Surgeon: Saintclair Jasper, MD;  Location: [facility] ENDOSCOPY;  Service: Gastroenterology;  Laterality: N/A;   REMOVAL OF STONES  11/23/2021   Procedure: REMOVAL OF STONES;  Surgeon: Saintclair Jasper, MD;  Location: [facility] ENDOSCOPY;  Service: Gastroenterology;;   [name]  11/23/2021   Procedure: [name];  Surgeon: Saintclair Jasper, MD;  Location: [facility] ENDOSCOPY;  Service: Gastroenterology;;   TEE WITHOUT CARDIOVERSION N/A 10/28/2021   Procedure: TRANSESOPHAGEAL ECHOCARDIOGRAM (TEE) - SYNCHRONIZED DIRECT CURRENT CARDIOVERSION (DCCV);  Surgeon: Hobart Powell BRAVO, MD;  Location: [facility] ENDOSCOPY;  Service: CV;; LVEF 65-70%. No RWMA . Mod LA dilation w/o thrombus. Normal  RV, Mod RA dilation. 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 Bileaflet Prolapse w/ Large Post & small Ant Jet.= 3+/Mod MR - no PVV reversal. = DCCV   TRANSTHORACIC ECHOCARDIOGRAM  10/25/2021   Notably he was very elderly woman.  No acute distress.  Just seems worn out.   TRANSTHORACIC ECHOCARDIOGRAM  12/12/2019    EF 60 to 65%.  No R WMA.  Unable to assess diastolic pressures.  Normal RV pressures.  Mild biatrial enlargement.  Moderate aortic calcification - no AS.  Severe BI-Leaflet MVP with Mod-Severe MR     Social History:   reports that she has never smoked. She has never used smokeless tobacco. She reports that she does not drink alcohol  and does not use drugs.   Family History:  Her family history includes CAD in her father; Cervical cancer in her mother; Lung cancer in her sister. There is no history of Stroke or Diabetes.   Allergies Allergies[1]   Home Medications  Prior to Admission medications  Medication Sig Start Date End Date Taking? Authorizing Provider  acetaminophen  (TYLENOL ) 325 MG tablet Take 2 tablets (650 mg total) by mouth every 4 (four) hours as needed. 11/25/21  Yes [name], MD  amiodarone  (PACERONE ) 100 MG tablet Take 100 mg by mouth daily. 01/25/24  Yes [provider]  apixaban  (ELIQUIS ) 2.5 MG TABS tablet TAKE 1 TABLET TWICE A DAY 10/17/23  Yes [name] ORN, MD  atorvastatin  (LIPITOR) 40 MG tablet Take 40 mg by mouth at bedtime. 09/05/17  Yes [provider]  calcium  carbonate (TUMS - DOSED IN MG ELEMENTAL CALCIUM ) 500 MG chewable tablet Chew 1 tablet by mouth in the morning.   Yes [provider]  Cholecalciferol  25 MCG (1000 UT) capsule Take 1,000 Units by mouth in the morning.   Yes [provider]  levothyroxine  (SYNTHROID ) 75 MCG tablet Take 75 mcg by mouth daily before breakfast.   Yes [provider]  Multiple Vitamins-Minerals (MULTIVITAMIN WOMEN 50+) TABS Take 1 tablet by mouth in the  morning.   Yes [provider]  pantoprazole  (PROTONIX ) 40 MG tablet Take 40 mg by mouth in the morning. 01/11/22  Yes [provider]     Critical care time: 45 minutes    JD Emilio DEVONNA [facility] 02/03/2024, 7:18 AM  Please see [email or web address] for pager details.  From 7A-7P if no response, please call [phone]. After hours, please call ELink [phone].            [1]  Allergies Allergen Reactions   Lidocaine  Palpitations and Other (See Comments)    True tachycardia, dizziness, vertigo Shakes, Causes her to cry   Amoxicillin Hives and Itching   Sulfa Antibiotics Hives and Itching   Ciprofloxacin Diarrhea   "

## 2024-02-04 NOTE — Progress Notes (Signed)
 SPIRITUAL CARE AND COUNSELING CONSULT NOTE   VISIT SUMMARY Chaplain and RN Zachary provided compassionate presence to pt Lisa Lang. After a time, her caregiver arrived and connected Lisa Lang with Lisa Lang, Lisa Lang, via phone. Provided support and prayer both before and following Lisa Lang's passing.  SPIRITUAL ENCOUNTER                                                                                                                                                                      Type of Visit: Initial Care provided to:: Patient, Lisa Lang partners present during encounter: Nurse Referral source: Nurse (RN/NT/LPN) Reason for visit: Patient death OnCall Visit: Yes  If immediate needs arise, please contact Lisa Lang 24 hour on call [phone]   Lisa Lang, Chaplain  08-Feb-2024 8:24 AM

## 2024-02-05 LAB — CULTURE, BLOOD (ROUTINE X 2)
Culture: NO GROWTH
Culture: NO GROWTH
Special Requests: ADEQUATE
Special Requests: ADEQUATE

## 2024-02-05 NOTE — Death Summary Note (Signed)
 " DEATH SUMMARY   Patient Details  Name: Lisa Lang MRN: [id number] DOB: 05-18-1927  Admission/Discharge Information   Admit Date:  02-23-24  Date of Death:  02-27-2024  Time of Death:  03-08-35  Length of Stay: 3  Referring Physician: Cleotilde, [place]  E, PA     Diagnoses  Preliminary cause of death: community acquired pneumonia of left lower lobe Secondary Diagnoses (including complications and co-morbidities):  Progressive septic and cardiogenic shock Progressive hypoxemic respiratory failure   Brief Hospital Course (including significant findings, care, treatment, and services provided and events leading to death)  Patient is a 89 yo F w/ pertinent PMH persistent AF on eliquis  recent DCCV, moderate-sever MR, hld, hypothyroidism presents to [facility] Feb 23, 2024 sepsis 2/2 to pna.    Patient recently hospitalized 12/26-12/31 w/ flu and uti. Patient presents from her rehab facility to [facility] ED on 02/23/2024 w/ ams. Was found slumped over in her chair. On arrival febrile 100.6 F and wbc 12.9. . CXR w/ cardiomegaly w/ central congestion and mild diffuse interstitial opacity. BNP 6379 and trop 85 > 83. UA small leukocytes. PCT 0.15. Started on cap abx and admitted by trh.   1/29 worsening BP. Given gentle iv fluids. WBC 42.9 this am. Abx broadened to cefepime /vanc. PCCM consulted.   Despite antibiotics she developed a progressive shock state, increased work of breathing, and confusion.  GOC discussion held with family and friends on the evening of 1/30 and all in agreement to transition to comfort.  She passed peacefully with her caregiver at bedside.    Pertinent Labs and Studies  Significant Diagnostic Studies ECHOCARDIOGRAM LIMITED Result Date: 02/02/2024    ECHOCARDIOGRAM LIMITED REPORT   Patient Name:   Lisa Lang Date of Exam: 02/02/2024 Medical Rec #:  [id number]     Height:       62.0 in Accession #:    [id number]    Weight:       100.1 lb Date of Birth:  [birth year]      BSA:          1.424 m  Patient Age:    89 years      BP:           93/48 mmHg Patient Gender: F             HR:           102 bpm. Exam Location:  Inpatient Procedure: Limited Echo (Both Spectral and Color Flow Doppler were utilized            during procedure). Indications:    Shock R57.9  History:        Patient has prior history of Echocardiogram examinations, most                 recent 12/30/2023. Signs/Symptoms:Hypertensive Heart Disease.  Sonographer:    Nathanel Devonshire Referring Phys: [id number] NORLEEN D PAYNE IMPRESSIONS  1. Left ventricular ejection fraction, by estimation, is 65 to 70%. The left ventricle has normal function.  2. The mitral valve is myxomatous with a large over riding anterior leaflet and a very small restricted posterior leaflet. Cannot rule out partially flail anterior leaflet. There is moderate to severe posteriorly directed mitral regurgitation. The mitral valve is myxomatous. Moderate mitral valve regurgitation. There is severe holosystolic prolapse of the middle segment of the anterior leaflet of the mitral valve.  3. Compared to images from [facility] in 07-Mar-2021, there is no significant change. FINDINGS  Left Ventricle: Left ventricular ejection fraction,  by estimation, is 65 to 70%. The left ventricle has normal function. Mitral Valve: The mitral valve is myxomatous with a large over riding anterior leaflet and a very small restricted posterior leaflet. Cannot rule out partially flail anterior leaflet. There is moderate to severe posteriorly directed mitral regurgitation.  The mitral valve is myxomatous. There is severe holosystolic prolapse of the middle segment of the anterior leaflet of the mitral valve. Moderate mitral valve regurgitation, with eccentric posteriorly directed jet. Tricuspid Valve: Tricuspid valve regurgitation is mild. Venous: The inferior vena cava was not well visualized. LEFT VENTRICLE PLAX 2D LVIDd:         4.00 cm LVIDs:         2.30 cm LV PW:         0.90 cm LV IVS:        1.00 cm  Wilbert Bihari MD  Electronically signed by Wilbert Bihari MD Signature Date/Time: 02/02/2024/8:20:24 PM    Final    DG Chest Port 1 View Result Date: 02/02/2024 EXAM: 1 VIEW XRAY OF THE CHEST 02/02/2024 06:38:00 AM COMPARISON: 01/31/2024 CLINICAL HISTORY: Acute respiratory failure with hypoxemia. ICD10: [id number] Acute respiratory failure with hypoxemia (HCC). FINDINGS: LUNGS AND PLEURA: There is moderate diffuse pulmonary edema. Left pleural effusion appears increase from previous exam. Progressive retrocardiac opacification noted, which may reflect atelectasis, asymmetric edema, or pneumonia. No pneumothorax. HEART AND MEDIASTINUM: Stable cardiomegaly. Aortic atherosclerosis. BONES AND SOFT TISSUES: No acute osseous abnormality. IMPRESSION: 1. Moderate diffuse pulmonary edema. 2. Left pleural effusion, increased from previous exam. 3. Progressive retrocardiac opacification, which may reflect atelectasis, asymmetric edema, or pneumonia. Electronically signed by: Waddell Calk MD 02/02/2024 07:15 AM EST RP Workstation: [id number]   DG Chest Portable 1 View Result Date: 01/31/2024 CLINICAL DATA:  History of pneumonia EXAM: PORTABLE CHEST 1 VIEW COMPARISON:  12/30/2023, 10/04/2023 FINDINGS: Cardiomegaly with central congestion and mild diffuse interstitial opacity suggestive of edema. Possible trace pleural effusions. Patchy atelectasis or minimal infiltrate left lung base. Aortic atherosclerosis. IMPRESSION: Cardiomegaly with central congestion and mild diffuse interstitial opacity suggestive of edema. Possible trace pleural effusions. Patchy atelectasis or minimal infiltrate at the left base. Electronically Signed   By: Luke Bun M.D.   On: 01/31/2024 21:35   CT Head Wo Contrast Result Date: 01/12/2024 EXAM: CT HEAD AND CERVICAL SPINE 01/12/2024 01:43:17 AM TECHNIQUE: CT of the head and cervical spine was performed without the administration of intravenous contrast. Multiplanar reformatted images are provided for review.  Automated exposure control, iterative reconstruction, and/or weight based adjustment of the mA/kV was utilized to reduce the radiation dose to as low as reasonably achievable. COMPARISON: Comparison from 12/30/2023. CLINICAL HISTORY: Head trauma, minor (Age >= 65y) FINDINGS: CT HEAD BRAIN AND VENTRICLES: No acute intracranial hemorrhage. No mass effect or midline shift. No abnormal extra-axial fluid collection. No evidence of acute infarct. No hydrocephalus. ORBITS: No acute abnormality. SINUSES AND MASTOIDS: No acute abnormality. SOFT TISSUES AND SKULL: No acute skull fracture. No acute soft tissue abnormality. CT CERVICAL SPINE BONES AND ALIGNMENT: No acute fracture or traumatic malalignment. Similar grade 1 anterolisthesis of C4 on C5. DEGENERATIVE CHANGES: No significant degenerative changes. SOFT TISSUES: No prevertebral soft tissue swelling. IMPRESSION: 1. No acute intracranial abnormality. 2. No acute fracture or traumatic malalignment of the cervical spine. Electronically signed by: Gilmore Molt MD 01/12/2024 01:58 AM EST RP Workstation: [id number]   CT Cervical Spine Wo Contrast Result Date: 01/12/2024 EXAM: CT HEAD AND CERVICAL SPINE 01/12/2024 01:43:17 AM TECHNIQUE: CT of the head and cervical  spine was performed without the administration of intravenous contrast. Multiplanar reformatted images are provided for review. Automated exposure control, iterative reconstruction, and/or weight based adjustment of the mA/kV was utilized to reduce the radiation dose to as low as reasonably achievable. COMPARISON: Comparison from 12/30/2023. CLINICAL HISTORY: Head trauma, minor (Age >= 65y) FINDINGS: CT HEAD BRAIN AND VENTRICLES: No acute intracranial hemorrhage. No mass effect or midline shift. No abnormal extra-axial fluid collection. No evidence of acute infarct. No hydrocephalus. ORBITS: No acute abnormality. SINUSES AND MASTOIDS: No acute abnormality. SOFT TISSUES AND SKULL: No acute skull fracture. No  acute soft tissue abnormality. CT CERVICAL SPINE BONES AND ALIGNMENT: No acute fracture or traumatic malalignment. Similar grade 1 anterolisthesis of C4 on C5. DEGENERATIVE CHANGES: No significant degenerative changes. SOFT TISSUES: No prevertebral soft tissue swelling. IMPRESSION: 1. No acute intracranial abnormality. 2. No acute fracture or traumatic malalignment of the cervical spine. Electronically signed by: Gilmore Molt MD 01/12/2024 01:58 AM EST RP Workstation: [id number]    Microbiology Recent Results (from the past 240 hours)  Resp panel by RT-PCR (RSV, Flu A&B, Covid) Anterior Nasal Swab     Status: None   Collection Time: 01/31/24  8:51 PM   Specimen: Anterior Nasal Swab  Result Value Ref Range Status   SARS Coronavirus 2 by RT PCR NEGATIVE NEGATIVE Final   Influenza A by PCR NEGATIVE NEGATIVE Final   Influenza B by PCR NEGATIVE NEGATIVE Final    Comment: (NOTE) The Xpert Xpress SARS-CoV-2/FLU/RSV plus assay is intended as an aid in the diagnosis of influenza from Nasopharyngeal swab specimens and should not be used as a sole basis for treatment. Nasal washings and aspirates are unacceptable for Xpert Xpress SARS-CoV-2/FLU/RSV testing.  Fact Sheet for Patients: bloggercourse.com  Fact Sheet for Healthcare Providers: seriousbroker.it  This test is not yet approved or cleared by the [place]  FDA and has been authorized for detection and/or diagnosis of SARS-CoV-2 by FDA under an Emergency Use Authorization (EUA). This EUA will remain in effect (meaning this test can be used) for the duration of the COVID-19 declaration under Section 564(b)(1) of the Act, 21 U.S.C. section 360bbb-3(b)(1), unless the authorization is terminated or revoked.     Resp Syncytial Virus by PCR NEGATIVE NEGATIVE Final    Comment: (NOTE) Fact Sheet for Patients: bloggercourse.com  Fact Sheet for Healthcare  Providers: seriousbroker.it  This test is not yet approved or cleared by the [place]  FDA and has been authorized for detection and/or diagnosis of SARS-CoV-2 by FDA under an Emergency Use Authorization (EUA). This EUA will remain in effect (meaning this test can be used) for the duration of the COVID-19 declaration under Section 564(b)(1) of the Act, 21 U.S.C. section 360bbb-3(b)(1), unless the authorization is terminated or revoked.  Performed at [facility], [address] [place], [place] [address]   Blood culture (routine x 2)     Status: None (Preliminary result)   Collection Time: 01/31/24  8:51 PM   Specimen: BLOOD LEFT ARM  Result Value Ref Range Status   Specimen Description BLOOD LEFT ARM  Final   Special Requests   Final    BOTTLES DRAWN AEROBIC AND ANAEROBIC Blood Culture adequate volume   Culture   Final    NO GROWTH 3 DAYS Performed at [facility], [address], [place], [place] [address]    Report Status PENDING  Incomplete  Blood culture (routine x 2)     Status: None (Preliminary result)   Collection  Time: 01/31/24  9:14 PM   Specimen: BLOOD LEFT ARM  Result Value Ref Range Status   Specimen Description BLOOD LEFT ARM  Final   Special Requests   Final    BOTTLES DRAWN AEROBIC AND ANAEROBIC Blood Culture adequate volume   Culture   Final    NO GROWTH 3 DAYS Performed at [facility], [address] [place], [place] [address]    Report Status PENDING  Incomplete  MRSA Next Gen by PCR, Nasal     Status: None   Collection Time: 02/02/24  4:30 AM   Specimen: Nasal Mucosa; Nasal Swab  Result Value Ref Range Status   MRSA by PCR Next Gen NOT DETECTED NOT DETECTED Final    Comment: (NOTE) The GeneXpert MRSA Assay (FDA approved for NASAL specimens only), is one component of a comprehensive MRSA colonization surveillance program. It is not intended to diagnose MRSA infection nor to guide or monitor treatment  for MRSA infections. Test performance is not FDA approved in patients less than 91 years old. Performed at [facility], [address], [place], [place] [address]   Urine Culture (for pregnant, neutropenic or urologic patients or patients with an indwelling urinary catheter)     Status: None   Collection Time: 02/02/24  4:48 AM   Specimen: Urine, Clean Catch  Result Value Ref Range Status   Specimen Description URINE, CLEAN CATCH  Final   Special Requests Normal  Final   Culture   Final    NO GROWTH Performed at [facility], [address], [place], [place] [address]    Report Status 02/03/2024 FINAL  Final  Culture, blood (Routine X 2) w Reflex to ID Panel     Status: None (Preliminary result)   Collection Time: 02/02/24  8:11 AM   Specimen: BLOOD RIGHT ARM  Result Value Ref Range Status   Specimen Description BLOOD RIGHT ARM  Final   Special Requests   Final    BOTTLES DRAWN AEROBIC AND ANAEROBIC Blood Culture results may not be optimal due to an inadequate volume of blood received in culture bottles   Culture   Final    NO GROWTH < 24 HOURS Performed at [facility], [address], [place], [place] [address]    Report Status PENDING  Incomplete  Culture, blood (Routine X 2) w Reflex to ID Panel     Status: None (Preliminary result)   Collection Time: 02/02/24  8:14 AM   Specimen: BLOOD RIGHT HAND  Result Value Ref Range Status   Specimen Description BLOOD RIGHT HAND  Final   Special Requests AEROBIC BOTTLE ONLY Blood Culture adequate volume  Final   Culture   Final    NO GROWTH < 24 HOURS Performed at [facility], [address], [place], [place] [address]    Report Status PENDING  Incomplete    Lab Basic Metabolic Panel: Recent Labs  Lab 01/31/24 2331 02/01/24 0430 02/02/24 0304 02/03/24 0906  NA 137 139 142 140  K 4.5 4.1 4.5 4.0  CL 103 105 106 107  CO2 27 27 21* 20*  GLUCOSE 101* 95 138* 106*  BUN 13 12 17  29*  CREATININE 1.02*  1.00 1.41* 1.42*  CALCIUM  8.4* 8.5* 8.3* 8.2*  MG  --  2.2  --  1.7   Liver Function Tests: Recent Labs  Lab 01/31/24 2331 02/01/24 0430  AST 52* 47*  ALT 52* 50*  ALKPHOS 101  97  BILITOT 0.8 0.8  PROT 5.2* 5.0*  ALBUMIN  2.7* 2.8*   Recent Labs  Lab 01/31/24 2331  LIPASE 18   Recent Labs  Lab 02/02/24 0811  AMMONIA 32   CBC: Recent Labs  Lab 01/31/24 2103 02/01/24 0430 02/02/24 0304 02/02/24 0811 02/03/24 0029  WBC 14.8* 12.9* 42.9* 41.8* 48.8*  NEUTROABS 11.8* 10.8*  --   --   --   HGB 10.6* 9.7* 9.8* 9.8* 9.7*  HCT 32.4* 29.5* 31.0* 30.3* 29.5*  MCV 93.1 93.1 96.3 95.9 93.1  PLT 461* 395 406* 409* 403*   Cardiac Enzymes: No results for input(s): CKTOTAL, CKMB, CKMBINDEX, TROPONINI in the last 168 hours. Sepsis Labs: Recent Labs  Lab 02/01/24 0007 02/01/24 0430 02/02/24 0304 02/02/24 0811 02/02/24 1049 02/03/24 0029  PROCALCITON  --  0.15  --   --   --   --   WBC  --  12.9* 42.9* 41.8*  --  48.8*  LATICACIDVEN 1.0  --  4.2* 2.9* 2.2*  --       Toribio JAYSON Sharps March 03, 2024, 12:57 AM   "

## 2024-02-05 DEATH — deceased

## 2024-02-07 LAB — CULTURE, BLOOD (ROUTINE X 2)
Culture: NO GROWTH
Culture: NO GROWTH
Special Requests: ADEQUATE

## 2024-06-21 ENCOUNTER — Ambulatory Visit (HOSPITAL_COMMUNITY): Admitting: Internal Medicine
